# Patient Record
Sex: Male | Born: 1935 | Race: White | Hispanic: No | Smoking: Former smoker
Health system: Southern US, Community
[De-identification: ages and names within clinical notes are randomized; demographics above are authoritative.]

## PROBLEM LIST (undated history)

## (undated) DIAGNOSIS — F1020 Alcohol dependence, uncomplicated: Secondary | ICD-10-CM

## (undated) DIAGNOSIS — E785 Hyperlipidemia, unspecified: Secondary | ICD-10-CM

## (undated) DIAGNOSIS — G8929 Other chronic pain: Secondary | ICD-10-CM

## (undated) DIAGNOSIS — K469 Unspecified abdominal hernia without obstruction or gangrene: Secondary | ICD-10-CM

## (undated) DIAGNOSIS — Z720 Tobacco use: Secondary | ICD-10-CM

## (undated) DIAGNOSIS — Z8679 Personal history of other diseases of the circulatory system: Secondary | ICD-10-CM

## (undated) DIAGNOSIS — Z950 Presence of cardiac pacemaker: Secondary | ICD-10-CM

## (undated) DIAGNOSIS — I739 Peripheral vascular disease, unspecified: Secondary | ICD-10-CM

## (undated) DIAGNOSIS — Z9889 Other specified postprocedural states: Secondary | ICD-10-CM

## (undated) DIAGNOSIS — Q600 Renal agenesis, unilateral: Secondary | ICD-10-CM

## (undated) DIAGNOSIS — L409 Psoriasis, unspecified: Secondary | ICD-10-CM

## (undated) DIAGNOSIS — M419 Scoliosis, unspecified: Secondary | ICD-10-CM

## (undated) DIAGNOSIS — M545 Low back pain, unspecified: Secondary | ICD-10-CM

## (undated) DIAGNOSIS — I10 Essential (primary) hypertension: Secondary | ICD-10-CM

## (undated) DIAGNOSIS — I712 Thoracic aortic aneurysm, without rupture: Secondary | ICD-10-CM

## (undated) DIAGNOSIS — F0390 Unspecified dementia without behavioral disturbance: Secondary | ICD-10-CM

## (undated) DIAGNOSIS — M1990 Unspecified osteoarthritis, unspecified site: Secondary | ICD-10-CM

## (undated) DIAGNOSIS — I4891 Unspecified atrial fibrillation: Secondary | ICD-10-CM

## (undated) DIAGNOSIS — N400 Enlarged prostate without lower urinary tract symptoms: Secondary | ICD-10-CM

## (undated) DIAGNOSIS — K219 Gastro-esophageal reflux disease without esophagitis: Secondary | ICD-10-CM

## (undated) DIAGNOSIS — I480 Paroxysmal atrial fibrillation: Secondary | ICD-10-CM

## (undated) DIAGNOSIS — I495 Sick sinus syndrome: Secondary | ICD-10-CM

## (undated) DIAGNOSIS — K649 Unspecified hemorrhoids: Secondary | ICD-10-CM

## (undated) DIAGNOSIS — H3530 Unspecified macular degeneration: Secondary | ICD-10-CM

## (undated) DIAGNOSIS — I2510 Atherosclerotic heart disease of native coronary artery without angina pectoris: Secondary | ICD-10-CM

## (undated) DIAGNOSIS — J449 Chronic obstructive pulmonary disease, unspecified: Secondary | ICD-10-CM

## (undated) HISTORY — DX: Presence of cardiac pacemaker: Z95.0

## (undated) HISTORY — DX: Personal history of other diseases of the circulatory system: Z86.79

## (undated) HISTORY — DX: Peripheral vascular disease, unspecified: I73.9

## (undated) HISTORY — PX: CORONARY ANGIOPLASTY WITH STENT PLACEMENT: SHX49

## (undated) HISTORY — DX: Gastro-esophageal reflux disease without esophagitis: K21.9

## (undated) HISTORY — DX: Chronic obstructive pulmonary disease, unspecified: J44.9

## (undated) HISTORY — DX: Essential (primary) hypertension: I10

## (undated) HISTORY — DX: Scoliosis, unspecified: M41.9

## (undated) HISTORY — DX: Unspecified abdominal hernia without obstruction or gangrene: K46.9

## (undated) HISTORY — DX: Thoracic aortic aneurysm, without rupture: I71.2

## (undated) HISTORY — DX: Renal agenesis, unilateral: Q60.0

## (undated) HISTORY — DX: Tobacco use: Z72.0

## (undated) HISTORY — PX: HERNIA REPAIR: SHX51

## (undated) HISTORY — DX: Unspecified atrial fibrillation: I48.91

## (undated) HISTORY — DX: Unspecified macular degeneration: H35.30

## (undated) HISTORY — DX: Hyperlipidemia, unspecified: E78.5

## (undated) HISTORY — DX: Unspecified osteoarthritis, unspecified site: M19.90

## (undated) HISTORY — PX: APPENDECTOMY: SHX54

## (undated) HISTORY — DX: Personal history of other diseases of the circulatory system: Z98.890

## (undated) HISTORY — DX: Alcohol dependence, uncomplicated: F10.20

## (undated) HISTORY — DX: Unspecified hemorrhoids: K64.9

## (undated) HISTORY — PX: CATARACT EXTRACTION, BILATERAL: SHX1313

## (undated) HISTORY — DX: Atherosclerotic heart disease of native coronary artery without angina pectoris: I25.10

---

## 2000-11-24 ENCOUNTER — Ambulatory Visit (HOSPITAL_COMMUNITY): Admission: RE | Admit: 2000-11-24 | Discharge: 2000-11-24 | Payer: Self-pay | Admitting: *Deleted

## 2000-11-24 ENCOUNTER — Encounter: Payer: Self-pay | Admitting: *Deleted

## 2002-03-31 ENCOUNTER — Inpatient Hospital Stay (HOSPITAL_COMMUNITY): Admission: EM | Admit: 2002-03-31 | Discharge: 2002-04-06 | Payer: Self-pay

## 2002-03-31 ENCOUNTER — Encounter: Payer: Self-pay | Admitting: Family Medicine

## 2002-04-13 ENCOUNTER — Encounter: Admission: RE | Admit: 2002-04-13 | Discharge: 2002-04-13 | Payer: Self-pay | Admitting: Family Medicine

## 2002-05-03 ENCOUNTER — Inpatient Hospital Stay (HOSPITAL_COMMUNITY): Admission: RE | Admit: 2002-05-03 | Discharge: 2002-05-09 | Payer: Self-pay | Admitting: Vascular Surgery

## 2002-05-03 ENCOUNTER — Encounter: Payer: Self-pay | Admitting: Vascular Surgery

## 2002-05-03 HISTORY — PX: OTHER SURGICAL HISTORY: SHX169

## 2002-05-04 ENCOUNTER — Encounter: Payer: Self-pay | Admitting: Vascular Surgery

## 2002-05-06 ENCOUNTER — Encounter: Payer: Self-pay | Admitting: Vascular Surgery

## 2002-05-11 ENCOUNTER — Inpatient Hospital Stay (HOSPITAL_COMMUNITY): Admission: EM | Admit: 2002-05-11 | Discharge: 2002-05-12 | Payer: Self-pay | Admitting: Emergency Medicine

## 2002-05-11 ENCOUNTER — Encounter: Payer: Self-pay | Admitting: Emergency Medicine

## 2002-07-29 ENCOUNTER — Ambulatory Visit (HOSPITAL_COMMUNITY): Admission: RE | Admit: 2002-07-29 | Discharge: 2002-07-29 | Payer: Self-pay | Admitting: Family Medicine

## 2002-07-29 ENCOUNTER — Encounter: Payer: Self-pay | Admitting: Family Medicine

## 2003-03-03 ENCOUNTER — Encounter (INDEPENDENT_AMBULATORY_CARE_PROVIDER_SITE_OTHER): Payer: Self-pay | Admitting: *Deleted

## 2003-03-03 ENCOUNTER — Ambulatory Visit (HOSPITAL_COMMUNITY): Admission: RE | Admit: 2003-03-03 | Discharge: 2003-03-03 | Payer: Self-pay | Admitting: General Surgery

## 2003-03-03 ENCOUNTER — Ambulatory Visit (HOSPITAL_BASED_OUTPATIENT_CLINIC_OR_DEPARTMENT_OTHER): Admission: RE | Admit: 2003-03-03 | Discharge: 2003-03-03 | Payer: Self-pay | Admitting: General Surgery

## 2003-07-21 ENCOUNTER — Encounter
Admission: RE | Admit: 2003-07-21 | Discharge: 2003-07-21 | Payer: Self-pay | Admitting: Thoracic Surgery (Cardiothoracic Vascular Surgery)

## 2003-08-10 ENCOUNTER — Ambulatory Visit (HOSPITAL_COMMUNITY): Admission: RE | Admit: 2003-08-10 | Discharge: 2003-08-10 | Payer: Self-pay | Admitting: *Deleted

## 2003-09-19 ENCOUNTER — Ambulatory Visit (HOSPITAL_BASED_OUTPATIENT_CLINIC_OR_DEPARTMENT_OTHER): Admission: RE | Admit: 2003-09-19 | Discharge: 2003-09-19 | Payer: Self-pay | Admitting: General Surgery

## 2003-09-19 ENCOUNTER — Ambulatory Visit (HOSPITAL_COMMUNITY): Admission: RE | Admit: 2003-09-19 | Discharge: 2003-09-19 | Payer: Self-pay | Admitting: General Surgery

## 2003-09-19 ENCOUNTER — Encounter (INDEPENDENT_AMBULATORY_CARE_PROVIDER_SITE_OTHER): Payer: Self-pay | Admitting: *Deleted

## 2004-01-27 ENCOUNTER — Encounter
Admission: RE | Admit: 2004-01-27 | Discharge: 2004-01-27 | Payer: Self-pay | Admitting: Thoracic Surgery (Cardiothoracic Vascular Surgery)

## 2004-06-06 ENCOUNTER — Ambulatory Visit: Payer: Self-pay | Admitting: Internal Medicine

## 2004-06-21 ENCOUNTER — Ambulatory Visit: Payer: Self-pay | Admitting: Internal Medicine

## 2004-09-13 ENCOUNTER — Ambulatory Visit: Payer: Self-pay | Admitting: Internal Medicine

## 2004-09-20 ENCOUNTER — Ambulatory Visit: Payer: Self-pay

## 2004-12-11 ENCOUNTER — Ambulatory Visit: Payer: Self-pay | Admitting: Internal Medicine

## 2004-12-18 ENCOUNTER — Ambulatory Visit: Payer: Self-pay | Admitting: Internal Medicine

## 2005-01-18 ENCOUNTER — Ambulatory Visit: Payer: Self-pay | Admitting: Internal Medicine

## 2005-01-31 ENCOUNTER — Encounter
Admission: RE | Admit: 2005-01-31 | Discharge: 2005-01-31 | Payer: Self-pay | Admitting: Thoracic Surgery (Cardiothoracic Vascular Surgery)

## 2005-05-07 ENCOUNTER — Ambulatory Visit: Payer: Self-pay | Admitting: Cardiovascular Disease

## 2005-06-07 ENCOUNTER — Ambulatory Visit: Payer: Self-pay | Admitting: Cardiology

## 2005-06-10 ENCOUNTER — Encounter: Payer: Self-pay | Admitting: Cardiology

## 2005-06-10 ENCOUNTER — Ambulatory Visit: Payer: Self-pay

## 2005-06-17 ENCOUNTER — Ambulatory Visit: Payer: Self-pay | Admitting: Internal Medicine

## 2005-10-24 ENCOUNTER — Ambulatory Visit: Payer: Self-pay | Admitting: Internal Medicine

## 2005-10-28 ENCOUNTER — Ambulatory Visit: Admission: RE | Admit: 2005-10-28 | Discharge: 2005-10-28 | Payer: Self-pay | Admitting: Internal Medicine

## 2005-11-22 ENCOUNTER — Ambulatory Visit: Payer: Self-pay | Admitting: Internal Medicine

## 2005-12-13 ENCOUNTER — Ambulatory Visit: Payer: Self-pay | Admitting: Internal Medicine

## 2006-02-06 ENCOUNTER — Ambulatory Visit: Payer: Self-pay | Admitting: Internal Medicine

## 2006-02-25 ENCOUNTER — Encounter
Admission: RE | Admit: 2006-02-25 | Discharge: 2006-02-25 | Payer: Self-pay | Admitting: Thoracic Surgery (Cardiothoracic Vascular Surgery)

## 2006-04-30 ENCOUNTER — Ambulatory Visit: Payer: Self-pay | Admitting: Cardiovascular Disease

## 2006-04-30 ENCOUNTER — Inpatient Hospital Stay (HOSPITAL_COMMUNITY): Admission: RE | Admit: 2006-04-30 | Discharge: 2006-05-05 | Payer: Self-pay | Admitting: General Surgery

## 2006-07-21 ENCOUNTER — Ambulatory Visit: Payer: Self-pay | Admitting: Internal Medicine

## 2007-03-02 ENCOUNTER — Ambulatory Visit: Payer: Self-pay | Admitting: Thoracic Surgery (Cardiothoracic Vascular Surgery)

## 2007-03-02 ENCOUNTER — Encounter
Admission: RE | Admit: 2007-03-02 | Discharge: 2007-03-02 | Payer: Self-pay | Admitting: Thoracic Surgery (Cardiothoracic Vascular Surgery)

## 2007-03-05 ENCOUNTER — Ambulatory Visit: Payer: Self-pay | Admitting: Internal Medicine

## 2007-09-07 ENCOUNTER — Ambulatory Visit: Payer: Self-pay | Admitting: Internal Medicine

## 2008-03-17 ENCOUNTER — Encounter
Admission: RE | Admit: 2008-03-17 | Discharge: 2008-03-17 | Payer: Self-pay | Admitting: Thoracic Surgery (Cardiothoracic Vascular Surgery)

## 2008-03-17 ENCOUNTER — Ambulatory Visit: Payer: Self-pay | Admitting: Thoracic Surgery (Cardiothoracic Vascular Surgery)

## 2008-04-18 DIAGNOSIS — K219 Gastro-esophageal reflux disease without esophagitis: Secondary | ICD-10-CM | POA: Insufficient documentation

## 2008-04-18 DIAGNOSIS — D126 Benign neoplasm of colon, unspecified: Secondary | ICD-10-CM

## 2008-04-18 DIAGNOSIS — I2510 Atherosclerotic heart disease of native coronary artery without angina pectoris: Secondary | ICD-10-CM

## 2008-04-18 DIAGNOSIS — I739 Peripheral vascular disease, unspecified: Secondary | ICD-10-CM | POA: Insufficient documentation

## 2008-04-18 DIAGNOSIS — K5730 Diverticulosis of large intestine without perforation or abscess without bleeding: Secondary | ICD-10-CM | POA: Insufficient documentation

## 2008-04-18 DIAGNOSIS — I10 Essential (primary) hypertension: Secondary | ICD-10-CM | POA: Insufficient documentation

## 2008-04-18 DIAGNOSIS — I714 Abdominal aortic aneurysm, without rupture: Secondary | ICD-10-CM

## 2008-04-19 ENCOUNTER — Ambulatory Visit: Payer: Self-pay | Admitting: Internal Medicine

## 2008-04-19 DIAGNOSIS — Z860100 Personal history of colon polyps, unspecified: Secondary | ICD-10-CM | POA: Insufficient documentation

## 2008-04-27 ENCOUNTER — Ambulatory Visit: Payer: Self-pay | Admitting: Internal Medicine

## 2008-05-19 ENCOUNTER — Ambulatory Visit: Payer: Self-pay | Admitting: Internal Medicine

## 2009-03-24 ENCOUNTER — Ambulatory Visit: Payer: Self-pay | Admitting: Internal Medicine

## 2009-03-28 ENCOUNTER — Ambulatory Visit: Payer: Self-pay | Admitting: Thoracic Surgery (Cardiothoracic Vascular Surgery)

## 2009-03-28 ENCOUNTER — Encounter
Admission: RE | Admit: 2009-03-28 | Discharge: 2009-03-28 | Payer: Self-pay | Admitting: Thoracic Surgery (Cardiothoracic Vascular Surgery)

## 2009-03-28 ENCOUNTER — Encounter: Payer: Self-pay | Admitting: Internal Medicine

## 2009-05-01 ENCOUNTER — Encounter: Payer: Self-pay | Admitting: Internal Medicine

## 2009-08-18 ENCOUNTER — Ambulatory Visit: Payer: Self-pay | Admitting: Internal Medicine

## 2009-10-27 ENCOUNTER — Telehealth: Payer: Self-pay | Admitting: Internal Medicine

## 2009-10-27 ENCOUNTER — Ambulatory Visit: Payer: Self-pay | Admitting: Internal Medicine

## 2009-10-27 DIAGNOSIS — R079 Chest pain, unspecified: Secondary | ICD-10-CM | POA: Insufficient documentation

## 2010-03-26 ENCOUNTER — Ambulatory Visit: Payer: Self-pay | Admitting: Thoracic Surgery (Cardiothoracic Vascular Surgery)

## 2010-03-26 ENCOUNTER — Encounter: Payer: Self-pay | Admitting: Internal Medicine

## 2010-03-26 ENCOUNTER — Encounter
Admission: RE | Admit: 2010-03-26 | Discharge: 2010-03-26 | Payer: Self-pay | Admitting: Thoracic Surgery (Cardiothoracic Vascular Surgery)

## 2010-06-19 NOTE — Assessment & Plan Note (Signed)
Summary: per check out/sf   Primary Provider:  Rudi Heap, MD  CC:  ROV; No Complaints.  History of Present Illness: patient is an 77 year old with a history of CAD (cath in 2006:  70 to 80% D1 and D2; 50% LCx; 50 to 60% RCA); dyslipidemia, DJD, PVOD (aortic aneurysm)  and COPD.  I saw him in November. Since seen he denies significan chest pain.  Breathing is unchanged, he does give out with activity. When I saw him last he was complaining of pain in legs with standing and low back pain.  He does not have this problem with walking.  Problems Prior to Update: 1)  Cad  (ICD-414.00) 2)  Aaa  (ICD-441.4) 3)  Dyslipidemia  (ICD-272.4) 4)  Hypertension  (ICD-401.9) 5)  Pvd  (ICD-443.9) 6)  Personal Hx Colonic Polyps  (ICD-V12.72) 7)  Adenomatous Colonic Polyp  (ICD-211.3) 8)  Diverticulosis, Colon  (ICD-562.10) 9)  Gerd  (ICD-530.81)  Current Medications (verified): 1)  Metoprolol Tartrate 25 Mg Tabs (Metoprolol Tartrate) .Marland Kitchen.. 1 Tablet By Mouth Once Daily 2)  Vytorin 10-40 Mg Tabs (Ezetimibe-Simvastatin) .Marland Kitchen.. 1 Tablet By Mouth Once Daily 3)  Furosemide 20 Mg Tabs (Furosemide) .Marland Kitchen.. 1 Tablet By Mouth Two Times A Day 4)  Amlodipine Besylate 5 Mg Tabs (Amlodipine Besylate) .Marland Kitchen.. 1 Tablet By Mouth Once Daily 5)  Klor-Con M20 20 Meq Cr-Tabs (Potassium Chloride Crys Cr) .Marland Kitchen.. 1 Tablet By Mouth Once Daily 6)  Plavix 75 Mg Tabs (Clopidogrel Bisulfate) .Marland Kitchen.. 1 Tablet By Mouth Once Daily 7)  Fish Oil 1000 Mg Caps (Omega-3 Fatty Acids) .Marland Kitchen.. 1 Tablet By Mouth Once Daily 8)  Isosorbide Mononitrate Cr 30 Mg Xr24h-Tab (Isosorbide Mononitrate) .... Take One Tablet By Mouth Daily 9)  Preservision Areds  Caps (Multiple Vitamins-Minerals) .... Take 1 By Mouth Two Times A Day  Allergies: 1)  ! Asa 2)  ! Keflex 3)  ! Lipitor  Past History:  Past medical, surgical, family and social histories (including risk factors) reviewed, and no changes noted (except as noted below).  Past Medical  History: Reviewed history from 04/18/2008 and no changes required. Current Problems:  ADENOMATOUS COLONIC POLYP (ICD-211.3) PVD (ICD-443.9) DYSLIPIDEMIA (ICD-272.4) HYPERTENSION (ICD-401.9) AAA (ICD-441.4) CAD (ICD-414.00) DIVERTICULOSIS, COLON (ICD-562.10) GERD (ICD-530.81)  Past Surgical History: Reviewed history from 04/19/2008 and no changes required. AAA repair Appendectomy  Family History: Reviewed history from 05/27/2008 and no changes required. Cerebrovascular dz in parents  Social History: Reviewed history from 04/19/2008 and no changes required. Married  Retired Patient is a former smoker.  Alcohol Use - no Daily Caffeine Use Illicit Drug Use - no  Vital Signs:  Patient profile:   77 year old male Height:      68 inches Weight:      208.75 pounds BMI:     31.86 Pulse rate:   53 / minute BP sitting:   138 / 76  (left arm)  Vitals Entered By: Stanton Kidney, EMT-P (August 18, 2009 9:36 AM)  Physical Exam  Additional Exam:  Patient is in NAD HEENT:  Normocephalic, atraumatic. EOMI, PERRLA.  Neck: JVP is normal. No thyromegaly. No bruits.  Lungs: clear to auscultation. No rales no wheezes.  Heart: Regular rate and rhythm. Normal S1, S2. No S3.   No significant murmurs. PMI not displaced.  Abdomen:  Supple, nontender. Normal bowel sounds. No masses. No hepatomegaly.  Extremities:   Good distal pulses throughout. No lower extremity edema.  Musculoskeletal :moving all extremities.  Neuro:   alert  and oriented x3.    Impression & Recommendations:  Problem # 1:  CAD (ICD-414.00) No symp to suggest worsing ischemia.  He hs chronic DOE, I am not impressed that it has changed.  I would continue meds.  Problem # 2:  AAA (ICD-441.4) Followed by S. Hendrickson.  Problem # 3:  DYSLIPIDEMIA (ICD-272.4) Excellent control with LDL of 56; HDL of 52. His updated medication list for this problem includes:    Vytorin 10-40 Mg Tabs (Ezetimibe-simvastatin) .Marland Kitchen... 1  tablet by mouth once daily  Problem # 4:  HYPERTENSION (ICD-401.9) Adequate control His updated medication list for this problem includes:    Metoprolol Tartrate 25 Mg Tabs (Metoprolol tartrate) .Marland Kitchen... 1 tablet by mouth once daily    Furosemide 20 Mg Tabs (Furosemide) .Marland Kitchen... 1 tablet by mouth two times a day    Amlodipine Besylate 5 Mg Tabs (Amlodipine besylate) .Marland Kitchen... 1 tablet by mouth once daily

## 2010-06-19 NOTE — Assessment & Plan Note (Signed)
Summary: Right upper chest/ shoulder and arm pain      Allergies Added:   Visit Type:  Follow-up Primary Provider:  Rudi Heap, MD  CC:  chest pain sob.  History of Present Illness: patient is an 77 year old with a history of CAD (cath in 2006:  70 to 80% D1 and D2; 50% LCx; 50 to 60% RCA); dyslipidemia, DJD, PVOD (aortic aneurysm)  and COPD.  I saw him in April of this year. The patient called in earlier today complaining of chest pain.  Per his report the pain is in the R chest anterior to axillar aand under axilla.  It is occasionally exacerbated by breathing.  He also notes some exacerbation wth movement.  No left sided chest pain.  Pain as gone on/off for several days. In addition the patient notes some numbens in the first three fingers of his R hand.  No neuro complaints elsewhere.  Current Medications (verified): 1)  Metoprolol Tartrate 25 Mg Tabs (Metoprolol Tartrate) .Marland Kitchen.. 1 Tablet By Mouth Once Daily 2)  Vytorin 10-40 Mg Tabs (Ezetimibe-Simvastatin) .Marland Kitchen.. 1 Tablet By Mouth Once Daily 3)  Furosemide 20 Mg Tabs (Furosemide) .Marland Kitchen.. 1 Tablet By Mouth Two Times A Day 4)  Amlodipine Besylate 5 Mg Tabs (Amlodipine Besylate) .Marland Kitchen.. 1 Tablet By Mouth Once Daily 5)  Klor-Con M20 20 Meq Cr-Tabs (Potassium Chloride Crys Cr) .Marland Kitchen.. 1 Tablet By Mouth Once Daily 6)  Plavix 75 Mg Tabs (Clopidogrel Bisulfate) .Marland Kitchen.. 1 Tablet By Mouth Once Daily 7)  Fish Oil 1000 Mg Caps (Omega-3 Fatty Acids) .Marland Kitchen.. 1 Tablet By Mouth Once Daily 8)  Isosorbide Mononitrate Cr 30 Mg Xr24h-Tab (Isosorbide Mononitrate) .... Take One Tablet By Mouth Daily 9)  Preservision Areds  Caps (Multiple Vitamins-Minerals) .... Take 1 By Mouth Two Times A Day  Allergies (verified): 1)  ! Asa 2)  ! Keflex 3)  ! Lipitor  Past History:  Past medical, surgical, family and social histories (including risk factors) reviewed, and no changes noted (except as noted below).  Past Medical History: Reviewed history from 04/18/2008 and no  changes required. Current Problems:  ADENOMATOUS COLONIC POLYP (ICD-211.3) PVD (ICD-443.9) DYSLIPIDEMIA (ICD-272.4) HYPERTENSION (ICD-401.9) AAA (ICD-441.4) CAD (ICD-414.00) DIVERTICULOSIS, COLON (ICD-562.10) GERD (ICD-530.81)  Past Surgical History: Reviewed history from 04/19/2008 and no changes required. AAA repair Appendectomy  Family History: Reviewed history from 05/27/2008 and no changes required. Cerebrovascular dz in parents  Social History: Reviewed history from 04/19/2008 and no changes required. Married  Retired Patient is a former smoker.  Alcohol Use - no Daily Caffeine Use Illicit Drug Use - no  Vital Signs:  Patient profile:   77 year old male Height:      68 inches Weight:      208 pounds Pulse rate:   64 / minute BP sitting:   123 / 67  (left arm) Cuff size:   regular  Vitals Entered By: Burnett Kanaris, CNA (October 27, 2009 11:05 AM)  Physical Exam  Additional Exam:  patient is in NAD HEENT:  Normocephalic, atraumatic. EOMI, PERRLA.  Neck: JVP is normal. No thyromegaly. No bruits.  Lungs: clear to auscultation. No rales no wheezes.  Heart: Regular rate and rhythm. Normal S1, S2. No S3.   No significant murmurs. PMI not displaced. Chest:  tneder palpitation .  Pain in right shoulder with movement.  Abdomen:  Supple, nontender. Normal bowel sounds. No masses. No hepatomegaly.  Extremities:   Good distal pulses throughout. No lower extremity edema.  Musculoskeletal :moving all  extremities.  Neuro:   alert and oriented x3.  Motor 5/5 throughout.  Decreased pinprick senstation first three fingers  R hand.   Rapid alternating movements intact.    Impression & Recommendations:  Problem # 1:  CHEST PAIN UNSPECIFIED (ICD-786.50) Patient's current chest pain appears to be musculoskeletal.  He may have strained in doing something. R hand numbness appears to be a peripheral defect, again may be associated with R shoulder injury. I recommended rest,  activities as tolerated. Chest xray today is without acute abnormality If his symptoms do not improve I have told him to call back in 2 wks.  Problem # 2:  CAD (ICD-414.00) No signs to sugg active angina.  Continue meds  Problem # 3:  DYSLIPIDEMIA (ICD-272.4) Continue meds. His updated medication list for this problem includes:    Vytorin 10-40 Mg Tabs (Ezetimibe-simvastatin) .Marland Kitchen... 1 tablet by mouth once daily  Other Orders: EKG w/ Interpretation (93000) T-2 View CXR (71020TC)  Patient Instructions: 1)  Your physician recommends that you schedule a follow-up appointment in:  2)  Your MD recommends for you to have a Chest xray today.  Appended Document: Right upper chest/ shoulder and arm pain EKG:  Sinus bradycardia.  57 bpm.  LAD.

## 2010-06-19 NOTE — Progress Notes (Signed)
Summary: pt having chest pain   Phone Note Call from Patient Call back at Home Phone [phone]   Caller: Patient Reason for Call: Talk to Nurse, Talk to Doctor Summary of Call: pt having chest pain and numbness on right side goes down to fingers no other symptoms Initial call taken by: Omer Jack,  October 27, 2009 8:06 AM  Follow-up for Phone Call        Spoke with pt. Patient c/o of numbnes tingling and pain on right upper chest shoulder down to arm. Pt. states he feels the pain with esch breath. Pt. states this been going on for a week. He would like to be seen today. An appointment was made for pt. at 11:45AM pt aware. Follow-up by: Ollen Gross, RN, BSN,  October 27, 2009 8:29 AM

## 2010-06-22 ENCOUNTER — Encounter: Payer: Self-pay | Admitting: Internal Medicine

## 2010-07-11 NOTE — Progress Notes (Signed)
Summary: [facility]: Office Visit  [facility]: Office Visit   Imported By: Earl Many 06/28/2010 17:19:27  _____________________________________________________________________  External Attachment:    Type:   Image     Comment:   External Document

## 2010-10-02 NOTE — Assessment & Plan Note (Signed)
OFFICE VISIT   BRITTEN, SEYFRIED  DOB:  01/11/1928                                        March 28, 2009  CHART #:  [id number]   HISTORY:  The patient is a 77 year old gentleman who returns for a 1-  year followup of his ascending aortic aneurysm.  This was found back in  2003.  He had an infrarenal and common iliac aneurysm repair by Dr.  Hart Rochester.  Chest x-ray showed a wide mediastinum.  A CT scan showed a 4.8-  cm aneurysm.  He has been followed with annual CT since then.  His most  recent one was in October 2009, at which point there was no interval  change.  It measured 5 x 4.5 at its largest point, although that may be  slightly off axis and slightly overestimating the size, it is certainly  within range.  He states that he did see Dr. [name] recently because he  had been having some swelling in his legs but that is now improved.  He  has not been having any chest pain.  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  His wife is having some significant health problems which is putting  some strain on him.   CURRENT MEDICATIONS:  1. Isosorbide 30 mg daily.  2. Amlodipine 5 mg daily.  3. Metoprolol 25 mg daily.  4. Plavix 75 mg daily.  5. Furosemide 20 mg daily.  6. Vytorin 10/40 one tablet daily.  7. TriCor 145 mg daily.  8. Klor-Con 20 mEq daily.   ALLERGIES:  He has allergies to aspirin and Keflex.   REVIEW OF SYSTEMS:  See HPI, otherwise negative.   PHYSICAL EXAMINATION:  General:  The patient is a 77 year old gentleman  in no acute distress.  Vital Signs:  Blood pressure 140/86, pulse 62,  respirations are 18, his oxygen saturation is 94% on room air.  Neurologic:  He is alert and oriented x3 with no focal deficits.  HEENT:  He is wearing glasses.  Otherwise, unremarkable.  Neck:  Without bruits.  Cardiac:  A regular rate and rhythm.  Normal S1 and S2.  There is a 2/6  systolic  murmur.  Lungs:  Equal breath sounds.  Abdomen:  Soft,  nontender without palpable pulsatile mass.  Extremities:  Trace edema.   DIAGNOSTIC TESTS:  His CT scan is stable.  There has been no change in  his aortic dilatation and atherosclerosis.  He has stable chronic lung  disease with no suspicious findings.   IMPRESSION:  The patient is a 77 year old gentleman followed for an  ascending aortic aneurysm.  He has had no evidence of progression with  this over a 7-year period.  We will continue to follow him with annual  scans.  No medication changes were made at this visit.    Salvatore Decent Dorris Fetch, M.D.  Electronically Signed   SCH/MEDQ  D:  03/28/2009  T:  03/28/2009  Job:  [id number]   cc:   [name], MD, [facility]  Ernestina Penna, M.D.  Quita Skye Hart Rochester, M.D.

## 2010-10-02 NOTE — Assessment & Plan Note (Signed)
[facility]                            CARDIOLOGY OFFICE NOTE   [name], Brad                      MRN:          [id number]  DATE:03/05/2007                            DOB:          10-23-1927    IDENTIFICATION:  Brad [name] is a 77 year old gentleman who I have  followed in clinic.  He has a history of CAD.  Last cardiac  catheterization in 2006 (50% LAD, 70-85% D1, 70-80% D2, 50% circumflex,  50-60% RCA).  Myoview at that time showed no evidence of ischemia or  scar.   I last saw the patient back in March.  In the interval, he says he has  been doing okay.  He is followed by Lindaann Pascal in the clinic.  He denies  chest pain.  Breathing is relatively unchanged.  He takes activities as  tolerated.   CURRENT MEDICATIONS:  1. Metoprolol 12.5 mg daily.  2. Vytorin 10/20 daily.  3. Furosemide 20 b.i.d.   PHYSICAL EXAMINATION:  Patient is in no distress.  Blood pressure is 126/78.  Pulse is 58.  Weight 213.  DICTATION ENDS  HERE.     [name], MD, [facility]  Electronically Signed    PVR/MedQ  DD: 03/05/2007  DT: 03/06/2007  Job #: [id number]

## 2010-10-02 NOTE — Assessment & Plan Note (Signed)
[facility]                            CARDIOLOGY OFFICE NOTE   Brad [name], Brad [name]                      MRN:          [id number]  DATE:03/05/2007                            DOB:          [birth year]    IDENTIFICATION:  The patient is a 77 year old with a history of CAD,  dyslipidemia, aortic aneurysm, COPD.  I last saw him back in March.   Since being seen he has been doing pretty well.  He takes activity as  tolerated.  He has some lower back problems and cannot walk as well.  He  was seen this week by Viviann Spare C. Dorris Fetch, M.D. for followup with his  ascending aortic aneurysm.  CT scan shows that this has not changed  significantly at 4.6 x 4.6-cm.   The patient denies chest pain, breathing is actually somewhat better  since he quit smoking.  He says he thinks the incentive spirometer that  he got post abdominal surgery has actually helped the most.   Note, he is being evaluated for cataract surgery by Dr. Lorin Picket and Dr.  Sheffield Slider.  This is planned in November.   CURRENT MEDICATIONS:  1. Metoprolol 12.5 daily.  2. Vytorin 10/20.  3. Lasix 20 b.i.d.  4. Plavix 75.  5. Isorbid 30.  6. Amlodipine 5.  7. Fish oil 1 gram.  8. Vitamin C.  9. TriCor 145.  10.Potassium.  11.Spiriva.   PHYSICAL EXAMINATION:  GENERAL:  The patient is in no distress.  VITAL SIGNS:  His blood pressure is 126/78, pulse is 58 and regular,  weight 213 - stable.  LUNGS:  Clear.  CARDIAC:  Regular rate and rhythm.  S1 S2.  No S3.  No significant  murmur.  ABDOMEN:  Obese.  Ventral hernia reduces.  EXTREMITIES:  No edema.   IMPRESSION:  1. Coronary artery disease.  He remains on Plavix because of his      aspirin allergy.  On cath in 2006, 50% left anterior descending      artery, 75-85% diagonal-1, 70-80% diagonal-2, 50% circumflex, 50-      60% right coronary artery.  A Myoview scan done in May 2006, showed      no ischemia.  Clinically the patient has been fairly  stable.  I do      not think there is any significant worsening in his coronary artery      disease and I think from a cardiac standpoint, he is at a low risk      for a major event with cataract surgery, okay to proceed without      further testing.  2. Dyslipidemia.  We will need to get in touch with [facility]      regarding his lipid values.  It has been followed up in there. We      will also see if he has had a recent B-MET given that he is on the      Lasix.  3. Peripheral vascular occlusive disease.  As per Dr. Dorris Fetch.  4. Chronic obstructive pulmonary disease.  I congratulate him again  for the smoking cessation.   I have encouraged him to stay as active as he.  He plans to start  fishing more.  I will set to see him back in 6 months.     [name], MD, [facility]  Electronically Signed    PVR/MedQ  DD: 03/05/2007  DT: 03/05/2007  Job #: [id number]   cc:   Lindaann Pascal  Dr. Lorin Picket, ophthalmologist  Allen Norris, M.D.

## 2010-10-02 NOTE — Assessment & Plan Note (Signed)
Brad [name]                            CARDIOLOGY OFFICE NOTE   Brad [name], Brad [name]                      MRN:          [id number]  DATE:05/19/2008                            DOB:          Sep 15, 1927    IDENTIFICATION:  [name] is a 77 year old gentleman who I followed  in the clinic, history of CAD, dyslipidemia, peripheral vascular  disease, and COPD.   He was last seen in April of this year.  Since seen, he has actually  been doing well.  He denies chest pain.  No significant shortness of  breath.   CURRENT MEDICATIONS:  1. Lasix 20 b.i.d.  2. Amlodipine 5 daily.  3. TriCor 145 daily.  4. Metoprolol 25 daily.  5. Vytorin 10/40 daily.  6. Klor-Con 20 mEq.  7. Plavix 75 daily.  8. Fish oil.  9. Vitamin C.   PHYSICAL EXAMINATION:  GENERAL:  The patient is in no distress.  VITAL SIGNS:  Blood pressure is 124/80, pulse is 58 and regular, weight  is 212 down from 215.  NECK:  No JVD.  LUNGS:  Clear without rales.  CARDIAC:  Regular rate and rhythm.  S1 and S2.  No murmurs.  ABDOMEN:  Benign, obese, status post ventral hernia repair.  EXTREMITIES:  No edema.   A 12-lead EKG shows sinus bradycardia at 58 beats per minute with  occasional PACs.  Left anterior fascicular block.   Labs from May 04, 2008, LipoMed panel, LDL of 74 with particle  number of 1147, HDL 42, triglycerides of 39, and total cholesterol 124.   IMPRESSION:  1. Coronary artery disease.  Last catheterization in 2006 (70-85% D1;      70-80% D2; 50% circumflex; 50-60% right coronary artery).  Myoview      scan showed no ischemia.  Remaining stable clinically.  We would      continue on medical therapy.  2. Dyslipidemia.  I have based on his lipids recommended that he stop      the TriCor.  We will follow up in 3 months.  3. Peripheral vascular disease followed by Dr. Dorris Fetch.  4. History of chronic obstructive pulmonary disease, appears to be      doing  fairly well.   I will set to see the patient back in the fall, sooner if problems  develop.  I will be in touch with him regarding his blood work.     [name], MD, [facility]  Electronically Signed    PVR/MedQ  DD: 05/21/2008  DT: 05/22/2008  Job #: [id number]   cc:   [facility]

## 2010-10-02 NOTE — Assessment & Plan Note (Signed)
OFFICE VISIT   Brad [name], Brad [name]  DOB:  1928/05/18                                        March 26, 2010  CHART #:  [id number]   HISTORY:  The patient is an 77 year old gentleman who we have been  following for an ascending aortic aneurysm, been seeing him yearly since  it was first discovered in 2003.  He was last seen in the office last  November, at which time there had been no interval change in his  aneurysm.  He states that overall he has been doing well.  He has not  had any real chest pain.  He has been having trouble.  He gets a little  winded when he tries to wait, so he has not been able to fish as much,  but overall he says he has been fairly stable.  He saw Dr. [name] back in  the summer and said that she did not have any concerns at that time.   His son does note that he felt a dizzy after his CT scan today but that  he has not been having any issues with dizziness prior to that scan.   PAST MEDICAL HISTORY:  Significant for hypertension ascending aortic  aneurysm, chronic obstructive pulmonary disease, history of tobacco  abuse, repair of abdominal and right common iliac aneurysm by Dr. Hart Rochester  in 2005.   CURRENT MEDICATIONS:  1. Imdur 30 mg daily.  2. Metoprolol 25 mg daily.  3. Plavix 75 mg daily.  4. Furosemide 20 mg daily.  5. Klor-Con 20 mEq daily.  6. PreserVision.  7. Vitamin one daily.  8. Crestor 10 mg daily.  9. Vitamin D3 1000 units daily.  10.Fish oil 1200 mg daily.   ALLERGIES:  He has allergies to aspirin and Keflex.   REVIEW OF SYSTEMS:  See HPI, otherwise negative.   PHYSICAL EXAMINATION:  The patient is an 77 year old gentleman in no  acute distress.  He is overweight.  His blood pressure is 116/70, pulse  70, respirations are 16, oxygen saturation is 93% on room air.  Neurologically, he is alert and oriented x3 with no focal deficits.  His  HEENT exam is unremarkable.  Neck is without bruits.  His lungs have  diminished breath sounds bilaterally.  Cardiac exam has a regular rate  and rhythm.  There is a 2/6 systolic murmur.  No rubs or gallops.   DIAGNOSTIC TESTS:  CT of the chest was performed and has not been  officially read yet.  The ascending aortic aneurysm appears relatively  stable.  It could possibly be enlarged by a millimeter or two but still  remains less than 5 cm.   IMPRESSION:  The patient is an 77 year old gentleman with an ascending  aortic aneurysm.  This has been followed for about 8 years now.  There  is no evidence of significant enlargement and no indication for surgery.  At this time, I would recommend repeat imaging in 1 year.  I did  recommend to do MR angio instead a CT angio, but the patient prefers to  continue with CT scans.   Salvatore Decent Dorris Fetch, M.D.  Electronically Signed   SCH/MEDQ  D:  03/26/2010  T:  03/27/2010  Job:  [id number]   cc:   [name], MD, [facility]

## 2010-10-02 NOTE — Assessment & Plan Note (Signed)
OFFICE VISIT   ORPHEUS, HAYHURST  DOB:  [birth year]                                        March 17, 2008  CHART #:  [id number]   The patient is a 77 year old gentleman.  He returns now for his 1-year  annual followup of an aneurysmal ascending aorta.  This was 4.8 cm in  2003.  He was having a repair of an infrarenal abdominal aneurysm and  common iliac aneurysm by Dr. Hart Rochester.  We have been following him with  scans since that time and it has remained stable since.  The patient  states that since his last visit, his breathing is about the same as his  back pain.  He still enjoys fly fishing on a regular basis that is about  the extent of his activities, but that has been that way for several  years.  He has been feeling well.  He has not been having any chest pain  or any unusual breathing symptoms.   CURRENT MEDICATIONS:  Isosorbide 30 mg daily, amlodipine 5 mg daily,  metoprolol 25 mg daily, Plavix 75 mg daily, Lasix 20 mg daily, Vytorin  10/40 one tablet daily, TriCor 145 mg daily, and Klor-Con 20 mEq daily.   ALLERGIES:  He has allergies to aspirin and Keflex.   The past medical history, review of systems, family and social history  reviewed on the patient's medical record form, which is on the chart.   PHYSICAL EXAMINATION:  GENERAL:  The patient is a well-appearing 80-year-  old white male, in no acute distress.  VITAL SIGNS:  His blood pressure is 128/78, pulse 67, respirations 20,  and his oxygen saturation 94% on room air.  No further physical exam was performed as I am recovering from a  respiratory illness.   CT scan is reviewed and compared to his film.  From the last several  years, there has been no interval change in the appearance of his  ascending aorta still approximately 5 cm in largest diameter of 5 x 4.5  at the largest point, which is unchanged from a year ago.   IMPRESSION:  The patient has a stable aneurysmal ascending aorta,  this  is unchanged since 2003.  There is no indication for surgery at this  time.  There is a risk of surgery essentially equivalent to his risk of  continued followup.  We will plan to see the patient back in 1 year to  check on his status at that time.  Obviously, we would be happy to see  him sooner, if there is any need and he knows to contact the EMS  immediately, if he were to have any severe chest pain.   Salvatore Decent Dorris Fetch, M.D.  Electronically Signed   SCH/MEDQ  D:  03/17/2008  T:  03/17/2008  Job:  [id number]   cc:   [name], MD, [facility]  Ernestina Penna, M.D.

## 2010-10-02 NOTE — Assessment & Plan Note (Signed)
[facility]                            CARDIOLOGY OFFICE NOTE   MORGON, PAMER                      MRN:          [id number]  DATE:09/07/2007                            DOB:          20-Mar-1928    IDENTIFICATION:  Brad [name] is a 77 year old gentleman with a history  of coronary artery disease.  I last saw him in clinic back in October.   Since seen, he has done well.  He actually says it is the best he has  felt in two years.  He denies chest pain.  He has been active, fishing,  no significant shortness of breath.   CURRENT MEDICINES:  1. Lasix 20 twice a day.  2. Amlodipine 5.  3. Tricor 145 alternating with Triplix when he can get samples.  4. Metoprolol 25.  5. Vytorin 10/40.  6. Klor-Con 20.  7. Plavix 75.  8. Fish oil.  9. Red yeast rice.  10.Vitamin C.   PHYSICAL EXAMINATION:  GENERAL APPEARANCE:  The patient is in no  distress.  VITAL SIGNS:  Blood pressure is 114/70, pulse is 56 and regular, weight  215 which is up 2 pounds from October.  LUNGS:  Clear.  CARDIAC:  Regular rate rhythm, S1, S2, no S3, no significant murmurs.  ABDOMEN:  Benign, obese with ventral hernia repair, still some bulging.  EXTREMITIES:  No edema.   IMPRESSION:  1. Coronary artery disease, clinically appears to be stable.  Last      catheterization in 2006 showed 75-85 first diagonal, 70-80% second      diagonal, 50% circumflex, 50-60% right coronary artery.  Myoview      showed no ischemia.  Again, he has remained stable clinically.      would continue medical therapy.  2. Dyslipidemia.  Last labs from earlier this year, LDL of 64, HDL of      45.  Would continue on current regimen.  Triglycerides of 45.  3. Peripheral vascular disease.  Followed by Dr. Dorris Fetch.  4. History of chronic obstructive pulmonary disease.  Appears to be      stable.   I will set to see the patient back in the winter, December or January,  sooner if problems  develop.     [name], MD, [facility]  Electronically Signed    PVR/MedQ  DD: 09/07/2007  DT: 09/07/2007  Job #: [id number]   cc:   [facility]

## 2010-10-02 NOTE — Assessment & Plan Note (Signed)
OFFICE VISIT   Brad [name], Brad [name]  DOB:  1928-02-02                                        March 02, 2007  CHART #:  [id number]   HISTORY:  The patient is a 77 year old gentleman who presents for a 1-  year followup of an aneurysmal ascending aorta.  He has had previous  treatment of infrarenal abdominal aneurysm and common iliac aneurysm by  Dr. Hart [name] in 2003.  At that time, his ascending aorta was noted to be  enlarged with a diameter of approximately 4.8-cm.  He had been followed  with serial repeat CT scans.  He was last seen in October 2007, at which  time the aneurysm remained stable.   In the interim since his last visit, he has had a ventral hernia repair  by Dr. Carolynne [name].  And, he says that after that he quit smoking.  He also  quit drinking.  He said quitting the smoking has been much more  difficult and he has gained some weight since then.  His exercise is  still limited.  He has significant back pain.  He still fishes on a  regular basis.  He says he cannot wade as far as he used to or cast as  far as he used to be able to but still enjoys it.   The patient's medical history form is reviewed and is on the chart.  His  past medical history is noted there.   CURRENT MEDICATIONS:  1. Lasix 20 mg b.i.d.  2. Imdur 30 mg daily.  3. Toprol XL 12.5 mg daily.  4. Plavix 75 mg daily.  5. Klor-Con 20 mEq daily.  6. Amlodipine 5 mg daily.  7. TriCor 145 mg daily.  8. Vytorin 10/20, one tablet daily.   REVIEW OF SYSTEMS:  RESPIRATORY:  He says his breathing is much better.  CHEST:  He has not had any chest pain.  No anginal type chest pain.  EXTREMITIES:  He does have some swelling in his legs but he will lie  down and elevate his legs for about an hour during the middle of the day  each day and that decreases the amount of edema in his lower  extremities.  NEUROLOGIC:  He has not had any stroke or TIA symptoms.   PHYSICAL EXAMINATION:  General:   The patient is a 77 year old white male  in no acute distress.  Vital signs:  His blood pressure is 143/82, pulse  is 70 and regular.  Respirations are 20.  His oxygen saturation is 96%  on room air.  Neurologic:  He is alert and oriented x3.  No focal  deficits.  His neck is supple without thyromegaly, adenopathy, or  bruits.  Cardiac:  Has a regular rate and rhythm.  Normal S1 and S2.  I  do not hear a murmur.  Lungs:  Distant breath sounds bilaterally.  There  is no wheezing.  Extremities:  Have trace pitting edema.   LABORATORY DATA:  His chest CT from today shows stable fusiform aneurysm  of the ascending aorta at 4.6 x 4.8-cm in diameter.  There is no change  in coronary artery calcification.  There is no also no change in the low  attenuation nodule on the right lobe of his thyroid and no change in  some scarring in his  right middle lobe.  No findings and stable aneurysm  is the summary.   IMPRESSION:  The patient is a 77 year old gentleman.  He has now been  followed for 5 years for an ascending aortic aneurysm.  This has not  changed during that time.  The biggest change since his visit a year ago  is that he has quit smoking and has noted improvement in his pulmonary  function.  He still has some limitations and some shortness of breath  but it is better than it was a year ago.  I have encouraged him to get  on a regular exercise program even though he really will not be able to  do very much to start with, if he would gradually build up it could make  a big difference for him and help him loose some of the weight he has  gained since he stopped smoking.  His blood pressure is reasonably  controlled.  He has an appointment with Dr. Tenny [name] next week.  We will not  make any changes to his medication regimen at this time.   Brad [name], M.D.  Electronically Signed   SCH/MEDQ  D:  03/02/2007  T:  03/02/2007  Job:  [id number]   cc:   Brad Sabal, MD  Brad Riffle, MD,  [name]  Brad Cage, MD  Brad [name]. Brad [name], M.D.

## 2010-10-05 NOTE — Assessment & Plan Note (Signed)
[facility]                              CARDIOLOGY OFFICE NOTE   Brad [name], GOODELL                      MRN:          [id number]  DATE:02/06/2006                            DOB:          Oct 17, 1927    IDENTIFICATION:  [name] is a 77 year old gentleman with a history of  CAD.  I last saw him in cardiology clinic back in June.   INTERVAL HISTORY:  Since being seen, his biggest complaint is the increasing  size of his ventral hernia.  He is being evaluated by Dr. Carolynne Edouard for possible  surgery with reduction.   This summer, he has been seen by Sandrea Hughs, M.D.  He was discharged for  p.r.n. followup.  He felt he had relatively high level lung function that  should not affect him in the future.   The patient denies chest pain.  His breathing is unchanged.  His activity is  as tolerated.  He has some shortness of breath, but again, no worsening.   CURRENT MEDICATIONS:  1. Plavix 75 mg daily.  2. Isosorbide 30 mg daily.  3. Amlodipine 5 mg daily.  4. Fish oil daily.  5. Vitamin C daily.  6. TriCor 145 mg daily.  7. Crestor 10 mg daily.  8. Toprol-XL 12.5 mg daily.  9. Lasix 20 mg daily.  10.Potassium 20 mEq daily.  11.Advair Diskus discontinued.  12.Spiriva daily.   PHYSICAL EXAMINATION:  GENERAL:  The patient is in no distress.  VITAL SIGNS:  Blood pressure 122/82, pulse 63, weight 220 pounds, up from  217 in June.  LUNGS:  Moving air, somewhat decreased.  CARDIAC:  Regular rate and rhythm.  S1 and S2.  No S3.  No significant  murmurs.  ABDOMEN:  Large ventral hernia present that reduces.  Somewhat larger than  when last seen.  EXTREMITIES:  Trace edema.   ELECTROCARDIOGRAM:  12-lead EKG:  Sinus rhythm, 63 beats per minute, left  axis deviation.   IMPRESSION/RECOMMENDATIONS:  1. Coronary artery disease.  Last cardiac catheterization was back in 2006      (50% left anterior descending lesion; 75-85% diagonal-1 lesion; 70-80%     diagonal-2 lesion; 50% left circumflex; 50-60% right coronary artery).      Myoview scan in May (dobutamine Myoview) showed no signs of scar or      ischemia.  Left ventricular ejection fraction was 60%.  Overall, I      think the patient has been stable clinically with no signs of      increasing ischemia or anginal equivalent.  I think he is overall a low      risk for proceeding with the above surgery.  Does not require any      further testing.  Of note, the patient is being followed by Dr.      Dorris Fetch for a thoracic aneurysm and is soon to have a repeat CT      done.  I recommended contacting his office also before the planned      surgery.  2. Dyslipidemia.  Last lipid panel was over a  year ago.  We will need to      get a fasting lipid at his convenience.  3. Chronic obstructive pulmonary disease on inhalers.  He had been seen in      pulmonary with no marked change.   I will set to see the patient back in about 6 months.  Of course, sooner if  problems develop.  Again, fasting lipids.                                [name], MD, [facility]    PVR/MedQ  DD:  02/07/2006  DT:  02/10/2006  Job #:  [id number]

## 2010-10-05 NOTE — Consult Note (Signed)
NAME:  Brad [name], Brad [name] NO.:  [id number]   MEDICAL RECORD NO.:  [id number]          PATIENT TYPE:  INP   LOCATION:  3316                         FACILITY:  [facility]   PHYSICIAN:  Veverly Fells. Excell Seltzer, MD  DATE OF BIRTH:  Feb 01, 1928   DATE OF CONSULTATION:  05/03/2006  DATE OF DISCHARGE:                                 CONSULTATION   REQUESTING PHYSICIAN:  Chevis Pretty, III.   PRIMARY CARDIOLOGIST:  Dietrich Pates.   REASON FOR CONSULTATION:  Patient is a 77 year old male, postoperative  day number 2 following ventral hernia repair with postoperative  shortness of breath.   HISTORY OF PRESENT ILLNESS:  [name] is a delightful 77 year old  man who just underwent hernia repair of a large ventral hernia.  He has  chronic dyspnea with modest activity.  He is followed for chronic  obstructive pulmonary disease on a long-term basis, and his dyspnea has  been attributed to this in the past.  He notes that since surgery his  shortness of breath is moderately worse than his baseline.  He denies  orthopnea, PND, or chest discomfort.  He complains of abdominal soreness  but has no other acute complaints today.   His past medical history is pertinent for the following:  1. Coronary artery disease.  He is status post previous obtuse      marginal stenting.  His last catheterization in 2005 demonstrated      moderate 2-vessel coronary artery disease.  Medical therapy was      recommended at the time, and the patient has done well with medical      therapy.  He reports no angina.  His last Myoview scan was in May      of this year, and it demonstrated no ischemia.  2. COPD.  3. Essential hypertension.  4. Congenital solitary kidney.  5. Gastroesophageal reflux disease.  6. History of triple A repair.  7. History of appendectomy.  8. Left inguinal hernia repair in 2005.   SOCIAL HISTORY:  The patient lives in [place] with his wife.  He  has a long-standing history of  tobacco but quit smoking in 2003.  He has  not drank alcohol in several years.  He is retired from the [employer].   FAMILY HISTORY:  Mother died at age 66 of a stroke.  His father died in  his 40s of a stroke.   Home medications include:  1. Potassium chloride 20 mEq daily.  2. Crestor 10 mg daily.  3. Plavix 75 mg daily.  4. Toprol XL 25 mg daily.  5. Tricor 145 mg daily.  6. Norvasc 5 mg daily.  7. Imdur 30 mg daily.  8. Lasix 20 mg twice daily.  9. Vitamin C 500 mg daily.  10.Fish oil 2000 mg daily.   ALLERGIES:  ASPIRIN CAUSES SWELLING.  ALSO ALLERGIC TO KEFLEX AND  LIPITOR.   REVIEW OF SYSTEMS:  Complete 12-point review of systems was performed.  Pertinent positives included 20-pound weight gain over the past year.  Patient is edentulous and wears dentures.  He wears  glasses for his  vision.  Chronic dyspnea as described above.  Also has a chronic  nonproductive cough.  He has chronic shoulder, hip, and back pain  secondary to arthritis as well as gastroesophageal reflux disease.  All  of his systems were reviewed and were negative except as described  above.   PHYSICAL EXAMINATION:  The patient is alert and oriented.  He is an  obese male in no acute distress.  Temperature is 97.4.  Heart rate is 83.  Respiratory rate is 18.  Blood  pressure 129/64.  Oxygen saturation is 91% on 3 liters of oxygen per  nasal cannula.  HEENT is normal.  NECK:  Normal carotid upstrokes without bruits.  Jugular venous pressure  is normal.  There is no thyromegaly or thyroid nodules.  Lymphatics:  There is no adenopathy.  LUNGS:  There are decreased breath sounds throughout.  There are no  rales present.  CARDIOVASCULAR:  The apex is not palpable.  The heart is regular rate  and rhythm.  There are no murmurs or gallops.  Abdomen is soft, obese.  There is mild diffuse tenderness.  There is a  dressing in place over the anterior aspect of the abdomen.  EXTREMITIES:  There is no  clubbing, cyanosis, or edema.  Peripheral  pulses are 2+ and equal throughout.  MUSCULOSKELETAL:  There is no joint deformity present.  NEUROLOGIC:  Alert and oriented to all spheres.  Cranial nerves II-XII  are intact.  Strength is 5/5 in the arms and legs.  Chest x-ray performed December 10th showed chronic bronchitic changes  with an ectatic thoracic aorta.  There was no acute disease described.  Followup chest x-ray December 13th showed subsegmental atelectasis in  the left lower lobe.  EKG performed on December 10th shows normal sinus rhythm with left axis  deviation. There are no ST segment or T wave changes.  LABORATORY DATA:  Hemoglobin 13.9; platelets 266,000; creatinine is 1.3;  potassium is 4.0; BNP is 50.  Echocardiogram performed in January of this year demonstrates normal  left ventricular function with a mildly calcified aortic valve, mild  aortic root dilatation, mild to moderate mitral annular calcium, and  right atrial dilatation.   ASSESSMENT:  This is a 77 year old male, postoperative day number 2  after ventral hernia repair.  His current problems are as follows:  1. Dyspnea.  At this point, there are no clinical lab or radiographic      signs of congestive heart failure.  Sometimes, the BNP can be      normal in obese patients, despite CHF.  However, I do not think      there is a substantial component of heart failure in the case of      [name].  I will say that his body habitus makes his exam      somewhat difficult.  I think it is reasonable to try a single      intravenous dose of IV furosemide to see if he has some clinical      response to this.  Will give him 1 dose of 40 mg of IV Lasix and      then recommend continuation of his home daily dose of Lasix.  My      suspicion is that his shortness of breath is secondary to his      underlying lung disease as well as postoperative abdominal      discomfort. 2. Coronary artery disease.  He is  stable.   Recommend continuing his      current therapy.  There are no clinical signs of ischemia.   Thank you for this consultation on this very nice gentleman.  Please  feel free to call at any time with questions.   Sincerely,   Veverly Fells. Tonny Bollman D. Excell Seltzer, MD  Electronically Signed     MDC/MEDQ  D:  05/02/2006  T:  05/03/2006  Job:  [id number]   cc:   [name], MD, [facility]  [facility] [place]

## 2010-10-05 NOTE — Discharge Summary (Signed)
NAME:  Brad [name], Brad [name] NO.:  [id number]   MEDICAL RECORD NO.:  [id number]          PATIENT TYPE:  INP   LOCATION:  5703                         FACILITY:  [facility]   PHYSICIAN:  Ollen Gross. Vernell Morgans, M.D. DATE OF BIRTH:  02-02-1928   DATE OF ADMISSION:  04/30/2006  DATE OF DISCHARGE:  05/05/2006                               DISCHARGE SUMMARY   [name] is a 77 year old gentleman who has had previous abdominal  surgery before.  He had a ventral hernia.  He was brought to the OR on  April 30, 2006 for a laparoscopic ventral hernia repair with mesh.  He tolerated this very well.  He did have a history of coronary artery  disease, so postoperatively he was monitored in the step-down unit.  On  the first and second postoperative day, his volume status was a little  bit tenuous.  Cardiology was consulted to help [place] manage him.  His other  main postoperative problem was just with pain control.  Cardiology  managed his postoperative shortness of breath, and he continued to  improve.  By May 05, 2006, he was tolerating diet, his bowels were  moving, his pain was under better control, and he was ready for  discharge home.   DISCHARGE MEDICATIONS:  He was to resume his home medications.  He was  given a prescription for Vicodin for pain.   ACTIVITY:  No heavy lifting.   DIET:  As tolerated.   FINAL DIAGNOSIS:  Ventral hernia.   FOLLOW UP:  Follow up will be with Dr. Carolynne Edouard in two weeks.   CONDITION ON DISCHARGE:  Stable, and he is discharged home.      Ollen Gross. Vernell Morgans, M.D.  Electronically Signed     PST/MEDQ  D:  07/01/2006  T:  07/01/2006  Job:  [id number]

## 2010-10-05 NOTE — Op Note (Signed)
NAME:  Brad [name], Brad [name]                         ACCOUNT NO.:  [id number]   MEDICAL RECORD NO.:  [id number]                   PATIENT TYPE:  AMB   LOCATION:  DSC                                  FACILITY:  [facility]   PHYSICIAN:  Ollen Gross. Vernell Morgans, M.D.              DATE OF BIRTH:  02-21-1928   DATE OF PROCEDURE:  09/19/2003  DATE OF DISCHARGE:  09/19/2003                                 OPERATIVE REPORT   PREOPERATIVE DIAGNOSES:  Left inguinal hernia.   POSTOPERATIVE DIAGNOSES:  Left direct and indirect inguinal hernia.   OPERATION PERFORMED:  Left inguinal hernia repair with mesh.   SURGEON:  Ollen Gross. Carolynne Edouard, M.D.   ANESTHESIA:  General endotracheal.   DESCRIPTION OF PROCEDURE:  After informed consent was obtained, the patient  was brought to the operating room and placed in supine position on the  operating table.  After adequate induction of general anesthesia, the  patient's left abdomen and groin were prepped with Betadine and draped in  the usual sterile manner.  A small incision was made from the edge of the  pubic tubercle towards the anterior superior iliac spine on the left with a  15 blade knife.  This incision was carried down through the skin and  subcutaneous tissue sharply using the electrocautery.  A small bridging vein  was encountered that was clamped with hemostats, divided and ligated with 3-  0 silk ties.  The incision was then carried down sharply through the rest of  the subcutaneous tissue until the fascia of the external oblique was  encountered.  This fascial layer was opened along its fibers towards the  apex of the external ring and a Weitlaner retractor was used to retract the  fascial layer superiorly and inferiorly.  Care was taken to identify the  ilioinguinal nerve and the cord structures were then surrounded by blunt  finger dissection between two fingers at the edge of the pubic tubercle.  A  half-inch Penrose drain was then placed around the cord  structures for  retraction purposes.  The cord structures were then skeletonized taking care  to avoid injury to the ilioinguinal nerve.  A small hernia sac was able to  be identified.  This was gently separated from the rest of the cord  structures by blunt dissection with a hemostat and sharp dissection with the  electrocautery.  Once it was freed it was opened with the Metzenbaum  scissors.  There were no visceral contents within the hernia sac.  The sac  was then ligated near its base with 2-0 silk suture ligature and the sac was  excised and sent to pathology for identification.  The sac was then allowed  to retract back beneath the transversalis layer.  Also medial to this there  was a broad based defect in the floor of the inguinal canal that was noted  with some bulging.  This was  repaired with interrupted 2-0 Vicryl stitches.  A 3 x 6 piece of Prolene mesh was then cut to fit and tails were cut  laterally in the mesh.  The mesh was sewed inferiorly to the shelving edge  of the inguinal ligament using a running 2-0 Prolene stitch.  The mesh was  then sewed superiorly to the muscular aponeurotic fascial layer of the  transversalis using interrupted 2-0 Prolene vertical mattress stitches.  The  tails of the mesh were wrapped around the cord structures and anchored  laterally to the shelving edge of the inguinal ligament using an interrupted  2-0 Prolene stitch.  Once this was accomplished, the mesh appeared to be in  good position without any tension.  The wound was then irrigated with  copious amounts of saline.  The external oblique was then reapproximated  with a running 2-0 Vicryl stitch.  The wound was then infiltrated with 30 mL  of 0.25% Marcaine.  The subcutaneous fascia was closed with a running 3-0  Vicryl stitch.  The skin was closed with a running 4-0 Monocryl subcuticular stitch.  Benzoin, Steri-Strips and sterile dressings were applied.  The patient  tolerated the  procedure well.  At the end of the case all sponge, needle and  instrument counts were correct.  The patient was awakened and taken to the  recovery room in stable condition.                                               Ollen Gross. Vernell Morgans, M.D.    PST/MEDQ  D:  09/25/2003  T:  09/26/2003  Job:  [id number]

## 2010-10-05 NOTE — Letter (Signed)
March 14, 2006     Brad [name]. Carolynne Edouard, M.D.  [address]  [place], [place] [address]   RE:  [name], [name]  MRN:  [id number]  /  DOB:  [birth year]   Dear. Dr. Carolynne Edouard,   This is a letter regarding Brad [name].  He is a patient I follow in the  Cardiology Clinic.  He is a 77 year old gentleman with CAD.  I last saw him  back in September.   When I saw him, he was doing well, without signs of active angina.  Note, he  had a Myoview scan in May that showed no evidence of scar or ischemia.  He  does not known disease.  LVF was normal.  Overall, I thought he was stable  clinically and was at low risk for proceeding with ventral hernia surgery.   Again, I would recommend continuing medical therapy throughout.  He is on a  low-dose beta-blocker.  Hopefully this can be continued throughout the  perioperative period.  I do not think he is in need of any further testing.    Sincerely,     ______________________________  [name], MD, [facility]    PVR/MedQ  DD: 03/14/2006  DT: 03/17/2006  Job #: [id number]

## 2010-10-05 NOTE — Op Note (Signed)
   NAME:  Brad [name], Brad [name]                         ACCOUNT NO.:  [id number]   MEDICAL RECORD NO.:  [id number]                   PATIENT TYPE:  AMB   LOCATION:  DSC                                  FACILITY:  [facility]   PHYSICIAN:  Ollen Gross. Vernell Morgans, M.D.              DATE OF BIRTH:  05-07-1928   DATE OF PROCEDURE:  03/03/2003  DATE OF DISCHARGE:  03/03/2003                                 OPERATIVE REPORT   PREOPERATIVE DIAGNOSIS:  Lipoma of the back of the neck.   POSTOPERATIVE DIAGNOSIS:  Lipoma of the back of the neck.   OPERATION/PROCEDURE:  Excision 1 cm lipoma from the back of the neck.   SURGEON:  Ollen Gross. Carolynne Edouard, M.D.   ANESTHESIA:  Local.   DESCRIPTION OF PROCEDURE:  After informed consent was obtained, the patient  was brought to the operating room and placed in the prone position on the  operating table.  The back of the patient's neck was prepped with Betadine  and draped in the usual sterile manner.  The area was then infiltrated with  1% lidocaine with epinephrine until a good field block was obtained.  A  small transverse incision was made overlying the mass with a 15-blade knife.  This incision was carried down through the subcutaneous tissue sharply with  the 15-blade knife and Metzenbaum scissors until the mass was identified.  The mass was able to be separated by a combination of blunt dissection with  the hemostat and sharp dissection with the Metzenbaum scissors from the rest  of the surrounding subcutaneous tissue.  Once the mass was removed, it was  sent to pathology for further evaluation.  The wound was examined and felt  to be hemostatic.  The incision was then closed with interrupted 4-0  Monocryl subcuticular stitches.  Dermabond dressing was then applied.  The  patient tolerated the procedure well.  At the end of the case all needle,  sponge and instrument counts were correct.  The patient was then taken to  the recovery room in stable condition.                                               Ollen Gross. Vernell Morgans, M.D.    PST/MEDQ  D:  03/06/2003  T:  03/06/2003  Job:  [id number]

## 2010-10-05 NOTE — Cardiovascular Report (Signed)
NAME:  Brad [name], Brad [name]                         ACCOUNT NO.:  [id number]   MEDICAL RECORD NO.:  [id number]                   PATIENT TYPE:  OIB   LOCATION:  2861                                 FACILITY:  [facility]   PHYSICIAN:  Carole Binning, M.D. [facility]         DATE OF BIRTH:  05/13/1928   DATE OF PROCEDURE:  08/10/2003  DATE OF DISCHARGE:  08/10/2003                              CARDIAC CATHETERIZATION   PROCEDURES PERFORMED:  Left heart catheterization with coronary angiography,  left ventriculography and abdominal aortography.   INDICATIONS:  Brad [name] is a 77 year old male with a history of coronary  artery disease and previous stent placement in the second obtuse marginal  branch.  He also has a previous abdominal aortic aneurysm repair.  He  presented to the office with symptoms of progressive exertional chest pain.  He was thus referred for cardiac catheterization.   PROCEDURAL NOTE:  A 6-French sheath was placed in the right femoral artery.  The central aorta was accessed with a Wholey wire.  All catheter exchanges  were performed over an exchange length of J wire.  Coronary angiography was  performed with 6-French JR4 and JL5 catheters.  Left ventriculography and  abdominal aortography were performed with an angled pigtail catheter.  Contrast was Omnipaque.  At the conclusion of the procedure, an Angio-Seal  vascular closure device was placed in the right femoral artery with good  hemostasis.  There were no complications.   CATHETERIZATION RESULTS:   HEMODYNAMICS:  1. Left ventricular pressure:  160/22.  2. Aortic pressure:  160/84.  3. There is no aortic valve gradient.   LEFT VENTRICULOGRAM:  Wall motion is normal.  Ejection fraction is estimated  at greater than or equal to 60%.  There is no mitral regurgitation.  There  is generalized dilatation of the ascending aorta.   Abdominal aortogram reveals a patent and intact aortoiliac graft.  The right  renal artery  is very large filling a large right kidney.  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  The left kidney is not seen.   CORONARY ARTERIOGRAPHY (RIGHT-DOMINANT):  1. The coronaries are heavily calcified, particularly the right coronary     artery and the left anterior descending artery.  2. The left main is normal.  3. The left anterior descending artery has a tubular 60% stenosis in the     proximal mid vessel extending across the origin of the first and second     diagonal branches.  This is a moderately calcified segmented vessel.  The     first diagonal branch itself is normal in size and has a 75 to 80%     stenosis at its origin.  The second diagonal branch is small and has a     70% stenosis at its origin.  There is also a small third diagonal rising     from the distal LAD.  4. The left circumflex gives  rise to two normal sized obtuse marginal     branches.  The first obtuse marginal has a 50% stenosis in the proximal     body.  The second obtuse marginal branch has a stent which is widely     patent.  However, just proximal to the stent in the proximal portion of     the second obtuse marginal branch, there is a 50% stenosis.  5. The right coronary is the dominant vessel.  It is heavily calcified in     the proximal to mid vessel.  There is a tubular 50 to 60% stenosis in the     proximal vessel.  The mid vessel has a 30% stenosis and the distal vessel     has a 30% stenosis.  The distal right coronary artery gives rise to a     normal sized posterior descending artery, which bifurcates.  In the     smaller limb of the posterior descending artery at its bifurcation point,     there is a 70% stenosis.  The distal right coronary artery also gives     rise to small first and second posterolateral branches and a normal sized     third posterolateral branch.   IMPRESSION:  1. Normal left ventricular systolic function.  2. Possible thoracic aortic aneurysm.  3. Patent aortoiliac  graft.  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  4. Moderate two-vessel coronary artery disease as described.  There is a     patent stent in the second obtuse marginal branch.  There is moderate     disease in the left anterior descending artery and in the right coronary     artery, which does not appear to be flow limiting.  There is significant     disease in the ostium of the first diagonal branch.  In comparison to the     films from the previous catheterization in November 2003, these areas do     not appear to be significantly changed.   RECOMMENDATION:  The patient will be managed medically at this point.  We  will obtain an outpatient adenosine Cardiolite to rule out ischemia.  The  patient reports having undergone a recent CT scan to evaluate his thoracic  aorta and he has been seen by Dr. Dorris Fetch and vascular surgery regarding  this and the plan is for medical therapy.                                               Carole Binning, M.D. [facility]    MWP/MEDQ  D:  08/10/2003  T:  08/11/2003  Job:  [id number]   cc:   [name], M.D.   Cardiac Catheterization Laboratory   Juliette Alcide, M.D.  [facility]  [phone] [address]  [address] [place], [place] [address]

## 2010-10-05 NOTE — Assessment & Plan Note (Signed)
[facility]                               PULMONARY OFFICE NOTE   Brad, FITZSIMMONS                      MRN:          [id number]  DATE:12/13/2005                            DOB:          1927-12-18    REASON FOR VISIT:  Pulmonary/final followup office visit.   HISTORY:  This is a 77 year old white male status post smoking cessation in  2003 with evidence of mild COPD by PFTs dated 10/28/2005, but dyspnea just  going from the mailbox and back to the house for several years.  I thought  there probably were multiple factors that contributed to his activity  intolerance, including obesity, deconditioning, degenerative arthritis and  COPD, but was not convinced that Advair was really helping him.  I asked him  to switch to Spiriva, thinking that he might be limited by dynamic  hyperinflation and benefit more from Spiriva than Advair.  He said it did  not seem to make any difference, but in any case, does not really think  that dyspnea is what is stopping him from going to the mailbox and back.  On  further reflection, he thinks it is pain in his back and hips.  He does have  Combivent for p.r.n. use, but has not attempted to use it.   For full inventory of medications, please see face sheet dated 12/13/2005.   PHYSICAL EXAMINATION:  GENERAL:  He is an obese, ambulatory, white male in  no acute distress.  VITAL SIGNS:  Stable. Weight 222 pounds which is no change from baseline.  HEENT:  Unremarkable.  Pharynx clear.  LUNGS:  Lung fields revealed diminished breath sounds, but no wheezing.  CARDIOVASCULAR:  Regular rhythm without murmur, gallop or rub.  ABDOMEN:  Soft, benign.  EXTREMITIES:  No calf tenderness, cyanosis, clubbing or edema.   O2 saturation 96% on room air.   IMPRESSION:  Chronic obstructive pulmonary disease, relatively mild and  probably not truly activity limiting at this point.  I doubt that dynamic  airflow obstruction has  much to do with the actual mechanism of exercise on  the patient.  Recommended he try stopping Spiriva to see if it makes any  difference in terms of his activity tolerance.  If he notices worsening in  dyspnea, he can either use Combivent p.r.n. immediately before exercise or  switch back to using Spiriva perfectly regularly.   However, since Spiriva made no difference relative to Advair, Spiriva would  be the relatively cheaper cost alternative with probably less effects on the  upper airway and it would be my choice for chronic dyspnea that truly limits  his activity.   I explained this to the patient in as much detail as I could, emphasizing  that there is no evidence that Spiriva changes the natural history of COPD,  and that stopping smoking, not choice of inhalers or physician, was the most  important thing he could do to arrest the progression of this  disease, and he did so at a relatively high level of lung function which  should not affect him in the  future.  Pulmonary followup therefore can be  p.r.n.Charlaine Dalton. Sherene Sires, MD, [facility]   MBW/MedQ  DD:  12/13/2005  DT:  12/13/2005  Job #:  [id number]

## 2010-10-05 NOTE — Assessment & Plan Note (Signed)
[facility]                            CARDIOLOGY OFFICE NOTE   TREA, LATNER                      MRN:          [id number]  DATE:07/21/2006                            DOB:          Jan 21, 1928    PATIENT IDENTIFICATION:  Brad [name] is a 77 year old gentleman with  history of CAD, last catheterization in 2006.  Myoview in May showed no  evidence of ischemia or scar (cath in 2006, 50% LAD, 75-85 D1, 70-80 D2,  50% circumflex, 50-60% RCA).  I last saw the patient back in September.   In the interval, he has had abdominal surgery to correct a ventral  hernia.  He says he is feeling much better, breathing is better.   CURRENT MEDICATIONS:  1. Metoprolol 12.5 daily.  2. Plavix 75 daily.  3. Isorbid 30 daily.  4. Amlodipine 5 daily.  5. Fish oil 1 g daily.  6. Vitamin C.  7. Tri-Chlor 145.  8. Crestor increased to 20.  9. Lasix 20.  10.Potassium 120.   PHYSICAL EXAMINATION:  GENERAL:  The patient is in no distress.  VITAL SIGNS:  Blood pressure 147/86, pulse 69, weight 214 down from 220.  NECK:  JVP is normal, no bruits.  LUNGS:  Clear.  CARDIAC:  Regular rate and rhythm, S1, S2.  No S3.  No murmurs.  ABDOMEN:  Obese, ventral hernia repaired.  EXTREMITIES:  No edema.   IMPRESSION:  1. Coronary artery disease.  Clinically doing well, continue.  2. Dyslipidemia.  Recently increased Crestor to 20 daily.  We have      given him some samples for the recently increased Crestor to      question 20 versus 40.  We will need to follow up on this.  He is      followed by Lindaann Pascal.  3. Chronic obstructive pulmonary disease, doing better.   I would like to see the patient back in the fall, encouraged him to stay  as active as he can.     [name], MD, [facility]  Electronically Signed    PVR/MedQ  DD: 07/21/2006  DT: 07/22/2006  Job #: [id number]   cc:   Lindaann Pascal, PA

## 2010-10-05 NOTE — Op Note (Signed)
NAME:  Brad, [name] NO.:  [id number]   MEDICAL RECORD NO.:  [id number]          PATIENT TYPE:  INP   LOCATION:  3316                         FACILITY:  [facility]   PHYSICIAN:  Ollen Gross. Vernell Morgans, M.D. DATE OF BIRTH:  November 03, 1927   DATE OF PROCEDURE:  04/30/2006  DATE OF DISCHARGE:                               OPERATIVE REPORT   PREOPERATIVE DIAGNOSIS:  Ventral hernia.   POSTOPERATIVE DIAGNOSIS:  Ventral hernia.   PROCEDURES:  1. Laparoscopic ventral hernia repair with mesh.  2. Lysis of adhesions.   SURGEON:  Ollen Gross. Vernell Morgans, M.D.   ASSISTANT:  Leonie Man, M.D.   ANESTHESIA:  General endotracheal.   PROCEDURE:  After informed consent was obtained, the patient was brought  to the operating room, and placed in the supine position on the  operating table.  After adequate induction of general anesthesia, the  patient's abdomen was prepped with Betadine and draped in the usual  sterile manner including the use of Ioban.  The right upper quadrant,  laterally, was then infiltrated with 1/4% Marcaine.  A small incision  was made with the 15-blade knife.  This incision was carried down  through the subcutaneous tissue, bluntly, with the Army-Nav[employer] retractors  and a hemostat until the fascia of the abdominal wall was encountered.   The fascial layers were opened sharply with a 15-blade knife.  The  muscle was separated bluntly with a Kelly clamp; and this was done until  the peritoneum was identified.  The peritoneum was grasped with  hemostats, and opened sharply with this Metzenbaum scissors which  allowed access to the abdominal cavity.  Then #0 Vicryl pursestring  stitch is placed on the fascia around the opening.  A Hasson cannula was  placed through the opening; and anchored in place with a previously  placed #0 Vicryl pursestring stitch.   The abdomen was then insufflated carbon dioxide without difficulty.  A  laparoscope was inserted through the Hasson  cannula and the abdomen was  inspected.  The patient had some Swiss-cheese-like hernias of the  abdominal wall from his previous operation.  He had a lot of omental  fatty adhesions to the anterior abdominal wall.  One of the hernias did  contain a loop of intestine which was reduced easily with gentle  traction.  The omental adhesions to the anterior abdominal wall were all  taken down sharply with laparoscopic scissors and a Harmonic scalpel.   Once this was accomplished, the anterior wall was free of any debris.  The falciform ligament was also taken down sharply with the harmonic  scalpel.  The hernia size was then estimated; and a piece of 20 x 25  proceed mesh was chosen to cover this area.  Eight #1 Novofil stitches  were placed around the periphery of this mesh equidistant from each  other; the mesh was oriented with the blue side to the sky.  The mesh  was then rolled like a cigar and placed through the Hasson cannula and  into the abdominal cavity.  Another 5-mm port was placed on the right  lateral abdomen below the [facility]; and a 5-mm port was also placed on the  left lateral abdomen.  This was done by infiltrating these areas with  1/4% Marcaine, making small stab incisions with a 15-blade knife and  placing 5 mm ports through these incisions, into the abdominal cavity  under direct vision.  The mesh was then unrolled and reoriented and  oriented, according to letters that were placed on the abdominal wall.  Eight stab incisions were made around the periphery of the abdomen,  corresponding to the placement of the Novofil stitches.   A suture passer was then placed through each of these eight incisions to  grasp the appropriate tails of the Novofil stitch, and to bring these  through the abdominal wall.  Once this had been done circumferentially,  all of these stitches were tied down and the mesh was nicely seen to  approximate to the abdominal wall.  The gaps in between the  stitches  were closed with tacks in two layers.  Once this was accomplished, the  mesh was inspected and found to be hemostatic; and in very good  apposition to the abdominal wall.  The abdomen was inspected; and no  other abnormalities were noted.  The 5-mm camera was then placed on the  left side of the abdomen; and the Hassan cannula was removed.  The  fascial defect was closed with previously placed Vicryl pursestring  stitch; as well as with a couple other interrupted #0 Vicryl stitches  and there appeared to be good repair on the Hasson port site.  The other  5-mm ports were removed, under direct vision, and found to be  hemostatic.  The gas was allowed to escape.   All the skin incisions were closed with interrupted 4-0 Monocryl  subcuticular stitches.  Benzoin, Steri-Strips, and sterile dressings  were applied.  The patient tolerated the procedure well.  At the end of  the case all needle, sponge, and instrument counts were correct.  The  patient was then awakened and taken to recovery in stable condition.      Ollen Gross. Vernell Morgans, M.D.  Electronically Signed     PST/MEDQ  D:  05/02/2006  T:  05/03/2006  Job:  [id number]

## 2010-10-05 NOTE — Assessment & Plan Note (Signed)
[facility]                            CARDIOLOGY OFFICE NOTE   QUEST, TAVENNER                      MRN:          [id number]  DATE:03/09/2007                            DOB:          Apr 05, 1928    DICTATION CANCELLED     [name], MD, [facility]  Electronically Signed    PVR/MedQ  DD: 03/17/2007  DT: 03/17/2007  Job #: [id number]

## 2010-10-19 ENCOUNTER — Telehealth: Payer: Self-pay | Admitting: Internal Medicine

## 2010-10-19 MED ORDER — AMLODIPINE BESYLATE 5 MG PO TABS: 5.0000 mg | ORAL_TABLET | Freq: Every day | ORAL | Status: DC

## 2010-10-19 MED ORDER — CLOPIDOGREL BISULFATE 75 MG PO TABS: 75.0000 mg | ORAL_TABLET | Freq: Every day | ORAL | Status: DC

## 2010-10-19 NOTE — Telephone Encounter (Signed)
Pharmacist states Pt needs plavix 75 mg # 30 and norvasc 5 mg # 30 to be call [facility] to [facility] #[phone]

## 2010-10-19 NOTE — Telephone Encounter (Signed)
rx called into pharmacy

## 2010-12-21 ENCOUNTER — Other Ambulatory Visit: Payer: Self-pay | Admitting: Orthopedic Surgery

## 2010-12-21 DIAGNOSIS — M25551 Pain in right hip: Secondary | ICD-10-CM

## 2010-12-25 ENCOUNTER — Ambulatory Visit
Admission: RE | Admit: 2010-12-25 | Discharge: 2010-12-25 | Disposition: A | Discharge status: Home / Self Care | Payer: Medicare Other | Source: Ambulatory Visit | Attending: Orthopedic Surgery | Admitting: Orthopedic Surgery

## 2010-12-25 DIAGNOSIS — M25551 Pain in right hip: Secondary | ICD-10-CM

## 2010-12-25 MED ORDER — METHYLPREDNISOLONE ACETATE 40 MG/ML INJ SUSP (RADIOLOG
120.0000 mg | Freq: Once | INTRAMUSCULAR | Status: AC
  Administered 2010-12-25: 120 mg via INTRA_ARTICULAR

## 2010-12-25 MED ORDER — IOHEXOL 180 MG/ML  SOLN
1.0000 mL | Freq: Once | INTRAMUSCULAR | Status: AC | PRN
  Administered 2010-12-25: 1 mL via INTRA_ARTICULAR

## 2010-12-27 ENCOUNTER — Other Ambulatory Visit: Payer: Self-pay | Admitting: *Deleted

## 2010-12-27 MED ORDER — POTASSIUM CHLORIDE CRYS ER 20 MEQ PO TBCR: 20.0000 meq | EXTENDED_RELEASE_TABLET | Freq: Two times a day (BID) | ORAL | Status: DC

## 2011-01-03 ENCOUNTER — Other Ambulatory Visit: Payer: Self-pay | Admitting: Internal Medicine

## 2011-02-01 ENCOUNTER — Telehealth: Payer: Self-pay | Admitting: Internal Medicine

## 2011-02-01 NOTE — Telephone Encounter (Signed)
Patient need to come off plavix 5 day prior due for spinal injection.

## 2011-02-01 NOTE — Telephone Encounter (Signed)
Called Sharon at [facility]. Patient needs an epidural/nerve block and physician is requesting to hold Plavix for 5 days. Advised will ask Dr.Ross on 9/17 and call back.

## 2011-02-02 ENCOUNTER — Telehealth: Payer: Self-pay | Admitting: Physician Assistant

## 2011-02-02 NOTE — Telephone Encounter (Addendum)
Pt son called again about holding Plavix. Told him I would ask attending and call him back. Spoke w/ PR and called pt's son back. OK to hold Plavix for 5 days, rec. scheduling procedure, hold Rx 5 days before and restart when OK w/ MD. Pt son will tell pt and make sure he understands.

## 2011-02-02 NOTE — Telephone Encounter (Signed)
OK to hold plavix.  Resume after. Rhonda to call on Saturday.

## 2011-02-04 ENCOUNTER — Encounter: Payer: Self-pay | Admitting: *Deleted

## 2011-02-06 NOTE — Telephone Encounter (Signed)
Brad [name] aware that patient can hold Plavix per Dr.Ross. Note faxed to [facility].

## 2011-02-06 NOTE — Telephone Encounter (Signed)
Left message for Jasmine December from [facility] to call me back.

## 2011-03-07 ENCOUNTER — Ambulatory Visit: Payer: Medicare Other | Attending: Orthopedic Surgery | Admitting: Physical Therapy

## 2011-03-07 DIAGNOSIS — IMO0001 Reserved for inherently not codable concepts without codable children: Secondary | ICD-10-CM | POA: Insufficient documentation

## 2011-03-07 DIAGNOSIS — R5381 Other malaise: Secondary | ICD-10-CM | POA: Insufficient documentation

## 2011-03-07 DIAGNOSIS — M545 Low back pain, unspecified: Secondary | ICD-10-CM | POA: Insufficient documentation

## 2011-03-08 ENCOUNTER — Other Ambulatory Visit: Payer: Self-pay | Admitting: Thoracic Surgery (Cardiothoracic Vascular Surgery)

## 2011-03-08 DIAGNOSIS — I712 Thoracic aortic aneurysm, without rupture: Secondary | ICD-10-CM

## 2011-03-11 ENCOUNTER — Ambulatory Visit: Payer: Medicare Other | Admitting: Physical Therapy

## 2011-03-13 ENCOUNTER — Ambulatory Visit: Payer: Medicare Other | Admitting: Physical Therapy

## 2011-03-18 ENCOUNTER — Ambulatory Visit: Payer: Medicare Other | Admitting: Physical Therapy

## 2011-03-20 ENCOUNTER — Ambulatory Visit: Payer: Medicare Other | Attending: Orthopedic Surgery | Admitting: Physical Therapy

## 2011-03-20 DIAGNOSIS — M545 Low back pain, unspecified: Secondary | ICD-10-CM | POA: Insufficient documentation

## 2011-03-20 DIAGNOSIS — IMO0001 Reserved for inherently not codable concepts without codable children: Secondary | ICD-10-CM | POA: Insufficient documentation

## 2011-03-20 DIAGNOSIS — R5381 Other malaise: Secondary | ICD-10-CM | POA: Insufficient documentation

## 2011-03-25 ENCOUNTER — Ambulatory Visit: Payer: Medicare Other | Attending: Orthopedic Surgery | Admitting: Physical Therapy

## 2011-03-25 DIAGNOSIS — M545 Low back pain, unspecified: Secondary | ICD-10-CM | POA: Insufficient documentation

## 2011-03-25 DIAGNOSIS — R5381 Other malaise: Secondary | ICD-10-CM | POA: Insufficient documentation

## 2011-03-25 DIAGNOSIS — IMO0001 Reserved for inherently not codable concepts without codable children: Secondary | ICD-10-CM | POA: Insufficient documentation

## 2011-03-27 ENCOUNTER — Ambulatory Visit: Payer: Medicare Other | Admitting: Physical Therapy

## 2011-04-01 ENCOUNTER — Ambulatory Visit: Payer: Medicare Other | Admitting: Physical Therapy

## 2011-04-01 ENCOUNTER — Encounter: Payer: Self-pay | Admitting: Thoracic Surgery (Cardiothoracic Vascular Surgery)

## 2011-04-01 ENCOUNTER — Encounter: Payer: Self-pay | Admitting: *Deleted

## 2011-04-01 DIAGNOSIS — J449 Chronic obstructive pulmonary disease, unspecified: Secondary | ICD-10-CM | POA: Insufficient documentation

## 2011-04-01 DIAGNOSIS — F1020 Alcohol dependence, uncomplicated: Secondary | ICD-10-CM | POA: Insufficient documentation

## 2011-04-03 ENCOUNTER — Ambulatory Visit: Payer: Medicare Other | Admitting: Physical Therapy

## 2011-04-04 ENCOUNTER — Ambulatory Visit
Admission: RE | Admit: 2011-04-04 | Discharge: 2011-04-04 | Disposition: A | Discharge status: Home / Self Care | Payer: Medicare Other | Source: Ambulatory Visit | Attending: Thoracic Surgery (Cardiothoracic Vascular Surgery) | Admitting: Thoracic Surgery (Cardiothoracic Vascular Surgery)

## 2011-04-04 ENCOUNTER — Encounter: Payer: Self-pay | Admitting: Thoracic Surgery (Cardiothoracic Vascular Surgery)

## 2011-04-04 ENCOUNTER — Ambulatory Visit (INDEPENDENT_AMBULATORY_CARE_PROVIDER_SITE_OTHER): Payer: Medicare Other | Admitting: Thoracic Surgery (Cardiothoracic Vascular Surgery)

## 2011-04-04 VITALS — BP 153/86 | HR 76 | Resp 20 | Ht 68.0 in | Wt 195.0 lb

## 2011-04-04 DIAGNOSIS — I712 Thoracic aortic aneurysm, without rupture: Secondary | ICD-10-CM

## 2011-04-04 MED ORDER — IOHEXOL 300 MG/ML  SOLN: 100.0000 mL | Freq: Once | INTRAMUSCULAR | Status: AC | PRN

## 2011-04-04 NOTE — Progress Notes (Signed)
[address]            [place] [address]          [phone]       SHAHAB [name] [facility] Medical Record #[id number] Date of Birth: 11/09/1927  Referring: [name], MD Primary Care: Monica Becton, MD, MD  Chief Complaint:    Chief Complaint  Patient presents with  . Follow-up    1 year f/u with Chest CT, surveillance on ascending aortic aneurysm    History of Present Illness:     [name] is an 77 year old gentleman who was discovered in 2003 to have a 4.5 cm descending aortic aneurysm. I been following him on an annual basis with scan since that time, his last visit was in November of 2011 which time the aortic aneurysm was unchanged. He now returns for a scheduled one year followup visit.  Says since his last visit he has not had any chest pain, he does get some shortness of breath with exertion which is stable. His biggest issue has been back pain, he had some back trouble and required cortisone injections back in July, and so that now is better and not bothering him nearly as much. He says that he is not is active as he had been, primarily due to having to care for his wife who has Alzheimer's   Current Activity/ Functional Status: Patient is independent with mobility/ambulation, transfers, ADL's, IADL's.   Past Medical History  Diagnosis Date  . HTN (hypertension)   . Tobacco abuse   . Ethanolism   . COPD (chronic obstructive pulmonary disease)   . Unilateral congenital absence of kidney     Past Surgical History  Procedure Date  . Appendectomy   . Hernia repair   . Resection and grafting of infarenal  abdominal  aortic aneurysm 05/03/2002    History  Smoking status  . Former Smoker  Smokeless tobacco  . Not on file   History  Alcohol Use  . Yes    History   Social History  . Marital Status: Married    Spouse Name: N/A    Number of Children: N/A  . Years of Education: N/A   Occupational  History  . Not on file.   Social History Main Topics  . Smoking status: Former Games developer  . Smokeless tobacco: Not on file  . Alcohol Use: Yes  . Drug Use: No  . Sexually Active: Not on file   Other Topics Concern  . Not on file   Social History Narrative  . No narrative on file    Allergies  Allergen Reactions  . Aspirin   . Atorvastatin   . Cephalexin     Current Outpatient Prescriptions  Medication Sig Dispense Refill  . amLODipine (NORVASC) 5 MG tablet Take 1 tablet (5 mg total) by mouth daily.  30 tablet  6  . cholecalciferol (VITAMIN D) 1000 UNITS tablet Take 1,000 Units by mouth daily.        . clopidogrel (PLAVIX) 75 MG tablet Take 1 tablet (75 mg total) by mouth daily.  30 tablet  6  . fish oil-omega-3 fatty acids 1000 MG capsule Take 1 g by mouth daily.        . furosemide (LASIX) 20 MG tablet Take 20 mg by mouth 2 (two) times daily.        Marland Kitchen  isosorbide mononitrate (IMDUR) 30 MG 24 hr tablet Take 30 mg by mouth daily.        Marland Kitchen KLOR-CON M20 20 MEQ tablet TAKE 1 TABLET EVERY DAY  30 tablet  2  . metoprolol succinate (TOPROL-XL) 25 MG 24 hr tablet Take 25 mg by mouth daily.        . rosuvastatin (CRESTOR) 10 MG tablet Take 10 mg by mouth daily.         No current facility-administered medications for this visit.   Facility-Administered Medications Ordered in Other Visits  Medication Dose Route Frequency Provider Last Rate Last Dose  . iohexol (OMNIPAQUE) 300 MG/ML injection 100 mL  100 mL Intravenous Once PRN Medication Radiologist         (Not in a hospital admission)  No family history on file.   Review of Systems:     Cardiac Review of Systems: Y or N  Chest Pain [    ]  Resting SOB [   ] Exertional SOB  [  ]  Orthopnea [  ]   Pedal Edema [   ]    Palpitations [  ] Syncope  [  ]   Presyncope [   ]  General Review of Systems: [Y] = yes [  ]=no Constitional: recent weight change [  ]; anorexia [  ]; fatigue [  ]; nausea [  ]; night sweats [  ]; fever [  ];  or chills [  ];                                                                                                                                          Dental: poor dentition[  ]; Last Dentist visit: unknown     Eye : blurred vision [  ]; diplopia [   ]; vision changes [  ];  Amaurosis fugax[  ]; Resp: cough [  ];  wheezing[  ];  hemoptysis[  ]; shortness of breath[  ]; paroxysmal nocturnal dyspnea[  ]; dyspnea on exertion[  ]; or orthopnea[  ];  GI:  gallstones[  ], vomiting[  ];  dysphagia[  ]; melena[  ];  hematochezia [  ]; heartburn[  ];   Hx of  Colonoscopy[  ]; GU: kidney stones [  ]; hematuria[  ];   dysuria [  ];  nocturia[  ];  history of     obstruction [  ];             Skin: rash, swelling[  ];, hair loss[  ];  peripheral edema[  ];  or itching[  ]; Musculosketetal: myalgias[  ];  joint swelling[  ];  joint erythema[  ];  joint pain[  ];  back pain[  ];  Heme/Lymph: bruising[  ];  bleeding[  ];  anemia[  ];  Neuro: TIA[  ];  headaches[  ];  stroke[  ];  vertigo[  ];  seizures[  ];   paresthesias[  ];  difficulty walking[  ];  Psych:depression[  ]; anxiety[  ];  Endocrine: diabetes[  ];  thyroid dysfunction[  ];  Immunizations: Flu [  ]; Pneumococcal[  ];  Other:  Physical Exam: BP 153/86  Pulse 76  Resp 20  Ht 5\' 8"  (1.727 m)  Wt 88.451 kg (195 lb)  BMI 29.65 kg/m2  SpO2 95%  General appearance: alert and no distress Neurologic: intact Heart: regular rate and rhythm and systolic murmur: systolic ejection 2/6, blowing at 2nd right intercostal space Lungs: diminished breath sounds bilaterally   Diagnostic Studies & Laboratory data:     Recent Radiology Findings:   Ct Angio Chest W/cm &/or Wo Cm  04/04/2011  And *RADIOLOGY REPORT*  Clinical Data:  Follow up of ascending aortic aneurysm, repair in 2003  CT ANGIOGRAPHY CHEST WITH CONTRAST  Technique:  Multidetector CT imaging of the chest was performed using the standard protocol during bolus administration of intravenous  contrast.  Multiplanar CT image reconstructions including MIPs were obtained to evaluate the vascular anatomy.  Contrast:  100 ml Omnipaque-300  Comparison:  CT angio chest of 03/26/2010  Findings:  There is little change in the appearance of the repair of the ascending aortic aneurysm.  Just above the aortic valve the ascending aorta measures 36 mm compared to 39 mm on prior study. In the mid ascending aorta the measurement is 44 mm compared to 42 mm previously.  The thoracic aortic arch measures 33 mm and compared to 35 mm previously.  The mid descending thoracic aorta measures 29 mm compared to 28 mm previously.  The distal thoracic aorta just above the diaphragmatic hiatus measures 25 mm compared to 27 mm previously.  Moderate atheromatous change is noted throughout the aortic arch and the descending thoracic aorta.  The origins of the great vessels are patent.  No mediastinal or hilar adenopathy is seen with stable mediastinal nodes.  The pulmonary arteries opacify with no acute abnormality noted.  On the lung window images, changes of centrilobular emphysema are noted diffusely.  A nodular opacity in the right middle lobe superiorly is stable and is associated with the fissure, possibly a fissural lymph node.  No suspicious lung nodule is seen.  No effusion is noted. There are degenerative changes throughout the thoracic spine  Review of the MIP images confirms the above findings.  IMPRESSION: . 1.  Stable configuration of the ascending aorta with maximal diameter of 44 mm in the mid ascending aorta. 2.  Moderate atheromatous change throughout the thoracic aortic arch and descending thoracic aorta. 3.  Diffuse changes of centrilobular emphysema.  Original Report Authenticated By: Juline Patch, M.D.      Recent Lab Findings: No results found for this basename: WBC, HGB, HCT, PLT, GLUCOSE, CHOL, TRIG, HDL, LDLDIRECT, LDLCALC, ALT, AST, NA, K, CL, CREATININE, BUN, CO2, TSH, INR, GLUF, HGBA1C       Assessment / Plan:      Mr. Crookshanks has a 4.4 cm descending aortic aneurysm , which unchanged from year ago, and has had little or no change since first discovered in 2003.  Recommend followup with CT angiogram in one year.  We previously discussed doing MR angios, but the patient prefers CT.      @me1 @ 04/04/2011 10:30 AM

## 2011-04-08 ENCOUNTER — Ambulatory Visit: Payer: Medicare Other | Admitting: Physical Therapy

## 2011-04-09 ENCOUNTER — Ambulatory Visit: Payer: Medicare Other | Admitting: Physical Therapy

## 2011-04-10 ENCOUNTER — Encounter: Payer: Medicare Other | Admitting: Physical Therapy

## 2011-04-15 ENCOUNTER — Ambulatory Visit: Payer: Medicare Other | Admitting: Physical Therapy

## 2011-04-17 ENCOUNTER — Ambulatory Visit: Payer: Medicare Other | Admitting: Physical Therapy

## 2011-04-26 ENCOUNTER — Telehealth: Payer: Self-pay | Admitting: Internal Medicine

## 2011-04-26 NOTE — Telephone Encounter (Signed)
New problem Pt has been having numbness in his left arm and tingling in his right hand no sob or chest pain. This has been going on for a couple of weeks. Please call son back

## 2011-04-26 NOTE — Telephone Encounter (Signed)
LMOM for call back. 

## 2011-04-29 NOTE — Telephone Encounter (Signed)
Spoke with patient's son. He is complaining of bilateral numbness in arms and hands on and off for 2 weeks. No complaints of chest pain or SOB. Advised him to seek another appointment with Dr.Brooks because of his spinal disease. He has not seen Dr.Ross in over 1 year. No openings until January. The son would like him seen this month. Appointment made for him to see Tereso Newcomer PA on 12/18 at 2 pm.

## 2011-05-07 ENCOUNTER — Encounter: Payer: Self-pay | Admitting: Physician Assistant

## 2011-05-07 ENCOUNTER — Other Ambulatory Visit: Payer: Self-pay | Admitting: Internal Medicine

## 2011-05-07 ENCOUNTER — Ambulatory Visit (INDEPENDENT_AMBULATORY_CARE_PROVIDER_SITE_OTHER): Payer: Medicare Other | Admitting: Physician Assistant

## 2011-05-07 DIAGNOSIS — G5600 Carpal tunnel syndrome, unspecified upper limb: Secondary | ICD-10-CM

## 2011-05-07 DIAGNOSIS — R42 Dizziness and giddiness: Secondary | ICD-10-CM

## 2011-05-07 DIAGNOSIS — I2510 Atherosclerotic heart disease of native coronary artery without angina pectoris: Secondary | ICD-10-CM

## 2011-05-07 DIAGNOSIS — G5603 Carpal tunnel syndrome, bilateral upper limbs: Secondary | ICD-10-CM | POA: Insufficient documentation

## 2011-05-07 DIAGNOSIS — I498 Other specified cardiac arrhythmias: Secondary | ICD-10-CM

## 2011-05-07 MED ORDER — METOPROLOL SUCCINATE ER 25 MG PO TB24: 12.5000 mg | ORAL_TABLET | Freq: Every day | ORAL | Status: DC

## 2011-05-07 NOTE — Patient Instructions (Signed)
Your physician recommends that you schedule a follow-up appointment in: 3-4 weeks with Dr [name] or Tereso Newcomer, PA-C   Your physician has recommended that you wear a holter monitor. Holter monitors are medical devices that record the heart's electrical activity.  Doctors most often use these monitors to diagnose arrhythmias. Arrhythmias are problems with the speed or rhythm of the heartbeat. The monitor is a small, portable device. You can wear one while you do your normal daily activities. This is usually used to diagnose what is causing palpitations/syncope (passing out).  Your physician has requested that you have a lexiscan myoview. For further information please visit [email or web address] Please follow instruction sheet, as given.

## 2011-05-07 NOTE — Progress Notes (Signed)
[address] [place], [place]  [address] Phone: [phone] Fax:  [phone]  Date:  05/07/2011   Name:  Brad [name]       DOB:  10/18/1927 MRN:  [id number]  PCP:  Dr. Christell Constant Primary Cardiologist:  Dr. Dietrich Pates  Primary Electrophysiologist:  None    History of Present Illness: Brad [name] is a 77 y.o. male presents for arm and hand numbness.  He has a h/o CAD, HLP, PAD, DJD, COPD, HTN, GERD.  He is s/p AAA repair.  He has an ascending aortic aneurysm that is followed by Dr. Dorris Fetch.  Last CT scan of the chest done 10/12 with stable findings.  He has a h/o PCI to OM2.  Last LHC 3/05: EF 60%, left RA occluded, right RA ok, prox to mid LAD 60%, oD1 75-80%, oD2 small 75%, pOM1 50%, OM2 stent ok with 50% before stent, mRCA 30%, dRCA 30%, PDA 70%.  Last dobutamine myoview 5/06: no scar or ischemia, EF 60%.  Last echo 1/07:  Normal LVF, LV thickness upper limits of normal, mild aortic root dilatation, mild to mod MAC, mild LAE, borderline RVH, mild RAE.  Last seen by Dr. Dietrich Pates in 6/11.    He has had problems with bilateral wrist and hand pain over the last 4 weeks.  He saw an orthopedist who thought he had carpal tunnel.  He decided to seek a second opinion with a hand specialist.  That appointment is later this week.  He was concerned that there may be a cardiac cause of his symptoms and set up this appointment today.  He notes some wrist pain prior to his stent many years ago.  This is not the same now.  He does note some increase in his symptoms with exertion.  He did fall about 3-4 weeks ago.  Initially he described this as a syncopal spell.  However, he remembers every aspect of the fall.  He does not remember antecedent dizziness or near syncope.  He denies chest discomfort.  He has chronic dyspnea with exertion.  He denies orthopnea, PND.  He has chronic ankle edema without significant change.  He denies palpitations.  Past Medical History  Diagnosis  Date  . HTN (hypertension)     Last echo 1/07:  Normal LVF, LV thickness upper limits of normal, mild aortic root dilatation, mild to mod MAC, mild LAE, borderline RVH, mild RAE.  . Tobacco abuse   . Ethanolism   . COPD (chronic obstructive pulmonary disease)   . Unilateral congenital absence of kidney   . CAD (coronary artery disease)     s/p stent to OM2 in past;  Last LHC 3/05: EF 60%, left RA occluded, right RA ok, prox to mid LAD 60%, oD1 75-80%, oD2 small 75%, pOM1 50%, OM2 stent ok with 50% before stent, mRCA 30%, dRCA 30%, PDA 70%.  Last dobutamine myoview 5/06: no scar or ischemia, EF 60%.  Marland Kitchen HLD (hyperlipidemia)   . PAD (peripheral artery disease)   . S/P AAA repair   . Ascending aortic aneurysm     Dr. Dorris Fetch  . DJD (degenerative joint disease)   . GERD (gastroesophageal reflux disease)     Current Outpatient Prescriptions  Medication Sig Dispense Refill  . amLODipine (NORVASC) 5 MG tablet Take 1 tablet (5 mg total) by mouth daily.  30 tablet  6  . cholecalciferol (VITAMIN D) 1000 UNITS tablet Take 1,000 Units by mouth daily.        Marland Kitchen  clopidogrel (PLAVIX) 75 MG tablet Take 1 tablet (75 mg total) by mouth daily.  30 tablet  6  . fish oil-omega-3 fatty acids 1000 MG capsule Take 1 g by mouth daily.        . furosemide (LASIX) 20 MG tablet Take 20 mg by mouth 2 (two) times daily.        . isosorbide mononitrate (IMDUR) 30 MG 24 hr tablet Take 30 mg by mouth daily.        Marland Kitchen KLOR-CON M20 20 MEQ tablet TAKE 1 TABLET EVERY DAY  30 tablet  2  . metoprolol succinate (TOPROL-XL) 25 MG 24 hr tablet Take 25 mg by mouth daily.        . rosuvastatin (CRESTOR) 10 MG tablet Take 10 mg by mouth daily.          Allergies: Allergies  Allergen Reactions  . Aspirin   . Atorvastatin   . Cephalexin     History  Substance Use Topics  . Smoking status: Former Games developer  . Smokeless tobacco: Not on file  . Alcohol Use: Yes     ROS:  Please see the history of present illness.  He  reports poor sleep patterns recently due to taking care of his ill wife.  All other systems reviewed and negative.   PHYSICAL EXAM: VS:  BP 122/74  Pulse 53  Ht 5\' 8"  (1.727 m)  Wt 200 lb (90.719 kg)  BMI 30.41 kg/m2 Well nourished, well developed, in no acute distress HEENT: normal Neck: no JVD Vascular: No carotid bruits Cardiac:  Distant S1, S2; RRR; no murmur Lungs:  Decreased breath sounds bilaterally, no wheezing, rhonchi or rales Abd: soft, nontender, no hepatomegaly Ext: 1+ bilateral ankle edema MSK: Tinel's and Phalen's test positive bilaterally Skin: warm and dry Neuro:  CNs 2-12 intact, no focal abnormalities noted Psych: Normal affect.  EKG:   Sinus bradycardia, heart rate 53, left axis deviation, nonspecific ST-T wave changes, no significant change when compared to prior tracings  ASSESSMENT AND PLAN:

## 2011-05-07 NOTE — Assessment & Plan Note (Signed)
Wrist symptoms and recent fall do not sound typical for angina.  However he seems to remember some type of wrist pain around the time of his stent.  It has been 6 years since his last stress test.  I will set him up for [facility].  Followup with Dr. [name] in 3 or 4 weeks.

## 2011-05-07 NOTE — Assessment & Plan Note (Signed)
Controlled.  

## 2011-05-07 NOTE — Assessment & Plan Note (Addendum)
Symptoms consistent with this diagnosis.  He will see the hand surgeon later this week for definintive diagnosis and treatment.  I suggested he go ahead and get carpal tunnel wrist braces at his local pharmacy to wear at bedtime to see if this helps.

## 2011-05-07 NOTE — Assessment & Plan Note (Signed)
He had a recent fall.  It sounds as though he lost his balance.  He does not really report any symptoms that sound consistent with syncope.  He denies any symptoms of orthostasis.  He is somewhat bradycardic.  I will cut his Toprol in half.  I will also have him wear a 48 hour Holter.  Followup with Dr. [name] in 3-4 weeks.

## 2011-05-09 ENCOUNTER — Ambulatory Visit (HOSPITAL_COMMUNITY): Payer: Medicare Other | Attending: Physician Assistant | Admitting: Radiology

## 2011-05-09 ENCOUNTER — Encounter (INDEPENDENT_AMBULATORY_CARE_PROVIDER_SITE_OTHER): Payer: Medicare Other

## 2011-05-09 VITALS — BP 113/74 | Ht 68.0 in | Wt 196.0 lb

## 2011-05-09 DIAGNOSIS — R55 Syncope and collapse: Secondary | ICD-10-CM | POA: Insufficient documentation

## 2011-05-09 DIAGNOSIS — R0989 Other specified symptoms and signs involving the circulatory and respiratory systems: Secondary | ICD-10-CM | POA: Insufficient documentation

## 2011-05-09 DIAGNOSIS — I10 Essential (primary) hypertension: Secondary | ICD-10-CM | POA: Insufficient documentation

## 2011-05-09 DIAGNOSIS — I2510 Atherosclerotic heart disease of native coronary artery without angina pectoris: Secondary | ICD-10-CM

## 2011-05-09 DIAGNOSIS — I739 Peripheral vascular disease, unspecified: Secondary | ICD-10-CM | POA: Insufficient documentation

## 2011-05-09 DIAGNOSIS — J4489 Other specified chronic obstructive pulmonary disease: Secondary | ICD-10-CM | POA: Insufficient documentation

## 2011-05-09 DIAGNOSIS — J449 Chronic obstructive pulmonary disease, unspecified: Secondary | ICD-10-CM | POA: Insufficient documentation

## 2011-05-09 DIAGNOSIS — R42 Dizziness and giddiness: Secondary | ICD-10-CM

## 2011-05-09 DIAGNOSIS — R0609 Other forms of dyspnea: Secondary | ICD-10-CM | POA: Insufficient documentation

## 2011-05-09 DIAGNOSIS — R0602 Shortness of breath: Secondary | ICD-10-CM

## 2011-05-09 DIAGNOSIS — E785 Hyperlipidemia, unspecified: Secondary | ICD-10-CM | POA: Insufficient documentation

## 2011-05-09 MED ORDER — TECHNETIUM TC 99M TETROFOSMIN IV KIT
30.0000 | PACK | Freq: Once | INTRAVENOUS | Status: AC | PRN
  Administered 2011-05-09: 30 via INTRAVENOUS

## 2011-05-09 MED ORDER — TECHNETIUM TC 99M TETROFOSMIN IV KIT
10.0000 | PACK | Freq: Once | INTRAVENOUS | Status: AC | PRN
  Administered 2011-05-09: 10 via INTRAVENOUS

## 2011-05-09 MED ORDER — REGADENOSON 0.4 MG/5ML IV SOLN
0.4000 mg | Freq: Once | INTRAVENOUS | Status: AC
  Administered 2011-05-09: 0.4 mg via INTRAVENOUS

## 2011-05-09 NOTE — Progress Notes (Signed)
[facility] SITE 3 NUCLEAR MED [address] [place] [address] [phone]  Cardiology Nuclear Med Study  Brad [name] is a 77 y.o. male [id number] 01/13/1928   Nuclear Med Background Indication for Stress Test:  Evaluation for Ischemia and Stent Patency History:  COPD,'07 Echo, '05Heart Catheterization: EF 60%, '06 Myocardial Perfusion Study: EF 60% (-) ischemia and 2nd OM Stents: prior to cath 05  Cardiac Risk Factors: Hypertension, Lipids and PVD  Symptoms:  DOE and Syncope   Nuclear Pre-Procedure Caffeine/Decaff Intake:  None NPO After: 4:30am   Lungs: clear IV 0.9% NS with Angio Cath:  22g  IV Site: R Hand  IV Started by:  Stanton Kidney, EMT-P  Chest Size (in):  42 Cup Size: n/a  Height: 5\' 8"  (1.727 m)  Weight:  196 lb (88.905 kg)  BMI:  Body mass index is 29.80 kg/(m^2). Tech Comments:  Toprol held > 12 hours, per patient.    Nuclear Med Study 1 or 2 day study: 1 day  Stress Test Type:  Eugenie Birks  Reading MD: Olga Millers, MD  Order Authorizing Provider:  P.Ross/S.Weaver  Resting Radionuclide: Technetium 10m Tetrofosmin  Resting Radionuclide Dose: 10.7 mCi   Stress Radionuclide:  Technetium 67m Tetrofosmin  Stress Radionuclide Dose: 33.0 mCi           Stress Protocol Rest HR: 55 Stress HR: 78  Rest BP: 113/74 Stress BP: 129/75  Exercise Time (min): n/a METS: n/a   Predicted Max HR: 139 bpm % Max HR: 56.12 bpm Rate Pressure Product: 21308   Dose of Adenosine (mg):  n/a Dose of Lexiscan: 0.4 mg  Dose of Atropine (mg): n/a Dose of Dobutamine: n/a mcg/kg/min (at max HR)  Stress Test Technologist: Milana Na, EMT-P  Nuclear Technologist:  Domenic Polite, CNMT     Rest Procedure:  Myocardial perfusion imaging was performed at rest 45 minutes following the intravenous administration of Technetium 20m Tetrofosmin. Rest ECG: Sinus Bradycardia  Stress Procedure:  The patient received IV Lexiscan 0.4 mg over 15-seconds.  Technetium 41m  Tetrofosmin injected at 30-seconds.  There were no significant changes, + sob, and chest tightness  with Lexiscan.  Quantitative spect images were obtained after a 45 minute delay. Stress ECG: No significant ST segment change suggestive of ischemia.  QPS Raw Data Images:  Acquisition technically good; mild LVE. Stress Images:  There is decreased uptake in the inferior wall and apex. Rest Images:  There is decreased uptake in the inferior wall, less prominent compared to the stress images. Subtraction (SDS):  These findings are consistent with inferior and apical ischemia. Transient Ischemic Dilatation (Normal <1.22):  1.20 Lung/Heart Ratio (Normal <0.45):  0.38  Quantitative Gated Spect Images QGS EDV:  117 ml QGS ESV:  55 ml QGS cine images:  NL LV Function; NL Wall Motion QGS EF: 53%  Impression Exercise Capacity:  Lexiscan with no exercise. BP Response:  Normal blood pressure response. Clinical Symptoms:  There is chest pain. ECG Impression:  No significant ST segment change suggestive of ischemia. Comparison with Prior Nuclear Study: No images to compare  Overall Impression:  Abnormal stress nuclear study with a small, partially reversible inferior defect and a small reversible apical defect consistent with inferior thinning and mild ischemia in the inferior and apical walls; there also appears to be transient ischemic dilatation of the LV cavity.    Olga Millers

## 2011-05-10 ENCOUNTER — Telehealth: Payer: Self-pay | Admitting: Physician Assistant

## 2011-05-10 ENCOUNTER — Other Ambulatory Visit: Payer: Self-pay

## 2011-05-10 MED ORDER — NITROGLYCERIN 0.4 MG SL SUBL: 0.4000 mg | SUBLINGUAL_TABLET | SUBLINGUAL | Status: DC | PRN

## 2011-05-10 NOTE — Telephone Encounter (Signed)
MYOVIEW DONE 05/09/11: Quantitative Gated Spect Images  QGS EDV: 117 ml  QGS ESV: 55 ml  QGS cine images: NL LV Function; NL Wall Motion  QGS EF: 53%  Impression  Exercise Capacity: Lexiscan with no exercise.  BP Response: Normal blood pressure response.  Clinical Symptoms: There is chest pain.  ECG Impression: No significant ST segment change suggestive of ischemia.  Comparison with Prior Nuclear Study: No images to compare  Overall Impression: Abnormal stress nuclear study with a small, partially reversible inferior defect and a small reversible apical defect consistent with inferior thinning and mild ischemia in the inferior and apical walls; there also appears to be transient ischemic dilatation of the LV cavity.   Myoview abnormal. Please bring him in to see Dr. Dietrich Pates or me on a day she is here to discuss findings.   Make sure he has NTG to use prn. Go to ED if he has chest pain. Tereso Newcomer, PA-C  2:33 PM 05/10/2011

## 2011-05-10 NOTE — Telephone Encounter (Signed)
Patient was called and told myoview 05/09/11 abnormal.Appointment was scheduled with Tereso Newcomer PA 05/27/10.NTG 0.4 mg SL sent to [facility].Advised if has chest pain and NTG don't relieve go to ER.Also advised call back if needed.

## 2011-05-24 ENCOUNTER — Other Ambulatory Visit: Payer: Self-pay | Admitting: Internal Medicine

## 2011-05-27 DIAGNOSIS — G5600 Carpal tunnel syndrome, unspecified upper limb: Secondary | ICD-10-CM | POA: Diagnosis not present

## 2011-05-27 DIAGNOSIS — G5620 Lesion of ulnar nerve, unspecified upper limb: Secondary | ICD-10-CM | POA: Diagnosis not present

## 2011-05-28 ENCOUNTER — Ambulatory Visit (INDEPENDENT_AMBULATORY_CARE_PROVIDER_SITE_OTHER): Payer: Medicare Other | Admitting: Physician Assistant

## 2011-05-28 ENCOUNTER — Encounter: Payer: Self-pay | Admitting: Physician Assistant

## 2011-05-28 VITALS — BP 122/76 | HR 57 | Ht 68.0 in | Wt 199.0 lb

## 2011-05-28 DIAGNOSIS — G5600 Carpal tunnel syndrome, unspecified upper limb: Secondary | ICD-10-CM | POA: Diagnosis not present

## 2011-05-28 DIAGNOSIS — I2510 Atherosclerotic heart disease of native coronary artery without angina pectoris: Secondary | ICD-10-CM | POA: Diagnosis not present

## 2011-05-28 DIAGNOSIS — G5603 Carpal tunnel syndrome, bilateral upper limbs: Secondary | ICD-10-CM

## 2011-05-28 DIAGNOSIS — I498 Other specified cardiac arrhythmias: Secondary | ICD-10-CM

## 2011-05-28 DIAGNOSIS — R001 Bradycardia, unspecified: Secondary | ICD-10-CM | POA: Insufficient documentation

## 2011-05-28 NOTE — Assessment & Plan Note (Signed)
Doing well.  Myoview not a high risk scan.  I spoke with Dr. Dietrich Pates by telephone today (patient was no scheduled on day she was in office as requested).  No need to pursue further testing at this time.  Follow up with Dr. Dietrich Pates in 3-4 months or sooner PRN.

## 2011-05-28 NOTE — Assessment & Plan Note (Signed)
Given significant findings on testing, this is likely the cause of all his arm symptoms.  He will follow up with ortho as directed.

## 2011-05-28 NOTE — Assessment & Plan Note (Signed)
He is not symptomatic.  No further workup or change in therapy.

## 2011-05-28 NOTE — Progress Notes (Signed)
[address] [place], [place]  [address] Phone: [phone] Fax:  [phone]  Date:  05/28/2011   Name:  Brad [name]       DOB:  [birth year] MRN:  [id number]  PCP:  Dr. Christell Constant Primary Cardiologist:  Dr. Dietrich Pates  Primary Electrophysiologist:  None    History of Present Illness: Brad [name] is a 78 y.o. male presents for follow up.  He has a h/o CAD, HLP, PAD, DJD, COPD, HTN, GERD.  He is s/p AAA repair.  He has an ascending aortic aneurysm that is followed by Dr. Dorris Fetch.  Last CT scan of the chest done 10/12 with stable findings.  He has a h/o PCI to OM2.  Last LHC 3/05: EF 60%, left RA occluded, right RA ok, prox to mid LAD 60%, oD1 75-80%, oD2 small 75%, pOM1 50%, OM2 stent ok with 50% before stent, mRCA 30%, dRCA 30%, PDA 70%.  Last dobutamine myoview 5/06: no scar or ischemia, EF 60%.  Last echo 1/07:  Normal LVF, LV thickness upper limits of normal, mild aortic root dilatation, mild to mod MAC, mild LAE, borderline RVH, mild RAE.  Last seen by Dr. Dietrich Pates in 6/11.    I saw him 05/07/11 with wrist pain that reminded him of prior angina.  However, his symptoms were most c/w carpal tunnel.  He also noted dizziness.  I set him up with a myoview and a Holter.  He was to see a Hydrographic surveyor later in the week.  His myoview was abnormal as noted below.  Dr. Dietrich Pates reviewed the findings and called the patient at home.  He was feeling better at that point and she noted the scan was not high risk.  Medical Rx was continue.  Holter was also reviewed by Dr. Dietrich Pates and demonstrated an avg HR of 65 and longest pause of 2.6 seconds.  He returns for follow up.    He has seen Dr. Merlyn Lot and was told he had cervical spine, elbow and wrist DJD.  Also saw Dr. Ophelia Charter.  EMG confirmed CTS.  Surgery is an option but he is not sure he wants to do that.  He denies chest pain, dyspnea, orthopnea, PND.  Ankle edema is stable.  No syncope.  He will get lightheaded with  standing too quickly.  But, denies near syncope.    Past Medical History  Diagnosis Date  . HTN (hypertension)     Last echo 1/07:  Normal LVF, LV thickness upper limits of normal, mild aortic root dilatation, mild to mod MAC, mild LAE, borderline RVH, mild RAE.  . Tobacco abuse   . Ethanolism   . COPD (chronic obstructive pulmonary disease)   . Unilateral congenital absence of kidney   . CAD (coronary artery disease)     s/p stent to OM2 in past;  Last LHC 3/05: EF 60%, left RA occluded, right RA ok, prox to mid LAD 60%, oD1 75-80%, oD2 small 75%, pOM1 50%, OM2 stent ok with 50% before stent, mRCA 30%, dRCA 30%, PDA 70%.  Last dobutamine myoview 5/06: no scar or ischemia, EF 60%.  Marland Kitchen HLD (hyperlipidemia)   . PAD (peripheral artery disease)   . S/P AAA repair   . Ascending aortic aneurysm     Dr. Dorris Fetch  . DJD (degenerative joint disease)   . GERD (gastroesophageal reflux disease)     Current Outpatient Prescriptions  Medication Sig Dispense Refill  . amLODipine (NORVASC) 5 MG  tablet Take 1 tablet (5 mg total) by mouth daily.  30 tablet  6  . cholecalciferol (VITAMIN D) 1000 UNITS tablet Take 1,000 Units by mouth daily.        . clopidogrel (PLAVIX) 75 MG tablet Take 1 tablet (75 mg total) by mouth daily.  30 tablet  6  . fish oil-omega-3 fatty acids 1000 MG capsule Take 1 g by mouth daily.        . furosemide (LASIX) 20 MG tablet 1 TABLET BY MOUTH TWO TIMES A DAY  60 tablet  5  . isosorbide mononitrate (IMDUR) 30 MG 24 hr tablet Take 30 mg by mouth daily.        Marland Kitchen KLOR-CON M20 20 MEQ tablet TAKE 1 TABLET EVERY DAY  30 tablet  2  . metoprolol succinate (TOPROL-XL) 25 MG 24 hr tablet Take 0.5 tablets (12.5 mg total) by mouth daily.  45 tablet  2  . nitroGLYCERIN (NITROSTAT) 0.4 MG SL tablet Place 1 tablet (0.4 mg total) under the tongue every 5 (five) minutes as needed for chest pain.  25 tablet  12  . rosuvastatin (CRESTOR) 10 MG tablet Take 10 mg by mouth daily.           Allergies: Allergies  Allergen Reactions  . Aspirin   . Atorvastatin   . Cephalexin     History  Substance Use Topics  . Smoking status: Former Smoker    Quit date: 05/06/1996  . Smokeless tobacco: Never Used  . Alcohol Use: Yes     PHYSICAL EXAM: VS:  BP 122/76  Pulse 57  Ht 5\' 8"  (1.727 m)  Wt 199 lb (90.266 kg)  BMI 30.26 kg/m2 Well nourished, well developed, in no acute distress HEENT: normal Neck: no JVD Cardiac:  Distant S1, S2; RRR; no murmur Lungs:  Decreased breath sounds bilaterally, no wheezing, rhonchi or rales Abd: soft, nontender, no hepatomegaly Ext: 1+ bilateral ankle edema Skin: warm and dry Neuro:  CNs 2-12 intact, no focal abnormalities noted Psych: Normal affect.  EKG:    Sinus brady, HR 57, LAD, NSSTTW changes, no significant change from prior  Lexiscan Myoview 05/09/11: Impression  Exercise Capacity: Lexiscan with no exercise.  BP Response: Normal blood pressure response.  Clinical Symptoms: There is chest pain.  ECG Impression: No significant ST segment change suggestive of ischemia.  Comparison with Prior Nuclear Study: No images to compare  EF 53%  Overall Impression: Abnormal stress nuclear study with a small, partially reversible inferior defect and a small reversible apical defect consistent with inferior thinning and mild ischemia in the inferior and apical walls; there also appears to be transient ischemic dilatation of the LV cavity.  ASSESSMENT AND PLAN:

## 2011-05-28 NOTE — Patient Instructions (Signed)
Your physician wants you to follow-up with Dr. [name] in 3 to 4 months. You will receive a reminder letter in the mail two months in advance. If you don't receive a letter, please call our office to schedule the follow-up appointment.

## 2011-06-05 ENCOUNTER — Other Ambulatory Visit: Payer: Self-pay | Admitting: Internal Medicine

## 2011-06-05 MED ORDER — AMLODIPINE BESYLATE 5 MG PO TABS: 5.0000 mg | ORAL_TABLET | Freq: Every day | ORAL | Status: DC

## 2011-06-05 NOTE — Telephone Encounter (Signed)
Refill   Brad [name]  - [facility] [place], [place]     Pharmacy Calling for patient refill   amLODipine (NORVASC) 5 MG tablet

## 2011-06-10 ENCOUNTER — Ambulatory Visit: Payer: Medicare Other | Admitting: Internal Medicine

## 2011-06-11 DIAGNOSIS — G5600 Carpal tunnel syndrome, unspecified upper limb: Secondary | ICD-10-CM | POA: Diagnosis not present

## 2011-06-19 ENCOUNTER — Other Ambulatory Visit: Payer: Self-pay | Admitting: Internal Medicine

## 2011-06-20 MED ORDER — CLOPIDOGREL BISULFATE 75 MG PO TABS: 75.0000 mg | ORAL_TABLET | Freq: Every day | ORAL | Status: DC

## 2011-08-06 ENCOUNTER — Other Ambulatory Visit: Payer: Self-pay | Admitting: Internal Medicine

## 2011-09-05 ENCOUNTER — Telehealth: Payer: Self-pay | Admitting: Internal Medicine

## 2011-09-05 ENCOUNTER — Encounter: Payer: Self-pay | Admitting: Internal Medicine

## 2011-09-05 ENCOUNTER — Ambulatory Visit (INDEPENDENT_AMBULATORY_CARE_PROVIDER_SITE_OTHER): Payer: Medicare Other | Admitting: Internal Medicine

## 2011-09-05 VITALS — BP 150/80 | HR 54 | Ht 68.0 in | Wt 204.0 lb

## 2011-09-05 DIAGNOSIS — E782 Mixed hyperlipidemia: Secondary | ICD-10-CM

## 2011-09-05 DIAGNOSIS — R0602 Shortness of breath: Secondary | ICD-10-CM

## 2011-09-05 DIAGNOSIS — I2510 Atherosclerotic heart disease of native coronary artery without angina pectoris: Secondary | ICD-10-CM | POA: Diagnosis not present

## 2011-09-05 LAB — BASIC METABOLIC PANEL
CO2: 27 mEq/L (ref 19–32)
Calcium: 9 mg/dL (ref 8.4–10.5)
Creatinine, Ser: 0.9 mg/dL (ref 0.4–1.5)

## 2011-09-05 LAB — AST: AST: 20 U/L (ref 0–37)

## 2011-09-05 NOTE — Patient Instructions (Signed)
Lab work today We will call you with results.  Your physician wants you to follow-up in: December 2013 You will receive a reminder letter in the mail two months in advance. If you don't receive a letter, please call our office to schedule the follow-up appointment.  

## 2011-09-05 NOTE — Telephone Encounter (Signed)
Walk in pt Form " Pt has Questions Reguarding Meds" sent to [facility] 09/05/11/KM

## 2011-09-05 NOTE — Progress Notes (Signed)
HPI Brad [name] is an 78 year old with a hsitory of CAD, dyslipidemai, PAD, DJD, COPD, HTN.  GERD.  S/p AAA repari.  Last cath in March 2005:  LVEF 60%.  L RA occluded.  R RA ok.  60% prox/mid LD; 75 ot80% D1, 75% small D2, OM2 stent OK  50% prior; PDA 70%.  Dobbutamine myoview 5/06:  NO ischemia or scar.  Echo 1/07:  Normal LVEF    Mild aortic root dlitation.   Last seen by Wende Mott in January.  Since seen his hand is doing better.  Attrib to using walker. Breathing is better.   Labs are followed at Dr. Kathi Der office. No dizziness or syncope.  Breathing OK but not doing much. Allergies  Allergen Reactions  . Aspirin   . Atorvastatin   . Cephalexin     Current Outpatient Prescriptions  Medication Sig Dispense Refill  . amLODipine (NORVASC) 5 MG tablet Take 1 tablet (5 mg total) by mouth daily.  30 tablet  6  . cholecalciferol (VITAMIN D) 1000 UNITS tablet Take 1,000 Units by mouth daily.        . clopidogrel (PLAVIX) 75 MG tablet Take 1 tablet (75 mg total) by mouth daily.  30 tablet  6  . fish oil-omega-3 fatty acids 1000 MG capsule Take 1 g by mouth daily.        . furosemide (LASIX) 20 MG tablet       . isosorbide mononitrate (IMDUR) 30 MG 24 hr tablet Take 30 mg by mouth daily.        Marland Kitchen KLOR-CON M20 20 MEQ tablet TAKE 1 TABLET EVERY DAY  30 tablet  2  . metoprolol succinate (TOPROL-XL) 25 MG 24 hr tablet Take 0.5 tablets (12.5 mg total) by mouth daily.  45 tablet  2  . niacin (NIASPAN) 500 MG CR tablet Take 500 mg by mouth at bedtime.      . nitroGLYCERIN (NITROSTAT) 0.4 MG SL tablet Place 1 tablet (0.4 mg total) under the tongue every 5 (five) minutes as needed for chest pain.  25 tablet  12  . rosuvastatin (CRESTOR) 10 MG tablet Take 10 mg by mouth daily.        Marland Kitchen DISCONTD: furosemide (LASIX) 20 MG tablet 1 TABLET BY MOUTH TWO TIMES A DAY  60 tablet  5    Past Medical History  Diagnosis Date  . HTN (hypertension)     Last echo 1/07:  Normal LVF, LV thickness upper limits of  normal, mild aortic root dilatation, mild to mod MAC, mild LAE, borderline RVH, mild RAE.  . Tobacco abuse   . Ethanolism   . COPD (chronic obstructive pulmonary disease)   . Unilateral congenital absence of kidney   . CAD (coronary artery disease)     s/p stent to OM2 in past;  Last LHC 3/05: EF 60%, left RA occluded, right RA ok, prox to mid LAD 60%, oD1 75-80%, oD2 small 75%, pOM1 50%, OM2 stent ok with 50% before stent, mRCA 30%, dRCA 30%, PDA 70%.  Last dobutamine myoview 5/06: no scar or ischemia, EF 60%.  Marland Kitchen HLD (hyperlipidemia)   . PAD (peripheral artery disease)   . S/P AAA repair   . Ascending aortic aneurysm     Dr. Dorris Fetch  . DJD (degenerative joint disease)   . GERD (gastroesophageal reflux disease)     Past Surgical History  Procedure Date  . Appendectomy   . Hernia repair   . Resection and grafting of  infarenal  abdominal  aortic aneurysm 05/03/2002    No family history on file.  History   Social History  . Marital Status: Married    Spouse Name: N/A    Number of Children: N/A  . Years of Education: N/A   Occupational History  . Not on file.   Social History Main Topics  . Smoking status: Former Smoker    Quit date: 05/06/1996  . Smokeless tobacco: Never Used  . Alcohol Use: Yes  . Drug Use: No  . Sexually Active: Not on file   Other Topics Concern  . Not on file   Social History Narrative  . No narrative on file    Review of Systems:  All systems reviewed.  They are negative to the above problem except as previously stated.  Vital Signs: BP 150/80  Pulse 54  Ht 5\' 8"  (1.727 m)  Wt 204 lb (92.534 kg)  BMI 31.02 kg/m2  Physical Exam  Patient in NAD> No SOB>    HEENT:  Normocephalic, atraumatic. EOMI, PERRLA.  Neck: JVP is normal. No thyromegaly. No bruits.  Lungs: clear to auscultation. No rales no wheezes.  Heart: Regular rate and rhythm. Normal S1, S2. No S3.   No significant murmurs. PMI not displaced.  Abdomen:  Supple,  nontender. Normal bowel sounds. No masses. No hepatomegaly.  Extremities:   Good distal pulses throughout. 1+  lower extremity edema.  Musculoskeletal :moving all extremities.  Neuro:   alert and oriented x3.  CN II-XII grossly intact.   Assessment and Plan: 1.  CAD  No symptoms of angina. Keep on same regimen  2.  Dyslipidemia>  Check lipomed  3.  HTN>  Check BMET and BNP.  Did not take meds today. Edema may be due to venous insuff.  Overal volume status looks good.  4.Hand pain.  Resolved.

## 2011-09-05 NOTE — Telephone Encounter (Signed)
Fixed medication list from today's visit. Will mail labs when available.

## 2011-09-05 NOTE — Telephone Encounter (Signed)
New msg Pt's daughter called and he is not taking these meds Not taking vitamin d Not taking niaspan 500 mg  Also they want copy of lipid results

## 2011-09-09 LAB — NMR LIPOPROFILE WITH LIPIDS
HDL Particle Number: 31.9 umol/L (ref >=30.5)
HDL-C: 51 mg/dL (ref >=40)
LDL Size: 20.7 nm (ref >20.5)
Small LDL Particle Number: 578 nmol/L — ABNORMAL HIGH (ref <=527)

## 2011-11-04 ENCOUNTER — Other Ambulatory Visit: Payer: Self-pay | Admitting: Internal Medicine

## 2011-11-08 ENCOUNTER — Emergency Department (HOSPITAL_COMMUNITY)
Admission: EM | Admit: 2011-11-08 | Discharge: 2011-11-08 | Disposition: A | Discharge status: Home / Self Care | Payer: Medicare Other | Attending: Emergency Medicine | Admitting: Emergency Medicine

## 2011-11-08 ENCOUNTER — Encounter (HOSPITAL_COMMUNITY): Payer: Self-pay | Admitting: *Deleted

## 2011-11-08 DIAGNOSIS — Z87891 Personal history of nicotine dependence: Secondary | ICD-10-CM | POA: Diagnosis not present

## 2011-11-08 DIAGNOSIS — E785 Hyperlipidemia, unspecified: Secondary | ICD-10-CM | POA: Insufficient documentation

## 2011-11-08 DIAGNOSIS — K219 Gastro-esophageal reflux disease without esophagitis: Secondary | ICD-10-CM | POA: Insufficient documentation

## 2011-11-08 DIAGNOSIS — R002 Palpitations: Secondary | ICD-10-CM | POA: Diagnosis not present

## 2011-11-08 DIAGNOSIS — I10 Essential (primary) hypertension: Secondary | ICD-10-CM | POA: Insufficient documentation

## 2011-11-08 DIAGNOSIS — I2510 Atherosclerotic heart disease of native coronary artery without angina pectoris: Secondary | ICD-10-CM | POA: Insufficient documentation

## 2011-11-08 DIAGNOSIS — Z9861 Coronary angioplasty status: Secondary | ICD-10-CM | POA: Diagnosis not present

## 2011-11-08 DIAGNOSIS — J449 Chronic obstructive pulmonary disease, unspecified: Secondary | ICD-10-CM | POA: Diagnosis not present

## 2011-11-08 DIAGNOSIS — J4489 Other specified chronic obstructive pulmonary disease: Secondary | ICD-10-CM | POA: Insufficient documentation

## 2011-11-08 DIAGNOSIS — E039 Hypothyroidism, unspecified: Secondary | ICD-10-CM | POA: Diagnosis not present

## 2011-11-08 DIAGNOSIS — I498 Other specified cardiac arrhythmias: Secondary | ICD-10-CM | POA: Insufficient documentation

## 2011-11-08 DIAGNOSIS — R001 Bradycardia, unspecified: Secondary | ICD-10-CM

## 2011-11-08 LAB — BASIC METABOLIC PANEL
CO2: 23 mEq/L (ref 19–32)
Calcium: 9.5 mg/dL (ref 8.4–10.5)
Chloride: 104 mEq/L (ref 96–112)
Creatinine, Ser: 0.72 mg/dL (ref 0.50–1.35)
Glucose, Bld: 97 mg/dL (ref 70–99)
Sodium: 138 mEq/L (ref 135–145)

## 2011-11-08 LAB — BASIC METABOLIC PANEL WITH GFR
BUN: 17 mg/dL (ref 6–23)
GFR calc Af Amer: >90 mL/min (ref >90)
GFR calc non Af Amer: 85 mL/min — ABNORMAL LOW (ref >90)
Potassium: 4.2 meq/L (ref 3.5–5.1)

## 2011-11-08 NOTE — ED Provider Notes (Addendum)
History     CSN: [id number]  Arrival date & time 11/08/11  1122   First MD Initiated Contact with Patient 11/08/11 1145      Chief Complaint  Patient presents with  . Bradycardia    (Consider location/radiation/quality/duration/timing/severity/associated sxs/prior treatment) HPI Comments: Pt has felt generally weak intermittently recently, had routine appt with PCP today, blood work done, ECG done showing certain changes.  He had no CP, SOB, no syncope, no back pain, abd pain.  No recent N/V/D.  No fevers, coughing.  Pt has had slowed HR in the past, was evaluated by his cardiologist in the past and has another appt in December with Dr. [name].  He has worn a holter monitor based on family description in the past.  He feels at baseline now.  No new changes to medications.  His meds include norvasc and metoprolol.    The history is provided by the patient, a relative and medical records.    Past Medical History  Diagnosis Date  . HTN (hypertension)     Last echo 1/07:  Normal LVF, LV thickness upper limits of normal, mild aortic root dilatation, mild to mod MAC, mild LAE, borderline RVH, mild RAE.  . Tobacco abuse   . Ethanolism   . COPD (chronic obstructive pulmonary disease)   . Unilateral congenital absence of kidney   . CAD (coronary artery disease)     s/p stent to OM2 in past;  Last LHC 3/05: EF 60%, left RA occluded, right RA ok, prox to mid LAD 60%, oD1 75-80%, oD2 small 75%, pOM1 50%, OM2 stent ok with 50% before stent, mRCA 30%, dRCA 30%, PDA 70%.  Last dobutamine myoview 5/06: no scar or ischemia, EF 60%.  Marland Kitchen HLD (hyperlipidemia)   . PAD (peripheral artery disease)   . S/P AAA repair   . Ascending aortic aneurysm     Dr. Dorris Fetch  . DJD (degenerative joint disease)   . GERD (gastroesophageal reflux disease)     Past Surgical History  Procedure Date  . Appendectomy   . Hernia repair   . Resection and grafting of infarenal  abdominal  aortic aneurysm 05/03/2002     History reviewed. No pertinent family history.  History  Substance Use Topics  . Smoking status: Former Smoker    Quit date: 05/06/1996  . Smokeless tobacco: Never Used  . Alcohol Use: Yes      Review of Systems  Constitutional: Positive for fatigue.  Respiratory: Negative for chest tightness and shortness of breath.   Cardiovascular: Negative for chest pain.  Gastrointestinal: Negative for nausea, vomiting, abdominal pain and diarrhea.  Musculoskeletal: Negative for back pain.  Skin: Negative for color change.  Neurological: Positive for weakness. Negative for dizziness, light-headedness, numbness and headaches.  All other systems reviewed and are negative.    Allergies  Aspirin; Atorvastatin; and Cephalexin  Home Medications   Current Outpatient Rx  Name Route Sig Dispense Refill  . AMLODIPINE BESYLATE 5 MG PO TABS Oral Take 1 tablet (5 mg total) by mouth daily. 30 tablet 6  . CLOPIDOGREL BISULFATE 75 MG PO TABS Oral Take 1 tablet (75 mg total) by mouth daily. 30 tablet 6  . OMEGA-3 FATTY ACIDS 1000 MG PO CAPS Oral Take 1 g by mouth daily.      . FUROSEMIDE 20 MG PO TABS Oral Take 20 mg by mouth daily.     . ISOSORBIDE MONONITRATE ER 30 MG PO TB24 Oral Take 30 mg by mouth daily.      Marland Kitchen  METOPROLOL SUCCINATE ER 25 MG PO TB24 Oral Take 25 mg by mouth daily.    Marland Kitchen NITROGLYCERIN 0.4 MG SL SUBL Sublingual Place 1 tablet (0.4 mg total) under the tongue every 5 (five) minutes as needed for chest pain. 25 tablet 12  . POTASSIUM CHLORIDE CRYS ER 20 MEQ PO TBCR Oral Take 20 mEq by mouth daily.    Marland Kitchen ROSUVASTATIN CALCIUM 10 MG PO TABS Oral Take 5 mg by mouth daily.       BP 135/76  Pulse 59  Temp 97.6 F (36.4 C) (Oral)  Resp 20  SpO2 96%  Physical Exam  Vitals reviewed. Constitutional: He is oriented to person, place, and time. He appears well-developed and well-nourished.  HENT:  Head: Normocephalic and atraumatic.  Eyes: Pupils are equal, round, and reactive to  light. Right eye exhibits no discharge. Left eye exhibits no discharge. No scleral icterus.  Neck: Normal range of motion. Neck supple.  Cardiovascular: Normal rate.   Pulmonary/Chest: Effort normal and breath sounds normal. No respiratory distress. He has no wheezes. He has no rales.  Abdominal: Soft. He exhibits no distension. There is no tenderness. There is no rebound and no guarding.  Musculoskeletal: He exhibits no tenderness.  Neurological: He is alert and oriented to person, place, and time.  Skin: Skin is warm and dry. No rash noted. No pallor.  Psychiatric: He has a normal mood and affect.    ED Course  Procedures (including critical care time)  Labs Reviewed  BASIC METABOLIC PANEL - Abnormal; Notable for the following:    GFR calc non Af Amer 85 (*)     All other components within normal limits   No results found.   1. Bradycardia by electrocardiogram     ECG at time 11:29 shows marked sinus brady with sinus arrythmia, LAD, non specific IVCD, no ST or T wave abn's.  Compared to ECG from 04/28/06, rate is slower and QRS has lengthened from 108 to 122 msec.     1:36 PM Spoke to [facility] cardiology.  They will get pt an appt to be rechecked by Dr. [name] next week.  Pt and family are ok with plan.  MDM  Pt has gauze to elft arm where he had blood drawn at PCP office.  ECG there shows pt's HR was slow in 30s.  Pt doesn't recall feeling light headed or dizzy.  Initially here, pt had HR in 60's.  However on ECG, shows sig bradycardia.  Will discuss with cardiologist.          Gavin Pound. Oletta Lamas, MD 11/08/11 1336  Gavin Pound. Oletta Lamas, MD 11/08/11 1336  Gavin Pound. Lucielle Vokes, MD 11/08/11 1356

## 2011-11-08 NOTE — ED Notes (Addendum)
Pt reports he saw his NP this morning, the NP wanted him to go straight to the ED to follow up d/t a change seen in his EKG. Pt denies pain

## 2011-11-08 NOTE — ED Notes (Signed)
ecg showed changes at pcp. pcp was concerned. Normal appt. Pt. Does feel weak.

## 2011-11-08 NOTE — Discharge Instructions (Signed)
Bradycardia Bradycardia is a term for a heart rate (pulse) that, in adults, is slower than 60 beats per minute. A normal rate is 60 to 100 beats per minute. A heart rate below 60 beats per minute may be normal for some adults with healthy hearts. If the rate is too slow, the heart may have trouble pumping the volume of blood the body needs. If the heart rate gets too low, blood flow to the brain may be decreased and may make you feel lightheaded, dizzy, or faint. The heart has a natural pacemaker in the top of the heart called the SA node (sinoatrial or sinus node). This pacemaker sends out regular electrical signals to the muscle of the heart, telling the heart muscle when to beat (contract). The electrical signal travels from the upper parts of the heart (atria) through the AV node (atrioventricular node), to the lower chambers of the heart (ventricles). The ventricles squeeze, pumping the blood from your heart to your lungs and to the rest of your body. CAUSES   Problem with the heart's electrical system.   Problem with the heart's natural pacemaker.   Heart disease, damage, or infection.   Medications.   Problems with minerals and salts (electrolytes).  SYMPTOMS   Fainting (syncope).   Fatigue and weakness.   Shortness of breath (dyspnea).   Chest pain (angina).   Drowsiness.   Confusion.  DIAGNOSIS   An electrocardiogram (ECG) can help your caregiver determine the type of slow heart rate you have.   If the cause is not seen on an ECG, you may need to wear a heart monitor that records your heart rhythm for several hours or days.   Blood tests.  TREATMENT   Electrolyte supplements.   Medications.   Withholding medication which is causing a slow heart rate.   Pacemaker placement.  SEEK IMMEDIATE MEDICAL CARE IF:   You feel lightheaded or faint.   You develop an irregular heart rate.   You feel chest pain or have trouble breathing.  MAKE SURE YOU:   Understand  these instructions.   Will watch your condition.   Will get help right away if you are not doing well or get worse.  Document Released: 01/26/2002 Document Revised: 04/25/2011 Document Reviewed: 12/23/2007 [facility] Patient Information 2012 [place], [place].    Dr. Charlott Rakes office will contact you on Monday with an appointment time for next week.

## 2012-01-06 ENCOUNTER — Other Ambulatory Visit: Payer: Self-pay | Admitting: Internal Medicine

## 2012-01-06 NOTE — Telephone Encounter (Signed)
Refilled amlodipine 

## 2012-01-13 ENCOUNTER — Encounter: Payer: Self-pay | Admitting: Internal Medicine

## 2012-01-13 ENCOUNTER — Ambulatory Visit (INDEPENDENT_AMBULATORY_CARE_PROVIDER_SITE_OTHER): Payer: Medicare Other | Admitting: Internal Medicine

## 2012-01-13 VITALS — BP 125/71 | HR 53 | Ht 68.0 in | Wt 204.0 lb

## 2012-01-13 DIAGNOSIS — I10 Essential (primary) hypertension: Secondary | ICD-10-CM

## 2012-01-13 DIAGNOSIS — R0602 Shortness of breath: Secondary | ICD-10-CM

## 2012-01-13 DIAGNOSIS — R001 Bradycardia, unspecified: Secondary | ICD-10-CM

## 2012-01-13 DIAGNOSIS — I498 Other specified cardiac arrhythmias: Secondary | ICD-10-CM

## 2012-01-13 DIAGNOSIS — E782 Mixed hyperlipidemia: Secondary | ICD-10-CM | POA: Diagnosis not present

## 2012-01-13 LAB — LIPID PANEL
LDL Cholesterol: 75 mg/dL (ref 0–99)
Total CHOL/HDL Ratio: 3
Triglycerides: 45 mg/dL (ref 0.0–149.0)

## 2012-01-13 LAB — CBC WITH DIFFERENTIAL/PLATELET
Basophils Relative: 0.3 % (ref 0.0–3.0)
Eosinophils Absolute: 0.3 10*3/uL (ref 0.0–0.7)
Eosinophils Relative: 4.6 % (ref 0.0–5.0)
HCT: 41.3 % (ref 39.0–52.0)
Lymphs Abs: 1.8 10*3/uL (ref 0.7–4.0)
MCHC: 32.8 g/dL (ref 30.0–36.0)
MCV: 96.5 fl (ref 78.0–100.0)
Monocytes Absolute: 0.6 10*3/uL (ref 0.1–1.0)
Neutro Abs: 3.7 10*3/uL (ref 1.4–7.7)
Neutrophils Relative %: 57.4 % (ref 43.0–77.0)
RBC: 4.28 Mil/uL (ref 4.22–5.81)
WBC: 6.4 10*3/uL (ref 4.5–10.5)

## 2012-01-13 LAB — BASIC METABOLIC PANEL
CO2: 28 mEq/L (ref 19–32)
Calcium: 8.9 mg/dL (ref 8.4–10.5)
Chloride: 106 mEq/L (ref 96–112)
Glucose, Bld: 96 mg/dL (ref 70–99)
Sodium: 140 mEq/L (ref 135–145)

## 2012-01-13 LAB — AST: AST: 20 U/L (ref 0–37)

## 2012-01-13 LAB — TSH: TSH: 0.57 u[IU]/mL (ref 0.35–5.50)

## 2012-01-13 NOTE — Progress Notes (Signed)
HPI Patinet is an 78 year old with a hsitory of CAD, dyslipidemai, PAD, DJD, COPD, HTN. GERD. S/p AAA repari. Last cath in March 2005: LVEF 60%. L RA occluded. R RA ok. 60% prox/mid LD; 75 ot80% D1, 75% small D2, OM2 stent OK 50% prior; PDA 70%. Dobbutamine myoview 5/06: NO ischemia or scar. Echo 1/07: Normal LVEF Mild aortic root dlitation He was seen in the ER in June because when his vitals were checked his HR was in the 40s.  In the ER  HR at lowest appeared to be in the 30s.  He was sent out for f/u Since seen he has done OK  No CP.  Breathing is OK  Occasional dizziness with standing.  No presyncope.  Allergies  Allergen Reactions  . Aspirin Swelling  . Atorvastatin Other (See Comments)    Leg pain, feet pain  . Cephalexin Other (See Comments)    unknown    Current Outpatient Prescriptions  Medication Sig Dispense Refill  . amLODipine (NORVASC) 5 MG tablet TAKE 1 TABLET (5 MG TOTAL) BY MOUTH DAILY.  30 tablet  6  . clopidogrel (PLAVIX) 75 MG tablet Take 1 tablet (75 mg total) by mouth daily.  30 tablet  6  . fish oil-omega-3 fatty acids 1000 MG capsule Take 1 g by mouth daily.        . furosemide (LASIX) 20 MG tablet Take 20 mg by mouth daily.       . isosorbide mononitrate (IMDUR) 30 MG 24 hr tablet Take 30 mg by mouth daily.        . metoprolol succinate (TOPROL-XL) 25 MG 24 hr tablet Take 25 mg by mouth daily.      . nitroGLYCERIN (NITROSTAT) 0.4 MG SL tablet Place 1 tablet (0.4 mg total) under the tongue every 5 (five) minutes as needed for chest pain.  25 tablet  12  . potassium chloride SA (K-DUR,KLOR-CON) 20 MEQ tablet Take 20 mEq by mouth daily.      . rosuvastatin (CRESTOR) 10 MG tablet Take 5 mg by mouth daily.         Past Medical History  Diagnosis Date  . HTN (hypertension)     Last echo 1/07:  Normal LVF, LV thickness upper limits of normal, mild aortic root dilatation, mild to mod MAC, mild LAE, borderline RVH, mild RAE.  . Tobacco abuse   . Ethanolism   . COPD  (chronic obstructive pulmonary disease)   . Unilateral congenital absence of kidney   . CAD (coronary artery disease)     s/p stent to OM2 in past;  Last LHC 3/05: EF 60%, left RA occluded, right RA ok, prox to mid LAD 60%, oD1 75-80%, oD2 small 75%, pOM1 50%, OM2 stent ok with 50% before stent, mRCA 30%, dRCA 30%, PDA 70%.  Last dobutamine myoview 5/06: no scar or ischemia, EF 60%.  Marland Kitchen HLD (hyperlipidemia)   . PAD (peripheral artery disease)   . S/P AAA repair   . Ascending aortic aneurysm     Dr. Dorris Fetch  . DJD (degenerative joint disease)   . GERD (gastroesophageal reflux disease)     Past Surgical History  Procedure Date  . Appendectomy   . Hernia repair   . Resection and grafting of infarenal  abdominal  aortic aneurysm 05/03/2002    No family history on file.  History   Social History  . Marital Status: Married    Spouse Name: N/A    Number of Children: N/A  .  Years of Education: N/A   Occupational History  . Not on file.   Social History Main Topics  . Smoking status: Former Smoker    Quit date: 05/06/1996  . Smokeless tobacco: Never Used  . Alcohol Use: Yes  . Drug Use: No  . Sexually Active: Not on file   Other Topics Concern  . Not on file   Social History Narrative  . No narrative on file    Review of Systems:  All systems reviewed.  They are negative to the above problem except as previously stated.  Vital Signs: BP 125/71  Pulse 53  Ht 5\' 8"  (1.727 m)  Wt 204 lb (92.534 kg)  BMI 31.02 kg/m2  Physical Exam Patient is in NAD HEENT:  Normocephalic, atraumatic. EOMI, PERRLA.  Neck: JVP is normal.  No bruits.  Lungs: clear to auscultation. No rales no wheezes.  Heart: Regular rate and rhythm. Normal S1, S2. No S3.   No significant murmurs. PMI not displaced.  Abdomen:  Supple, nontender. Normal bowel sounds. No masses. No hepatomegaly.  Extremities:   Good distal pulses throughout. No lower extremity edema.  Musculoskeletal :moving all  extremities.  Neuro:   alert and oriented x3.  CN II-XII grossly intact.   Assessment and Plan:  1.  Bradycardia.  Patient wore a holter monitor in Dec 2012.  No signif bradycardia.  Longest pause 2.6 sec. I am not convinced he is symptomatic.  For now I would stop toprol 25  Get holter montior.  Will check TSH  2.  CAD.  No symptoms to sugg angina  3.  HL  Will check lipids.  Will check BMET, TSH, Lipid, AST, CBC.  F/u in 6 months or sooner if symptoms.

## 2012-01-13 NOTE — Patient Instructions (Addendum)
STOP Toprol  Lab work today We will call you with results.  Schedule Holter Monitor in a few weeks.  Your physician wants you to follow-up in: 6 months You will receive a reminder letter in the mail two months in advance. If you don't receive a letter, please call our office to schedule the follow-up appointment.

## 2012-01-14 ENCOUNTER — Other Ambulatory Visit: Payer: Self-pay | Admitting: Internal Medicine

## 2012-02-03 ENCOUNTER — Other Ambulatory Visit: Payer: Self-pay | Admitting: Internal Medicine

## 2012-02-10 ENCOUNTER — Encounter (INDEPENDENT_AMBULATORY_CARE_PROVIDER_SITE_OTHER): Payer: Medicare Other

## 2012-02-10 DIAGNOSIS — I498 Other specified cardiac arrhythmias: Secondary | ICD-10-CM | POA: Diagnosis not present

## 2012-02-21 ENCOUNTER — Telehealth: Payer: Self-pay | Admitting: *Deleted

## 2012-02-21 NOTE — Telephone Encounter (Signed)
Patient is aware of Holter monitor results and recommendations . Patient states he is feeling fine at this time.

## 2012-03-10 ENCOUNTER — Other Ambulatory Visit: Payer: Self-pay | Admitting: Thoracic Surgery (Cardiothoracic Vascular Surgery)

## 2012-03-10 DIAGNOSIS — I712 Thoracic aortic aneurysm, without rupture: Secondary | ICD-10-CM

## 2012-03-17 DIAGNOSIS — Z23 Encounter for immunization: Secondary | ICD-10-CM | POA: Diagnosis not present

## 2012-03-30 LAB — CREATININE, SERUM: Creat: 0.77 mg/dL (ref 0.50–1.35)

## 2012-03-30 LAB — BUN: BUN: 14 mg/dL (ref 6–23)

## 2012-03-31 ENCOUNTER — Ambulatory Visit (INDEPENDENT_AMBULATORY_CARE_PROVIDER_SITE_OTHER): Payer: Medicare Other | Admitting: Thoracic Surgery (Cardiothoracic Vascular Surgery)

## 2012-03-31 ENCOUNTER — Encounter: Payer: Self-pay | Admitting: Thoracic Surgery (Cardiothoracic Vascular Surgery)

## 2012-03-31 ENCOUNTER — Ambulatory Visit
Admission: RE | Admit: 2012-03-31 | Discharge: 2012-03-31 | Disposition: A | Discharge status: Home / Self Care | Payer: Medicare Other | Source: Ambulatory Visit | Attending: Thoracic Surgery (Cardiothoracic Vascular Surgery) | Admitting: Thoracic Surgery (Cardiothoracic Vascular Surgery)

## 2012-03-31 VITALS — BP 122/76 | HR 73 | Resp 18 | Ht 68.0 in | Wt 200.0 lb

## 2012-03-31 DIAGNOSIS — I712 Thoracic aortic aneurysm, without rupture: Secondary | ICD-10-CM | POA: Diagnosis not present

## 2012-03-31 DIAGNOSIS — I2510 Atherosclerotic heart disease of native coronary artery without angina pectoris: Secondary | ICD-10-CM | POA: Diagnosis not present

## 2012-03-31 MED ORDER — IOHEXOL 350 MG/ML SOLN
80.0000 mL | Freq: Once | INTRAVENOUS | Status: AC | PRN
  Administered 2012-03-31: 80 mL via INTRAVENOUS

## 2012-03-31 NOTE — Progress Notes (Signed)
HPI:  Brad [name] is a 78 year old gentleman who has been followed since 2003 for an ascending aortic aneurysm. It has grown minimally over that time. He was last in the office year ago which time it was measured by the radiologist of 4.4 x 4.6 cm.  In the interim since his last visit a year ago, he has not had any new cardiac issues. He does complain of some pain and paresthesias in his right arm and hand. He was told to try sleeping on his left side, which he did the last couple of nights and did note some improvement. He also has been having some trouble with back pain.  Past Medical History  Diagnosis Date  . HTN (hypertension)     Last echo 1/07:  Normal LVF, LV thickness upper limits of normal, mild aortic root dilatation, mild to mod MAC, mild LAE, borderline RVH, mild RAE.  . Tobacco abuse   . Ethanolism   . COPD (chronic obstructive pulmonary disease)   . Unilateral congenital absence of kidney   . CAD (coronary artery disease)     s/p stent to OM2 in past;  Last LHC 3/05: EF 60%, left RA occluded, right RA ok, prox to mid LAD 60%, oD1 75-80%, oD2 small 75%, pOM1 50%, OM2 stent ok with 50% before stent, mRCA 30%, dRCA 30%, PDA 70%.  Last dobutamine myoview 5/06: no scar or ischemia, EF 60%.  Marland Kitchen HLD (hyperlipidemia)   . PAD (peripheral artery disease)   . S/P AAA repair   . Ascending aortic aneurysm     Dr. Dorris Fetch  . DJD (degenerative joint disease)   . GERD (gastroesophageal reflux disease)      Current Outpatient Prescriptions  Medication Sig Dispense Refill  . amLODipine (NORVASC) 5 MG tablet TAKE 1 TABLET (5 MG TOTAL) BY MOUTH DAILY.  30 tablet  6  . clopidogrel (PLAVIX) 75 MG tablet TAKE 1 TABLET BY MOUTH DAILY  30 tablet  6  . fish oil-omega-3 fatty acids 1000 MG capsule Take 1 g by mouth daily.        . furosemide (LASIX) 20 MG tablet Take 20 mg by mouth daily.       . isosorbide mononitrate (IMDUR) 30 MG 24 hr tablet Take 30 mg by mouth daily.        Marland Kitchen KLOR-CON  M20 20 MEQ tablet TAKE 1 TABLET EVERY DAY  30 each  2  . nitroGLYCERIN (NITROSTAT) 0.4 MG SL tablet Place 1 tablet (0.4 mg total) under the tongue every 5 (five) minutes as needed for chest pain.  25 tablet  12  . rosuvastatin (CRESTOR) 10 MG tablet Take 5 mg by mouth daily.        No current facility-administered medications for this visit.   Facility-Administered Medications Ordered in Other Visits  Medication Dose Route Frequency Provider Last Rate Last Dose  . [COMPLETED] iohexol (OMNIPAQUE) 350 MG/ML injection 80 mL  80 mL Intravenous Once PRN Medication Radiologist, MD   80 mL at 03/31/12 1311    Physical Exam BP 122/76  Pulse 73  Resp 18  Ht 5\' 8"  (1.727 m)  Wt 200 lb (90.719 kg)  BMI 30.41 kg/m2  SpO43 49% 78 year old male in no acute distress General well-developed well-nourished, overweight Lungs diminished breath sounds bilaterally, no rales or wheezes Cardiac regular rate and rhythm, 2/6 systolic murmur No carotid bruits  Diagnostic Tests: CT of chest 03/31/2012 *RADIOLOGY REPORT*  Clinical Data: Ascending aortic aneurysm  CT ANGIOGRAPHY CHEST  Technique: Multidetector CT imaging of the chest using the  standard protocol during bolus administration of intravenous  contrast. Multiplanar reconstructed images including MIPs were  obtained and reviewed to evaluate the vascular anatomy.  Contrast: 80mL OMNIPAQUE IOHEXOL 350 MG/ML SOLN the  Comparison: CT 04/04/2011  Findings: The ascending thoracic aorta measures 46 x 46 mm at the  level of the main pulmonary artery compared to 46 x 44 mm on prior.  The great vessels are normal. The descending thoracic aorta is  normal caliber at 29 mm.  Coronary calcification is noted. No pericardial fluid. Pulmonary  arteries are normal caliber.  There are small mediastinal lymph nodes unchanged from prior. No  axillary or supraclavicular lymphadenopathy.  Review of the lung windows demonstrate central lobular emphysema in  the  upper lobes. Right middle lobe elongated 8 mm nodule (image  32) is not changed from prior. No new pulmonary nodules.  Limited view of the upper abdomen is unremarkable.  Limited view of the skeleton demonstrates scoliosis and  degenerative spurring.  IMPRESSION:  1. Stable ascending aortic aneurysm.  2. Coronary artery calcifications.  Impression: 78 year old with a stable 4.6 cm ascending aortic aneurysm. There is no indication for surgery at this time. We'll continue to follow with CT scans annually.  Plan: Return in one year with CT of chest

## 2012-05-05 ENCOUNTER — Other Ambulatory Visit: Payer: Self-pay | Admitting: *Deleted

## 2012-05-05 MED ORDER — POTASSIUM CHLORIDE CRYS ER 20 MEQ PO TBCR: 20.0000 meq | EXTENDED_RELEASE_TABLET | Freq: Every day | ORAL | Status: DC

## 2012-05-08 DIAGNOSIS — I10 Essential (primary) hypertension: Secondary | ICD-10-CM | POA: Diagnosis not present

## 2012-05-08 DIAGNOSIS — I2510 Atherosclerotic heart disease of native coronary artery without angina pectoris: Secondary | ICD-10-CM | POA: Diagnosis not present

## 2012-05-30 ENCOUNTER — Other Ambulatory Visit: Payer: Self-pay | Admitting: Internal Medicine

## 2012-06-29 ENCOUNTER — Ambulatory Visit: Payer: Medicare Other | Admitting: Internal Medicine

## 2012-07-13 ENCOUNTER — Encounter: Payer: Self-pay | Admitting: Internal Medicine

## 2012-07-13 ENCOUNTER — Ambulatory Visit (INDEPENDENT_AMBULATORY_CARE_PROVIDER_SITE_OTHER): Payer: Medicare Other | Admitting: Internal Medicine

## 2012-07-13 VITALS — BP 137/77 | HR 57 | Ht 64.0 in | Wt 200.0 lb

## 2012-07-13 DIAGNOSIS — E785 Hyperlipidemia, unspecified: Secondary | ICD-10-CM

## 2012-07-13 DIAGNOSIS — I25810 Atherosclerosis of coronary artery bypass graft(s) without angina pectoris: Secondary | ICD-10-CM

## 2012-07-13 DIAGNOSIS — I10 Essential (primary) hypertension: Secondary | ICD-10-CM

## 2012-07-13 NOTE — Progress Notes (Signed)
HPI Patinet is an 79 year old with a hsitory of CAD, dyslipidemai, PAD, DJD, COPD, HTN. GERD. S/p AAA repari. Last cath in March 2005: LVEF 60%. L RA occluded. R RA ok. 60% prox/mid LD; 75 ot80% D1, 75% small D2, OM2 stent OK 50% prior; PDA 70%. Dobbutamine myoview 5/06: NO ischemia or scar. Echo 1/07: Normal LVEF Mild aortic root dlitation.  Patinet was seen by Sunday Corn in November 2013  I saw him in April 2013 Allergies  Allergen Reactions  . Aspirin Swelling  . Atorvastatin Other (See Comments)    Leg pain, feet pain  . Cephalexin Other (See Comments)    unknown    Current Outpatient Prescriptions  Medication Sig Dispense Refill  . amLODipine (NORVASC) 5 MG tablet TAKE 1 TABLET (5 MG TOTAL) BY MOUTH DAILY.  30 tablet  6  . clopidogrel (PLAVIX) 75 MG tablet TAKE 1 TABLET BY MOUTH DAILY  30 tablet  6  . fish oil-omega-3 fatty acids 1000 MG capsule Take 1 g by mouth daily.        . furosemide (LASIX) 20 MG tablet Take 1 tablet (20 mg total) by mouth daily.  30 tablet  2  . isosorbide mononitrate (IMDUR) 30 MG 24 hr tablet Take 30 mg by mouth daily.        . nitroGLYCERIN (NITROSTAT) 0.4 MG SL tablet Place 1 tablet (0.4 mg total) under the tongue every 5 (five) minutes as needed for chest pain.  25 tablet  12  . potassium chloride SA (KLOR-CON M20) 20 MEQ tablet Take 1 tablet (20 mEq total) by mouth daily.  30 tablet  5  . rosuvastatin (CRESTOR) 10 MG tablet Take 5 mg by mouth daily.        No current facility-administered medications for this visit.    Past Medical History  Diagnosis Date  . HTN (hypertension)     Last echo 1/07:  Normal LVF, LV thickness upper limits of normal, mild aortic root dilatation, mild to mod MAC, mild LAE, borderline RVH, mild RAE.  . Tobacco abuse   . Ethanolism   . COPD (chronic obstructive pulmonary disease)   . Unilateral congenital absence of kidney   . CAD (coronary artery disease)     s/p stent to OM2 in past;  Last LHC 3/05: EF 60%, left RA  occluded, right RA ok, prox to mid LAD 60%, oD1 75-80%, oD2 small 75%, pOM1 50%, OM2 stent ok with 50% before stent, mRCA 30%, dRCA 30%, PDA 70%.  Last dobutamine myoview 5/06: no scar or ischemia, EF 60%.  Marland Kitchen HLD (hyperlipidemia)   . PAD (peripheral artery disease)   . S/P AAA repair   . Ascending aortic aneurysm     Dr. Dorris Fetch  . DJD (degenerative joint disease)   . GERD (gastroesophageal reflux disease)     Past Surgical History  Procedure Laterality Date  . Appendectomy    . Hernia repair    . Resection and grafting of infarenal  abdominal  aortic aneurysm  05/03/2002    No family history on file.  History   Social History  . Marital Status: Married    Spouse Name: N/A    Number of Children: N/A  . Years of Education: N/A   Occupational History  . Not on file.   Social History Main Topics  . Smoking status: Former Smoker    Quit date: 05/06/1996  . Smokeless tobacco: Never Used  . Alcohol Use: Yes  . Drug Use:  No  . Sexually Active: Not on file   Other Topics Concern  . Not on file   Social History Narrative  . No narrative on file    Review of Systems:  All systems reviewed.  They are negative to the above problem except as previously stated.  Vital Signs: BP 137/77  Pulse 57  Ht 5\' 4"  (1.626 m)  Wt 200 lb (90.719 kg)  BMI 34.31 kg/m2  Physical Exam Patient is in NAD HEENT:  Normocephalic, atraumatic. EOMI, PERRLA.  Neck: JVP is normal.  No bruits.  Lungs: clear to auscultation. No rales no wheezes.  Heart: Regular rate and rhythm. Normal S1, S2. No S3.   No significant murmurs. PMI not displaced.  Abdomen:  Supple, nontender. Normal bowel sounds. No masses. No hepatomegaly.  Extremities:   Good distal pulses throughout. tr lower extremity edema.  Musculoskeletal :moving all extremities.  Neuro:   alert and oriented x3.  CN II-XII grossly intact.  EKG  SB  53 bpm  LAFB.   Assessment and Plan:  1.  CAD  No symptoms of angina  2.  HTN  Keep  on same regimen  3.  HL  Lipids excellent in August

## 2012-08-08 ENCOUNTER — Other Ambulatory Visit: Payer: Self-pay | Admitting: Internal Medicine

## 2012-08-15 ENCOUNTER — Other Ambulatory Visit: Payer: Self-pay | Admitting: Internal Medicine

## 2012-08-29 ENCOUNTER — Other Ambulatory Visit: Payer: Self-pay | Admitting: Cardiovascular Disease

## 2012-09-08 ENCOUNTER — Telehealth: Payer: Self-pay | Admitting: Family Medicine

## 2012-10-02 NOTE — Telephone Encounter (Signed)
error 

## 2012-10-08 ENCOUNTER — Other Ambulatory Visit: Payer: Self-pay | Admitting: *Deleted

## 2012-10-08 MED ORDER — ROSUVASTATIN CALCIUM 10 MG PO TABS: 5.0000 mg | ORAL_TABLET | Freq: Every day | ORAL | Status: DC

## 2012-10-08 NOTE — Telephone Encounter (Signed)
[facility] 6/13

## 2012-10-09 ENCOUNTER — Telehealth: Payer: Self-pay | Admitting: Family Medicine

## 2012-10-09 NOTE — Telephone Encounter (Signed)
crestor 10 called to [facility] [address]- much cheaper for pt 10mg  od as directed #30- 2 rf

## 2012-11-10 DIAGNOSIS — H43819 Vitreous degeneration, unspecified eye: Secondary | ICD-10-CM | POA: Diagnosis not present

## 2012-11-10 DIAGNOSIS — H353190 Nonexudative age-related macular degeneration, unspecified eye, stage unspecified: Secondary | ICD-10-CM | POA: Diagnosis not present

## 2012-11-10 DIAGNOSIS — H35429 Microcystoid degeneration of retina, unspecified eye: Secondary | ICD-10-CM | POA: Diagnosis not present

## 2012-11-14 ENCOUNTER — Other Ambulatory Visit: Payer: Self-pay | Admitting: Family Medicine

## 2012-11-16 ENCOUNTER — Other Ambulatory Visit: Payer: Self-pay | Admitting: Internal Medicine

## 2012-11-16 ENCOUNTER — Encounter: Payer: Self-pay | Admitting: *Deleted

## 2012-11-16 NOTE — Telephone Encounter (Signed)
This is okay to refill, the patient needs to make an appointment to be seen by a provider here

## 2012-11-16 NOTE — Telephone Encounter (Signed)
This encounter was created in error - please disregard.

## 2012-11-16 NOTE — Telephone Encounter (Signed)
Last RF 12/13

## 2013-02-20 ENCOUNTER — Other Ambulatory Visit: Payer: Self-pay | Admitting: Family Medicine

## 2013-02-23 NOTE — Telephone Encounter (Signed)
Last seen 05/08/12  DFS

## 2013-03-04 ENCOUNTER — Ambulatory Visit (INDEPENDENT_AMBULATORY_CARE_PROVIDER_SITE_OTHER): Payer: Medicare Other

## 2013-03-04 ENCOUNTER — Encounter (INDEPENDENT_AMBULATORY_CARE_PROVIDER_SITE_OTHER): Payer: Self-pay

## 2013-03-04 DIAGNOSIS — Z23 Encounter for immunization: Secondary | ICD-10-CM | POA: Diagnosis not present

## 2013-03-06 ENCOUNTER — Other Ambulatory Visit: Payer: Self-pay | Admitting: Internal Medicine

## 2013-03-13 ENCOUNTER — Other Ambulatory Visit: Payer: Self-pay | Admitting: Internal Medicine

## 2013-03-18 ENCOUNTER — Other Ambulatory Visit: Payer: Self-pay

## 2013-03-18 DIAGNOSIS — I712 Thoracic aortic aneurysm, without rupture: Secondary | ICD-10-CM

## 2013-03-20 ENCOUNTER — Other Ambulatory Visit: Payer: Self-pay | Admitting: Internal Medicine

## 2013-03-20 ENCOUNTER — Other Ambulatory Visit: Payer: Self-pay | Admitting: Nurse Practitioner

## 2013-03-23 NOTE — Telephone Encounter (Signed)
Last seen 05/08/12  DFS   

## 2013-04-01 ENCOUNTER — Other Ambulatory Visit: Payer: Self-pay | Admitting: Thoracic Surgery (Cardiothoracic Vascular Surgery)

## 2013-04-01 DIAGNOSIS — I712 Thoracic aortic aneurysm, without rupture: Secondary | ICD-10-CM | POA: Diagnosis not present

## 2013-04-01 LAB — BUN: BUN: 21 mg/dL (ref 6–23)

## 2013-04-06 ENCOUNTER — Encounter: Payer: Self-pay | Admitting: Thoracic Surgery (Cardiothoracic Vascular Surgery)

## 2013-04-06 ENCOUNTER — Ambulatory Visit (INDEPENDENT_AMBULATORY_CARE_PROVIDER_SITE_OTHER): Payer: Medicare Other | Admitting: Thoracic Surgery (Cardiothoracic Vascular Surgery)

## 2013-04-06 ENCOUNTER — Ambulatory Visit
Admission: RE | Admit: 2013-04-06 | Discharge: 2013-04-06 | Disposition: A | Discharge status: Home / Self Care | Payer: Medicare Other | Source: Ambulatory Visit | Attending: Thoracic Surgery (Cardiothoracic Vascular Surgery) | Admitting: Thoracic Surgery (Cardiothoracic Vascular Surgery)

## 2013-04-06 VITALS — BP 145/83 | HR 53 | Resp 20 | Ht 68.0 in | Wt 197.0 lb

## 2013-04-06 DIAGNOSIS — I712 Thoracic aortic aneurysm, without rupture: Secondary | ICD-10-CM

## 2013-04-06 DIAGNOSIS — I77819 Aortic ectasia, unspecified site: Secondary | ICD-10-CM | POA: Diagnosis not present

## 2013-04-06 MED ORDER — IOHEXOL 350 MG/ML SOLN
75.0000 mL | Freq: Once | INTRAVENOUS | Status: AC | PRN
  Administered 2013-04-06: 75 mL via INTRAVENOUS

## 2013-04-06 NOTE — Progress Notes (Signed)
HPI:  Mr. Pannone is a 79 year old gentleman who we have been following since 2003 for an ascending aortic aneurysm. It has grown minimally over that time. He was last seen in the office a year ago at which time the aneurysm was unchanged at approximately 4.6 cm.   In the interim since his last visit a year ago, he has not had any new cardiac issues. He continues to complain of some pain and paresthesias in his right arm and hand. He was evaluated for this and told it might be a cervical spine issue, but his symptoms improved and he didn't pursue it any further. He says he is now having the same symptoms again. He has been dealing with issues related to his wife's health. She is in the end stages of Alzheimer's and it has been very stressful for him.  He denies any chest pain but says he has very little energy and gets "worn out" walking to his mailbox.   Past Medical History  Diagnosis Date  . HTN (hypertension)     Last echo 1/07:  Normal LVF, LV thickness upper limits of normal, mild aortic root dilatation, mild to mod MAC, mild LAE, borderline RVH, mild RAE.  . Tobacco abuse   . Ethanolism   . COPD (chronic obstructive pulmonary disease)   . Unilateral congenital absence of kidney   . CAD (coronary artery disease)     s/p stent to OM2 in past;  Last LHC 3/05: EF 60%, left RA occluded, right RA ok, prox to mid LAD 60%, oD1 75-80%, oD2 small 75%, pOM1 50%, OM2 stent ok with 50% before stent, mRCA 30%, dRCA 30%, PDA 70%.  Last dobutamine myoview 5/06: no scar or ischemia, EF 60%.  Marland Kitchen HLD (hyperlipidemia)   . PAD (peripheral artery disease)   . S/P AAA repair   . Ascending aortic aneurysm     Dr. Dorris Fetch  . DJD (degenerative joint disease)   . GERD (gastroesophageal reflux disease)        Current Outpatient Prescriptions  Medication Sig Dispense Refill  . amLODipine (NORVASC) 5 MG tablet TAKE 1 TABLET (5 MG TOTAL) BY MOUTH DAILY.  30 tablet  3  . clopidogrel (PLAVIX) 75 MG  tablet TAKE 1 TABLET BY MOUTH DAILY  30 tablet  6  . fish oil-omega-3 fatty acids 1000 MG capsule Take 1 g by mouth daily.        . furosemide (LASIX) 20 MG tablet TAKE 1 TABLET (20 MG TOTAL) BY MOUTH DAILY.  30 tablet  5  . isosorbide mononitrate (IMDUR) 30 MG 24 hr tablet TAKE 1 TABLET BY MOUTH ONCE A DAY  30 tablet  0  . potassium chloride SA (K-DUR,KLOR-CON) 20 MEQ tablet TAKE 1 TABLET (20 MEQ TOTAL) BY MOUTH DAILY.  30 tablet  5  . rosuvastatin (CRESTOR) 10 MG tablet Take 0.5 tablets (5 mg total) by mouth daily.  30 tablet  0   No current facility-administered medications for this visit.    Physical Exam BP 145/83  Pulse 53  Resp 20  Ht 5\' 8"  (1.727 m)  Wt 197 lb (89.359 kg)  BMI 29.96 kg/m2  SpO97 11% 79 year old male in no acute distress Neurologic alert and oriented x3 with no focal motor deficits Cardiac regular rate and rhythm 2/6 systolic murmur Lungs clear, equal breath sounds bilaterally  Diagnostic Tests: CT of chest 04/06/2013 Stable thoracic aorta with mild ectasia. No evidence of progressive aneurysm or dissection. 2. COPD. 2 small stable subcentimeter  pulmonary nodules right lung.  Impression: 79 year old gentleman with aortic atherosclerotic disease and a known ascending aortic aneurysm that we have been following for 11 years now. It has shown only minimal signs of growth over that time frame. There is been no interval growth in the past year.  He really is not doing as well currently as she was a year ago. I think this in large part is likely due to his wife's decline and the emotional toll from that.  He had questions about his Crestor. He's been on that for a long period of time. He does not have any symptoms that he thinks are directly related to that. I recommended he continue that medication for now, but he can ask Dr. [name] about that when he follows up with her.   Plan:  Return in one year with CT angiogram of chest

## 2013-04-23 ENCOUNTER — Encounter: Payer: Self-pay | Admitting: Family Medicine

## 2013-04-23 ENCOUNTER — Ambulatory Visit (INDEPENDENT_AMBULATORY_CARE_PROVIDER_SITE_OTHER): Payer: Medicare Other | Admitting: Family Medicine

## 2013-04-23 VITALS — BP 106/66 | HR 42 | Temp 97.3°F | Ht 68.0 in | Wt 196.0 lb

## 2013-04-23 DIAGNOSIS — R001 Bradycardia, unspecified: Secondary | ICD-10-CM

## 2013-04-23 DIAGNOSIS — I498 Other specified cardiac arrhythmias: Secondary | ICD-10-CM

## 2013-04-23 DIAGNOSIS — K4090 Unilateral inguinal hernia, without obstruction or gangrene, not specified as recurrent: Secondary | ICD-10-CM

## 2013-04-23 NOTE — Progress Notes (Signed)
   Subjective:    Patient ID: Brad [name], male    DOB: 22-Mar-1928, 79 y.o.   MRN: [id number]  HPI  This 79 y.o. male presents for evaluation of having a knot in his stomach. He has been having some discomfort in his right inguinal region and his left inguinal  Region.    Review of Systems    No chest pain, SOB, HA, dizziness, vision change, N/V, diarrhea, constipation, dysuria, urinary urgency or frequency, myalgias, arthralgias or rash.  Objective:   Physical Exam  Vital signs noted  Elderly male in NAD  HEENT - Head atraumatic Normocephalic                Eyes - PERRLA, Conjuctiva - clear Sclera- Clear EOMI                Ears - EAC's Wnl TM's Wnl Gross Hearing WNL                Throat - oropharanx wnl Respiratory - Lungs CTA bilateral Cardiac - RRR S1 and S2 without murmur GI - Abdomen soft Nontender and bowel sounds active x 4 Extremities - No edema. Neuro - Grossly intact. GU - Uncircumcised male, testes w/o masses, Right inguinal area with Mass and discomfort with valsalva but no mass palpated with exam of inguinal Canal bilateral.  EKG - SB rate 42 w/o ectopy    Filed Vitals:   04/23/13 0847  BP: 106/66  Pulse: 42  Temp: 97.3 F (36.3 C)   Assessment & Plan:  Inguinal hernia - Plan: Ambulatory referral to General Surgery  Bradycardia - Plan: Ambulatory referral to Cardiology  Deatra Canter FNP

## 2013-04-23 NOTE — Patient Instructions (Signed)
Hernia A hernia occurs when an internal organ pushes out through a weak spot in the abdominal wall. Hernias most commonly occur in the groin and around the navel. Hernias often can be pushed back into place (reduced). Most hernias tend to get worse over time. Some abdominal hernias can get stuck in the opening (irreducible or incarcerated hernia) and cannot be reduced. An irreducible abdominal hernia which is tightly squeezed into the opening is at risk for impaired blood supply (strangulated hernia). A strangulated hernia is a medical emergency. Because of the risk for an irreducible or strangulated hernia, surgery may be recommended to repair a hernia. CAUSES   Heavy lifting.  Prolonged coughing.  Straining to have a bowel movement.  A cut (incision) made during an abdominal surgery. HOME CARE INSTRUCTIONS   Bed rest is not required. You may continue your normal activities.  Avoid lifting more than 10 pounds (4.5 kg) or straining.  Cough gently. If you are a smoker it is best to stop. Even the best hernia repair can break down with the continual strain of coughing. Even if you do not have your hernia repaired, a cough will continue to aggravate the problem.  Do not wear anything tight over your hernia. Do not try to keep it in with an outside bandage or truss. These can damage abdominal contents if they are trapped within the hernia sac.  Eat a normal diet.  Avoid constipation. Straining over long periods of time will increase hernia size and encourage breakdown of repairs. If you cannot do this with diet alone, stool softeners may be used. SEEK IMMEDIATE MEDICAL CARE IF:   You have a fever.  You develop increasing abdominal pain.  You feel nauseous or vomit.  Your hernia is stuck outside the abdomen, looks discolored, feels hard, or is tender.  You have any changes in your bowel habits or in the hernia that are unusual for you.  You have increased pain or swelling around the  hernia.  You cannot push the hernia back in place by applying gentle pressure while lying down. MAKE SURE YOU:   Understand these instructions.  Will watch your condition.  Will get help right away if you are not doing well or get worse. Document Released: 05/06/2005 Document Revised: 07/29/2011 Document Reviewed: 12/24/2007 [employer] Patient Information 2014 [employer].  

## 2013-04-24 ENCOUNTER — Other Ambulatory Visit: Payer: Self-pay | Admitting: Family Medicine

## 2013-04-27 ENCOUNTER — Ambulatory Visit (INDEPENDENT_AMBULATORY_CARE_PROVIDER_SITE_OTHER): Payer: Medicare Other | Admitting: General Surgery

## 2013-05-03 ENCOUNTER — Encounter: Payer: Self-pay | Admitting: *Deleted

## 2013-05-03 ENCOUNTER — Encounter: Payer: Self-pay | Admitting: Internal Medicine

## 2013-05-03 ENCOUNTER — Encounter (INDEPENDENT_AMBULATORY_CARE_PROVIDER_SITE_OTHER): Payer: Medicare Other

## 2013-05-03 ENCOUNTER — Ambulatory Visit (INDEPENDENT_AMBULATORY_CARE_PROVIDER_SITE_OTHER): Payer: Medicare Other | Admitting: Internal Medicine

## 2013-05-03 VITALS — BP 123/61 | HR 43 | Ht 67.5 in | Wt 197.0 lb

## 2013-05-03 DIAGNOSIS — I498 Other specified cardiac arrhythmias: Secondary | ICD-10-CM | POA: Diagnosis not present

## 2013-05-03 DIAGNOSIS — R55 Syncope and collapse: Secondary | ICD-10-CM

## 2013-05-03 DIAGNOSIS — I2510 Atherosclerotic heart disease of native coronary artery without angina pectoris: Secondary | ICD-10-CM

## 2013-05-03 DIAGNOSIS — I495 Sick sinus syndrome: Secondary | ICD-10-CM

## 2013-05-03 DIAGNOSIS — E785 Hyperlipidemia, unspecified: Secondary | ICD-10-CM

## 2013-05-03 DIAGNOSIS — R42 Dizziness and giddiness: Secondary | ICD-10-CM

## 2013-05-03 DIAGNOSIS — R0602 Shortness of breath: Secondary | ICD-10-CM

## 2013-05-03 DIAGNOSIS — R001 Bradycardia, unspecified: Secondary | ICD-10-CM

## 2013-05-03 LAB — CBC
HCT: 38.6 % — ABNORMAL LOW (ref 39.0–52.0)
Platelets: 171 10*3/uL (ref 150.0–400.0)
RBC: 4.06 Mil/uL — ABNORMAL LOW (ref 4.22–5.81)
WBC: 6.3 10*3/uL (ref 4.5–10.5)

## 2013-05-03 LAB — LIPID PANEL
Cholesterol: 125 mg/dL (ref 0–200)
HDL: 40.1 mg/dL (ref >39.00)
LDL Cholesterol: 65 mg/dL (ref 0–99)
Total CHOL/HDL Ratio: 3
Triglycerides: 99 mg/dL (ref 0.0–149.0)
VLDL: 19.8 mg/dL (ref 0.0–40.0)

## 2013-05-03 LAB — HEPATIC FUNCTION PANEL
ALT: 21 U/L (ref 0–53)
Albumin: 3.8 g/dL (ref 3.5–5.2)
Alkaline Phosphatase: 36 U/L — ABNORMAL LOW (ref 39–117)
Total Protein: 6.6 g/dL (ref 6.0–8.3)

## 2013-05-03 LAB — BASIC METABOLIC PANEL
BUN: 20 mg/dL (ref 6–23)
Creatinine, Ser: 0.9 mg/dL (ref 0.4–1.5)
GFR: 82.3 mL/min (ref >60.00)
Glucose, Bld: 98 mg/dL (ref 70–99)
Potassium: 4 mEq/L (ref 3.5–5.1)

## 2013-05-03 LAB — TSH: TSH: 0.09 u[IU]/mL — ABNORMAL LOW (ref 0.35–5.50)

## 2013-05-03 NOTE — Progress Notes (Signed)
Patient ID: Brad [name], male   DOB: [birth year], 79 y.o.   MRN: [id number] E-Cardio 48 hour holter monitor applied to patient.

## 2013-05-03 NOTE — Progress Notes (Signed)
HPI Patinet is an 79 year old with a hsitory of CAD, dyslipidemai, PAD, DJD, COPD, HTN. GERD. S/p AAA repari. Last cath in March 2005: LVEF 60%. L RA occluded. R RA ok. 60% prox/mid LD; 75 ot80% D1, 75% small D2, OM2 stent OK 50% prior; PDA 70%. Dobbutamine myoview 5/06: NO ischemia or scar. Echo 1/07: Normal LVEF Mild aortic root dlitation.  The patient presents on referral of primary MD for bradycardia  He has noticed increased fatigue.  Gets more SOB with activty.  NO CP Occasionally dizzy  Not sure when occurs.   Allergies  Allergen Reactions  . Aspirin Swelling  . Atorvastatin Other (See Comments)    Leg pain, feet pain  . Cephalexin Other (See Comments)    unknown    Current Outpatient Prescriptions  Medication Sig Dispense Refill  . amLODipine (NORVASC) 5 MG tablet TAKE 1 TABLET (5 MG TOTAL) BY MOUTH DAILY.  30 tablet  3  . clopidogrel (PLAVIX) 75 MG tablet TAKE 1 TABLET BY MOUTH DAILY  30 tablet  6  . fish oil-omega-3 fatty acids 1000 MG capsule Take 1 g by mouth daily.        . furosemide (LASIX) 20 MG tablet TAKE 1 TABLET (20 MG TOTAL) BY MOUTH DAILY.  30 tablet  5  . isosorbide mononitrate (IMDUR) 30 MG 24 hr tablet TAKE 1 TABLET BY MOUTH ONCE A DAY  30 tablet  5  . potassium chloride SA (K-DUR,KLOR-CON) 20 MEQ tablet TAKE 1 TABLET (20 MEQ TOTAL) BY MOUTH DAILY.  30 tablet  5  . rosuvastatin (CRESTOR) 10 MG tablet Take 0.5 tablets (5 mg total) by mouth daily.  30 tablet  0   No current facility-administered medications for this visit.    Past Medical History  Diagnosis Date  . HTN (hypertension)     Last echo 1/07:  Normal LVF, LV thickness upper limits of normal, mild aortic root dilatation, mild to mod MAC, mild LAE, borderline RVH, mild RAE.  . Tobacco abuse   . Ethanolism   . COPD (chronic obstructive pulmonary disease)   . Unilateral congenital absence of kidney   . CAD (coronary artery disease)     s/p stent to OM2 in past;  Last LHC 3/05: EF 60%, left RA  occluded, right RA ok, prox to mid LAD 60%, oD1 75-80%, oD2 small 75%, pOM1 50%, OM2 stent ok with 50% before stent, mRCA 30%, dRCA 30%, PDA 70%.  Last dobutamine myoview 5/06: no scar or ischemia, EF 60%.  Marland Kitchen HLD (hyperlipidemia)   . PAD (peripheral artery disease)   . S/P AAA repair   . Ascending aortic aneurysm     Dr. Dorris Fetch  . DJD (degenerative joint disease)   . GERD (gastroesophageal reflux disease)     Past Surgical History  Procedure Laterality Date  . Appendectomy    . Hernia repair    . Resection and grafting of infarenal  abdominal  aortic aneurysm  05/03/2002    No family history on file.  History   Social History  . Marital Status: Married    Spouse Name: N/A    Number of Children: N/A  . Years of Education: N/A   Occupational History  . Not on file.   Social History Main Topics  . Smoking status: Former Smoker    Quit date: 05/06/1996  . Smokeless tobacco: Never Used  . Alcohol Use: Yes  . Drug Use: No  . Sexual Activity: Not on file  Other Topics Concern  . Not on file   Social History Narrative  . No narrative on file    Review of Systems:  All systems reviewed.  They are negative to the above problem except as previously stated.  Vital Signs: BP 123/61  Pulse 43  Ht 5' 7.5" (1.715 m)  Wt 197 lb (89.359 kg)  BMI 30.38 kg/m2  Physical Exam Patient is in NAD HEENT:  Normocephalic, atraumatic. EOMI, PERRLA.  Neck: JVP is normal.  No bruits.  Lungs: clear to auscultation. No rales no wheezes.  Heart: Regular rate and rhythm. Normal S1, S2. No S3.   No significant murmurs. PMI not displaced.  Abdomen:  Supple, nontender. Normal bowel sounds. No masses. No hepatomegaly.  Extremities:   Good distal pulses throughout. tr lower extremity edema.  Musculoskeletal :moving all extremities.  Neuro:   alert and oriented x3.  CN II-XII grossly intact.  EKG  SB  42   bpm  LAFB.   Assessment and Plan:  1.  Bradycardia  Patient's HR is slower   Will set up for holter monitor  May need treadmill walking to demonstrate chronotropic incompetence Will get TSH and CBC and BMET 1.  CAD  I am not convinced this represents active angina  2.  HTN  Keep on same regimen  3.  HL  Will get lipids today

## 2013-05-03 NOTE — Patient Instructions (Addendum)
Your physician has recommended that you wear a 48 hour  holter monitor. Holter monitors are medical devices that record the heart's electrical activity. Doctors most often use these monitors to diagnose arrhythmias. Arrhythmias are problems with the speed or rhythm of the heartbeat. The monitor is a small, portable device. You can wear one while you do your normal daily activities. This is usually used to diagnose what is causing palpitations/syncope (passing out).  Your physician recommends that you return for lab work in: today, bmet bnp cbc tsh, liver lipid   Your physician recommends that you schedule a follow-up appointment in: will depend on results.

## 2013-05-04 ENCOUNTER — Ambulatory Visit (INDEPENDENT_AMBULATORY_CARE_PROVIDER_SITE_OTHER): Payer: Medicare Other | Admitting: General Surgery

## 2013-05-04 LAB — BRAIN NATRIURETIC PEPTIDE: Pro B Natriuretic peptide (BNP): 73 pg/mL (ref 0.0–100.0)

## 2013-05-06 ENCOUNTER — Other Ambulatory Visit: Payer: Self-pay | Admitting: *Deleted

## 2013-05-06 DIAGNOSIS — R7989 Other specified abnormal findings of blood chemistry: Secondary | ICD-10-CM

## 2013-05-10 ENCOUNTER — Other Ambulatory Visit (INDEPENDENT_AMBULATORY_CARE_PROVIDER_SITE_OTHER): Payer: Medicare Other

## 2013-05-10 DIAGNOSIS — R946 Abnormal results of thyroid function studies: Secondary | ICD-10-CM | POA: Diagnosis not present

## 2013-05-10 DIAGNOSIS — R7989 Other specified abnormal findings of blood chemistry: Secondary | ICD-10-CM

## 2013-05-10 LAB — T4, FREE: Free T4: 0.99 ng/dL (ref 0.60–1.60)

## 2013-05-10 LAB — T3, FREE: T3, Free: 2.9 pg/mL (ref 2.3–4.2)

## 2013-05-10 LAB — TSH: TSH: 0.43 u[IU]/mL (ref 0.35–5.50)

## 2013-05-14 ENCOUNTER — Other Ambulatory Visit: Payer: Self-pay | Admitting: *Deleted

## 2013-05-14 ENCOUNTER — Telehealth: Payer: Self-pay | Admitting: *Deleted

## 2013-05-14 DIAGNOSIS — R001 Bradycardia, unspecified: Secondary | ICD-10-CM

## 2013-05-14 NOTE — Telephone Encounter (Signed)
Dr [name] spoke with pts son, the monitor shows sinus rhythm 27 to 107 bpm Average hr = 54 bpm Longest pause 2.83 sec.  Frequent PAC Short burst PAT   Dr [name] wants the pt to be set up for GXT to evaluate heart rate with exercise. Order placed

## 2013-05-15 ENCOUNTER — Other Ambulatory Visit: Payer: Self-pay | Admitting: Internal Medicine

## 2013-05-31 ENCOUNTER — Encounter (INDEPENDENT_AMBULATORY_CARE_PROVIDER_SITE_OTHER): Payer: Self-pay

## 2013-05-31 ENCOUNTER — Ambulatory Visit (INDEPENDENT_AMBULATORY_CARE_PROVIDER_SITE_OTHER): Payer: Medicare Other | Admitting: General Surgery

## 2013-05-31 ENCOUNTER — Encounter (INDEPENDENT_AMBULATORY_CARE_PROVIDER_SITE_OTHER): Payer: Self-pay | Admitting: General Surgery

## 2013-05-31 VITALS — BP 120/72 | Temp 97.1°F | Ht 68.0 in | Wt 196.8 lb

## 2013-05-31 DIAGNOSIS — K439 Ventral hernia without obstruction or gangrene: Secondary | ICD-10-CM

## 2013-05-31 NOTE — Progress Notes (Signed)
Patient ID: Brad Brad [name], male   DOB: 1927-05-26, 80 y.o.   MRN: [id number]  No chief complaint on file.   HPI Brad Brad [name] is a 80 y.o. male.  We are asked to see the patient in consultation by Dr. Redge Brad [name] to evaluate him for a ventral hernia. The patient is an 80 year old white male who has been having some discomfort lately along his lower abdomen. He has noticed a bulge in this location. He denies any nausea or vomiting. He denies any fevers chills. He denies any acute pain but does report occasional discomfort especially when trout fishing. He does have a significant cardiac history and sees Dr. Dorris Brad [name]  HPI  Past Medical History  Diagnosis Date  . HTN (hypertension)     Last echo 1/07:  Normal LVF, LV thickness upper limits of normal, mild aortic root dilatation, mild to mod MAC, mild LAE, borderline RVH, mild RAE.  . Tobacco abuse   . Ethanolism   . COPD (chronic obstructive pulmonary disease)   . Unilateral congenital absence of kidney   . CAD (coronary artery disease)     Brad [name]/p stent to OM2 in past;  Last LHC 3/05: EF 60%, left RA occluded, right RA ok, prox to mid LAD 60%, oD1 75-80%, oD2 small 75%, pOM1 50%, OM2 stent ok with 50% before stent, mRCA 30%, dRCA 30%, PDA 70%.  Last dobutamine myoview 5/06: no scar or ischemia, EF 60%.  Marland Kitchen HLD (hyperlipidemia)   . PAD (peripheral artery disease)   . Brad [name]/P AAA repair   . Ascending aortic aneurysm     Dr. Roxan Brad [name]  . DJD (degenerative joint disease)   . GERD (gastroesophageal reflux disease)     Past Surgical History  Procedure Laterality Date  . Appendectomy    . Hernia repair    . Resection and grafting of infarenal  abdominal  aortic aneurysm  05/03/2002    No family history on file.  Social History History  Substance Use Topics  . Smoking status: Former Smoker    Quit date: 05/06/1996  . Smokeless tobacco: Never Used  . Alcohol Use: Yes    Allergies  Allergen Reactions  . Aspirin Swelling  .  Atorvastatin Other (See Comments)    Leg pain, feet pain  . Cephalexin Other (See Comments)    unknown    Current Outpatient Prescriptions  Medication Sig Dispense Refill  . amLODipine (NORVASC) 5 MG tablet TAKE 1 TABLET (5 MG TOTAL) BY MOUTH DAILY.  30 tablet  3  . clopidogrel (PLAVIX) 75 MG tablet TAKE 1 TABLET BY MOUTH DAILY  30 tablet  6  . fish oil-omega-3 fatty acids 1000 MG capsule Take 1 g by mouth daily.        . furosemide (LASIX) 20 MG tablet TAKE 1 TABLET (20 MG TOTAL) BY MOUTH DAILY.  30 tablet  5  . isosorbide mononitrate (IMDUR) 30 MG 24 hr tablet TAKE 1 TABLET BY MOUTH ONCE A DAY  30 tablet  5  . KLOR-CON M20 20 MEQ tablet TAKE 1 TABLET (20 MEQ TOTAL) BY MOUTH DAILY.  30 tablet  5  . potassium chloride SA (K-DUR,KLOR-CON) 20 MEQ tablet TAKE 1 TABLET (20 MEQ TOTAL) BY MOUTH DAILY.  30 tablet  5  . rosuvastatin (CRESTOR) 10 MG tablet Take 0.5 tablets (5 mg total) by mouth daily.  30 tablet  0   No current facility-administered medications for this visit.    Review of Systems Review of Systems  Constitutional: Positive for activity change.  HENT: Negative.   Eyes: Negative.   Respiratory: Negative.   Cardiovascular: Negative.   Gastrointestinal: Negative.   Endocrine: Negative.   Genitourinary: Negative.   Musculoskeletal: Negative.   Skin: Negative.   Allergic/Immunologic: Negative.   Neurological: Negative.   Hematological: Negative.   Psychiatric/Behavioral: Negative.     Blood pressure 120/72, temperature 97.1 F (36.2 C), height 5\' 8"  (1.727 m), weight 196 lb 12.8 oz (89.268 kg).  Physical Exam Physical Exam  Constitutional: He is oriented to person, place, and time. He appears well-developed and well-nourished.  HENT:  Head: Normocephalic and atraumatic.  Eyes: Conjunctivae and EOM are normal. Pupils are equal, round, and reactive to light.  Neck: Normal range of motion. Neck supple.  Cardiovascular: Normal rate, regular rhythm and normal heart  sounds.   Pulmonary/Chest: Effort normal and breath sounds normal.  Abdominal: Soft. Bowel sounds are normal.  There is a small reducible hernia along his midline incision below his umbilicus. There appears to be a larger broader-based hernia along his upper midline incision. There is mild discomfort associated with the lower hernia. There is no discomfort at all associated with the upper hernia.  Musculoskeletal: Normal range of motion.  Neurological: He is alert and oriented to person, place, and time.  Skin: Skin is warm and dry.  Psychiatric: He has a normal mood and affect. His behavior is normal.    Data Reviewed As above  Assessment    The patient appears to have a ventral hernia which is most likely related to his previous abdominal surgery. Because of the risk of incarceration and strangulation he may benefit from having the hernia repaired. Given his cardiac history though he would probably be at significant risk for a big abdominal operation. I've discussed with him in detail the risks and benefits of the operation to fix the hernia as well as some of the technical aspects and he understands.     Plan    At this point we will plan to get cardiac clearance from his cardiologist to see if he would be a good candidate for ventral hernia repair with mesh        TOTH III,Brad Brad [name] 05/31/2013, 2:14 PM

## 2013-05-31 NOTE — Patient Instructions (Signed)
Will get cardiac clearance for ventral hernia repair with mesh

## 2013-06-10 ENCOUNTER — Encounter (INDEPENDENT_AMBULATORY_CARE_PROVIDER_SITE_OTHER): Payer: Self-pay

## 2013-06-10 ENCOUNTER — Ambulatory Visit (INDEPENDENT_AMBULATORY_CARE_PROVIDER_SITE_OTHER): Payer: Medicare Other | Admitting: Internal Medicine

## 2013-06-10 DIAGNOSIS — I498 Other specified cardiac arrhythmias: Secondary | ICD-10-CM

## 2013-06-10 DIAGNOSIS — R001 Bradycardia, unspecified: Secondary | ICD-10-CM

## 2013-06-10 DIAGNOSIS — I2510 Atherosclerotic heart disease of native coronary artery without angina pectoris: Secondary | ICD-10-CM

## 2013-06-10 NOTE — Progress Notes (Signed)
Exercise Treadmill Test  Pre-Exercise Testing Evaluation Rhythm: normal sinus  Rate: 51 bpm     Test  Exercise Tolerance Test Ordering MD: Dorris Carnes, MD  Interpreting MD: Dorris Carnes, MD  Unique Test No: 1  Treadmill:  1  Indication for ETT: known ASHD  Contraindication to ETT: Yes   Stress Modality: exercise - treadmill  Cardiac Imaging Performed: non   Protocol: Modified/ Maximal rate 1.5 miles/hour  0 incline  Max BP:  181/95  Max MPHR (bpm):  137 85% MPR (bpm):  109  MPHR obtained (bpm): 83 bpm   % MPHR obtained:    Reached 85% MPHR (min:sec):  Total Exercise Time (min-sec):  5:30  Workload in METS:  Borg Scale:  Reason ETT Terminated:  fatigue    ST Segment Analysis At Rest: Sinus bradycardia.  RBBB>  PAC With Exercise:  No ST changes to suggest ischemia   Other Information Arrhythmia:  No Angina during ETT:  absent (0)  Quality of ETT:  non-diagnostic  ETT Interpretation:    Walking on treadmill to 1.5 miles per hour patient's heart rate  increased to 80s (SR)  No evidence of signif heart block or chronontropic incompetence  Recommendations Continue current regimen .

## 2013-06-24 ENCOUNTER — Ambulatory Visit (INDEPENDENT_AMBULATORY_CARE_PROVIDER_SITE_OTHER): Payer: Medicare Other | Admitting: General Surgery

## 2013-06-24 ENCOUNTER — Encounter (INDEPENDENT_AMBULATORY_CARE_PROVIDER_SITE_OTHER): Payer: Self-pay | Admitting: General Surgery

## 2013-06-24 VITALS — BP 96/54 | HR 50 | Resp 12 | Ht 68.0 in | Wt 199.2 lb

## 2013-06-24 DIAGNOSIS — K439 Ventral hernia without obstruction or gangrene: Secondary | ICD-10-CM

## 2013-06-24 NOTE — Patient Instructions (Signed)
Call if you develop belly pain

## 2013-06-24 NOTE — Progress Notes (Signed)
Subjective:     Patient ID: Brad [name], male   DOB: Aug 28, 1927, 80 y.o.   MRN: [id number]  HPI The patient is a 80 year old white male who we saw recently with a ventral hernia. He denies much in the way of abdominal pain. His appetite is good and his bowels are working normally. He is currently the sole caregiver for his wife who has advanced Alzheimer's dementia. Because of this he is unable to really agreed any type of surgery right now. He did recently have a stress test by the cardiologist but there is no message as of this time as to whether he would be cleared for surgery and not. He currently feels weak because he has not been getting much sleep because it was going on with his wife.  Review of Systems  Constitutional: Positive for fatigue.  HENT: Negative.   Eyes: Negative.   Respiratory: Positive for shortness of breath.   Cardiovascular: Negative.   Gastrointestinal: Negative.   Endocrine: Negative.   Genitourinary: Negative.   Musculoskeletal: Negative.   Skin: Negative.   Allergic/Immunologic: Negative.   Neurological: Positive for dizziness.  Hematological: Negative.   Psychiatric/Behavioral: Negative.        Objective:   Physical Exam  Constitutional: He is oriented to person, place, and time. He appears well-developed and well-nourished.  HENT:  Head: Normocephalic and atraumatic.  Eyes: Conjunctivae and EOM are normal. Pupils are equal, round, and reactive to light.  Neck: Normal range of motion. Neck supple.  Cardiovascular: Normal rate, regular rhythm and normal heart sounds.   Pulmonary/Chest: Effort normal and breath sounds normal.  Abdominal: Soft. Bowel sounds are normal.  There is a bulge along the lower abdominal wall that reduces easily  Musculoskeletal: Normal range of motion.  Neurological: He is alert and oriented to person, place, and time.  Skin: Skin is warm and dry.  Psychiatric: He has a normal mood and affect. His behavior is normal.       Assessment:     The patient appears to have a ventral hernia and seems to currently have minimal symptoms from it.     Plan:     Given his underlying cardiac condition I think he is at increased risk for a big abdominal operation. At this point because of the health of his life he does not want to have anything done right now. He understands the signs and symptoms of incarceration and strangulation if he develops any abdominal pain he agrees to call us right away. Otherwise we will plan to see him back in about 6 months to check his progress. We will also continue to try to get cardiac clearance from his cardiologist.

## 2013-06-28 DIAGNOSIS — H353190 Nonexudative age-related macular degeneration, unspecified eye, stage unspecified: Secondary | ICD-10-CM | POA: Diagnosis not present

## 2013-07-03 ENCOUNTER — Other Ambulatory Visit: Payer: Self-pay | Admitting: Family Medicine

## 2013-07-17 ENCOUNTER — Other Ambulatory Visit: Payer: Self-pay | Admitting: Internal Medicine

## 2013-08-13 ENCOUNTER — Telehealth: Payer: Self-pay | Admitting: Internal Medicine

## 2013-08-13 NOTE — Telephone Encounter (Signed)
New Message:  Pt son's is calling to see if his dad is cleared for hernia surgery.. Pt's son would like a call back form one of the nurses.

## 2013-09-02 NOTE — Telephone Encounter (Signed)
Called patient.  I would not pursue elective hernia repair.  Feel he is at increased risk with his other medical problems.  He is comfortable.  No complaints.  Would follow.

## 2013-09-11 ENCOUNTER — Other Ambulatory Visit: Payer: Self-pay | Admitting: Internal Medicine

## 2013-09-18 ENCOUNTER — Other Ambulatory Visit: Payer: Self-pay | Admitting: Internal Medicine

## 2013-10-30 ENCOUNTER — Other Ambulatory Visit: Payer: Self-pay | Admitting: Family Medicine

## 2013-11-01 NOTE — Telephone Encounter (Signed)
Patient NTBS for follow up and lab work  

## 2013-11-01 NOTE — Telephone Encounter (Signed)
Last ov 12/14. 

## 2013-11-09 ENCOUNTER — Ambulatory Visit (INDEPENDENT_AMBULATORY_CARE_PROVIDER_SITE_OTHER): Payer: Medicare Other | Admitting: Physician Assistant

## 2013-11-09 ENCOUNTER — Encounter: Payer: Self-pay | Admitting: Physician Assistant

## 2013-11-09 ENCOUNTER — Telehealth: Payer: Self-pay | Admitting: *Deleted

## 2013-11-09 VITALS — BP 126/74 | HR 59 | Temp 97.6°F | Ht 68.0 in

## 2013-11-09 DIAGNOSIS — I2510 Atherosclerotic heart disease of native coronary artery without angina pectoris: Secondary | ICD-10-CM | POA: Diagnosis not present

## 2013-11-09 DIAGNOSIS — R351 Nocturia: Secondary | ICD-10-CM

## 2013-11-09 DIAGNOSIS — R300 Dysuria: Secondary | ICD-10-CM

## 2013-11-09 LAB — POCT URINALYSIS DIPSTICK
BILIRUBIN UA: NEGATIVE
Blood, UA: NEGATIVE
GLUCOSE UA: NEGATIVE
Ketones, UA: NEGATIVE
Leukocytes, UA: NEGATIVE
NITRITE UA: NEGATIVE
Spec Grav, UA: 1.02
Urobilinogen, UA: NEGATIVE
pH, UA: 5

## 2013-11-09 LAB — POCT UA - MICROSCOPIC ONLY
Casts, Ur, LPF, POC: NEGATIVE
Crystals, Ur, HPF, POC: NEGATIVE
Epithelial cells, urine per micros: NEGATIVE
Yeast, UA: NEGATIVE

## 2013-11-09 NOTE — Progress Notes (Signed)
Subjective:     Patient ID: Brad [name], male   DOB: 15-Oct-1927, 80 y.o.   MRN: [id number]  HPI Pt with chronic voiding sx He would like to make sure not due to UTI Hx of BPH and prev TURP  Review of Systems  Gastrointestinal: Negative for abdominal pain and abdominal distention.  Genitourinary: Positive for urgency, frequency, decreased urine volume and difficulty urinating. Negative for dysuria, hematuria, discharge and testicular pain.       + Nocturia x 3 No flank pain       Objective:   Physical Exam  Nursing note and vitals reviewed. Pulmonary/Chest: Breath sounds normal.  Abdominal: Soft. Normal appearance and bowel sounds are normal. He exhibits no distension and no mass. There is hepatosplenomegaly. There is no tenderness. There is no rebound, no guarding and no CVA tenderness.  UA- nl     Assessment:     Nocturia    Plan:     Given chronic nature and hx sx are prob related to BPH Due to age and SE of meds would like to refer to [facility]rol for eval Pt would like to think over since is wife is currently in the hosp

## 2013-11-09 NOTE — Patient Instructions (Signed)
Benign Prostatic Hyperplasia An enlarged prostate (benign prostatic hyperplasia) is common in older men. You may experience the following:  Weak urine stream.  Dribbling.  Feeling like the bladder has not emptied completely.  Difficulty starting urination.  Getting up frequently at night to urinate.  Urinating more frequently during the day. HOME CARE INSTRUCTIONS  Monitor your prostatic hyperplasia for any changes. The following actions may help to alleviate any discomfort you are experiencing:  Give yourself time when you urinate.  Stay away from alcohol.  Avoid beverages containing caffeine, such as coffee, tea, and colas, because they can make the problem worse.  Avoid decongestants, antihistamines, and some prescription medicines that can make the problem worse.  Follow up with your health care provider for further treatment as recommended. SEEK MEDICAL CARE IF:  You are experiencing progressive difficulty voiding.  Your urine stream is progressively getting narrower.  You are awaking from sleep with the urge to void more frequently.  You are constantly feeling the need to void.  You experience loss of urine, especially in small amounts. SEEK IMMEDIATE MEDICAL CARE IF:   You develop increased pain with urination or are unable to urinate.  You develop severe abdominal pain, vomiting, a high fever, or fainting.  You develop back pain or blood in your urine. MAKE SURE YOU:   Understand these instructions.  Will watch your condition.  Will get help right away if you are not doing well or get worse. Document Released: 05/06/2005 Document Revised: 01/06/2013 Document Reviewed: 10/06/2012 [employer] Patient Information 2015 [employer]. This information is not intended to replace advice given to you by your health care provider. Make sure you discuss any questions you have with your health care provider.  

## 2013-11-13 ENCOUNTER — Other Ambulatory Visit: Payer: Self-pay | Admitting: Internal Medicine

## 2013-11-16 NOTE — Addendum Note (Signed)
Addended by: Lodema Pilot on: 11/16/2013 04:29 PM   Modules accepted: Orders

## 2013-11-22 ENCOUNTER — Other Ambulatory Visit: Payer: Self-pay | Admitting: Internal Medicine

## 2013-12-13 ENCOUNTER — Encounter: Payer: Self-pay | Admitting: Family Medicine

## 2013-12-13 ENCOUNTER — Ambulatory Visit (INDEPENDENT_AMBULATORY_CARE_PROVIDER_SITE_OTHER): Payer: Medicare Other | Admitting: Family Medicine

## 2013-12-13 VITALS — BP 122/72 | HR 66 | Temp 97.0°F | Ht 68.0 in | Wt 196.0 lb

## 2013-12-13 DIAGNOSIS — R21 Rash and other nonspecific skin eruption: Secondary | ICD-10-CM | POA: Diagnosis not present

## 2013-12-13 DIAGNOSIS — I2510 Atherosclerotic heart disease of native coronary artery without angina pectoris: Secondary | ICD-10-CM

## 2013-12-13 DIAGNOSIS — R221 Localized swelling, mass and lump, neck: Secondary | ICD-10-CM

## 2013-12-13 DIAGNOSIS — R3911 Hesitancy of micturition: Secondary | ICD-10-CM

## 2013-12-13 DIAGNOSIS — R220 Localized swelling, mass and lump, head: Secondary | ICD-10-CM

## 2013-12-13 DIAGNOSIS — R3989 Other symptoms and signs involving the genitourinary system: Secondary | ICD-10-CM

## 2013-12-13 LAB — POCT UA - MICROSCOPIC ONLY
Casts, Ur, LPF, POC: NEGATIVE
Crystals, Ur, HPF, POC: NEGATIVE
Yeast, UA: NEGATIVE

## 2013-12-13 LAB — POCT URINALYSIS DIPSTICK
Bilirubin, UA: NEGATIVE
Glucose, UA: NEGATIVE
Ketones, UA: NEGATIVE
Leukocytes, UA: NEGATIVE
Nitrite, UA: NEGATIVE
Spec Grav, UA: 1.02
Urobilinogen, UA: NEGATIVE
pH, UA: 5

## 2013-12-13 MED ORDER — TAMSULOSIN HCL 0.4 MG PO CAPS: 0.4000 mg | ORAL_CAPSULE | Freq: Every day | ORAL | Status: DC

## 2013-12-13 MED ORDER — CIPROFLOXACIN HCL 250 MG PO TABS: 250.0000 mg | ORAL_TABLET | Freq: Two times a day (BID) | ORAL | Status: DC

## 2013-12-13 MED ORDER — HYDROXYZINE HCL 25 MG PO TABS: 25.0000 mg | ORAL_TABLET | Freq: Three times a day (TID) | ORAL | Status: DC | PRN

## 2013-12-13 MED ORDER — METHYLPREDNISOLONE ACETATE 80 MG/ML IJ SUSP
80.0000 mg | Freq: Once | INTRAMUSCULAR | Status: AC
  Administered 2013-12-13: 80 mg via INTRAMUSCULAR

## 2013-12-13 NOTE — Progress Notes (Signed)
   Subjective:    Patient ID: Brad [name], male    DOB: 1927/12/16, 80 y.o.   MRN: [id number]  HPI This 80 y.o. male presents for evaluation of urinary frequency.  He has been referred to urology but didn't go because his wife was sick.  His wife just recently passed away.  He has been having a rash on his legs and thighs and is itching a lot.  He has a mass on the back of his neck.  The mass has been there for awhile he states it is getting bigger.   Review of Systems C/o neck mass and rash No chest pain, SOB, HA, dizziness, vision change, N/V, diarrhea, constipation, dysuria, urinary urgency or frequency, myalgias, arthralgias.     Objective:   Physical Exam Vital signs noted  Well developed well nourished male.  HEENT - Head atraumatic Normocephalic                Eyes - PERRLA, Conjuctiva - clear Sclera- Clear EOMI                Ears - EAC's Wnl TM's Wnl Gross Hearing WNL                Nose - Nares patent                 Throat - oropharanx wnl                Neck - Mass on right posterior neck Respiratory - Lungs CTA bilateral Cardiac - RRR S1 and S2 without murmur GI - Abdomen soft Nontender and bowel sounds active x 4 Extremities - No edema. Neuro - Grossly intact.       Assessment & Plan:  Urine troubles - Plan: POCT urinalysis dipstick, POCT UA - Microscopic Only, POCT urinalysis dipstick, POCT UA - Microscopic Only, tamsulosin (FLOMAX) 0.4 MG CAPS capsule, ciprofloxacin (CIPRO) 250 MG tablet  Rash and nonspecific skin eruption - Plan: methylPREDNISolone acetate (DEPO-MEDROL) injection 80 mg, hydrOXYzine (ATARAX/VISTARIL) 25 MG tablet  Neck mass - Plan: US Soft Tissue Head/Neck  Joh Rao J Marquetta Weiskopf FNP

## 2013-12-16 ENCOUNTER — Other Ambulatory Visit: Payer: Medicare Other

## 2013-12-17 ENCOUNTER — Ambulatory Visit
Admission: RE | Admit: 2013-12-17 | Discharge: 2013-12-17 | Disposition: A | Discharge status: Home / Self Care | Payer: Medicare Other | Source: Ambulatory Visit | Attending: Family Medicine | Admitting: Family Medicine

## 2013-12-17 DIAGNOSIS — R221 Localized swelling, mass and lump, neck: Secondary | ICD-10-CM

## 2013-12-17 DIAGNOSIS — D1739 Benign lipomatous neoplasm of skin and subcutaneous tissue of other sites: Secondary | ICD-10-CM | POA: Diagnosis not present

## 2013-12-22 ENCOUNTER — Telehealth: Payer: Self-pay | Admitting: Family Medicine

## 2013-12-22 NOTE — Telephone Encounter (Signed)
Message copied by Waverly Ferrari on Wed Dec 22, 2013  8:27 AM ------      Message from: Lysbeth Penner      Created: Sat Dec 18, 2013 10:25 AM       US shows fatty tumor called lipoma and it is benign and not cancer. ------

## 2013-12-27 ENCOUNTER — Ambulatory Visit (INDEPENDENT_AMBULATORY_CARE_PROVIDER_SITE_OTHER): Payer: Medicare Other | Admitting: General Surgery

## 2013-12-27 ENCOUNTER — Encounter (INDEPENDENT_AMBULATORY_CARE_PROVIDER_SITE_OTHER): Payer: Self-pay | Admitting: General Surgery

## 2013-12-27 VITALS — BP 114/62 | HR 58 | Resp 18 | Ht 68.0 in | Wt 194.4 lb

## 2013-12-27 DIAGNOSIS — K439 Ventral hernia without obstruction or gangrene: Secondary | ICD-10-CM

## 2013-12-27 DIAGNOSIS — I2510 Atherosclerotic heart disease of native coronary artery without angina pectoris: Secondary | ICD-10-CM

## 2013-12-27 NOTE — Patient Instructions (Signed)
Call for pain or vomiting

## 2013-12-27 NOTE — Progress Notes (Signed)
Subjective:     Patient ID: Brad [name], male   DOB: April 29, 1928, 80 y.o.   MRN: [id number]  HPI The patient is an 80 year old white male who we have been following for some time for ventral hernias. His cardiologist in the past has advised [place] not to fix him unless it is an emergency. He denies any abdominal pain. His appetite is good and his bowels are working normally. Since his last visit he also lost his wife who was battling Alzheimer's disease.  Review of Systems  Constitutional: Negative.   HENT: Negative.   Eyes: Negative.   Respiratory: Negative.   Cardiovascular: Negative.   Gastrointestinal: Negative.   Endocrine: Negative.   Genitourinary: Negative.   Musculoskeletal: Negative.   Skin: Negative.   Allergic/Immunologic: Negative.   Neurological: Negative.   Hematological: Negative.   Psychiatric/Behavioral: Negative.        Objective:   Physical Exam  Constitutional: He is oriented to person, place, and time. He appears well-developed and well-nourished.  HENT:  Head: Normocephalic and atraumatic.  Eyes: Conjunctivae and EOM are normal. Pupils are equal, round, and reactive to light.  Neck: Normal range of motion. Neck supple.  Cardiovascular: Normal rate, regular rhythm and normal heart sounds.   Pulmonary/Chest: Effort normal and breath sounds normal.  Abdominal: Soft. Bowel sounds are normal.  Feels as though he has a ventral hernia along the upper portion of his midline incision as well as along the lower portion with a bulge just to the right of midline. All of this reduces easily and is nontender.  Musculoskeletal: Normal range of motion.  Neurological: He is alert and oriented to person, place, and time.  Skin: Skin is warm and dry.  Psychiatric: He has a normal mood and affect. His behavior is normal.       Assessment:     The patient has known ventral hernias that at this point are not interfering with his lifestyle.     Plan:     Because of his  age and comorbid conditions we have not fixed his hernias. We will continue to watch him. I will plan to see him back in 6 months. He agrees to call me if he has any problems in the meantime

## 2013-12-29 ENCOUNTER — Other Ambulatory Visit: Payer: Self-pay | Admitting: Family Medicine

## 2013-12-30 NOTE — Telephone Encounter (Signed)
These were originally rxd on 12-13-13. Please advise on refill

## 2014-01-08 ENCOUNTER — Other Ambulatory Visit: Payer: Self-pay | Admitting: Internal Medicine

## 2014-01-15 ENCOUNTER — Other Ambulatory Visit: Payer: Self-pay | Admitting: Internal Medicine

## 2014-01-24 ENCOUNTER — Other Ambulatory Visit: Payer: Self-pay | Admitting: Internal Medicine

## 2014-01-24 DIAGNOSIS — Z23 Encounter for immunization: Secondary | ICD-10-CM | POA: Diagnosis not present

## 2014-01-25 DIAGNOSIS — N139 Obstructive and reflux uropathy, unspecified: Secondary | ICD-10-CM | POA: Diagnosis not present

## 2014-01-25 DIAGNOSIS — N401 Enlarged prostate with lower urinary tract symptoms: Secondary | ICD-10-CM | POA: Diagnosis not present

## 2014-01-25 DIAGNOSIS — R351 Nocturia: Secondary | ICD-10-CM | POA: Diagnosis not present

## 2014-01-29 ENCOUNTER — Other Ambulatory Visit: Payer: Self-pay | Admitting: Nurse Practitioner

## 2014-01-31 NOTE — Telephone Encounter (Signed)
Last ov 7/15. 

## 2014-02-01 DIAGNOSIS — S058X9A Other injuries of unspecified eye and orbit, initial encounter: Secondary | ICD-10-CM | POA: Diagnosis not present

## 2014-02-01 DIAGNOSIS — T1510XA Foreign body in conjunctival sac, unspecified eye, initial encounter: Secondary | ICD-10-CM | POA: Diagnosis not present

## 2014-02-26 ENCOUNTER — Other Ambulatory Visit: Payer: Self-pay | Admitting: Family Medicine

## 2014-02-26 ENCOUNTER — Other Ambulatory Visit: Payer: Self-pay | Admitting: Nurse Practitioner

## 2014-02-26 ENCOUNTER — Other Ambulatory Visit: Payer: Self-pay | Admitting: Internal Medicine

## 2014-03-05 ENCOUNTER — Other Ambulatory Visit: Payer: Self-pay | Admitting: Internal Medicine

## 2014-03-05 NOTE — Telephone Encounter (Signed)
Rx was sent to pharmacy electronically. 

## 2014-03-07 DIAGNOSIS — R3912 Poor urinary stream: Secondary | ICD-10-CM | POA: Diagnosis not present

## 2014-03-07 DIAGNOSIS — N401 Enlarged prostate with lower urinary tract symptoms: Secondary | ICD-10-CM | POA: Diagnosis not present

## 2014-03-07 DIAGNOSIS — R351 Nocturia: Secondary | ICD-10-CM | POA: Diagnosis not present

## 2014-03-11 ENCOUNTER — Other Ambulatory Visit: Payer: Self-pay | Admitting: *Deleted

## 2014-03-11 DIAGNOSIS — I712 Thoracic aortic aneurysm, without rupture: Secondary | ICD-10-CM

## 2014-03-11 DIAGNOSIS — I7121 Aneurysm of the ascending aorta, without rupture: Secondary | ICD-10-CM

## 2014-03-19 ENCOUNTER — Other Ambulatory Visit: Payer: Self-pay | Admitting: Internal Medicine

## 2014-04-08 ENCOUNTER — Other Ambulatory Visit: Payer: Self-pay | Admitting: Thoracic Surgery (Cardiothoracic Vascular Surgery)

## 2014-04-08 DIAGNOSIS — I712 Thoracic aortic aneurysm, without rupture: Secondary | ICD-10-CM | POA: Diagnosis not present

## 2014-04-08 LAB — CREATININE, SERUM: Creat: 0.8 mg/dL (ref 0.50–1.35)

## 2014-04-08 LAB — BUN: BUN: 16 mg/dL (ref 6–23)

## 2014-04-12 ENCOUNTER — Ambulatory Visit
Admission: RE | Admit: 2014-04-12 | Discharge: 2014-04-12 | Disposition: A | Discharge status: Home / Self Care | Payer: Medicare Other | Source: Ambulatory Visit | Attending: Thoracic Surgery (Cardiothoracic Vascular Surgery) | Admitting: Thoracic Surgery (Cardiothoracic Vascular Surgery)

## 2014-04-12 ENCOUNTER — Encounter: Payer: Self-pay | Admitting: Thoracic Surgery (Cardiothoracic Vascular Surgery)

## 2014-04-12 ENCOUNTER — Ambulatory Visit (INDEPENDENT_AMBULATORY_CARE_PROVIDER_SITE_OTHER): Payer: Medicare Other | Admitting: Thoracic Surgery (Cardiothoracic Vascular Surgery)

## 2014-04-12 VITALS — BP 117/67 | HR 50 | Resp 20 | Ht 68.0 in | Wt 194.0 lb

## 2014-04-12 DIAGNOSIS — I2510 Atherosclerotic heart disease of native coronary artery without angina pectoris: Secondary | ICD-10-CM | POA: Diagnosis not present

## 2014-04-12 DIAGNOSIS — I7121 Aneurysm of the ascending aorta, without rupture: Secondary | ICD-10-CM

## 2014-04-12 DIAGNOSIS — I712 Thoracic aortic aneurysm, without rupture: Secondary | ICD-10-CM

## 2014-04-12 MED ORDER — IOHEXOL 350 MG/ML SOLN
80.0000 mL | Freq: Once | INTRAVENOUS | Status: AC | PRN
  Administered 2014-04-12: 80 mL via INTRAVENOUS

## 2014-04-12 NOTE — Progress Notes (Signed)
HPI:  Brad [name] returns today for a scheduled 1 year follow-up visit of his thoracic aortic aneurysm.  We have been following him dating back to 2003. Over time the aneurysm has grown slightly. He was last seen in the office a year ago. The aneurysm was 4.6 cm which was unchanged for a year.   In the interim since his last visit a year ago, he has not had any new cardiac issues. He is more active now. His wife passed away in 10/24/2022. He has been fishing a lot recently.  He denies any chest pain but says he has very little energy and gets "worn out" walking to his mailbox.   Past Medical History  Diagnosis Date  . HTN (hypertension)     Last echo 1/07:  Normal LVF, LV thickness upper limits of normal, mild aortic root dilatation, mild to mod MAC, mild LAE, borderline RVH, mild RAE.  . Tobacco abuse   . Ethanolism   . COPD (chronic obstructive pulmonary disease)   . Unilateral congenital absence of kidney   . CAD (coronary artery disease)     s/p stent to OM2 in past;  Last LHC 3/05: EF 60%, left RA occluded, right RA ok, prox to mid LAD 60%, oD1 75-80%, oD2 small 75%, pOM1 50%, OM2 stent ok with 50% before stent, mRCA 30%, dRCA 30%, PDA 70%.  Last dobutamine myoview 5/06: no scar or ischemia, EF 60%.  Marland Kitchen HLD (hyperlipidemia)   . PAD (peripheral artery disease)   . S/P AAA repair   . Ascending aortic aneurysm     Dr. Roxan Hockey  . DJD (degenerative joint disease)   . GERD (gastroesophageal reflux disease)      Current Outpatient Prescriptions  Medication Sig Dispense Refill  . amLODipine (NORVASC) 5 MG tablet TAKE 1 TABLET (5 MG TOTAL) BY MOUTH DAILY. 30 tablet 2  . clopidogrel (PLAVIX) 75 MG tablet TAKE 1 TABLET BY MOUTH DAILY 30 tablet 1  . CRESTOR 10 MG tablet TAKE 1/2 TABLETS (5 MG TOTAL) BY MOUTH DAILY. 30 tablet 0  . fish oil-omega-3 fatty acids 1000 MG capsule Take 1 g by mouth daily.      . furosemide (LASIX) 20 MG tablet TAKE 1 TABLET (20 MG TOTAL) BY MOUTH DAILY. 30  tablet 11  . isosorbide mononitrate (IMDUR) 30 MG 24 hr tablet TAKE 1 TABLET BY MOUTH ONCE A DAY 30 tablet 3  . potassium chloride SA (K-DUR,KLOR-CON) 20 MEQ tablet TAKE 1 TABLET (20 MEQ TOTAL) BY MOUTH DAILY. 30 tablet 5  . tamsulosin (FLOMAX) 0.4 MG CAPS capsule Take 1 capsule (0.4 mg total) by mouth daily. 30 capsule 3   No current facility-administered medications for this visit.    Physical Exam BP 117/67 mmHg  Pulse 50  Resp 20  Ht 5\' 8"  (1.727 m)  Wt 194 lb (87.998 kg)  BMI 29.50 kg/m2  SpO2 95% Elderly man in NAD  Alert and oriented 3 with no focal neurologic deficit Cardiac: RRR, + systolic murmur Lungs diminished, but clear bilaterally Abdominal binder in place for ventral hernias Papular rash on legs bilaterally  Diagnostic Tests: CT ANGIOGRAPHY CHEST WITH CONTRAST  TECHNIQUE: Multidetector CT imaging of the chest was performed using the standard protocol during bolus administration of intravenous contrast. Multiplanar CT image reconstructions and MIPs were obtained to evaluate the vascular anatomy.  CONTRAST: 61mL OMNIPAQUE IOHEXOL 350 MG/ML SOLN  COMPARISON: Prior CTA of the chest 04/06/2013 and 03/31/2012  FINDINGS: Mediastinum: Unremarkable CT appearance of the  thyroid gland. No suspicious mediastinal or hilar adenopathy. No soft tissue mediastinal mass. The thoracic esophagus is unremarkable.  Heart/Vascular: Stable fusiform aneurysmal dilatation of the ascending thoracic aorta with a maximal diameter of 4.6 cm measured in the proximal tubular portion and again at the level of the pulmonary trunk. The aneurysmal dilatation extends into the proximal aortic arch or the aorta measures 3.6 cm in transverse diameter. The distal arch and descending thoracic aorta are mildly ectatic without significant interval change. There is a small penetrating atherosclerotic ulcer arising from the lateral aspect of the aortic isthmus (image 37 series 4). The  penetrating ulcer measures approximately 9 x 5 mm and is unchanged across multiple studies dating back to at least 2012. There are scattered atherosclerotic vascular calcifications without focal stenosis. 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 This is unchanged compared to prior. The arch vessels are highly tortuous.  The left atrial appendage is well opacified. There is no internal thrombus. Borderline cardiomegaly with by atrial enlargement. Extensive atherosclerotic vascular calcifications present throughout the coronary arteries. No pericardial effusion. The bronchial artery share a common trunk which arises from the proximal descending thoracic aorta. The arteries are mildly prominent.  Lungs/Pleura: No pleural effusion. Background of moderately severe centrilobular emphysema. Stable partially calcified 1 cm ovoid nodule within the middle fissure. Additional small scattered subpleural nodules/lymph nodes are also unchanged compared to prior imaging. Greater than 2 year stability is consistent with a benign process. No suspicious pulmonary mass or nodule. Calcified granuloma in the left lower lobe remains unchanged. No focal airspace consolidation or evidence of a pulmonary edema.  Bones/Soft Tissues: No acute fracture or aggressive appearing lytic or blastic osseous lesion. T6 hemangioma.  Upper Abdomen: Visualized upper abdominal organs are unremarkable.  Review of the MIP images confirms the above findings.  IMPRESSION: VASCULAR  1. Stable fusiform aneurysmal dilatation of the ascending thoracic aorta with a maximal diameter of 4.6 cm. 2. Continued stability of a 9 x 5 mm penetrating atherosclerotic ulcer arising from the region of the aortic isthmus. This finding remains unchanged dating back to at least 2012. 3. 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 4. Extensive  atherosclerosis including multivessel coronary artery disease. 5. Cardiomegaly with biatrial enlargement. NON VASCULAR  1. Moderately severe centrilobular emphysema. 2. Additional ancillary findings as above without significant interval change. Signed,  Criselda Peaches, MD  [facility]  [facility]   Electronically Signed  By: Jacqulynn Cadet M.D.  On : 04/12/2014 14:10  Impression: 80 year old gentleman with a stable 4.6 cm ascending aortic aneurysm. He also has a small penetrating ulcer in the proximal descending near the ligamentum. That likewise is unchanged.  There is no indication for surgery at present time.  I did briefly discuss with Brad [name] and his son having a plan is to how they would like to proceed were he to have an aortic catastrophe and present to the emergency room. They will discuss some of those issues as a family.  Plan: Return in one year with CT in June of chest

## 2014-04-21 ENCOUNTER — Other Ambulatory Visit: Payer: Self-pay | Admitting: Family Medicine

## 2014-04-29 ENCOUNTER — Telehealth: Payer: Self-pay | Admitting: Internal Medicine

## 2014-04-29 NOTE — Telephone Encounter (Signed)
Spoke with son [name] last night around 9pm said he stood up too quick fell down & hit the floor. Small cut on right elbow & knee. Son states this is the first occurrence he is aware of. They will keep a record of his blood pressure. He does not think pt is dehydrated.  Son will call Dr. Tawanna Sat office. He is due for an appointment with Dr. Harrington Challenger Appt made with Dr. Harrington Challenger 05/02/14 at 8:30 am. [name], will bring him.  Horton Chin RN

## 2014-04-29 NOTE — Telephone Encounter (Signed)
New message      Pt had a "spell" yesterday.  He stood up, got swimmy headed, and went down to the floor.  He did not pass out. Do you think he needs his medication adjusted?

## 2014-05-02 ENCOUNTER — Encounter: Payer: Self-pay | Admitting: Internal Medicine

## 2014-05-02 ENCOUNTER — Ambulatory Visit (INDEPENDENT_AMBULATORY_CARE_PROVIDER_SITE_OTHER): Payer: Medicare Other | Admitting: Internal Medicine

## 2014-05-02 VITALS — BP 126/76 | HR 48 | Ht 67.0 in | Wt 199.4 lb

## 2014-05-02 DIAGNOSIS — R001 Bradycardia, unspecified: Secondary | ICD-10-CM | POA: Diagnosis not present

## 2014-05-02 DIAGNOSIS — I10 Essential (primary) hypertension: Secondary | ICD-10-CM | POA: Diagnosis not present

## 2014-05-02 DIAGNOSIS — R0602 Shortness of breath: Secondary | ICD-10-CM

## 2014-05-02 DIAGNOSIS — E785 Hyperlipidemia, unspecified: Secondary | ICD-10-CM

## 2014-05-02 DIAGNOSIS — I25700 Atherosclerosis of coronary artery bypass graft(s), unspecified, with unstable angina pectoris: Secondary | ICD-10-CM | POA: Diagnosis not present

## 2014-05-02 LAB — CBC WITH DIFFERENTIAL/PLATELET
BASOS ABS: 0 10*3/uL (ref 0.0–0.1)
Basophils Relative: 0.4 % (ref 0.0–3.0)
EOS ABS: 0.5 10*3/uL (ref 0.0–0.7)
Eosinophils Relative: 7.8 % — ABNORMAL HIGH (ref 0.0–5.0)
HCT: 42.4 % (ref 39.0–52.0)
Hemoglobin: 13.9 g/dL (ref 13.0–17.0)
Lymphocytes Relative: 26.9 % (ref 12.0–46.0)
Lymphs Abs: 1.7 10*3/uL (ref 0.7–4.0)
MCHC: 32.8 g/dL (ref 30.0–36.0)
MCV: 97 fl (ref 78.0–100.0)
Monocytes Absolute: 0.6 10*3/uL (ref 0.1–1.0)
Monocytes Relative: 9.3 % (ref 3.0–12.0)
NEUTROS PCT: 55.6 % (ref 43.0–77.0)
Neutro Abs: 3.6 10*3/uL (ref 1.4–7.7)
Platelets: 183 10*3/uL (ref 150.0–400.0)
RBC: 4.38 Mil/uL (ref 4.22–5.81)
RDW: 13.5 % (ref 11.5–15.5)
WBC: 6.4 10*3/uL (ref 4.0–10.5)

## 2014-05-02 LAB — BASIC METABOLIC PANEL
BUN: 15 mg/dL (ref 6–23)
CHLORIDE: 105 meq/L (ref 96–112)
CO2: 28 mEq/L (ref 19–32)
Calcium: 9 mg/dL (ref 8.4–10.5)
Creatinine, Ser: 0.6 mg/dL (ref 0.4–1.5)
GFR: 126.38 mL/min (ref >60.00)
Glucose, Bld: 102 mg/dL — ABNORMAL HIGH (ref 70–99)
POTASSIUM: 3.9 meq/L (ref 3.5–5.1)
Sodium: 138 mEq/L (ref 135–145)

## 2014-05-02 LAB — TSH: TSH: 0.49 u[IU]/mL (ref 0.35–4.50)

## 2014-05-02 LAB — BRAIN NATRIURETIC PEPTIDE: Pro B Natriuretic peptide (BNP): 75 pg/mL (ref 0.0–100.0)

## 2014-05-02 MED ORDER — BETAMETHASONE VALERATE 0.1 % EX CREA: TOPICAL_CREAM | Freq: Two times a day (BID) | CUTANEOUS | Status: DC

## 2014-05-02 NOTE — Progress Notes (Signed)
HPI Patinet is an 80 year old with a hsitory of CAD, dyslipidemai, PAD, DJD, COPD, HTN. GERD. S/p AAA repari. Last cath in March 2005: LVEF 60%. L RA occluded. R RA ok. 60% prox/mid LD; 75 ot80% D1, 75% small D2, OM2 stent OK 50% prior; PDA 70%. Dobbutamine myoview 5/06: NO ischemia or scar. Echo 1/07: Normal LVEF Mild aortic root dlitation.  I have seen the patinet for bradycardia  He has worn a holter  This showed no severe bradycardia/pauses. He has also tried to walk on a treadmill which was difficult. He comes in today with his son.  He fell this past week  Was at home  Watching TV  Got up  Walking to kitchen  Got light headed  [address]  Denies syncope  Was aware of all of event.Slowly got up.   Otherwise he notes occasional dizzy spells, not as severe   Breathing is OK NO CP  Went fishing this past weekend. The patient presents on referral of primary MD for bradycardia  He has noticed increased fatigue.  Gets more SOB with activty.  NO CP Occasionally dizzy  Not sure when occurs.   Allergies  Allergen Reactions  . Aspirin Swelling  . Atorvastatin Other (See Comments)    Leg pain, feet pain  . Cephalexin Other (See Comments)    unknown    Current Outpatient Prescriptions  Medication Sig Dispense Refill  . amLODipine (NORVASC) 5 MG tablet TAKE 1 TABLET (5 MG TOTAL) BY MOUTH DAILY. 30 tablet 2  . clopidogrel (PLAVIX) 75 MG tablet TAKE 1 TABLET BY MOUTH DAILY 30 tablet 1  . CRESTOR 10 MG tablet TAKE 1/2 TABLETS (5 MG TOTAL) BY MOUTH DAILY. 30 tablet 0  . fish oil-omega-3 fatty acids 1000 MG capsule Take 1 g by mouth daily.      . furosemide (LASIX) 20 MG tablet TAKE 1 TABLET (20 MG TOTAL) BY MOUTH DAILY. 30 tablet 11  . isosorbide mononitrate (IMDUR) 30 MG 24 hr tablet TAKE 1 TABLET BY MOUTH ONCE A DAY 30 tablet 3  . potassium chloride SA (K-DUR,KLOR-CON) 20 MEQ tablet TAKE 1 TABLET (20 MEQ TOTAL) BY MOUTH DAILY. 30 tablet 5  . tamsulosin (FLOMAX) 0.4 MG CAPS capsule Take 1 capsule (0.4 mg  total) by mouth daily. 30 capsule 3   No current facility-administered medications for this visit.    Past Medical History  Diagnosis Date  . HTN (hypertension)     Last echo 1/07:  Normal LVF, LV thickness upper limits of normal, mild aortic root dilatation, mild to mod MAC, mild LAE, borderline RVH, mild RAE.  . Tobacco abuse   . Ethanolism   . COPD (chronic obstructive pulmonary disease)   . Unilateral congenital absence of kidney   . CAD (coronary artery disease)     s/p stent to OM2 in past;  Last LHC 3/05: EF 60%, left RA occluded, right RA ok, prox to mid LAD 60%, oD1 75-80%, oD2 small 75%, pOM1 50%, OM2 stent ok with 50% before stent, mRCA 30%, dRCA 30%, PDA 70%.  Last dobutamine myoview 5/06: no scar or ischemia, EF 60%.  Marland Kitchen HLD (hyperlipidemia)   . PAD (peripheral artery disease)   . S/P AAA repair   . Ascending aortic aneurysm     Dr. Roxan Hockey  . DJD (degenerative joint disease)   . GERD (gastroesophageal reflux disease)     Past Surgical History  Procedure Laterality Date  . Appendectomy    . Hernia repair    .  Resection and grafting of infarenal  abdominal  aortic aneurysm  05/03/2002    Family History  Problem Relation Age of Onset  . Cancer Mother   . Cancer Sister   . COPD Sister   . COPD Brother   . Cancer Brother     Lip    History   Social History  . Marital Status: Widowed    Spouse Name: N/A    Number of Children: N/A  . Years of Education: N/A   Occupational History  . Not on file.   Social History Main Topics  . Smoking status: Former Smoker    Quit date: 05/06/1996  . Smokeless tobacco: Never Used  . Alcohol Use: Yes  . Drug Use: No  . Sexual Activity: Not on file   Other Topics Concern  . Not on file   Social History Narrative    Review of Systems:  All systems reviewed.  They are negative to the above problem except as previously stated.  Vital Signs: BP 126/76 mmHg  Pulse 48  Ht 5\' 7"  (1.702 m)  Wt 199 lb 6.4 oz  (90.447 kg)  BMI 31.22 kg/m2  Physical Exam Patient is in NAD HEENT:  Normocephalic, atraumatic. EOMI, PERRLA.  Neck: JVP is normal.  No bruits.  Lungs: clear to auscultation. No rales no wheezes.  Heart: Regular rate and rhythm. Normal S1, S2. No S3.   No significant murmurs. PMI not displaced.  Abdomen:  Supple, nontender. Normal bowel sounds. No masses. No hepatomegaly.  Extremities:   Good distal pulses throughout. tr lower extremity edema.  Musculoskeletal :moving all extremities.  Neuro:   alert and oriented x3.  CN II-XII grossly intact.  EKG  SB  48 bpm   Assessment and Plan:  1.  Bradycardia  HR remains stable  Spell at home concerning.  I am not convinced related to signif change in HR  But, if has recurrent spell would set up for monitor  2.  CAD  I am not convinced of active sympomtsoms  3.  HTN  Keep on same regimen  Adeq control    3.  HL  Will get lipids today

## 2014-05-02 NOTE — Patient Instructions (Signed)
Your physician wants you to follow-up in: 8 months with Dr. Theressa Stamps will receive a reminder letter in the mail two months in advance. If you don't receive a letter, please call our office to schedule the follow-up appointment.  Your physician recommends that you return for lab work today: BMP/BNP/CBC/TSH

## 2014-05-24 ENCOUNTER — Other Ambulatory Visit: Payer: Self-pay | Admitting: Internal Medicine

## 2014-06-14 ENCOUNTER — Other Ambulatory Visit: Payer: Self-pay | Admitting: Internal Medicine

## 2014-06-20 ENCOUNTER — Other Ambulatory Visit: Payer: Self-pay | Admitting: Family Medicine

## 2014-06-22 ENCOUNTER — Other Ambulatory Visit: Payer: Self-pay | Admitting: Family Medicine

## 2014-06-23 ENCOUNTER — Other Ambulatory Visit: Payer: Self-pay | Admitting: Family Medicine

## 2014-06-23 NOTE — Telephone Encounter (Signed)
Patient scheduled for follow up 06/29/14.

## 2014-06-29 ENCOUNTER — Encounter: Payer: Self-pay | Admitting: Family Medicine

## 2014-06-29 ENCOUNTER — Ambulatory Visit (INDEPENDENT_AMBULATORY_CARE_PROVIDER_SITE_OTHER): Payer: Medicare Other | Admitting: Family Medicine

## 2014-06-29 VITALS — BP 150/77 | HR 59 | Temp 96.3°F | Ht 67.0 in | Wt 202.2 lb

## 2014-06-29 DIAGNOSIS — I2510 Atherosclerotic heart disease of native coronary artery without angina pectoris: Secondary | ICD-10-CM

## 2014-06-29 DIAGNOSIS — I2584 Coronary atherosclerosis due to calcified coronary lesion: Secondary | ICD-10-CM

## 2014-06-29 DIAGNOSIS — R3989 Other symptoms and signs involving the genitourinary system: Secondary | ICD-10-CM

## 2014-06-29 DIAGNOSIS — I10 Essential (primary) hypertension: Secondary | ICD-10-CM | POA: Diagnosis not present

## 2014-06-29 MED ORDER — POTASSIUM CHLORIDE CRYS ER 20 MEQ PO TBCR: 20.0000 meq | EXTENDED_RELEASE_TABLET | Freq: Every day | ORAL | Status: DC

## 2014-06-29 MED ORDER — ROSUVASTATIN CALCIUM 10 MG PO TABS: 10.0000 mg | ORAL_TABLET | Freq: Every day | ORAL | Status: DC

## 2014-06-29 MED ORDER — AMLODIPINE BESYLATE 5 MG PO TABS: 5.0000 mg | ORAL_TABLET | Freq: Every day | ORAL | Status: DC

## 2014-06-29 MED ORDER — TAMSULOSIN HCL 0.4 MG PO CAPS: 0.4000 mg | ORAL_CAPSULE | Freq: Every day | ORAL | Status: DC

## 2014-06-29 MED ORDER — FUROSEMIDE 20 MG PO TABS: 20.0000 mg | ORAL_TABLET | Freq: Every day | ORAL | Status: DC

## 2014-06-29 NOTE — Progress Notes (Signed)
   Subjective:    Patient ID: Brad [name], male    DOB: [birth year], 81 y.o.   MRN: [id number]  HPI Patient is here for refills on meds.  He has hx of htn, hyperlipidemia, CAD, and bph.  Review of Systems  Constitutional: Negative for fever.  HENT: Negative for ear pain.   Eyes: Negative for discharge.  Respiratory: Negative for cough.   Cardiovascular: Negative for chest pain.  Gastrointestinal: Negative for abdominal distention.  Endocrine: Negative for polyuria.  Genitourinary: Negative for difficulty urinating.  Musculoskeletal: Negative for gait problem and neck pain.  Skin: Negative for color change and rash.  Neurological: Negative for speech difficulty and headaches.  Psychiatric/Behavioral: Negative for agitation.       Objective:    BP 150/77 mmHg  Pulse 59  Temp(Src) 96.3 F (35.7 C) (Oral)  Ht 5\' 7"  (1.702 m)  Wt 202 lb 3.2 oz (91.717 kg)  BMI 31.66 kg/m2 Physical Exam  Constitutional: He is oriented to person, place, and time. He appears well-developed and well-nourished.  HENT:  Head: Normocephalic and atraumatic.  Mouth/Throat: Oropharynx is clear and moist.  Eyes: Pupils are equal, round, and reactive to light.  Neck: Normal range of motion. Neck supple.  Cardiovascular: Normal rate and regular rhythm.   No murmur heard. Pulmonary/Chest: Effort normal and breath sounds normal.  Abdominal: Soft. Bowel sounds are normal. There is no tenderness.  Neurological: He is alert and oriented to person, place, and time.  Skin: Skin is warm and dry.  Psychiatric: He has a normal mood and affect.          Assessment & Plan:     ICD-9-CM ICD-10-CM   1. Urine troubles V47.4 R39.89 tamsulosin (FLOMAX) 0.4 MG CAPS capsule  2. Essential hypertension 401.9 I10 amLODipine (NORVASC) 5 MG tablet     rosuvastatin (CRESTOR) 10 MG tablet     furosemide (LASIX) 20 MG tablet     potassium chloride SA (K-DUR,KLOR-CON) 20 MEQ tablet  3. Coronary artery disease due to  calcified coronary lesion 414.00 I25.10 amLODipine (NORVASC) 5 MG tablet   414.4 I25.84      Return in about 3 months (around 09/27/2014).  Lysbeth Penner FNP

## 2014-07-06 DIAGNOSIS — H5052 Exophoria: Secondary | ICD-10-CM | POA: Diagnosis not present

## 2014-07-06 DIAGNOSIS — H3531 Nonexudative age-related macular degeneration: Secondary | ICD-10-CM | POA: Diagnosis not present

## 2014-08-08 ENCOUNTER — Telehealth: Payer: Self-pay | Admitting: Family Medicine

## 2014-08-09 ENCOUNTER — Ambulatory Visit (INDEPENDENT_AMBULATORY_CARE_PROVIDER_SITE_OTHER): Payer: Medicare Other | Admitting: Physician Assistant

## 2014-08-09 ENCOUNTER — Encounter: Payer: Self-pay | Admitting: Physician Assistant

## 2014-08-09 VITALS — BP 93/58 | HR 47 | Temp 96.9°F | Ht 67.0 in | Wt 202.0 lb

## 2014-08-09 DIAGNOSIS — I2584 Coronary atherosclerosis due to calcified coronary lesion: Secondary | ICD-10-CM

## 2014-08-09 DIAGNOSIS — I2510 Atherosclerotic heart disease of native coronary artery without angina pectoris: Secondary | ICD-10-CM | POA: Diagnosis not present

## 2014-08-09 DIAGNOSIS — L298 Other pruritus: Secondary | ICD-10-CM | POA: Diagnosis not present

## 2014-08-09 DIAGNOSIS — Z207 Contact with and (suspected) exposure to pediculosis, acariasis and other infestations: Secondary | ICD-10-CM

## 2014-08-09 DIAGNOSIS — Z2089 Contact with and (suspected) exposure to other communicable diseases: Secondary | ICD-10-CM

## 2014-08-09 MED ORDER — CETIRIZINE HCL 10 MG PO TABS
10.0000 mg | ORAL_TABLET | Freq: Two times a day (BID) | ORAL | Status: DC
Start: 2014-08-09 — End: 2014-11-24

## 2014-08-09 MED ORDER — TRIAMCINOLONE ACETONIDE 0.1 % EX CREA: 1.0000 "application " | TOPICAL_CREAM | Freq: Two times a day (BID) | CUTANEOUS | Status: DC

## 2014-08-09 MED ORDER — PERMETHRIN 5 % EX CREA: 1.0000 "application " | TOPICAL_CREAM | Freq: Once | CUTANEOUS | Status: DC

## 2014-08-09 NOTE — Patient Instructions (Signed)
Stop using current soap, lotion and washing detergent. Change to:   - Dove Soap for sensitive skin - ALL Free and Clear washing detergent - Aveeno lotion for dry skin or Aveeno eczema lotion  Apply Permethrin neck down. Leave on overnight. Wash off in morning. Repeat in 7 days. Wash sheets at the same time you apply cream. Wash clothes also before wearing again.   Use ice packs or lotion for itching episodes, do not scratch.   If Ceterizine ( Zyrtec) makes patient drowsy, cut pill in 1/2, take 1/2 in am and 1/2 in pm  Triamcinilone - 1 fingertip should be enough to cover 2 hands so apply accordingly  Return to clinic in 2-3 weeks for reassessment.

## 2014-08-09 NOTE — Progress Notes (Signed)
   Subjective:    Patient ID: Brad [name], male    DOB: Aug 24, 1927, 81 y.o.   MRN: [id number]  Rash   81 y/o male presents with pruritic rash that began 1 year ago. Began on his legs and is now on trunk. Has tried Benadryl cream with some relief. Has not visited a nursing facility since June 2015, however visited on a regular basis to see wife prior to that when symptoms occurred. Itch worse at night   Review of Systems  Respiratory: Negative.   Skin: Positive for color change (redness on anterior/posterior trunk and BLE, knee down) and rash (pruritic).       Objective:   Physical Exam  Constitutional: He appears well-developed and well-nourished. No distress.  Pulmonary/Chest: No respiratory distress.  Skin: Rash (negative for excoriations suggesting secondary bacterial infection . Patient scratched continuously while on exam table. ) noted. He is not diaphoretic. There is erythema (patches of erythema on anterior and posterior trunk, anterior forearms and BLE , knee down).  Nursing note and vitals reviewed.         Assessment & Plan:  1. Pruritic rash:   - Zyrtec 10mg  BID  - Triamcinolone Cream BID to AA until f/u   - Stop using current soap,Start using Dove Soap for sensitive skin  - Stop using current laundry detergent, begin using ALL free and clear  -Cool showers, ice packs to AA for itch instead of scratching.   2. Scabies  - Will treat with Elimite cream once, repeat in 7 days due to patient's exposure to nursing facility. Wash sheets and clothes in new detergent   Patient will f/u in 3 weeks for reassessment. If no improvement, will biopsy and r/o systemic drug reaction

## 2014-08-09 NOTE — Telephone Encounter (Signed)
Appt scheduled.  Patient and son aware.

## 2014-08-12 DIAGNOSIS — H3531 Nonexudative age-related macular degeneration: Secondary | ICD-10-CM | POA: Diagnosis not present

## 2014-08-30 ENCOUNTER — Other Ambulatory Visit: Payer: Self-pay | Admitting: Family Medicine

## 2014-09-19 ENCOUNTER — Encounter: Payer: Medicare Other | Admitting: Family Medicine

## 2014-09-19 NOTE — Progress Notes (Deleted)
Subjective:  Patient ID: Brad [name], male    DOB: Jul 27, 1927  Age: 81 y.o. MRN: [id number]  CC: No chief complaint on file.   HPI Brad [name] presents for  follow-up of hypertension. Patient has no history of headache chest pain or shortness of breath or recent cough. Patient also denies symptoms of TIA such as numbness weakness lateralizing. Patient checks  blood pressure at home and has not had any elevated readings recently. Patient denies side effects from his medication. States taking it regularly.   History Klein has a past medical history of HTN (hypertension); Tobacco abuse; Ethanolism; COPD (chronic obstructive pulmonary disease); Unilateral congenital absence of kidney; CAD (coronary artery disease); HLD (hyperlipidemia); PAD (peripheral artery disease); S/P AAA repair; Ascending aortic aneurysm; DJD (degenerative joint disease); and GERD (gastroesophageal reflux disease).   He has past surgical history that includes Appendectomy; Hernia repair; and RESECTION AND GRAFTING OF INFARENAL  ABDOMINAL  AORTIC ANEURYSM (05/03/2002).   His family history includes COPD in his brother and sister; Cancer in his brother, mother, and sister.He reports that he quit smoking about 18 years ago. He has never used smokeless tobacco. He reports that he drinks alcohol. He reports that he does not use illicit drugs.  Current Outpatient Prescriptions on File Prior to Visit  Medication Sig Dispense Refill  . amLODipine (NORVASC) 5 MG tablet Take 1 tablet (5 mg total) by mouth daily. 30 tablet 11  . betamethasone valerate (VALISONE) 0.1 % cream Apply topically 2 (two) times daily. 45 g 1  . cetirizine (ZYRTEC) 10 MG tablet Take 1 tablet (10 mg total) by mouth 2 (two) times daily. 60 tablet 11  . clopidogrel (PLAVIX) 75 MG tablet TAKE 1 TABLET BY MOUTH DAILY 30 tablet 11  . fish oil-omega-3 fatty acids 1000 MG capsule Take 1 g by mouth daily.      . furosemide (LASIX) 20 MG tablet Take 1 tablet (20  mg total) by mouth daily. 30 tablet 11  . isosorbide mononitrate (IMDUR) 30 MG 24 hr tablet TAKE 1 TABLET BY MOUTH ONCE A DAY 30 tablet 2  . permethrin (ELIMITE) 5 % cream Apply 1 application topically once. Apply neck down. Leave on overnight. Wash in morning. Repeat in 7 days 60 g 1  . potassium chloride SA (K-DUR,KLOR-CON) 20 MEQ tablet Take 1 tablet (20 mEq total) by mouth daily. 30 tablet 11  . rosuvastatin (CRESTOR) 10 MG tablet Take 1 tablet (10 mg total) by mouth daily. 30 tablet 11  . tamsulosin (FLOMAX) 0.4 MG CAPS capsule Take 1 capsule (0.4 mg total) by mouth daily. 30 capsule 11  . triamcinolone cream (KENALOG) 0.1 % Apply 1 application topically 2 (two) times daily. Apply to AA of trunk, arms, legs. Avoid underarms, genitals, face, groin. 453.6 g 3   No current facility-administered medications on file prior to visit.    ROS Review of Systems  Constitutional: Negative for fever, chills, diaphoresis and unexpected weight change.  HENT: Negative for congestion, hearing loss, rhinorrhea, sore throat and trouble swallowing.   Respiratory: Negative for cough, chest tightness, shortness of breath and wheezing.   Gastrointestinal: Negative for nausea, vomiting, abdominal pain, diarrhea, constipation and abdominal distention.  Endocrine: Negative for cold intolerance and heat intolerance.  Genitourinary: Negative for dysuria, hematuria and flank pain.  Musculoskeletal: Negative for joint swelling and arthralgias.  Skin: Negative for rash.  Neurological: Negative for dizziness and headaches.  Psychiatric/Behavioral: Negative for dysphoric mood, decreased concentration and agitation. The patient is  not nervous/anxious.     Objective:  There were no vitals taken for this visit.  BP Readings from Last 3 Encounters:  08/09/14 93/58  06/29/14 150/77  05/02/14 126/76    Wt Readings from Last 3 Encounters:  08/09/14 202 lb (91.627 kg)  06/29/14 202 lb 3.2 oz (91.717 kg)  05/02/14  199 lb 6.4 oz (90.447 kg)     Physical Exam  Constitutional: He is oriented to person, place, and time. He appears well-developed and well-nourished. No distress.  HENT:  Head: Normocephalic and atraumatic.  Right Ear: External ear normal.  Left Ear: External ear normal.  Nose: Nose normal.  Mouth/Throat: Oropharynx is clear and moist.  Eyes: Conjunctivae and EOM are normal. Pupils are equal, round, and reactive to light.  Neck: Normal range of motion. Neck supple. No thyromegaly present.  Cardiovascular: Normal rate, regular rhythm and normal heart sounds.   No murmur heard. Pulmonary/Chest: Effort normal and breath sounds normal. No respiratory distress. He has no wheezes. He has no rales.  Abdominal: Soft. Bowel sounds are normal. He exhibits no distension. There is no tenderness.  Lymphadenopathy:    He has no cervical adenopathy.  Neurological: He is alert and oriented to person, place, and time. He has normal reflexes.  Skin: Skin is warm and dry.  Psychiatric: He has a normal mood and affect. His behavior is normal. Judgment and thought content normal.    No results found for: HGBA1C  Lab Results  Component Value Date   WBC 6.4 05/02/2014   HGB 13.9 05/02/2014   HCT 42.4 05/02/2014   PLT 183.0 05/02/2014   GLUCOSE 102* 05/02/2014   CHOL 125 05/03/2013   TRIG 99.0 05/03/2013   HDL 40.10 05/03/2013   LDLCALC 65 05/03/2013   ALT 21 05/03/2013   AST 21 05/03/2013   NA 138 05/02/2014   K 3.9 05/02/2014   CL 105 05/02/2014   CREATININE 0.6 05/02/2014   BUN 15 05/02/2014   CO2 28 05/02/2014   TSH 0.49 05/02/2014    Ct Angio Chest Aorta W/cm &/or Wo/cm  04/12/2014   CLINICAL DATA:  81 year old male with history of infrarenal abdominal aortic aneurysm repaired in 2013. Annual followup evaluation of a known ascending thoracic aortic aneurysm.  EXAM: CT ANGIOGRAPHY CHEST WITH CONTRAST  TECHNIQUE: Multidetector CT imaging of the chest was performed using the standard  protocol during bolus administration of intravenous contrast. Multiplanar CT image reconstructions and MIPs were obtained to evaluate the vascular anatomy.  CONTRAST:  46mL OMNIPAQUE IOHEXOL 350 MG/ML SOLN  COMPARISON:  Prior CTA of the chest 04/06/2013 and 03/31/2012  FINDINGS: Mediastinum: Unremarkable CT appearance of the thyroid gland. No suspicious mediastinal or hilar adenopathy. No soft tissue mediastinal mass. The thoracic esophagus is unremarkable.  Heart/Vascular: Stable fusiform aneurysmal dilatation of the ascending thoracic aorta with a maximal diameter of 4.6 cm measured in the proximal tubular portion and again at the level of the pulmonary trunk. The aneurysmal dilatation extends into the proximal aortic arch or the aorta measures 3.6 cm in transverse diameter. The distal arch and descending thoracic aorta are mildly ectatic without significant interval change. There is a small penetrating atherosclerotic ulcer arising from the lateral aspect of the aortic isthmus (image 37 series 4). The penetrating ulcer measures approximately 9 x 5 mm and is unchanged across multiple studies dating back to at least 2012. There are scattered atherosclerotic vascular calcifications without focal stenosis. 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 This  is unchanged compared to prior. The arch vessels are highly tortuous.  The left atrial appendage is well opacified. There is no internal thrombus. Borderline cardiomegaly with by atrial enlargement. Extensive atherosclerotic vascular calcifications present throughout the coronary arteries. No pericardial effusion. The bronchial artery share a common trunk which arises from the proximal descending thoracic aorta. The arteries are mildly prominent.  Lungs/Pleura: No pleural effusion. Background of moderately severe centrilobular emphysema. Stable partially calcified 1 cm ovoid nodule within the middle fissure. Additional small scattered  subpleural nodules/lymph nodes are also unchanged compared to prior imaging. Greater than 2 year stability is consistent with a benign process. No suspicious pulmonary mass or nodule. Calcified granuloma in the left lower lobe remains unchanged. No focal airspace consolidation or evidence of a pulmonary edema.  Bones/Soft Tissues: No acute fracture or aggressive appearing lytic or blastic osseous lesion. T6 hemangioma.  Upper Abdomen: Visualized upper abdominal organs are unremarkable.  Review of the MIP images confirms the above findings.  IMPRESSION: VASCULAR  1. Stable fusiform aneurysmal dilatation of the ascending thoracic aorta with a maximal diameter of 4.6 cm. 2. Continued stability of a 9 x 5 mm penetrating atherosclerotic ulcer arising from the region of the aortic isthmus. This finding remains unchanged dating back to at least 2012. 3. 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 4. Extensive atherosclerosis including multivessel coronary artery disease. 5. Cardiomegaly with biatrial enlargement. NON VASCULAR  1. Moderately severe centrilobular emphysema. 2. Additional ancillary findings as above without significant interval change. Signed,  Criselda Peaches, MD  [facility]  [facility]   Electronically Signed   By: Jacqulynn Cadet M.D.   On: 04/12/2014 14:10    Assessment & Plan:   There are no diagnoses linked to this encounter. I am having Mr. Cauthon maintain his fish oil-omega-3 fatty acids, betamethasone valerate, clopidogrel, isosorbide mononitrate, amLODipine, rosuvastatin, furosemide, potassium chloride SA, tamsulosin, triamcinolone cream, permethrin, and cetirizine.  No orders of the defined types were placed in this encounter.     Follow-up: No Follow-up on file.  [name], M.D.

## 2014-09-20 NOTE — Progress Notes (Signed)
erroneous

## 2014-09-22 ENCOUNTER — Other Ambulatory Visit: Payer: Self-pay | Admitting: Family Medicine

## 2014-09-23 DIAGNOSIS — K432 Incisional hernia without obstruction or gangrene: Secondary | ICD-10-CM | POA: Diagnosis not present

## 2014-09-27 ENCOUNTER — Other Ambulatory Visit: Payer: Self-pay | Admitting: Family Medicine

## 2014-09-28 ENCOUNTER — Encounter: Payer: Self-pay | Admitting: Family Medicine

## 2014-09-28 ENCOUNTER — Ambulatory Visit (INDEPENDENT_AMBULATORY_CARE_PROVIDER_SITE_OTHER): Payer: Medicare Other | Admitting: Family Medicine

## 2014-09-28 VITALS — BP 117/65 | HR 46 | Temp 97.3°F | Ht 66.0 in | Wt 201.0 lb

## 2014-09-28 DIAGNOSIS — I10 Essential (primary) hypertension: Secondary | ICD-10-CM

## 2014-09-28 DIAGNOSIS — I2584 Coronary atherosclerosis due to calcified coronary lesion: Secondary | ICD-10-CM | POA: Diagnosis not present

## 2014-09-28 DIAGNOSIS — L409 Psoriasis, unspecified: Secondary | ICD-10-CM

## 2014-09-28 DIAGNOSIS — I2510 Atherosclerotic heart disease of native coronary artery without angina pectoris: Secondary | ICD-10-CM | POA: Diagnosis not present

## 2014-09-28 DIAGNOSIS — N400 Enlarged prostate without lower urinary tract symptoms: Secondary | ICD-10-CM | POA: Diagnosis not present

## 2014-09-28 DIAGNOSIS — E785 Hyperlipidemia, unspecified: Secondary | ICD-10-CM

## 2014-09-28 MED ORDER — CLOBETASOL PROPIONATE 0.05 % EX FOAM: Freq: Two times a day (BID) | CUTANEOUS | Status: DC

## 2014-09-28 NOTE — Progress Notes (Signed)
Subjective:  Patient ID: Brad [name], male    DOB: 1927-08-24  Age: 81 y.o. MRN: [id number]  CC: Hypertension; Hyperlipidemia; and Benign Prostatic Hypertrophy   HPI LEANDREW KEECH presents for follow-up of hypertension. Patient has no history of headache chest pain or shortness of breath or recent cough. Patient also denies symptoms of TIA such as numbness weakness lateralizing. Patient checks  blood pressure at home and has not had any elevated readings recently. Patient denies side effects from his medication. States taking it regularly.   Patient in for follow-up of elevated cholesterol. Doing well without complaints on current medication. Denies side effects of statin including myalgia and arthralgia and nausea. Also in today for liver function testing. Currently no chest pain, shortness of breath or other cardiovascular related symptoms noted.  Rash in the scalp that needs to be evaluated. Patient apparently has had psoriasis in the past.  History Sadiq has a past medical history of HTN (hypertension); Tobacco abuse; Ethanolism; COPD (chronic obstructive pulmonary disease); Unilateral congenital absence of kidney; CAD (coronary artery disease); HLD (hyperlipidemia); PAD (peripheral artery disease); S/P AAA repair; Ascending aortic aneurysm; DJD (degenerative joint disease); and GERD (gastroesophageal reflux disease).   He has past surgical history that includes Appendectomy; Hernia repair; and RESECTION AND GRAFTING OF INFARENAL  ABDOMINAL  AORTIC ANEURYSM (05/03/2002).   His family history includes COPD in his brother and sister; Cancer in his brother, mother, and sister.He reports that he quit smoking about 18 years ago. He has never used smokeless tobacco. He reports that he drinks alcohol. He reports that he does not use illicit drugs.  Current Outpatient Prescriptions on File Prior to Visit  Medication Sig Dispense Refill  . amLODipine (NORVASC) 5 MG tablet Take 1 tablet (5 mg  total) by mouth daily. 30 tablet 11  . cetirizine (ZYRTEC) 10 MG tablet Take 1 tablet (10 mg total) by mouth 2 (two) times daily. 60 tablet 11  . clopidogrel (PLAVIX) 75 MG tablet TAKE 1 TABLET BY MOUTH DAILY 30 tablet 11  . fish oil-omega-3 fatty acids 1000 MG capsule Take 1 g by mouth daily.      . furosemide (LASIX) 20 MG tablet Take 1 tablet (20 mg total) by mouth daily. 30 tablet 11  . isosorbide mononitrate (IMDUR) 30 MG 24 hr tablet TAKE 1 TABLET BY MOUTH ONCE A DAY 30 tablet 2  . potassium chloride SA (K-DUR,KLOR-CON) 20 MEQ tablet Take 1 tablet (20 mEq total) by mouth daily. 30 tablet 11  . rosuvastatin (CRESTOR) 10 MG tablet Take 1 tablet (10 mg total) by mouth daily. 30 tablet 11  . tamsulosin (FLOMAX) 0.4 MG CAPS capsule Take 1 capsule (0.4 mg total) by mouth daily. 30 capsule 11  . triamcinolone cream (KENALOG) 0.1 % Apply 1 application topically 2 (two) times daily. Apply to AA of trunk, arms, legs. Avoid underarms, genitals, face, groin. 453.6 g 3  . permethrin (ELIMITE) 5 % cream Apply 1 application topically once. Apply neck down. Leave on overnight. Wash in morning. Repeat in 7 days (Patient not taking: Reported on 09/28/2014) 60 g 1   No current facility-administered medications on file prior to visit.    ROS Review of Systems  Constitutional: Negative for fever, chills and diaphoresis.  HENT: Negative for congestion, rhinorrhea and sore throat.   Respiratory: Negative for cough, shortness of breath and wheezing.   Cardiovascular: Negative for chest pain.  Gastrointestinal: Negative for nausea, vomiting, abdominal pain, diarrhea, constipation and abdominal distention.  Genitourinary: Positive for frequency. Negative for dysuria.       Has history of BPH and is up 2 times a night to go to the bathroom and has to go to the restroom as soon as he gets up in the morning. He is followed by urology for this. Satisfied with treatment currently  Musculoskeletal: Negative for joint  swelling and arthralgias.  Skin: Negative for rash.  Neurological: Negative for headaches.    Objective:  BP 117/65 mmHg  Pulse 46  Temp(Src) 97.3 F (36.3 C) (Oral)  Ht 5' 6"  (1.676 m)  Wt 201 lb (91.173 kg)  BMI 32.46 kg/m2  BP Readings from Last 3 Encounters:  09/28/14 117/65  08/09/14 93/58  06/29/14 150/77    Wt Readings from Last 3 Encounters:  09/28/14 201 lb (91.173 kg)  08/09/14 202 lb (91.627 kg)  06/29/14 202 lb 3.2 oz (91.717 kg)     Physical Exam  Constitutional: He is oriented to person, place, and time. He appears well-developed and well-nourished. No distress.  HENT:  Head: Normocephalic and atraumatic.  Right Ear: External ear normal.  Left Ear: External ear normal.  Nose: Nose normal.  Mouth/Throat: Oropharynx is clear and moist.  Eyes: Conjunctivae and EOM are normal. Pupils are equal, round, and reactive to light.  Neck: Normal range of motion. Neck supple. No thyromegaly present.  Cardiovascular: Normal rate, regular rhythm and normal heart sounds.   No murmur heard. Pulmonary/Chest: Effort normal and breath sounds normal. No respiratory distress. He has no wheezes. He has no rales.  Abdominal: Soft. Bowel sounds are normal. He exhibits no distension. There is no tenderness.  Lymphadenopathy:    He has no cervical adenopathy.  Neurological: He is alert and oriented to person, place, and time. He has normal reflexes.  Skin: Skin is warm and dry. Rash (Silvery thick scaled eruption covering most of the occiput of the scalp) noted.  Psychiatric: He has a normal mood and affect. His behavior is normal. Judgment and thought content normal.    No results found for: HGBA1C  Lab Results  Component Value Date   WBC 6.4 05/02/2014   HGB 13.9 05/02/2014   HCT 42.4 05/02/2014   PLT 183.0 05/02/2014   GLUCOSE 102* 05/02/2014   CHOL 125 05/03/2013   TRIG 99.0 05/03/2013   HDL 40.10 05/03/2013   LDLCALC 65 05/03/2013   ALT 21 05/03/2013   AST 21  05/03/2013   NA 138 05/02/2014   K 3.9 05/02/2014   CL 105 05/02/2014   CREATININE 0.6 05/02/2014   BUN 15 05/02/2014   CO2 28 05/02/2014   TSH 0.49 05/02/2014    Ct Angio Chest Aorta W/cm &/or Wo/cm  04/12/2014   CLINICAL DATA:  81 year old male with history of infrarenal abdominal aortic aneurysm repaired in 2013. Annual followup evaluation of a known ascending thoracic aortic aneurysm.  EXAM: CT ANGIOGRAPHY CHEST WITH CONTRAST  TECHNIQUE: Multidetector CT imaging of the chest was performed using the standard protocol during bolus administration of intravenous contrast. Multiplanar CT image reconstructions and MIPs were obtained to evaluate the vascular anatomy.  CONTRAST:  1m OMNIPAQUE IOHEXOL 350 MG/ML SOLN  COMPARISON:  Prior CTA of the chest 04/06/2013 and 03/31/2012  FINDINGS: Mediastinum: Unremarkable CT appearance of the thyroid gland. No suspicious mediastinal or hilar adenopathy. No soft tissue mediastinal mass. The thoracic esophagus is unremarkable.  Heart/Vascular: Stable fusiform aneurysmal dilatation of the ascending thoracic aorta with a maximal diameter of 4.6 cm measured in the proximal tubular  portion and again at the level of the pulmonary trunk. The aneurysmal dilatation extends into the proximal aortic arch or the aorta measures 3.6 cm in transverse diameter. The distal arch and descending thoracic aorta are mildly ectatic without significant interval change. There is a small penetrating atherosclerotic ulcer arising from the lateral aspect of the aortic isthmus (image 37 series 4). The penetrating ulcer measures approximately 9 x 5 mm and is unchanged across multiple studies dating back to at least 2012. There are scattered atherosclerotic vascular calcifications without focal stenosis. 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 This is unchanged compared to prior. The arch vessels are highly tortuous.  The left atrial appendage is well opacified. There  is no internal thrombus. Borderline cardiomegaly with by atrial enlargement. Extensive atherosclerotic vascular calcifications present throughout the coronary arteries. No pericardial effusion. The bronchial artery share a common trunk which arises from the proximal descending thoracic aorta. The arteries are mildly prominent.  Lungs/Pleura: No pleural effusion. Background of moderately severe centrilobular emphysema. Stable partially calcified 1 cm ovoid nodule within the middle fissure. Additional small scattered subpleural nodules/lymph nodes are also unchanged compared to prior imaging. Greater than 2 year stability is consistent with a benign process. No suspicious pulmonary mass or nodule. Calcified granuloma in the left lower lobe remains unchanged. No focal airspace consolidation or evidence of a pulmonary edema.  Bones/Soft Tissues: No acute fracture or aggressive appearing lytic or blastic osseous lesion. T6 hemangioma.  Upper Abdomen: Visualized upper abdominal organs are unremarkable.  Review of the MIP images confirms the above findings.  IMPRESSION: VASCULAR  1. Stable fusiform aneurysmal dilatation of the ascending thoracic aorta with a maximal diameter of 4.6 cm. 2. Continued stability of a 9 x 5 mm penetrating atherosclerotic ulcer arising from the region of the aortic isthmus. This finding remains unchanged dating back to at least 2012. 3. 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 4. Extensive atherosclerosis including multivessel coronary artery disease. 5. Cardiomegaly with biatrial enlargement. NON VASCULAR  1. Moderately severe centrilobular emphysema. 2. Additional ancillary findings as above without significant interval change. Signed,  Criselda Peaches, MD  [facility]  [facility]   Electronically Signed   By: Jacqulynn Cadet M.D.   On: 04/12/2014 14:10    Assessment & Plan:   Nakia  was seen today for hypertension, hyperlipidemia and benign prostatic hypertrophy.  Diagnoses and all orders for this visit:  Essential hypertension Orders: -     Cancel: POCT CBC -     CMP14+EGFR -     Lipid panel -     CBC with Differential/Platelet  Hyperlipemia Orders: -     Cancel: POCT CBC -     CMP14+EGFR -     Lipid panel  Psoriasis of scalp Orders: -     clobetasol (OLUX) 0.05 % topical foam; Apply topically 2 (two) times daily.  BPH (benign prostatic hyperplasia)   I have discontinued Mr. Marchuk's betamethasone valerate. I am also having him start on clobetasol. Additionally, I am having him maintain his fish oil-omega-3 fatty acids, clopidogrel, amLODipine, rosuvastatin, furosemide, potassium chloride SA, tamsulosin, triamcinolone cream, permethrin, cetirizine, and isosorbide mononitrate.  Meds ordered this encounter  Medications  . clobetasol (OLUX) 0.05 % topical foam    Sig: Apply topically 2 (two) times daily.    Dispense:  50 g    Refill:  5     Follow-up:  Return in about 3 months (around 12/29/2014).  [name], M.D.

## 2014-09-29 LAB — CMP14+EGFR
A/G RATIO: 2 (ref 1.1–2.5)
ALT: 9 IU/L (ref 0–44)
AST: 12 IU/L (ref 0–40)
Albumin: 4.2 g/dL (ref 3.5–4.7)
Alkaline Phosphatase: 43 IU/L (ref 39–117)
BILIRUBIN TOTAL: 0.5 mg/dL (ref 0.0–1.2)
BUN/Creatinine Ratio: 20 (ref 10–22)
BUN: 19 mg/dL (ref 8–27)
CO2: 25 mmol/L (ref 18–29)
Calcium: 9.1 mg/dL (ref 8.6–10.2)
Chloride: 104 mmol/L (ref 97–108)
Creatinine, Ser: 0.96 mg/dL (ref 0.76–1.27)
GFR, EST AFRICAN AMERICAN: 83 mL/min/{1.73_m2} (ref >59)
GFR, EST NON AFRICAN AMERICAN: 72 mL/min/{1.73_m2} (ref >59)
GLUCOSE: 105 mg/dL — AB (ref 65–99)
Globulin, Total: 2.1 g/dL (ref 1.5–4.5)
Potassium: 4.8 mmol/L (ref 3.5–5.2)
Sodium: 144 mmol/L (ref 134–144)
TOTAL PROTEIN: 6.3 g/dL (ref 6.0–8.5)

## 2014-09-29 LAB — LIPID PANEL
CHOL/HDL RATIO: 3 ratio (ref 0.0–5.0)
Cholesterol, Total: 141 mg/dL (ref 100–199)
HDL: 47 mg/dL (ref >39)
LDL Calculated: 78 mg/dL (ref 0–99)
TRIGLYCERIDES: 78 mg/dL (ref 0–149)
VLDL CHOLESTEROL CAL: 16 mg/dL (ref 5–40)

## 2014-09-29 LAB — CBC WITH DIFFERENTIAL/PLATELET
Basophils Absolute: 0 10*3/uL (ref 0.0–0.2)
Basos: 0 %
EOS (ABSOLUTE): 0.4 10*3/uL (ref 0.0–0.4)
Eos: 7 %
Hematocrit: 38.7 % (ref 37.5–51.0)
Hemoglobin: 13.3 g/dL (ref 12.6–17.7)
IMMATURE GRANS (ABS): 0 10*3/uL (ref 0.0–0.1)
Immature Granulocytes: 0 %
Lymphocytes Absolute: 1.9 10*3/uL (ref 0.7–3.1)
Lymphs: 30 %
MCH: 32 pg (ref 26.6–33.0)
MCHC: 34.4 g/dL (ref 31.5–35.7)
MCV: 93 fL (ref 79–97)
Monocytes Absolute: 0.8 10*3/uL (ref 0.1–0.9)
Monocytes: 13 %
NEUTROS PCT: 50 %
Neutrophils Absolute: 3 10*3/uL (ref 1.4–7.0)
PLATELETS: 173 10*3/uL (ref 150–379)
RBC: 4.15 x10E6/uL (ref 4.14–5.80)
RDW: 13.6 % (ref 12.3–15.4)
WBC: 6.1 10*3/uL (ref 3.4–10.8)

## 2014-10-05 ENCOUNTER — Emergency Department (HOSPITAL_COMMUNITY): Payer: Medicare Other

## 2014-10-05 ENCOUNTER — Emergency Department (HOSPITAL_COMMUNITY)
Admission: EM | Admit: 2014-10-05 | Discharge: 2014-10-05 | Disposition: A | Discharge status: Home / Self Care | Payer: Medicare Other | Attending: Emergency Medicine | Admitting: Emergency Medicine

## 2014-10-05 ENCOUNTER — Telehealth: Payer: Self-pay | Admitting: Family Medicine

## 2014-10-05 ENCOUNTER — Encounter (HOSPITAL_COMMUNITY): Payer: Self-pay | Admitting: Family Medicine

## 2014-10-05 DIAGNOSIS — M25511 Pain in right shoulder: Secondary | ICD-10-CM | POA: Insufficient documentation

## 2014-10-05 DIAGNOSIS — Z79899 Other long term (current) drug therapy: Secondary | ICD-10-CM | POA: Diagnosis not present

## 2014-10-05 DIAGNOSIS — Z7902 Long term (current) use of antithrombotics/antiplatelets: Secondary | ICD-10-CM | POA: Insufficient documentation

## 2014-10-05 DIAGNOSIS — M1990 Unspecified osteoarthritis, unspecified site: Secondary | ICD-10-CM | POA: Diagnosis not present

## 2014-10-05 DIAGNOSIS — R609 Edema, unspecified: Secondary | ICD-10-CM | POA: Insufficient documentation

## 2014-10-05 DIAGNOSIS — R079 Chest pain, unspecified: Secondary | ICD-10-CM | POA: Diagnosis not present

## 2014-10-05 DIAGNOSIS — L409 Psoriasis, unspecified: Secondary | ICD-10-CM | POA: Diagnosis not present

## 2014-10-05 DIAGNOSIS — Z8719 Personal history of other diseases of the digestive system: Secondary | ICD-10-CM | POA: Diagnosis not present

## 2014-10-05 DIAGNOSIS — E785 Hyperlipidemia, unspecified: Secondary | ICD-10-CM | POA: Diagnosis not present

## 2014-10-05 DIAGNOSIS — I2510 Atherosclerotic heart disease of native coronary artery without angina pectoris: Secondary | ICD-10-CM | POA: Diagnosis not present

## 2014-10-05 DIAGNOSIS — Q600 Renal agenesis, unilateral: Secondary | ICD-10-CM | POA: Diagnosis not present

## 2014-10-05 DIAGNOSIS — J449 Chronic obstructive pulmonary disease, unspecified: Secondary | ICD-10-CM | POA: Diagnosis not present

## 2014-10-05 DIAGNOSIS — M19011 Primary osteoarthritis, right shoulder: Secondary | ICD-10-CM | POA: Diagnosis not present

## 2014-10-05 DIAGNOSIS — I10 Essential (primary) hypertension: Secondary | ICD-10-CM | POA: Diagnosis not present

## 2014-10-05 DIAGNOSIS — Z7952 Long term (current) use of systemic steroids: Secondary | ICD-10-CM | POA: Diagnosis not present

## 2014-10-05 DIAGNOSIS — Z87891 Personal history of nicotine dependence: Secondary | ICD-10-CM | POA: Diagnosis not present

## 2014-10-05 LAB — CBC WITH DIFFERENTIAL/PLATELET
Basophils Absolute: 0 10*3/uL (ref 0.0–0.1)
Basophils Relative: 0 % (ref 0–1)
EOS ABS: 0.3 10*3/uL (ref 0.0–0.7)
EOS PCT: 5 % (ref 0–5)
HEMATOCRIT: 44.4 % (ref 39.0–52.0)
Hemoglobin: 14.6 g/dL (ref 13.0–17.0)
LYMPHS ABS: 1.7 10*3/uL (ref 0.7–4.0)
LYMPHS PCT: 27 % (ref 12–46)
MCH: 31.1 pg (ref 26.0–34.0)
MCHC: 32.9 g/dL (ref 30.0–36.0)
MCV: 94.5 fL (ref 78.0–100.0)
MONO ABS: 0.5 10*3/uL (ref 0.1–1.0)
MONOS PCT: 9 % (ref 3–12)
Neutro Abs: 3.8 10*3/uL (ref 1.7–7.7)
Neutrophils Relative %: 59 % (ref 43–77)
Platelets: 180 10*3/uL (ref 150–400)
RBC: 4.7 MIL/uL (ref 4.22–5.81)
RDW: 13.1 % (ref 11.5–15.5)
WBC: 6.3 10*3/uL (ref 4.0–10.5)

## 2014-10-05 LAB — I-STAT CHEM 8, ED
BUN: 18 mg/dL (ref 6–20)
CHLORIDE: 104 mmol/L (ref 101–111)
CREATININE: 0.7 mg/dL (ref 0.61–1.24)
Calcium, Ion: 1.21 mmol/L (ref 1.13–1.30)
Glucose, Bld: 107 mg/dL — ABNORMAL HIGH (ref 65–99)
HCT: 47 % (ref 39.0–52.0)
Hemoglobin: 16 g/dL (ref 13.0–17.0)
POTASSIUM: 4.3 mmol/L (ref 3.5–5.1)
Sodium: 140 mmol/L (ref 135–145)
TCO2: 24 mmol/L (ref 0–100)

## 2014-10-05 LAB — I-STAT TROPONIN, ED: TROPONIN I, POC: 0.01 ng/mL (ref 0.00–0.08)

## 2014-10-05 MED ORDER — LIDOCAINE 5 % EX PTCH: 1.0000 | MEDICATED_PATCH | CUTANEOUS | Status: DC

## 2014-10-05 NOTE — ED Notes (Signed)
Pt here for chest pain, right arm pain for a few months. sts for the first time last night it kept him awake.

## 2014-10-05 NOTE — ED Notes (Signed)
PA at bedside.

## 2014-10-05 NOTE — ED Provider Notes (Signed)
CSN: [id number]     Arrival date & time 10/05/14  9169 History   First MD Initiated Contact with Patient 10/05/14 1007     Chief Complaint  Patient presents with  . Chest Pain  . Arm Pain     (Consider location/radiation/quality/duration/timing/severity/associated sxs/prior Treatment) HPI Comments: Patient describes the pain in his arm as an electric shock type pain that started his shoulder and goes down to his fingers most prominently to his fourth digit  Patient is a 81 y.o. male presenting with arm pain. The history is provided by the patient and a relative.  Arm Pain This is a recurrent (Patient states that he's had issues with his arm in the past and more significantly over the last 3 weeks however a few days ago he went on a fishing trip and was casting his line multiple times and has noticed in the last 2 days the pain is much worse in ) problem. Episode onset: Has been bothering him for the last 3 weeks but worse in the last few days. The problem occurs constantly. The problem has been gradually worsening. Associated symptoms include chest pain. Pertinent negatives include no abdominal pain and no shortness of breath. Associated symptoms comments: The pain in the right shoulder that radiates down to the fingers was severe last night and he was unable to get rest. Noticed right-sided chest pain this morning as well. Denies any nausea, vomiting, fever, cough, shortness of breath.. Nothing aggravates the symptoms. Nothing relieves the symptoms. He has tried acetaminophen for the symptoms. The treatment provided no relief.    Past Medical History  Diagnosis Date  . HTN (hypertension)     Last echo 1/07:  Normal LVF, LV thickness upper limits of normal, mild aortic root dilatation, mild to mod MAC, mild LAE, borderline RVH, mild RAE.  . Tobacco abuse   . Ethanolism   . COPD (chronic obstructive pulmonary disease)   . Unilateral congenital absence of kidney   . CAD (coronary artery  disease)     s/p stent to OM2 in past;  Last LHC 3/05: EF 60%, left RA occluded, right RA ok, prox to mid LAD 60%, oD1 75-80%, oD2 small 75%, pOM1 50%, OM2 stent ok with 50% before stent, mRCA 30%, dRCA 30%, PDA 70%.  Last dobutamine myoview 5/06: no scar or ischemia, EF 60%.  Marland Kitchen HLD (hyperlipidemia)   . PAD (peripheral artery disease)   . S/P AAA repair   . Ascending aortic aneurysm     Dr. Roxan Hockey  . DJD (degenerative joint disease)   . GERD (gastroesophageal reflux disease)    Past Surgical History  Procedure Laterality Date  . Appendectomy    . Hernia repair    . Resection and grafting of infarenal  abdominal  aortic aneurysm  05/03/2002   Family History  Problem Relation Age of Onset  . Cancer Mother   . Cancer Sister   . COPD Sister   . COPD Brother   . Cancer Brother     Lip   History  Substance Use Topics  . Smoking status: Former Smoker    Quit date: 05/06/1996  . Smokeless tobacco: Never Used  . Alcohol Use: Yes    Review of Systems  Respiratory: Negative for shortness of breath.   Cardiovascular: Positive for chest pain.  Gastrointestinal: Negative for abdominal pain.  Neurological: Negative for weakness and numbness.  All other systems reviewed and are negative.     Allergies  Aspirin; Atorvastatin; and Cephalexin  Home Medications   Prior to Admission medications   Medication Sig Start Date End Date Taking? Authorizing Provider  amLODipine (NORVASC) 5 MG tablet Take 1 tablet (5 mg total) by mouth daily. 06/29/14   Lysbeth Penner, FNP  cetirizine (ZYRTEC) 10 MG tablet Take 1 tablet (10 mg total) by mouth 2 (two) times daily. 08/09/14   Tiffany A Gann, PA-C  clobetasol (OLUX) 0.05 % topical foam Apply topically 2 (two) times daily. 09/28/14   [name], MD  clopidogrel (PLAVIX) 75 MG tablet TAKE 1 TABLET BY MOUTH DAILY 05/25/14   Fay Records, MD  fish oil-omega-3 fatty acids 1000 MG capsule Take 1 g by mouth daily.      Historical Provider, MD    furosemide (LASIX) 20 MG tablet Take 1 tablet (20 mg total) by mouth daily. 06/29/14   Lysbeth Penner, FNP  isosorbide mononitrate (IMDUR) 30 MG 24 hr tablet TAKE 1 TABLET BY MOUTH ONCE A DAY 09/23/14   Tiffany A Gann, PA-C  permethrin (ELIMITE) 5 % cream Apply 1 application topically once. Apply neck down. Leave on overnight. Wash in morning. Repeat in 7 days Patient not taking: Reported on 09/28/2014 08/09/14   Tiffany A Gann, PA-C  potassium chloride SA (K-DUR,KLOR-CON) 20 MEQ tablet Take 1 tablet (20 mEq total) by mouth daily. 06/29/14   Lysbeth Penner, FNP  rosuvastatin (CRESTOR) 10 MG tablet Take 1 tablet (10 mg total) by mouth daily. 06/29/14   Lysbeth Penner, FNP  tamsulosin (FLOMAX) 0.4 MG CAPS capsule Take 1 capsule (0.4 mg total) by mouth daily. 06/29/14   Lysbeth Penner, FNP  triamcinolone cream (KENALOG) 0.1 % Apply 1 application topically 2 (two) times daily. Apply to AA of trunk, arms, legs. Avoid underarms, genitals, face, groin. 08/09/14   Tiffany A Gann, PA-C   BP 125/64 mmHg  Pulse 45  Temp(Src) 97.9 F (36.6 C)  Resp 18  SpO2 96% Physical Exam  Constitutional: He is oriented to person, place, and time. He appears well-developed and well-nourished. No distress.  HENT:  Head: Normocephalic and atraumatic.  Mouth/Throat: Oropharynx is clear and moist.  Eyes: Conjunctivae and EOM are normal. Pupils are equal, round, and reactive to light.  Neck: Normal range of motion. Neck supple.  Cardiovascular: Normal rate, regular rhythm and intact distal pulses.   No murmur heard. Pulmonary/Chest: Effort normal and breath sounds normal. No respiratory distress. He has no wheezes. He has no rales.  Abdominal: Soft. He exhibits no distension. There is no tenderness. There is no rebound and no guarding.  Musculoskeletal: Normal range of motion. He exhibits edema and tenderness.       Right shoulder: He exhibits tenderness and bony tenderness. He exhibits normal range of motion.        Arms: 1+ pitting edema bilateral lower extremities  Neurological: He is alert and oriented to person, place, and time.  Sensation and strength intact in the right upper extremity. 5 out of 5 muscle strength in the bicep, tricep and forearm muscles  Skin: Skin is warm and dry. Rash noted. No erythema.  Psoriasis present over the head and chest  Psychiatric: He has a normal mood and affect. His behavior is normal.  Nursing note and vitals reviewed.   ED Course  Procedures (including critical care time) Labs Review Labs Reviewed  I-STAT CHEM 8, ED - Abnormal; Notable for the following:    Glucose, Bld 107 (*)    All other components within normal limits  CBC WITH DIFFERENTIAL/PLATELET  Randolm Idol, ED    Imaging Review Dg Chest 2 View  10/05/2014   CLINICAL DATA:  Bradycardia. Chest pain. Symptom duration for 1 month. Smoker.  EXAM: CHEST  2 VIEW  COMPARISON:  Chest radiograph 10/27/2009.  Chest CT 04/12/2014.  FINDINGS: Prominence of the ascending aorta is redemonstrated. Atherosclerotic calcification of the transverse arch is redemonstrated; both stable.  Mild cardiac enlargement. Accentuation of the interstitial markings without focal infiltrates or failure hyperinflation. No effusion or pneumothorax. No osseous findings.  IMPRESSION: Stable chest.  COPD.  Aortic atherosclerosis.   Electronically Signed   By: Rolla Flatten M.D.   On: 10/05/2014 11:36   Dg Shoulder Right  10/05/2014   CLINICAL DATA:  Right shoulder pain.  EXAM: RIGHT SHOULDER - 2+ VIEW  COMPARISON:  None.  FINDINGS: No fracture or dislocation is identified. Moderate degenerative changes are seen involving the [facility] joint. Mild degenerative changes are seen involving the humeral head. No bony lesions or destruction. No soft tissue abnormalities.  IMPRESSION: Degenerative changes involving primarily the [facility] joint.   Electronically Signed   By: Aletta Edouard M.D.   On: 10/05/2014 11:35     EKG  Interpretation   Date/Time:  Wednesday Oct 05 2014 10:08:06 EDT Ventricular Rate:  42 PR Interval:    QRS Duration: 120 QT Interval:  424 QTC Calculation: 354 R Axis:   -64 Text Interpretation:  new  Junctional bradycardia in a pattern of bigeminy  Left axis deviation Pulmonary disease pattern Right bundle branch block  Reconfirmed by Maryan Rued  MD, Loree Fee ([id number]) on 10/05/2014 10:11:10 AM      MDM   Final diagnoses:  Chest pain    Patient with a significant history for coronary artery decreased status post stents, AAA status post repair, a descending aortic aneurysm who presents today with right shoulder, arm and chest pain. Patient states for the last 3 weeks he's had intermittent sharp pain in his arm but this morning was the first time he noticed going into his chest. He recently went fishing in the last few days where he is right-handed in cast his line multiple times. In the last few days the pain is gotten worse and last night the pain kept him awake. He takes Tylenol once a day around noon for the pain which is not significantly improved his pain this time.  He denies palpitations, shortness of breath, diaphoresis, nausea or vomiting. No weakness to the arm. No abdominal pain.  Pain with palpation of the right shoulder and into the chest. Pain is worse with rotation of the shoulder with popping noted.  Pain in the C7 to C8 distribution but he denies severe neck tenderness.  Low suspicion that patient's symptoms today are cardiac related. Feel most likely that this is musculoskeletal with possible nerve impingement. EKG shows a sinus bradycardia with bigeminy. CBC, i-STAT Chem-8 and troponin pending. Chest x-ray and right shoulder imaging pending  11:44 AM Labs without acute findings. Imaging was negative for the chest and on the right shoulder show degenerative joint disease. Troponin was negative. Patient will need to follow-up with orthopedics also he will call Dr. Harrington Challenger with  cardiology for follow-up appointment however at this time feel cardiac cause of the pain is very unlikely.  Blanchie Dessert, MD 10/05/14 1145

## 2014-10-05 NOTE — ED Notes (Signed)
Patient transported to X-ray without distress.  

## 2014-10-18 ENCOUNTER — Telehealth: Payer: Self-pay | Admitting: Family Medicine

## 2014-10-19 NOTE — Telephone Encounter (Signed)
Pt given appt with Dr.Stacks tomorrow at 12:10 for hospital follow up, requested to see Dr.Stacks.

## 2014-10-20 ENCOUNTER — Ambulatory Visit (INDEPENDENT_AMBULATORY_CARE_PROVIDER_SITE_OTHER): Payer: Medicare Other | Admitting: Family Medicine

## 2014-10-20 ENCOUNTER — Encounter: Payer: Self-pay | Admitting: Family Medicine

## 2014-10-20 VITALS — BP 109/64 | HR 58 | Temp 98.2°F | Ht 66.0 in | Wt 198.6 lb

## 2014-10-20 DIAGNOSIS — I2584 Coronary atherosclerosis due to calcified coronary lesion: Secondary | ICD-10-CM | POA: Diagnosis not present

## 2014-10-20 DIAGNOSIS — I2510 Atherosclerotic heart disease of native coronary artery without angina pectoris: Secondary | ICD-10-CM

## 2014-10-20 DIAGNOSIS — M19011 Primary osteoarthritis, right shoulder: Secondary | ICD-10-CM

## 2014-10-20 DIAGNOSIS — I10 Essential (primary) hypertension: Secondary | ICD-10-CM

## 2014-10-20 DIAGNOSIS — M129 Arthropathy, unspecified: Secondary | ICD-10-CM | POA: Diagnosis not present

## 2014-10-20 DIAGNOSIS — L409 Psoriasis, unspecified: Secondary | ICD-10-CM

## 2014-10-20 DIAGNOSIS — L259 Unspecified contact dermatitis, unspecified cause: Secondary | ICD-10-CM | POA: Diagnosis not present

## 2014-10-20 MED ORDER — CELECOXIB 200 MG PO CAPS: 200.0000 mg | ORAL_CAPSULE | Freq: Every day | ORAL | Status: DC

## 2014-10-20 MED ORDER — FLUOCINOLONE ACETONIDE 0.01 % EX SOLN: Freq: Two times a day (BID) | CUTANEOUS | Status: DC

## 2014-10-20 MED ORDER — BETAMETHASONE SOD PHOS & ACET 6 (3-3) MG/ML IJ SUSP
6.0000 mg | Freq: Once | INTRAMUSCULAR | Status: AC
Start: 2014-10-20 — End: 2014-10-20
  Administered 2014-10-20: 6 mg via INTRAMUSCULAR

## 2014-10-20 NOTE — Progress Notes (Signed)
Subjective:  Patient ID: Brad [name], male    DOB: January 06, 1928  Age: 81 y.o. MRN: [id number]  CC: Shoulder Pain   HPI Brad [name] presents for right shoulder pain. He presented to the emergency room for chest pain and shoulder pain a couple weeks ago. Shoulder x-ray was done showing some arthritis in the before meals joint as well as in the glenohumeral area. The physician who saw him noted that he had some tenderness above the before meals area as well. Patient says that he has decreased range of motion. There is mild to moderate painful range of motion. He had been trout fishing and then a lot of casting the day before he had the shoulder pain that sent him to the emergency room. His son is with him today and thinks that this was the likely cause of the pain after all. The shoulder pain is described as a dull ache with occasional sharpness to it. It is exacerbated by range of motion. However it is not specific for rotation versus abduction etc.  Patient states that they could not afford the Olux that was prescribed. He also has a new rash on his forearms that may be due to use of blue emu cream on the forearms.  History Brad [name] has a past medical history of HTN (hypertension); Tobacco abuse; Ethanolism; COPD (chronic obstructive pulmonary disease); Unilateral congenital absence of kidney; CAD (coronary artery disease); HLD (hyperlipidemia); PAD (peripheral artery disease); S/P AAA repair; Ascending aortic aneurysm; DJD (degenerative joint disease); and GERD (gastroesophageal reflux disease).   He has past surgical history that includes Appendectomy; Hernia repair; and RESECTION AND GRAFTING OF INFARENAL  ABDOMINAL  AORTIC ANEURYSM (05/03/2002).   His family history includes COPD in his brother and sister; Cancer in his brother, mother, and sister.He reports that he quit smoking about 18 years ago. He has never used smokeless tobacco. He reports that he drinks alcohol. He reports that he does  not use illicit drugs.  Outpatient Prescriptions Prior to Visit  Medication Sig Dispense Refill  . amLODipine (NORVASC) 5 MG tablet Take 1 tablet (5 mg total) by mouth daily. 30 tablet 11  . cetirizine (ZYRTEC) 10 MG tablet Take 1 tablet (10 mg total) by mouth 2 (two) times daily. (Patient taking differently: Take 5 mg by mouth 2 (two) times daily. ) 60 tablet 11  . clopidogrel (PLAVIX) 75 MG tablet TAKE 1 TABLET BY MOUTH DAILY 30 tablet 11  . fish oil-omega-3 fatty acids 1000 MG capsule Take 1 g by mouth daily.      . furosemide (LASIX) 20 MG tablet Take 1 tablet (20 mg total) by mouth daily. 30 tablet 11  . isosorbide mononitrate (IMDUR) 30 MG 24 hr tablet TAKE 1 TABLET BY MOUTH ONCE A DAY 30 tablet 2  . Multiple Vitamins-Minerals (PRESERVISION AREDS PO) Take 1 tablet by mouth daily.    . potassium chloride SA (K-DUR,KLOR-CON) 20 MEQ tablet Take 1 tablet (20 mEq total) by mouth daily. 30 tablet 11  . rosuvastatin (CRESTOR) 10 MG tablet Take 1 tablet (10 mg total) by mouth daily. (Patient taking differently: Take 5 mg by mouth daily. ) 30 tablet 11  . tamsulosin (FLOMAX) 0.4 MG CAPS capsule Take 1 capsule (0.4 mg total) by mouth daily. (Patient taking differently: Take 0.4 mg by mouth 2 (two) times daily. ) 30 capsule 11  . triamcinolone cream (KENALOG) 0.1 % Apply 1 application topically 2 (two) times daily. Apply to AA of trunk, arms, legs.  Avoid underarms, genitals, face, groin. 453.6 g 3  . TURMERIC CURCUMIN PO Take 1 tablet by mouth daily.    . clobetasol (OLUX) 0.05 % topical foam Apply topically 2 (two) times daily. (Patient not taking: Reported on 10/05/2014) 50 g 5  . clopidogrel (PLAVIX) 75 MG tablet Take 75 mg by mouth daily.    . isosorbide mononitrate (IMDUR) 30 MG 24 hr tablet Take 30 mg by mouth daily.    Marland Kitchen lidocaine (LIDODERM) 5 % Place 1 patch onto the skin daily. Remove & Discard patch within 12 hours or as directed by MD (Patient not taking: Reported on 10/20/2014) 30 patch 0  .  permethrin (ELIMITE) 5 % cream Apply 1 application topically once. Apply neck down. Leave on overnight. Wash in morning. Repeat in 7 days (Patient not taking: Reported on 09/28/2014) 60 g 1   No facility-administered medications prior to visit.    ROS Review of Systems  Constitutional: Negative for fever, chills and diaphoresis.  HENT: Negative for congestion, rhinorrhea and sore throat.   Respiratory: Negative for cough, shortness of breath and wheezing.   Cardiovascular: Positive for chest pain (presented to the hospital for chest pain a couple of weeks ago was ruled out for cardiacAnd symptoms have not recurred).  Gastrointestinal: Negative for nausea, vomiting, abdominal pain, diarrhea, constipation and abdominal distention.  Genitourinary: Negative for dysuria and frequency.  Musculoskeletal: Positive for arthralgias (still having right shoulder pain and pain in the hands.). Negative for joint swelling.  Skin: Positive for rash (forearms have a blotchy rash. They could not afford the Olux for the scalp and that rash continues as well.).  Neurological: Negative for headaches.    Objective:  BP 109/64 mmHg  Pulse 58  Temp(Src) 98.2 F (36.8 C) (Oral)  Ht 5\' 6"  (1.676 m)  Wt 198 lb 9.6 oz (90.084 kg)  BMI 32.07 kg/m2  BP Readings from Last 3 Encounters:  10/20/14 109/64  10/05/14 160/99  09/28/14 117/65    Wt Readings from Last 3 Encounters:  10/20/14 198 lb 9.6 oz (90.084 kg)  09/28/14 201 lb (91.173 kg)  08/09/14 202 lb (91.627 kg)     Physical Exam  Constitutional: He is oriented to person, place, and time. He appears well-developed and well-nourished. No distress.  HENT:  Head: Normocephalic and atraumatic.  Right Ear: External ear normal.  Left Ear: External ear normal.  Nose: Nose normal.  Mouth/Throat: Oropharynx is clear and moist.  Eyes: Conjunctivae and EOM are normal. Pupils are equal, round, and reactive to light.  Neck: Normal range of motion. Neck  supple. No thyromegaly present.  Cardiovascular: Normal rate, regular rhythm and normal heart sounds.   No murmur heard. Pulmonary/Chest: Effort normal and breath sounds normal. No respiratory distress. He has no wheezes. He has no rales.  Abdominal: Soft. Bowel sounds are normal. He exhibits no distension. There is no tenderness.  Lymphadenopathy:    He has no cervical adenopathy.  Neurological: He is alert and oriented to person, place, and time. He has normal reflexes.  Skin: Skin is warm and dry.  Psychiatric: He has a normal mood and affect. His behavior is normal. Judgment and thought content normal.    No results found for: HGBA1C  Lab Results  Component Value Date   WBC 6.3 10/05/2014   HGB 16.0 10/05/2014   HCT 47.0 10/05/2014   PLT 180 10/05/2014   GLUCOSE 107* 10/05/2014   CHOL 141 09/28/2014   TRIG 78 09/28/2014   HDL 47  09/28/2014   LDLCALC 78 09/28/2014   ALT 9 09/28/2014   AST 12 09/28/2014   NA 140 10/05/2014   K 4.3 10/05/2014   CL 104 10/05/2014   CREATININE 0.70 10/05/2014   BUN 18 10/05/2014   CO2 25 09/28/2014   TSH 0.49 05/02/2014    Dg Chest 2 View  10/05/2014   CLINICAL DATA:  Bradycardia. Chest pain. Symptom duration for 1 month. Smoker.  EXAM: CHEST  2 VIEW  COMPARISON:  Chest radiograph 10/27/2009.  Chest CT 04/12/2014.  FINDINGS: Prominence of the ascending aorta is redemonstrated. Atherosclerotic calcification of the transverse arch is redemonstrated; both stable.  Mild cardiac enlargement. Accentuation of the interstitial markings without focal infiltrates or failure hyperinflation. No effusion or pneumothorax. No osseous findings.  IMPRESSION: Stable chest.  COPD.  Aortic atherosclerosis.   Electronically Signed   By: Rolla Flatten M.D.   On: 10/05/2014 11:36   Dg Shoulder Right  10/05/2014   CLINICAL DATA:  Right shoulder pain.  EXAM: RIGHT SHOULDER - 2+ VIEW  COMPARISON:  None.  FINDINGS: No fracture or dislocation is identified. Moderate  degenerative changes are seen involving the [facility] joint. Mild degenerative changes are seen involving the humeral head. No bony lesions or destruction. No soft tissue abnormalities.  IMPRESSION: Degenerative changes involving primarily the [facility] joint.   Electronically Signed   By: Aletta Edouard M.D.   On: 10/05/2014 11:35    Assessment & Plan:   Brad [name] was seen today for shoulder pain.  Diagnoses and all orders for this visit:  Contact dermatitis Orders: -     betamethasone acetate-betamethasone sodium phosphate (CELESTONE) injection 6 mg; Inject 1 mL (6 mg total) into the muscle once.  Psoriasis of scalp  Essential hypertension  Arthritis of shoulder region, right  Other orders -     fluocinolone (SYNALAR) 0.01 % external solution; Apply topically 2 (two) times daily. For psoriasis -     celecoxib (CELEBREX) 200 MG capsule; Take 1 capsule (200 mg total) by mouth daily. With food. For joint pain  I have discontinued Brad [name]'s permethrin, clobetasol, and lidocaine. I am also having him start on fluocinolone and celecoxib. Additionally, I am having him maintain his fish oil-omega-3 fatty acids, clopidogrel, amLODipine, rosuvastatin, furosemide, potassium chloride SA, tamsulosin, triamcinolone cream, cetirizine, isosorbide mononitrate, TURMERIC CURCUMIN PO, and Multiple Vitamins-Minerals (PRESERVISION AREDS PO). We administered betamethasone acetate-betamethasone sodium phosphate.  Meds ordered this encounter  Medications  . fluocinolone (SYNALAR) 0.01 % external solution    Sig: Apply topically 2 (two) times daily. For psoriasis    Dispense:  90 mL    Refill:  11  . celecoxib (CELEBREX) 200 MG capsule    Sig: Take 1 capsule (200 mg total) by mouth daily. With food. For joint pain    Dispense:  30 capsule    Refill:  5  . betamethasone acetate-betamethasone sodium phosphate (CELESTONE) injection 6 mg    Sig:      Follow-up: Return in about 3 months (around 01/20/2015).  [name], M.D.

## 2014-10-31 ENCOUNTER — Telehealth: Payer: Self-pay | Admitting: Family Medicine

## 2014-11-02 NOTE — Telephone Encounter (Signed)
Spoke with pt's son regarding sxs Pt will schedule appt with ortho and will call if he has problems scheduling

## 2014-11-03 DIAGNOSIS — G5601 Carpal tunnel syndrome, right upper limb: Secondary | ICD-10-CM | POA: Diagnosis not present

## 2014-11-24 ENCOUNTER — Ambulatory Visit (INDEPENDENT_AMBULATORY_CARE_PROVIDER_SITE_OTHER): Payer: Medicare Other | Admitting: Physician Assistant

## 2014-11-24 ENCOUNTER — Encounter: Payer: Self-pay | Admitting: Physician Assistant

## 2014-11-24 VITALS — BP 106/54 | HR 43 | Temp 97.5°F | Ht 66.0 in | Wt 199.0 lb

## 2014-11-24 DIAGNOSIS — I2584 Coronary atherosclerosis due to calcified coronary lesion: Secondary | ICD-10-CM | POA: Diagnosis not present

## 2014-11-24 DIAGNOSIS — L309 Dermatitis, unspecified: Secondary | ICD-10-CM | POA: Diagnosis not present

## 2014-11-24 DIAGNOSIS — I2510 Atherosclerotic heart disease of native coronary artery without angina pectoris: Secondary | ICD-10-CM

## 2014-11-24 DIAGNOSIS — L219 Seborrheic dermatitis, unspecified: Secondary | ICD-10-CM | POA: Diagnosis not present

## 2014-11-24 DIAGNOSIS — L409 Psoriasis, unspecified: Secondary | ICD-10-CM

## 2014-11-24 DIAGNOSIS — L820 Inflamed seborrheic keratosis: Secondary | ICD-10-CM

## 2014-11-24 MED ORDER — KETOCONAZOLE 2 % EX SHAM: MEDICATED_SHAMPOO | CUTANEOUS | Status: DC

## 2014-11-24 MED ORDER — LORATADINE 10 MG PO TABS: 10.0000 mg | ORAL_TABLET | Freq: Every day | ORAL | Status: DC

## 2014-11-24 MED ORDER — CLOBETASOL PROPIONATE 0.05 % EX SOLN: CUTANEOUS | Status: DC

## 2014-11-24 NOTE — Progress Notes (Signed)
   Subjective:    Patient ID: Brad [name], male    DOB: 15-Jun-1927, 81 y.o.   MRN: [id number]  HPI 81 Y/O male presents with c/o lesions on arms and back that are itching and irritated by clothing, occasional bleeding. He has been using TAC   He also has psoriasis on his scalp. Has been using TGel shampoo.     Review of Systems  Skin:       Bumps on skin, itching, bleeds at times  Itching of scalp with build up        Objective:   Physical Exam  Constitutional: He is oriented to person, place, and time. He appears well-developed and well-nourished. No distress.  Musculoskeletal: Normal range of motion.  Neurological: He is alert and oriented to person, place, and time.  Skin: He is not diaphoretic.  Numerous seborrheic keratosis, flesh colored and hyperpigmented, on upper trunk. Verrucal skin tags on neck  Psychiatric: He has a normal mood and affect. His behavior is normal. Judgment and thought content normal.  Nursing note and vitals reviewed.         Assessment & Plan:  1. Scalp psoriasis  - ketoconazole (NIZORAL) 2 % shampoo; Use 2-3 times weekly. Leave on for 5 min. Rinse.  Dispense: 120 mL; Refill: 0 - clobetasol (TEMOVATE) 0.05 % external solution; Apply 2-3 drops to AA of scalp BID  Dispense: 50 mL; Refill: 4  2. Dermatitis  - loratadine (CLARITIN) 10 MG tablet; Take 1 tablet (10 mg total) by mouth daily.  Dispense: 30 tablet; Refill: 11  3. Seborrheic dermatitis   4. Inflamed seborrheic keratosis - 16 irritated and inflamed lesions treated with cryosurgery in office.   Dove soap    Continue all meds Labs pending Health Maintenance reviewed Diet and exercise encouraged RTO 3 weeks   Lucile Hillmann A. Benjamin Stain PA-C

## 2014-11-24 NOTE — Patient Instructions (Signed)
Alternate Ketoconazole shampoo with TSal every other day. Leave on for 5 min. To penetrate scalp. Rinse  Dove soap

## 2014-11-26 ENCOUNTER — Encounter: Payer: Self-pay | Admitting: *Deleted

## 2014-12-07 DIAGNOSIS — G5601 Carpal tunnel syndrome, right upper limb: Secondary | ICD-10-CM | POA: Diagnosis not present

## 2014-12-16 ENCOUNTER — Ambulatory Visit (INDEPENDENT_AMBULATORY_CARE_PROVIDER_SITE_OTHER): Payer: Medicare Other | Admitting: Physician Assistant

## 2014-12-16 ENCOUNTER — Encounter: Payer: Self-pay | Admitting: Physician Assistant

## 2014-12-16 VITALS — BP 125/69 | HR 54 | Temp 97.2°F | Ht 66.0 in | Wt 198.2 lb

## 2014-12-16 DIAGNOSIS — I2584 Coronary atherosclerosis due to calcified coronary lesion: Secondary | ICD-10-CM

## 2014-12-16 DIAGNOSIS — I2510 Atherosclerotic heart disease of native coronary artery without angina pectoris: Secondary | ICD-10-CM

## 2014-12-16 DIAGNOSIS — L298 Other pruritus: Secondary | ICD-10-CM

## 2014-12-16 DIAGNOSIS — L820 Inflamed seborrheic keratosis: Secondary | ICD-10-CM

## 2014-12-16 MED ORDER — TRIAMCINOLONE ACETONIDE 0.1 % EX CREA
1.0000 | TOPICAL_CREAM | Freq: Two times a day (BID) | CUTANEOUS | Status: DC
Start: 2014-12-16 — End: 2017-08-04

## 2014-12-16 NOTE — Progress Notes (Addendum)
   Subjective:    Patient ID: Brad [name], male    DOB: May 27, 1927, 81 y.o.   MRN: [id number]  HPI 81 y/o male presents for follow up of scalp psoriasis. He states that it is much improved with treatment of ketoconazole shampoo, clobetasol solution. He is also using TAC on areas of his body of psoriasis.   A few inflamed seb keratosis were treated with cryosurgery at last visit and have healed well. He has a lesion on his right neck that is irritated by clothing and at times bleeds.     Review of Systems  Constitutional: Negative.   HENT: Negative.   Eyes: Negative.   Respiratory: Negative.   Cardiovascular: Negative.   Gastrointestinal: Negative.   Skin:       Improved psoriasis on scalp Inflamed seb keratosis on right neck line       Objective:   Physical Exam  Constitutional: He appears well-developed and well-nourished.  Skin:  Inflamed seborrheic keratosis on right neckline Decreased scale on scalp  Psychiatric: He has a normal mood and affect. His behavior is normal. Thought content normal.  Nursing note and vitals reviewed.         Assessment & Plan:  1. Pruritic erythematous rash  - triamcinolone cream (KENALOG) 0.1 %; Apply 1 application topically 2 (two) times daily. Apply to AA of trunk, arms, legs. Avoid underarms, genitals, face, groin.  Dispense: 453.6 g; Refill: 3  2. Inflamed seborrheic keratosis - <.5cm lesion on right neck removed via shave excision, benign in appearance, no pathology. Monsels applied post procedure   Continue all meds  Health Maintenance reviewed Diet and exercise encouraged RTO prn   Afrika Brick A. Benjamin Stain PA-C

## 2014-12-21 ENCOUNTER — Other Ambulatory Visit: Payer: Self-pay | Admitting: Orthopedic Surgery

## 2014-12-21 ENCOUNTER — Encounter (HOSPITAL_BASED_OUTPATIENT_CLINIC_OR_DEPARTMENT_OTHER): Payer: Self-pay | Admitting: *Deleted

## 2014-12-21 DIAGNOSIS — G5601 Carpal tunnel syndrome, right upper limb: Secondary | ICD-10-CM | POA: Diagnosis not present

## 2014-12-23 ENCOUNTER — Encounter (HOSPITAL_BASED_OUTPATIENT_CLINIC_OR_DEPARTMENT_OTHER)
Admission: RE | Admit: 2014-12-23 | Discharge: 2014-12-23 | Disposition: A | Discharge status: Home / Self Care | Payer: Medicare Other | Source: Ambulatory Visit | Attending: Orthopedic Surgery | Admitting: Orthopedic Surgery

## 2014-12-23 DIAGNOSIS — F1020 Alcohol dependence, uncomplicated: Secondary | ICD-10-CM | POA: Diagnosis not present

## 2014-12-23 DIAGNOSIS — E785 Hyperlipidemia, unspecified: Secondary | ICD-10-CM | POA: Diagnosis not present

## 2014-12-23 DIAGNOSIS — I10 Essential (primary) hypertension: Secondary | ICD-10-CM | POA: Diagnosis not present

## 2014-12-23 DIAGNOSIS — K219 Gastro-esophageal reflux disease without esophagitis: Secondary | ICD-10-CM | POA: Diagnosis not present

## 2014-12-23 DIAGNOSIS — M1990 Unspecified osteoarthritis, unspecified site: Secondary | ICD-10-CM | POA: Diagnosis not present

## 2014-12-23 DIAGNOSIS — Z955 Presence of coronary angioplasty implant and graft: Secondary | ICD-10-CM | POA: Diagnosis not present

## 2014-12-23 DIAGNOSIS — Q600 Renal agenesis, unilateral: Secondary | ICD-10-CM | POA: Diagnosis not present

## 2014-12-23 DIAGNOSIS — Z87891 Personal history of nicotine dependence: Secondary | ICD-10-CM | POA: Diagnosis not present

## 2014-12-23 DIAGNOSIS — J449 Chronic obstructive pulmonary disease, unspecified: Secondary | ICD-10-CM | POA: Diagnosis not present

## 2014-12-23 DIAGNOSIS — G5601 Carpal tunnel syndrome, right upper limb: Secondary | ICD-10-CM | POA: Diagnosis present

## 2014-12-23 DIAGNOSIS — I739 Peripheral vascular disease, unspecified: Secondary | ICD-10-CM | POA: Diagnosis not present

## 2014-12-23 DIAGNOSIS — I2510 Atherosclerotic heart disease of native coronary artery without angina pectoris: Secondary | ICD-10-CM | POA: Diagnosis not present

## 2014-12-23 LAB — BASIC METABOLIC PANEL
ANION GAP: 8 (ref 5–15)
BUN: 16 mg/dL (ref 6–20)
CALCIUM: 9 mg/dL (ref 8.9–10.3)
CO2: 27 mmol/L (ref 22–32)
Chloride: 105 mmol/L (ref 101–111)
Creatinine, Ser: 0.78 mg/dL (ref 0.61–1.24)
Glucose, Bld: 160 mg/dL — ABNORMAL HIGH (ref 65–99)
Potassium: 4.4 mmol/L (ref 3.5–5.1)
SODIUM: 140 mmol/L (ref 135–145)

## 2014-12-23 NOTE — H&P (Signed)
Brad [name] is an 81 y.o. male.   CC / Reason for Visit: Right hand numbness with upper extremity pain HPI: This patient returns to clinic today after having undergone nerve conduction studies which demonstrated severe right-sided carpal tunnel syndrome.  He is here today with his son and they are requesting a carpal tunnel release.  He reports that he had no real changes with the injection.  HPI 11/03/2014: This patient is an 81 year old RHD male who presents for evaluation of problems largely involving the right upper extremity.  He has a variety of medical records with him.  It would seem that he has had trouble with cervicalgia, possibly even some radicular issues going on in 2012.  In addition, he was evaluated at that time by Dr. Daryll Brod, and the records indicate that he had some electrodiagnostic abnormalities such that it would appear that an ulnar nerve transposition and carpal tunnel release were considered.  It was noted he also has some cervical foraminal stenosis.  Nonetheless, the patient has had no surgery.  He recently began having much more pain at the base of the right hand radiating up the arm, but also some intrinsic shoulder pain and pain in the right pectoralis region.  He was given 6 mg of IM Celestone on 6-to-16 which he reports began to help some of the symptoms in about a day and a half.  He is on Plavix.  Past Medical History  Diagnosis Date  . HTN (hypertension)     Last echo 1/07:  Normal LVF, LV thickness upper limits of normal, mild aortic root dilatation, mild to mod MAC, mild LAE, borderline RVH, mild RAE.  . Tobacco abuse   . Ethanolism   . COPD (chronic obstructive pulmonary disease)   . Unilateral congenital absence of kidney   . CAD (coronary artery disease)     s/p stent to OM2 in past;  Last LHC 3/05: EF 60%, left RA occluded, right RA ok, prox to mid LAD 60%, oD1 75-80%, oD2 small 75%, pOM1 50%, OM2 stent ok with 50% before stent, mRCA 30%, dRCA 30%, PDA  70%.  Last dobutamine myoview 5/06: no scar or ischemia, EF 60%.  Marland Kitchen HLD (hyperlipidemia)   . PAD (peripheral artery disease)   . S/P AAA repair   . Ascending aortic aneurysm     Dr. Roxan Hockey  . DJD (degenerative joint disease)   . GERD (gastroesophageal reflux disease)     Past Surgical History  Procedure Laterality Date  . Appendectomy    . Hernia repair    . Resection and grafting of infarenal  abdominal  aortic aneurysm  05/03/2002    Family History  Problem Relation Age of Onset  . Cancer Mother   . Cancer Sister   . COPD Sister   . COPD Brother   . Cancer Brother     Lip   Social History:  reports that he quit smoking about 18 years ago. He has never used smokeless tobacco. He reports that he drinks alcohol. He reports that he does not use illicit drugs.  Allergies:  Allergies  Allergen Reactions  . Aspirin Swelling  . Atorvastatin Other (See Comments)    Leg pain, feet pain  . Cephalexin Other (See Comments)    unknown    No prescriptions prior to admission    Results for orders placed or performed during the hospital encounter of 12/26/14 (from the past 48 hour(s))  Basic metabolic panel     Status:  Abnormal   Collection Time: 12/23/14 11:20 AM  Result Value Ref Range   Sodium 140 135 - 145 mmol/L   Potassium 4.4 3.5 - 5.1 mmol/L   Chloride 105 101 - 111 mmol/L   CO2 27 22 - 32 mmol/L   Glucose, Bld 160 (H) 65 - 99 mg/dL   BUN 16 6 - 20 mg/dL   Creatinine, Ser 0.78 0.61 - 1.24 mg/dL   Calcium 9.0 8.9 - 10.3 mg/dL   GFR calc non Af Amer >60 >60 mL/min   GFR calc Af Amer >60 >60 mL/min    Comment: (NOTE) The eGFR has been calculated using the CKD EPI equation. This calculation has not been validated in all clinical situations. eGFR's persistently <60 mL/min signify possible Chronic Kidney Disease.    Anion gap 8 5 - 15   No results found.  Review of Systems  All other systems reviewed and are negative.   Height 5' 6"  (1.676 m), weight  88.905 kg (196 lb). Physical Exam  Constitutional:  WD, WN, NAD HEENT:  NCAT, EOMI Neuro/Psych:  Alert & oriented to person, place, and time; appropriate mood & affect Lymphatic: No generalized UE edema or lymphadenopathy Extremities / MSK:  Both UE are normal with respect to appearance, ranges of motion, joint stability, muscle strength/tone, sensation, & perfusion except as otherwise noted:  Thenar musculature is a little flattened.  He has a Tinel sign over the median nerve at the base of the palm and he has subjectively altered sensibility in the median distribution, with appropriate ring finger splitting.  Monofilaments measure 4.31 in the right median distribution, 3.61 in the right ulnar, 2.83 across all digits on the left.  Grip strength in position 2: Right 20/left 60  Labs / Xrays:  No radiographic studies obtained today.  Assessment: Right CTS   Plan:  The findings were discussed with the patient as well as his son.  He would like to proceed with endoscopic carpal tunnel release on the right.The details of the operative procedure were discussed with the patient.  Questions were invited and answered.  In addition to the goal of the procedure, the risks of the procedure to include but not limited to bleeding; infection; damage to the nerves or blood vessels that could result in bleeding, numbness, weakness, chronic pain, and the need for additional procedures; stiffness; the need for revision surgery; and anesthetic risks were reviewed.  No specific outcome was guaranteed or implied.  Informed consent was obtained.   Brad [name] A. 12/23/2014, 2:40 PM

## 2014-12-26 ENCOUNTER — Other Ambulatory Visit: Payer: Self-pay | Admitting: Physician Assistant

## 2014-12-26 ENCOUNTER — Ambulatory Visit (HOSPITAL_BASED_OUTPATIENT_CLINIC_OR_DEPARTMENT_OTHER): Payer: Medicare Other | Admitting: Anesthesiology

## 2014-12-26 ENCOUNTER — Encounter (HOSPITAL_BASED_OUTPATIENT_CLINIC_OR_DEPARTMENT_OTHER): Payer: Self-pay | Admitting: Anesthesiology

## 2014-12-26 ENCOUNTER — Ambulatory Visit (HOSPITAL_BASED_OUTPATIENT_CLINIC_OR_DEPARTMENT_OTHER)
Admission: RE | Admit: 2014-12-26 | Discharge: 2014-12-26 | Disposition: A | Discharge status: Home / Self Care | Payer: Medicare Other | Source: Ambulatory Visit | Attending: Orthopedic Surgery | Admitting: Orthopedic Surgery

## 2014-12-26 ENCOUNTER — Encounter (HOSPITAL_BASED_OUTPATIENT_CLINIC_OR_DEPARTMENT_OTHER)
Admission: RE | Disposition: A | Discharge status: Home / Self Care | Payer: Self-pay | Source: Ambulatory Visit | Attending: Orthopedic Surgery

## 2014-12-26 DIAGNOSIS — I10 Essential (primary) hypertension: Secondary | ICD-10-CM | POA: Insufficient documentation

## 2014-12-26 DIAGNOSIS — I739 Peripheral vascular disease, unspecified: Secondary | ICD-10-CM | POA: Insufficient documentation

## 2014-12-26 DIAGNOSIS — Q600 Renal agenesis, unilateral: Secondary | ICD-10-CM | POA: Diagnosis not present

## 2014-12-26 DIAGNOSIS — G5601 Carpal tunnel syndrome, right upper limb: Secondary | ICD-10-CM | POA: Insufficient documentation

## 2014-12-26 DIAGNOSIS — E785 Hyperlipidemia, unspecified: Secondary | ICD-10-CM | POA: Insufficient documentation

## 2014-12-26 DIAGNOSIS — K219 Gastro-esophageal reflux disease without esophagitis: Secondary | ICD-10-CM | POA: Insufficient documentation

## 2014-12-26 DIAGNOSIS — Z87891 Personal history of nicotine dependence: Secondary | ICD-10-CM | POA: Insufficient documentation

## 2014-12-26 DIAGNOSIS — J449 Chronic obstructive pulmonary disease, unspecified: Secondary | ICD-10-CM | POA: Diagnosis not present

## 2014-12-26 DIAGNOSIS — Z955 Presence of coronary angioplasty implant and graft: Secondary | ICD-10-CM | POA: Insufficient documentation

## 2014-12-26 DIAGNOSIS — F1020 Alcohol dependence, uncomplicated: Secondary | ICD-10-CM | POA: Insufficient documentation

## 2014-12-26 DIAGNOSIS — I2510 Atherosclerotic heart disease of native coronary artery without angina pectoris: Secondary | ICD-10-CM | POA: Insufficient documentation

## 2014-12-26 DIAGNOSIS — M1990 Unspecified osteoarthritis, unspecified site: Secondary | ICD-10-CM | POA: Insufficient documentation

## 2014-12-26 HISTORY — PX: CARPAL TUNNEL RELEASE: SHX101

## 2014-12-26 LAB — POCT HEMOGLOBIN-HEMACUE: HEMOGLOBIN: 13.1 g/dL (ref 13.0–17.0)

## 2014-12-26 SURGERY — RELEASE, CARPAL TUNNEL, ENDOSCOPIC
Anesthesia: Monitor Anesthesia Care | Site: Wrist | Laterality: Right

## 2014-12-26 MED ORDER — MIDAZOLAM HCL 2 MG/2ML IJ SOLN: 1.0000 mg | INTRAMUSCULAR | Status: DC | PRN

## 2014-12-26 MED ORDER — HYDROMORPHONE HCL 1 MG/ML IJ SOLN: 0.2500 mg | INTRAMUSCULAR | Status: DC | PRN

## 2014-12-26 MED ORDER — FENTANYL CITRATE (PF) 100 MCG/2ML IJ SOLN: 50.0000 ug | INTRAMUSCULAR | Status: DC | PRN

## 2014-12-26 MED ORDER — GLYCOPYRROLATE 0.2 MG/ML IJ SOLN
0.2000 mg | Freq: Once | INTRAMUSCULAR | Status: AC | PRN
  Administered 2014-12-26: 0.2 mg via INTRAVENOUS

## 2014-12-26 MED ORDER — ONDANSETRON HCL 4 MG/2ML IJ SOLN
INTRAMUSCULAR | Status: DC | PRN
  Administered 2014-12-26: 4 mg via INTRAVENOUS

## 2014-12-26 MED ORDER — CLINDAMYCIN PHOSPHATE 900 MG/50ML IV SOLN
INTRAVENOUS | Status: AC
  Filled 2014-12-26: qty 50

## 2014-12-26 MED ORDER — SCOPOLAMINE 1 MG/3DAYS TD PT72: 1.0000 | MEDICATED_PATCH | Freq: Once | TRANSDERMAL | Status: DC | PRN

## 2014-12-26 MED ORDER — ONDANSETRON HCL 4 MG/2ML IJ SOLN: 4.0000 mg | Freq: Once | INTRAMUSCULAR | Status: DC | PRN

## 2014-12-26 MED ORDER — LIDOCAINE HCL 2 % IJ SOLN
INTRAMUSCULAR | Status: DC | PRN
  Administered 2014-12-26: 5 mL

## 2014-12-26 MED ORDER — FENTANYL CITRATE (PF) 100 MCG/2ML IJ SOLN
INTRAMUSCULAR | Status: DC | PRN
  Administered 2014-12-26: 50 ug via INTRAVENOUS

## 2014-12-26 MED ORDER — CLINDAMYCIN PHOSPHATE 900 MG/50ML IV SOLN
900.0000 mg | INTRAVENOUS | Status: AC
  Administered 2014-12-26: 900 mg via INTRAVENOUS

## 2014-12-26 MED ORDER — PROPOFOL INFUSION 10 MG/ML OPTIME
INTRAVENOUS | Status: DC | PRN
  Administered 2014-12-26: 25 ug/kg/min via INTRAVENOUS

## 2014-12-26 MED ORDER — MEPERIDINE HCL 25 MG/ML IJ SOLN: 6.2500 mg | INTRAMUSCULAR | Status: DC | PRN

## 2014-12-26 MED ORDER — BUPIVACAINE-EPINEPHRINE (PF) 0.5% -1:200000 IJ SOLN
INTRAMUSCULAR | Status: DC | PRN
  Administered 2014-12-26: 5 mL via PERINEURAL

## 2014-12-26 MED ORDER — HYDROCODONE-ACETAMINOPHEN 5-325 MG PO TABS: 1.0000 | ORAL_TABLET | Freq: Four times a day (QID) | ORAL | Status: DC | PRN

## 2014-12-26 MED ORDER — LACTATED RINGERS IV SOLN
INTRAVENOUS | Status: DC
  Administered 2014-12-26: 10:00:00 via INTRAVENOUS

## 2014-12-26 MED ORDER — FENTANYL CITRATE (PF) 100 MCG/2ML IJ SOLN
INTRAMUSCULAR | Status: AC
  Filled 2014-12-26: qty 6

## 2014-12-26 SURGICAL SUPPLY — 42 items
APPLICATOR COTTON TIP 6IN STRL (MISCELLANEOUS) ×3 IMPLANT
BLADE HOOK ENDO STRL (BLADE) ×3 IMPLANT
BLADE SURG 15 STRL LF DISP TIS (BLADE) ×1 IMPLANT
BLADE SURG 15 STRL SS (BLADE) ×3
BLADE TRIANGLE EPF/EGR ENDO (BLADE) ×3 IMPLANT
BNDG CMPR 9X4 STRL LF SNTH (GAUZE/BANDAGES/DRESSINGS) ×1
BNDG COHESIVE 4X5 TAN STRL (GAUZE/BANDAGES/DRESSINGS) ×3 IMPLANT
BNDG ESMARK 4X9 LF (GAUZE/BANDAGES/DRESSINGS) ×3 IMPLANT
BNDG GAUZE ELAST 4 BULKY (GAUZE/BANDAGES/DRESSINGS) ×3 IMPLANT
CHLORAPREP W/TINT 26ML (MISCELLANEOUS) ×3 IMPLANT
CORDS BIPOLAR (ELECTRODE) ×2 IMPLANT
COVER BACK TABLE 60X90IN (DRAPES) ×3 IMPLANT
COVER MAYO STAND STRL (DRAPES) ×3 IMPLANT
CUFF TOURNIQUET SINGLE 18IN (TOURNIQUET CUFF) ×2 IMPLANT
DRAIN TLS ROUND 10FR (DRAIN) ×3 IMPLANT
DRAPE EXTREMITY T 121X128X90 (DRAPE) ×3 IMPLANT
DRAPE SURG 17X23 STRL (DRAPES) ×3 IMPLANT
DRSG EMULSION OIL 3X3 NADH (GAUZE/BANDAGES/DRESSINGS) ×3 IMPLANT
GLOVE BIO SURGEON STRL SZ7.5 (GLOVE) ×3 IMPLANT
GLOVE BIOGEL PI IND STRL 7.0 (GLOVE) ×1 IMPLANT
GLOVE BIOGEL PI IND STRL 8 (GLOVE) ×1 IMPLANT
GLOVE BIOGEL PI INDICATOR 7.0 (GLOVE) ×4
GLOVE BIOGEL PI INDICATOR 8 (GLOVE) ×2
GLOVE ECLIPSE 6.5 STRL STRAW (GLOVE) ×5 IMPLANT
GOWN STRL REUS W/ TWL LRG LVL3 (GOWN DISPOSABLE) ×2 IMPLANT
GOWN STRL REUS W/TWL LRG LVL3 (GOWN DISPOSABLE) ×6
GOWN STRL REUS W/TWL XL LVL3 (GOWN DISPOSABLE) ×3 IMPLANT
NDL HYPO 25X1 1.5 SAFETY (NEEDLE) IMPLANT
NEEDLE HYPO 25X1 1.5 SAFETY (NEEDLE) ×3 IMPLANT
NS IRRIG 1000ML POUR BTL (IV SOLUTION) ×3 IMPLANT
PACK BASIN DAY SURGERY FS (CUSTOM PROCEDURE TRAY) ×3 IMPLANT
PADDING CAST ABS 4INX4YD NS (CAST SUPPLIES)
PADDING CAST ABS COTTON 4X4 ST (CAST SUPPLIES) IMPLANT
SPONGE GAUZE 4X4 12PLY STER LF (GAUZE/BANDAGES/DRESSINGS) ×3 IMPLANT
STOCKINETTE 6  STRL (DRAPES) ×2
STOCKINETTE 6 STRL (DRAPES) ×1 IMPLANT
SUT VICRYL RAPIDE 4-0 (SUTURE) ×3 IMPLANT
SYR BULB 3OZ (MISCELLANEOUS) ×3 IMPLANT
SYRINGE 10CC LL (SYRINGE) ×2 IMPLANT
TOWEL OR 17X24 6PK STRL BLUE (TOWEL DISPOSABLE) ×6 IMPLANT
TOWEL OR NON WOVEN STRL DISP B (DISPOSABLE) ×3 IMPLANT
UNDERPAD 30X30 (UNDERPADS AND DIAPERS) ×3 IMPLANT

## 2014-12-26 NOTE — Op Note (Signed)
12/26/2014  10:23 AM  PATIENT:  Brad [name]  81 y.o. male  PRE-OPERATIVE DIAGNOSIS:  RIGHT SEVERE CARPAL TUNNEL SYNDROME  POST-OPERATIVE DIAGNOSIS:  Same  PROCEDURE:  Procedure(s): RIGHT ENDOSCOPIC CARPAL TUNNEL SYNDROME RELEASE  SURGEON:  Surgeon(s): Milly Jakob, MD  PHYSICIAN ASSISTANT: Morley Kos, OPA-C  ANESTHESIA:  Local / MAC  SPECIMENS:  None  DRAINS:   TLS x 1  EBL:  less than 50 mL  PREOPERATIVE INDICATIONS:  Brad [name] is a  81 y.o. male with a diagnosis of RIGHT SEVERE CARPAL TUNNEL SYNDROME who failed conservative measures and elected for surgical management.    The risks benefits and alternatives were discussed with the patient preoperatively including but not limited to the risks of infection, bleeding, nerve injury, cardiopulmonary complications, the need for revision surgery, among others, and the patient verbalized understanding and consented to proceed.  OPERATIVE IMPLANTS: None  OPERATIVE FINDINGS: Tight carpal tunnel, acceptably decompressed following release  OPERATIVE PROCEDURE:  After receiving prophylactic antibiotics, the patient was escorted to the operative theatre and placed in a supine position.   A surgical "time-out" was performed during which the planned procedure, proposed operative site, and the correct patient identity were compared to the operative consent and agreement confirmed by the circulating nurse according to current facility policy.  Following application of a tourniquet to the operative extremity, the planned incisions were marked and anesthetized with a mixture of lidocaine & bupivicaine containing epinephrine.  The exposed skin was prepped with Chloraprep and draped in the usual sterile fashion.  The limb was exsanguinated with an Esmarch bandage and the tourniquet inflated to approximately 189mmHg higher than systolic BP.   The incisions were made sharply. Subcutaneous tissues were dissected with blunt and spreading  dissection. At the proximal incision, the deep forearm fascia was split in line with the skin incision the distal edge grasped with a hemostat. At the mid palmar incision, the palmar fascia was split in line with the skin incision, revealing the underlying superficial palmar arch which was visualized with loupe assisted magnification. The synovial reflector was then introduced into the proximal incision and passed through the carpal canal, uses to reflect synovium from the deep surface of the transverse carpal ligament. It was removed and replaced with the slotted cannula and blunt obturator, passing from proximal to distal and exiting the distal wound superficial to the superficial palmar arch. The obturator was removed and the camera inserted. Visualization of the ligament was acceptable. The triangle shape blade was then inserted distally, advanced to the midportion of the ligament, and there used to create a perforation in the ligament. This instrument was removed and the hooked nstrument was inserted. It was placed into the perforation in the ligament and withdrawn distally, completing transection of the distal half of the ligament. The camera was then removed and placed into the distal end of the cannula. The hooked instrument was placed into the proximal end and advanced facility to be placed into the apex of the V. which had been formed to the distal hemi-transection of the ligament. It was withdrawn proximally, completing transection of the ligament. The adequacy of the release was judged with the scope and the instruments used as a probe. All of the endoscopic instruments are removed and the adequacy of release was again judged from the proximal incision perspective with direct loupe assisted visualization. In addition, the proximal forearm fascia was split for 2 inches proximal to the proximal incision under direct visualization using a sliding  scissor technique. The tourniquet was released, the wound  copiously irrigated. A TLS drain was placed to lie alongside the nerve exiting its own skin hole distally made with some sharp trocar. The skin was then closed with 4-0 Vicryl Rapide interrupted sutures. A light dressing was applied and the balance of 10 mL of the initial anesthetic mixture was placed down the drain, and the drain was clamped to be unclamped in the recovery room.  DISPOSITION:  The patient will be discharged home today, returning in 10-15 days for re-assessment.

## 2014-12-26 NOTE — Anesthesia Postprocedure Evaluation (Signed)
Anesthesia Post Note  Patient: Brad [name]  Procedure(s) Performed: Procedure(s) (LRB): RIGHT ENDOSCOPIC CARPAL TUNNEL SYNDROME RELEASE (Right)  Anesthesia type: general  Patient location: PACU  Post pain: Pain level controlled  Post assessment: Patient's Cardiovascular Status Stable  Last Vitals:  Filed Vitals:   12/26/14 1345  BP: 118/60  Pulse: 55  Temp: 36.4 C  Resp: 16    Post vital signs: Reviewed and stable  Level of consciousness: sedated  Complications: No apparent anesthesia complications. Asymptomatic cardiac-wise Some PAC's but sinus rhythm. OK for DC.

## 2014-12-26 NOTE — Discharge Instructions (Signed)
Discharge Instructions   You have a light dressing on your hand.  You may begin gentle motion of your fingers and hand immediately, but you should not do any heavy lifting or gripping.  Elevate your hand to reduce pain & swelling of the digits.  Ice over the operative site may be helpful to reduce pain & swelling.  DO NOT USE HEAT. Pain medicine has been prescribed for you.  Use your medicine as needed over the first 48 hours, and then you can begin to taper your use. You may use Tylenol in place of your prescribed pain medication, but not IN ADDITION to it. Leave the dressing in place until the third day after your surgery and then remove it, leaving it open to air.  After the bandage has been removed you may shower, but do not soak the incision.  You may drive a car when you are off of prescription pain medications and can safely control your vehicle with both hands. We will address whether therapy will be required or not when you return to the office. You may have already made your follow-up appointment when we completed your preop visit.  If not, please call our office today or the next business day to make your return appointment for 10-15 days after surgery.   Please call [phone] during normal business hours or [phone] after hours for any problems. Including the following:  - excessive redness of the incisions - drainage for more than 4 days - fever of more than 101.5 F  *Please note that pain medications will not be refilled after hours or on weekends.   TLS Drain Instructions You have a drain tube in place to help limit the amount of blood that collects under your skin in the early post-operative period.  The amount it drains will vary from person-to-person and is dependent upon many factors.  You will have 1-2 extra drain tubes sent with you from the facility.  You should change the tube according to these instructions below when the tube is about half full.  BE SURE TO  SLIDE THE CLAMP TO THE CLOSED POSITION BEFORE CHANGING THE TUBE.    RE-OPEN THE CLAMP ONCE THE GLASS EVACUATION TUBE HAS BEEN CHANGED.  24 hours after surgery the amount of drainage will likely be negligible and it will be time to remove the tube and discard it.  Simply remove the tape that secures the flexible plastic drainage tube to the bandage and briskly pull the tube straight out.  You will likely feel a little discomfort during this process, but the tube should slide out as it is not sewn or otherwise secured directly to your body.  It is merely held in place by the bandage itself.                              If you are not clear about when your first post-op appointment is scheduled, please call the office on the next business day to inquire about it.  Please also call the office if you have any other questions [phone]).      Post Anesthesia Home Care Instructions  Activity: Get plenty of rest for the remainder of the day. A responsible adult should stay with you for 24 hours following the procedure.  For the next 24 hours, DO NOT: -Drive a car -Paediatric nurse -Drink alcoholic beverages -Take any medication unless instructed by your physician -Make any legal  decisions or sign important papers.  Meals: Start with liquid foods such as gelatin or soup. Progress to regular foods as tolerated. Avoid greasy, spicy, heavy foods. If nausea and/or vomiting occur, drink only clear liquids until the nausea and/or vomiting subsides. Call your physician if vomiting continues.  Special Instructions/Symptoms: Your throat may feel dry or sore from the anesthesia or the breathing tube placed in your throat during surgery. If this causes discomfort, gargle with warm salt water. The discomfort should disappear within 24 hours.  If you had a scopolamine patch placed behind your ear for the management of post- operative nausea and/or vomiting:  1. The medication in the patch is effective  for 72 hours, after which it should be removed.  Wrap patch in a tissue and discard in the trash. Wash hands thoroughly with soap and water. 2. You may remove the patch earlier than 72 hours if you experience unpleasant side effects which may include dry mouth, dizziness or visual disturbances. 3. Avoid touching the patch. Wash your hands with soap and water after contact with the patch.

## 2014-12-26 NOTE — Interval H&P Note (Signed)
History and Physical Interval Note:  12/26/2014 10:23 AM  Brad [name]  has presented today for surgery, with the diagnosis of RIGHT SEVERE CARPAL TUNNEL SYNDROME  The various methods of treatment have been discussed with the patient and family. After consideration of risks, benefits and other options for treatment, the patient has consented to  Procedure(s): RIGHT ENDOSCOPIC CARPAL TUNNEL SYNDROME RELEASE (Right) as a surgical intervention .  The patient's history has been reviewed, patient examined, no change in status, stable for surgery.  I have reviewed the patient's chart and labs.  Questions were answered to the patient's satisfaction.     Ruie Sendejo A.

## 2014-12-26 NOTE — Transfer of Care (Signed)
Immediate Anesthesia Transfer of Care Note  Patient: Brad [name]  Procedure(s) Performed: Procedure(s): RIGHT ENDOSCOPIC CARPAL TUNNEL SYNDROME RELEASE (Right)  Patient Location: PACU  Anesthesia Type:MAC  Level of Consciousness: awake, alert  and oriented  Airway & Oxygen Therapy: Patient Spontanous Breathing and Patient connected to face mask oxygen  Post-op Assessment: Report given to RN and Post -op Vital signs reviewed and stable  Post vital signs: Reviewed and stable  Last Vitals:  Filed Vitals:   12/26/14 0929  BP: 113/61  Pulse: 95  Temp: 36.6 C  Resp: 20    Complications: No apparent anesthesia complications

## 2014-12-26 NOTE — Anesthesia Preprocedure Evaluation (Signed)
Anesthesia Evaluation  Patient identified by MRN, date of birth, ID band Patient awake    Reviewed: Allergy & Precautions, NPO status , Patient's Chart, lab work & pertinent test results  Airway Mallampati: II  TM Distance: >3 FB Neck ROM: Full    Dental   Pulmonary COPDformer smoker,    Pulmonary exam normal       Cardiovascular hypertension, Pt. on medications + CAD Normal cardiovascular exam    Neuro/Psych    GI/Hepatic GERD-  Medicated and Controlled,  Endo/Other    Renal/GU      Musculoskeletal   Abdominal   Peds  Hematology   Anesthesia Other Findings   Reproductive/Obstetrics                             Anesthesia Physical Anesthesia Plan  ASA: III  Anesthesia Plan: MAC   Post-op Pain Management:    Induction: Intravenous  Airway Management Planned: Simple Face Mask  Additional Equipment:   Intra-op Plan:   Post-operative Plan:   Informed Consent: I have reviewed the patients History and Physical, chart, labs and discussed the procedure including the risks, benefits and alternatives for the proposed anesthesia with the patient or authorized representative who has indicated his/her understanding and acceptance.     Plan Discussed with: CRNA and Surgeon  Anesthesia Plan Comments:         Anesthesia Quick Evaluation

## 2014-12-27 ENCOUNTER — Encounter (HOSPITAL_BASED_OUTPATIENT_CLINIC_OR_DEPARTMENT_OTHER): Payer: Self-pay | Admitting: Orthopedic Surgery

## 2014-12-27 NOTE — Addendum Note (Signed)
Addendum  created 12/27/14 3149 by Ernesta Amble Halleigh Comes, CRNA   Modules edited: Charges VN

## 2015-01-02 ENCOUNTER — Encounter: Payer: Self-pay | Admitting: Family Medicine

## 2015-01-02 ENCOUNTER — Ambulatory Visit (INDEPENDENT_AMBULATORY_CARE_PROVIDER_SITE_OTHER): Payer: Medicare Other | Admitting: Family Medicine

## 2015-01-02 VITALS — BP 128/61 | HR 50 | Temp 97.1°F | Ht 66.0 in | Wt 198.4 lb

## 2015-01-02 DIAGNOSIS — I2584 Coronary atherosclerosis due to calcified coronary lesion: Secondary | ICD-10-CM

## 2015-01-02 DIAGNOSIS — I10 Essential (primary) hypertension: Secondary | ICD-10-CM | POA: Diagnosis not present

## 2015-01-02 DIAGNOSIS — R3989 Other symptoms and signs involving the genitourinary system: Secondary | ICD-10-CM | POA: Diagnosis not present

## 2015-01-02 DIAGNOSIS — E785 Hyperlipidemia, unspecified: Secondary | ICD-10-CM

## 2015-01-02 DIAGNOSIS — I2510 Atherosclerotic heart disease of native coronary artery without angina pectoris: Secondary | ICD-10-CM | POA: Diagnosis not present

## 2015-01-02 DIAGNOSIS — N400 Enlarged prostate without lower urinary tract symptoms: Secondary | ICD-10-CM

## 2015-01-02 DIAGNOSIS — G4700 Insomnia, unspecified: Secondary | ICD-10-CM | POA: Diagnosis not present

## 2015-01-02 MED ORDER — TAMSULOSIN HCL 0.4 MG PO CAPS: 0.8000 mg | ORAL_CAPSULE | Freq: Every day | ORAL | Status: DC

## 2015-01-02 NOTE — Progress Notes (Signed)
Subjective:  Patient ID: Brad [name], male    DOB: 1927-10-18  Age: 81 y.o. MRN: [id number]  CC: Hypertension   HPI Brad [name] presents for  follow-up of hypertension. Patient has no history of headache chest pain or shortness of breath or recent cough. Patient also denies symptoms of TIA such as numbness weakness lateralizing. Patient does not check  blood pressure at home. Patient denies side effects from his medication. States taking it regularly.   History Brad [name] has a past medical history of HTN (hypertension); Tobacco abuse; Ethanolism; COPD (chronic obstructive pulmonary disease); Unilateral congenital absence of kidney; CAD (coronary artery disease); HLD (hyperlipidemia); PAD (peripheral artery disease); S/P AAA repair; Ascending aortic aneurysm; DJD (degenerative joint disease); and GERD (gastroesophageal reflux disease).   He has past surgical history that includes Appendectomy; Hernia repair; RESECTION AND GRAFTING OF INFARENAL  ABDOMINAL  AORTIC ANEURYSM (05/03/2002); and Carpal tunnel release (Right, 12/26/2014).   His family history includes COPD in his brother and sister; Cancer in his brother, mother, and sister.He reports that he quit smoking about 18 years ago. He has never used smokeless tobacco. He reports that he drinks alcohol. He reports that he does not use illicit drugs.  Current Outpatient Prescriptions on File Prior to Visit  Medication Sig Dispense Refill  . amLODipine (NORVASC) 5 MG tablet Take 1 tablet (5 mg total) by mouth daily. 30 tablet 11  . celecoxib (CELEBREX) 200 MG capsule Take 1 capsule (200 mg total) by mouth daily. With food. For joint pain 30 capsule 5  . clobetasol (TEMOVATE) 0.05 % external solution Apply 2-3 drops to AA of scalp BID 50 mL 4  . clopidogrel (PLAVIX) 75 MG tablet Take 75 mg by mouth daily.    . fish oil-omega-3 fatty acids 1000 MG capsule Take 1 g by mouth daily.      . furosemide (LASIX) 20 MG tablet Take 1 tablet (20 mg  total) by mouth daily. 30 tablet 11  . isosorbide mononitrate (IMDUR) 30 MG 24 hr tablet TAKE 1 TABLET BY MOUTH ONCE A DAY 30 tablet 3  . ketoconazole (NIZORAL) 2 % shampoo Use 2-3 times weekly. Leave on for 5 min. Rinse. 120 mL 0  . loratadine (CLARITIN) 10 MG tablet Take 1 tablet (10 mg total) by mouth daily. 30 tablet 11  . Multiple Vitamins-Minerals (PRESERVISION AREDS PO) Take 1 tablet by mouth daily.    . potassium chloride SA (K-DUR,KLOR-CON) 20 MEQ tablet Take 1 tablet (20 mEq total) by mouth daily. 30 tablet 11  . rosuvastatin (CRESTOR) 10 MG tablet Take 1 tablet (10 mg total) by mouth daily. (Patient taking differently: Take 5 mg by mouth daily. ) 30 tablet 11  . triamcinolone cream (KENALOG) 0.1 % Apply 1 application topically 2 (two) times daily. Apply to AA of trunk, arms, legs. Avoid underarms, genitals, face, groin. 453.6 g 3  . TURMERIC CURCUMIN PO Take 1 tablet by mouth daily.     No current facility-administered medications on file prior to visit.    ROS Review of Systems  Constitutional: Negative for fever, chills and diaphoresis.  HENT: Negative for congestion, rhinorrhea and sore throat.   Respiratory: Negative for cough, shortness of breath and wheezing.   Cardiovascular: Negative for chest pain.  Gastrointestinal: Negative for nausea, vomiting, abdominal pain, diarrhea and abdominal distention.  Genitourinary: Positive for urgency and frequency. Negative for dysuria.  Musculoskeletal: Negative for joint swelling and arthralgias.  Skin: Negative for rash.  Neurological: Negative for headaches.  Psychiatric/Behavioral: Positive for sleep disturbance.    Objective:  BP 128/61 mmHg  Pulse 50  Temp(Src) 97.1 F (36.2 C) (Oral)  Ht 5\' 6"  (1.676 m)  Wt 198 lb 6.4 oz (89.994 kg)  BMI 32.04 kg/m2  BP Readings from Last 3 Encounters:  01/02/15 128/61  12/26/14 118/60  12/16/14 125/69    Wt Readings from Last 3 Encounters:  01/02/15 198 lb 6.4 oz (89.994 kg)    12/26/14 197 lb (89.359 kg)  12/16/14 198 lb 3.2 oz (89.903 kg)     Physical Exam  Constitutional: He is oriented to person, place, and time. He appears well-developed and well-nourished. No distress.  HENT:  Head: Normocephalic and atraumatic.  Right Ear: External ear normal.  Left Ear: External ear normal.  Nose: Nose normal.  Mouth/Throat: Oropharynx is clear and moist.  Eyes: Conjunctivae and EOM are normal. Pupils are equal, round, and reactive to light.  Neck: Normal range of motion. Neck supple. No thyromegaly present.  Cardiovascular: Normal rate, regular rhythm and normal heart sounds.   No murmur heard. Pulmonary/Chest: Effort normal and breath sounds normal. No respiratory distress. He has no wheezes. He has no rales.  Abdominal: Soft. Bowel sounds are normal. He exhibits no distension. There is no tenderness.  Lymphadenopathy:    He has no cervical adenopathy.  Neurological: He is alert and oriented to person, place, and time. He has normal reflexes.  Skin: Skin is warm and dry.  Psychiatric: He has a normal mood and affect. His behavior is normal. Judgment and thought content normal.    No results found for: HGBA1C  Lab Results  Component Value Date   WBC 6.3 10/05/2014   HGB 13.1 12/26/2014   HCT 47.0 10/05/2014   PLT 180 10/05/2014   GLUCOSE 160* 12/23/2014   CHOL 141 09/28/2014   TRIG 78 09/28/2014   HDL 47 09/28/2014   LDLCALC 78 09/28/2014   ALT 9 09/28/2014   AST 12 09/28/2014   NA 140 12/23/2014   K 4.4 12/23/2014   CL 105 12/23/2014   CREATININE 0.78 12/23/2014   BUN 16 12/23/2014   CO2 27 12/23/2014   TSH 0.49 05/02/2014    No results found.  Assessment & Plan:   Brad [name] was seen today for hypertension.  Diagnoses and all orders for this visit:  Essential hypertension  Insomnia  Hyperlipemia  BPH (benign prostatic hyperplasia)  Urine troubles -     tamsulosin (FLOMAX) 0.4 MG CAPS capsule; Take 2 capsules (0.8 mg total) by mouth  at bedtime.   I have discontinued [name]'s HYDROcodone-acetaminophen. I have also changed his tamsulosin. Additionally, I am having him maintain his fish oil-omega-3 fatty acids, amLODipine, rosuvastatin, furosemide, potassium chloride SA, TURMERIC CURCUMIN PO, Multiple Vitamins-Minerals (PRESERVISION AREDS PO), celecoxib, ketoconazole, clobetasol, loratadine, triamcinolone cream, clopidogrel, and isosorbide mononitrate.  Meds ordered this encounter  Medications  . tamsulosin (FLOMAX) 0.4 MG CAPS capsule    Sig: Take 2 capsules (0.8 mg total) by mouth at bedtime.    Dispense:  180 capsule    Refill:  3     Follow-up: Return in about 6 months (around 07/05/2015) for hypertension, cholesterol, CPE.  [name], M.D.

## 2015-01-02 NOTE — Patient Instructions (Signed)
Insomnia Insomnia is frequent trouble falling and/or staying asleep. Insomnia can be a long term problem or a short term problem. Both are common. Insomnia can be a short term problem when the wakefulness is related to a certain stress or worry. Long term insomnia is often related to ongoing stress during waking hours and/or poor sleeping habits. Overtime, sleep deprivation itself can make the problem worse. Every little thing feels more severe because you are overtired and your ability to cope is decreased. CAUSES   Stress, anxiety, and depression.  Poor sleeping habits.  Distractions such as TV in the bedroom.  Naps close to bedtime.  Engaging in emotionally charged conversations before bed.  Technical reading before sleep.  Alcohol and other sedatives. They may make the problem worse. They can hurt normal sleep patterns and normal dream activity.  Stimulants such as caffeine for several hours prior to bedtime.  Pain syndromes and shortness of breath can cause insomnia.  Exercise late at night.  Changing time zones may cause sleeping problems (jet lag). It is sometimes helpful to have someone observe your sleeping patterns. They should look for periods of not breathing during the night (sleep apnea). They should also look to see how long those periods last. If you live alone or observers are uncertain, you can also be observed at a sleep clinic where your sleep patterns will be professionally monitored. Sleep apnea requires a checkup and treatment. Give your caregivers your medical history. Give your caregivers observations your family has made about your sleep.  SYMPTOMS   Not feeling rested in the morning.  Anxiety and restlessness at bedtime.  Difficulty falling and staying asleep. TREATMENT   Your caregiver may prescribe treatment for an underlying medical disorders. Your caregiver can give advice or help if you are using alcohol or other drugs for self-medication. Treatment  of underlying problems will usually eliminate insomnia problems.  Medications can be prescribed for short time use. They are generally not recommended for lengthy use.  Over-the-counter sleep medicines are not recommended for lengthy use. They can be habit forming.  You can promote easier sleeping by making lifestyle changes such as:  Using relaxation techniques that help with breathing and reduce muscle tension.  Exercising earlier in the day.  Changing your diet and the time of your last meal. No night time snacks.  Establish a regular time to go to bed.  Counseling can help with stressful problems and worry.  Soothing music and white noise may be helpful if there are background noises you cannot remove.  Stop tedious detailed work at least one hour before bedtime. HOME CARE INSTRUCTIONS   Keep a diary. Inform your caregiver about your progress. This includes any medication side effects. See your caregiver regularly. Take note of:  Times when you are asleep.  Times when you are awake during the night.  The quality of your sleep.  How you feel the next day. This information will help your caregiver care for you.  Get out of bed if you are still awake after 15 minutes. Read or do some quiet activity. Keep the lights down. Wait until you feel sleepy and go back to bed.  Keep regular sleeping and waking hours. Avoid naps.  Exercise regularly.  Avoid distractions at bedtime. Distractions include watching television or engaging in any intense or detailed activity like attempting to balance the household checkbook.  Develop a bedtime ritual. Keep a familiar routine of bathing, brushing your teeth, climbing into bed at the same   time each night, listening to soothing music. Routines increase the success of falling to sleep faster.  Use relaxation techniques. This can be using breathing and muscle tension release routines. It can also include visualizing peaceful scenes. You can  also help control troubling or intruding thoughts by keeping your mind occupied with boring or repetitive thoughts like the old concept of counting sheep. You can make it more creative like imagining planting one beautiful flower after another in your backyard garden.  During your day, work to eliminate stress. When this is not possible use some of the previous suggestions to help reduce the anxiety that accompanies stressful situations. MAKE SURE YOU:   Understand these instructions.  Will watch your condition.  Will get help right away if you are not doing well or get worse. Document Released: 05/03/2000 Document Revised: 07/29/2011 Document Reviewed: 06/03/2007 [employer] Patient Information 2015 [employer]. This information is not intended to replace advice given to you by your health care provider. Make sure you discuss any questions you have with your health care provider.  

## 2015-01-04 ENCOUNTER — Encounter: Payer: Self-pay | Admitting: *Deleted

## 2015-01-09 DIAGNOSIS — Z9889 Other specified postprocedural states: Secondary | ICD-10-CM | POA: Diagnosis not present

## 2015-01-16 ENCOUNTER — Ambulatory Visit (INDEPENDENT_AMBULATORY_CARE_PROVIDER_SITE_OTHER): Payer: Medicare Other | Admitting: Internal Medicine

## 2015-01-16 ENCOUNTER — Encounter: Payer: Self-pay | Admitting: Internal Medicine

## 2015-01-16 VITALS — BP 130/62 | HR 53 | Ht 64.0 in | Wt 195.5 lb

## 2015-01-16 DIAGNOSIS — E785 Hyperlipidemia, unspecified: Secondary | ICD-10-CM

## 2015-01-16 DIAGNOSIS — I2510 Atherosclerotic heart disease of native coronary artery without angina pectoris: Secondary | ICD-10-CM | POA: Diagnosis not present

## 2015-01-16 DIAGNOSIS — I2584 Coronary atherosclerosis due to calcified coronary lesion: Secondary | ICD-10-CM | POA: Diagnosis not present

## 2015-01-16 DIAGNOSIS — R001 Bradycardia, unspecified: Secondary | ICD-10-CM | POA: Diagnosis not present

## 2015-01-16 DIAGNOSIS — I10 Essential (primary) hypertension: Secondary | ICD-10-CM | POA: Diagnosis not present

## 2015-01-16 NOTE — Patient Instructions (Signed)
Your physician wants you to follow-up in: 6 months with Dr Theressa Stamps will receive a reminder letter in the mail two months in advance. If you don't receive a letter, please call our office to schedule the follow-up appointment.    Your physician recommends that you continue on your current medications as directed. Please refer to the Current Medication list given to you today.    Thank you for choosing [facility] !

## 2015-01-16 NOTE — Progress Notes (Signed)
Cardiology Office Note   Date:  01/16/2015   ID:  Brad [name], DOB 07/12/1927, MRN [id number]  PCP:  Redge Gainer, MD  Cardiologist:   Dorris Carnes, MD    F/U of CAD     History of Present Illness: Brad [name] is a 81 y.o. male with a history of  CAD, dyslipidemai, PAD, DJD, COPD, HTN. GERD. S/p AAA repari. Last cath in March 2005: LVEF 60%. L RA occluded. R RA ok. 60% prox/mid LD; 75 ot80% D1, 75% small D2, OM2 stent OK 50% prior; PDA 70%. Dobbutamine myoview 5/06: NO ischemia or scar. Echo 1/07: Normal LVEF Mild aortic root dlitation.  I have seen the patient for bradycardia He has worn a holter This showed no severe bradycardia/pauses  This was done in 2014. He has also tried to walk on a treadmill which was difficult. The last time I saw him in clinic was in December    Since seen he has done well from a cardiac standpoint  No CP  Occasional dizziness NO signif SOB  Just had carpal tunnel surgery Is fishing a little.   Current Outpatient Prescriptions  Medication Sig Dispense Refill  . amLODipine (NORVASC) 5 MG tablet Take 1 tablet (5 mg total) by mouth daily. 30 tablet 11  . celecoxib (CELEBREX) 200 MG capsule Take 1 capsule (200 mg total) by mouth daily. With food. For joint pain 30 capsule 5  . clobetasol (TEMOVATE) 0.05 % external solution Apply 2-3 drops to AA of scalp BID 50 mL 4  . clopidogrel (PLAVIX) 75 MG tablet Take 75 mg by mouth daily.    . fish oil-omega-3 fatty acids 1000 MG capsule Take 1 g by mouth daily.      . fluocinolone (VANOS) 0.01 % cream Apply 1 application topically daily.    . furosemide (LASIX) 20 MG tablet Take 1 tablet (20 mg total) by mouth daily. 30 tablet 11  . isosorbide mononitrate (IMDUR) 30 MG 24 hr tablet TAKE 1 TABLET BY MOUTH ONCE A DAY 30 tablet 3  . ketoconazole (NIZORAL) 2 % shampoo Use 2-3 times weekly. Leave on for 5 min. Rinse. 120 mL 0  . loratadine (CLARITIN) 10 MG tablet Take 1 tablet (10 mg total) by mouth daily. 30  tablet 11  . Multiple Vitamins-Minerals (PRESERVISION AREDS PO) Take 1 tablet by mouth daily.    . potassium chloride SA (K-DUR,KLOR-CON) 20 MEQ tablet Take 1 tablet (20 mEq total) by mouth daily. 30 tablet 11  . rosuvastatin (CRESTOR) 10 MG tablet Take 10 mg by mouth daily. Patient taking 1/2 pill daily    . tamsulosin (FLOMAX) 0.4 MG CAPS capsule Take 2 capsules (0.8 mg total) by mouth at bedtime. 180 capsule 3  . triamcinolone cream (KENALOG) 0.1 % Apply 1 application topically 2 (two) times daily. Apply to AA of trunk, arms, legs. Avoid underarms, genitals, face, groin. 453.6 g 3  . TURMERIC CURCUMIN PO Take 1 tablet by mouth daily.     No current facility-administered medications for this visit.    Allergies:   Aspirin; Atorvastatin; and Cephalexin   Past Medical History  Diagnosis Date  . HTN (hypertension)     Last echo 1/07:  Normal LVF, LV thickness upper limits of normal, mild aortic root dilatation, mild to mod MAC, mild LAE, borderline RVH, mild RAE.  . Tobacco abuse   . Ethanolism   . COPD (chronic obstructive pulmonary disease)   . Unilateral congenital absence of kidney   .  CAD (coronary artery disease)     s/p stent to OM2 in past;  Last LHC 3/05: EF 60%, left RA occluded, right RA ok, prox to mid LAD 60%, oD1 75-80%, oD2 small 75%, pOM1 50%, OM2 stent ok with 50% before stent, mRCA 30%, dRCA 30%, PDA 70%.  Last dobutamine myoview 5/06: no scar or ischemia, EF 60%.  Marland Kitchen HLD (hyperlipidemia)   . PAD (peripheral artery disease)   . S/P AAA repair   . Ascending aortic aneurysm     Dr. Roxan Hockey  . DJD (degenerative joint disease)   . GERD (gastroesophageal reflux disease)     Past Surgical History  Procedure Laterality Date  . Appendectomy    . Hernia repair    . Resection and grafting of infarenal  abdominal  aortic aneurysm  05/03/2002  . Carpal tunnel release Right 12/26/2014    Procedure: RIGHT ENDOSCOPIC CARPAL TUNNEL SYNDROME RELEASE;  Surgeon: Milly Jakob,  MD;  Location: [facility];  Service: Orthopedics;  Laterality: Right;     Social History:  The patient  reports that he quit smoking about 18 years ago. He has never used smokeless tobacco. He reports that he drinks alcohol. He reports that he does not use illicit drugs.   Family History:  The patient's family history includes COPD in his brother and sister; Cancer in his brother, mother, and sister.    ROS:  Please see the history of present illness. All other systems are reviewed and  Negative to the above problem except as noted.    PHYSICAL EXAM: VS:  BP 130/62 mmHg  Pulse 53  Ht 5\' 4"  (1.626 m)  Wt 195 lb 8 oz (88.678 kg)  BMI 33.54 kg/m2  SpO2 93%  GEN: Well nourished, well developed, in no acute distress HEENT: normal Neck: no JVD, carotid bruits, or masses Cardiac: RRR; no murmurs, rubs, or gallops,no edema  Respiratory:  clear to auscultation bilaterally, normal work of breathing GI: soft, nontender, nondistended, + BS  No hepatomegaly  MS: no deformity Moving all extremities   Skin: warm and dry, no rash Neuro:  Strength and sensation are intact Psych: euthymic mood, full affect   EKG:  EKG is not ordered today.   Lipid Panel    Component Value Date/Time   CHOL 141 09/28/2014 1410   CHOL 125 05/03/2013 1438   CHOL 127 09/05/2011 1029   TRIG 78 09/28/2014 1410   TRIG 29 09/05/2011 1029   HDL 47 09/28/2014 1410   HDL 40.10 05/03/2013 1438   HDL 51 09/05/2011 1029   CHOLHDL 3.0 09/28/2014 1410   CHOLHDL 3 05/03/2013 1438   VLDL 19.8 05/03/2013 1438   LDLCALC 78 09/28/2014 1410   LDLCALC 65 05/03/2013 1438   LDLCALC 70 09/05/2011 1029      Wt Readings from Last 3 Encounters:  01/16/15 195 lb 8 oz (88.678 kg)  01/02/15 198 lb 6.4 oz (89.994 kg)  12/26/14 197 lb (89.359 kg)      ASSESSMENT AND PLAN:  1.  Bradycardia  No Symptoms  2.  CAD  No active angina  3.  HL  Excellent control  F/U in Feb     Signed, Dorris Carnes, MD    01/16/2015 5:53 PM    Brad [name], [place], [place]  [address] Phone: [phone]; Fax: [phone]

## 2015-01-31 DIAGNOSIS — H3531 Nonexudative age-related macular degeneration: Secondary | ICD-10-CM | POA: Diagnosis not present

## 2015-01-31 DIAGNOSIS — I10 Essential (primary) hypertension: Secondary | ICD-10-CM | POA: Diagnosis not present

## 2015-01-31 DIAGNOSIS — E780 Pure hypercholesterolemia: Secondary | ICD-10-CM | POA: Diagnosis not present

## 2015-01-31 DIAGNOSIS — Z961 Presence of intraocular lens: Secondary | ICD-10-CM | POA: Diagnosis not present

## 2015-02-17 ENCOUNTER — Ambulatory Visit (INDEPENDENT_AMBULATORY_CARE_PROVIDER_SITE_OTHER): Payer: Medicare Other | Admitting: Pharmacist

## 2015-02-17 ENCOUNTER — Encounter: Payer: Self-pay | Admitting: Pharmacist

## 2015-02-17 VITALS — BP 120/62 | HR 60 | Ht 64.0 in | Wt 200.0 lb

## 2015-02-17 DIAGNOSIS — Z23 Encounter for immunization: Secondary | ICD-10-CM

## 2015-02-17 DIAGNOSIS — Z1211 Encounter for screening for malignant neoplasm of colon: Secondary | ICD-10-CM

## 2015-02-17 NOTE — Progress Notes (Addendum)
Patient ID: Brad [name], male   DOB: 1927-11-02, 81 y.o.   MRN: [id number]    Subjective:   Brad [name] is a 81 y.o. white male who presents for an Initial Medicare Annual Wellness Visit.  He is accompanied by his son Brad [name].  Brad [name] lives alone in [place], [place] and his other son lives next door.  Brad [name] lost his wife about 1 year ago.   His only health concern is occassional pain in his right wrist which has improved since surgery for carpel tunnel syndrome.  He has follow up scheduled with surgeron Dr Brad [name] in 2-3 weeks.    Current Medications (verified) Outpatient Encounter Prescriptions as of 02/17/2015  Medication Sig  . amLODipine (NORVASC) 5 MG tablet Take 1 tablet (5 mg total) by mouth daily.  . celecoxib (CELEBREX) 200 MG capsule Take 1 capsule (200 mg total) by mouth daily. With food. For joint pain  . clobetasol (TEMOVATE) 0.05 % external solution Apply 2-3 drops to AA of scalp BID  . clopidogrel (PLAVIX) 75 MG tablet Take 75 mg by mouth daily.  . fish oil-omega-3 fatty acids 1000 MG capsule Take 1 g by mouth daily.    . fluocinolone (VANOS) 0.01 % cream Apply 1 application topically daily.  . furosemide (LASIX) 20 MG tablet Take 1 tablet (20 mg total) by mouth daily.  . isosorbide mononitrate (IMDUR) 30 MG 24 hr tablet TAKE 1 TABLET BY MOUTH ONCE A DAY  . ketoconazole (NIZORAL) 2 % shampoo Use 2-3 times weekly. Leave on for 5 min. Rinse.  . loratadine (CLARITIN) 10 MG tablet Take 1 tablet (10 mg total) by mouth daily.  . Multiple Vitamins-Minerals (PRESERVISION AREDS PO) Take 1 tablet by mouth daily.  . potassium chloride SA (K-DUR,KLOR-CON) 20 MEQ tablet Take 1 tablet (20 mEq total) by mouth daily.  . rosuvastatin (CRESTOR) 10 MG tablet Take 10 mg by mouth daily. Patient taking 1/2 pill daily  . tamsulosin (FLOMAX) 0.4 MG CAPS capsule Take 2 capsules (0.8 mg total) by mouth at bedtime.  . triamcinolone cream (KENALOG) 0.1 % Apply 1 application topically 2 (two)  times daily. Apply to AA of trunk, arms, legs. Avoid underarms, genitals, face, groin.  . TURMERIC CURCUMIN PO Take 1 tablet by mouth daily.   No facility-administered encounter medications on file as of 02/17/2015.    Allergies (verified) Aspirin; Atorvastatin; and Cephalexin   History: Past Medical History  Diagnosis Date  . HTN (hypertension)     Last echo 1/07:  Normal LVF, LV thickness upper limits of normal, mild aortic root dilatation, mild to mod MAC, mild LAE, borderline RVH, mild RAE.  . Tobacco abuse   . Ethanolism   . COPD (chronic obstructive pulmonary disease)   . Unilateral congenital absence of kidney   . CAD (coronary artery disease)     s/p stent to OM2 in past;  Last LHC 3/05: EF 60%, left RA occluded, right RA ok, prox to mid LAD 60%, oD1 75-80%, oD2 small 75%, pOM1 50%, OM2 stent ok with 50% before stent, mRCA 30%, dRCA 30%, PDA 70%.  Last dobutamine myoview 5/06: no scar or ischemia, EF 60%.  Marland Kitchen HLD (hyperlipidemia)   . PAD (peripheral artery disease)   . S/P AAA repair   . Ascending aortic aneurysm     Dr. Roxan Hockey  . DJD (degenerative joint disease)   . GERD (gastroesophageal reflux disease)   . Hernia, abdominal   . Macular degeneration  Past Surgical History  Procedure Laterality Date  . Appendectomy    . Hernia repair    . Resection and grafting of infarenal  abdominal  aortic aneurysm  05/03/2002  . Carpal tunnel release Right 12/26/2014    Procedure: RIGHT ENDOSCOPIC CARPAL TUNNEL SYNDROME RELEASE;  Surgeon: Brad Jakob, MD;  Location: [facility];  Service: Orthopedics;  Laterality: Right;   Family History  Problem Relation Age of Onset  . Cancer Mother   . Stroke Mother     with hemi plegia  . Cancer Sister   . COPD Sister   . COPD Brother   . Cancer Brother     Lip  . Early death Sister    Social History   Occupational History  . Not on file.   Social History Main Topics  . Smoking status: Former Smoker    Quit  date: 05/06/1996  . Smokeless tobacco: Never Used  . Alcohol Use: No  . Drug Use: No  . Sexual Activity: No    Do you feel safe at home?  Yes  Dietary issues and exercise activities discussed: Current Exercise Habits:: Exercise is limited by  Current Dietary habits:  Not following any particular diet    Cardiac Risk Factors include: advanced age (>3men, >82 women);male gender;obesity (BMI >30kg/m2);dyslipidemia  Objective:    Today's Vitals   02/17/15 1219  BP: 120/62  Pulse: 60  Height: 5\' 4"  (1.626 m)  Weight: 200 lb (90.719 kg)  PainSc: 2   PainLoc: Wrist   Body mass index is 34.31 kg/(m^2).   Activities of Daily Living In your present state of health, do you have any difficulty performing the following activities: 02/17/2015 12/26/2014  Hearing? Y N  Vision? N Y  Difficulty concentrating or making decisions? N N  Walking or climbing stairs? N N  Dressing or bathing? N N  Doing errands, shopping? N -  Preparing Food and eating ? N -  Using the Toilet? N -  In the past six months, have you accidently leaked urine? N -  Do you have problems with loss of bowel control? N -  Managing your Medications? N -  Managing your Finances? N -  Housekeeping or managing your Housekeeping? N -    Are there smokers in your home (other than you)? No    Depression Screen PHQ 2/9 Scores 02/17/2015 09/28/2014 06/29/2014 11/09/2013  PHQ - 2 Score 0 0 0 0    Fall Risk Fall Risk  02/17/2015 09/28/2014 11/09/2013  Falls in the past year? Yes Yes No  Number falls in past yr: 1 2 or more -  Injury with Fall? No No -  Risk for fall due to : - Impaired balance/gait -  Follow up Falls evaluation completed;Falls prevention discussed - -    Cognitive Function: MMSE - Mini Mental State Exam 02/17/2015  Orientation to time 5  Orientation to Place 5  Registration 3  Attention/ Calculation 5  Recall 2  Language- name 2 objects 2  Language- repeat 1  Language- follow 3 step command 3    Language- read & follow direction 1  Write a sentence 1  Copy design 0  Total score 28    Immunizations and Health Maintenance Immunization History  Administered Date(s) Administered  . Influenza, High Dose Seasonal PF 01/24/2014  . Influenza,inj,Quad PF,36+ Mos 03/04/2013, 02/17/2015  . Pneumococcal Conjugate-13 02/17/2015   Health Maintenance Due  Topic Date Due  . TETANUS/TDAP  06/26/1948  . ZOSTAVAX  06/26/1989  . INFLUENZA VACCINE  12/19/2014    Patient Care Team: Chipper Herb, MD as PCP - General (Family Medicine) Fay Records, MD (Cardiology)   Dr Adella Nissen - optometrist - specialist for low vision  I   Assessment:    Annual Wellness Visit    Screening Tests Health Maintenance  Topic Date Due  . TETANUS/TDAP  06/26/1948  . ZOSTAVAX  06/26/1989  . INFLUENZA VACCINE  12/19/2014  . PNA vac Low Risk Adult (2 of 2 - PPSV23) 02/17/2016        Plan:   During the course of the visit Brad [name] was educated and counseled about the following appropriate screening and preventive services:   Vaccines to include Pneumoccal, Influenza, Hepatitis B, Td, Zostavax - patient received Prevnar 13 and influenza vaccine in office today.  Checkd price for Zostavax ($120) and Bootrix ($30).  Will hold off on both of those for now.   Colorectal cancer screening - FOBT given inoffice today  Cardiovascular disease screening- sees Dr Harrington Challenger regularly.  Lipids last checked 09/28/2014 - taking statin.   BP was at goal today.    Diabetes screening - thre was a RBG in hospital that was 160 but other BG in hospital was WNL.    Glaucoma screening / Eye Exam - UTD  Nutrition counseling - discussed limiting high CHO foods;    Increase physcial activity - recommended with [facility] at [facility] or PT here with [place].  Son will look in these options  HOH - discussed having hearing checked - suggested hearing assessment with Dr Romeo Rabon.  Son is aware and will consider  appt.   Advanced directives - UTD    Patient Instructions (the written plan) were given to the patient.   Cherre Robins, [facility]   02/17/2015        I have reviewed and agree with the above AWV documentation.  [name], M.D.

## 2015-02-17 NOTE — Addendum Note (Signed)
Addended by: [name] on: 02/17/2015 02:15 PM   Modules accepted: Miquel Dunn

## 2015-02-17 NOTE — Patient Instructions (Signed)
  Mr. Kropf , Thank you for taking time to come for your Medicare Wellness Visit. I appreciate your ongoing commitment to your health goals. Please review the following plan we discussed and let me know if I can assist you in the future.   These are the goals we discussed:  Increase exercise - start with 10 to 15 minutes 3 to 4 days per week and increase as able to goal of 30 to 40 minutes.    Increase non-starchy vegetables - carrots, green bean, squash, zucchini, tomatoes, onions, peppers, spinach and other green leafy vegetables, cabbage, lettuce, cucumbers, asparagus, okra (not fried), eggplant limit sugar and processed foods (cakes, cookies, ice cream, crackers and chips) Increase fresh fruit but limit serving sizes 1/2 cup or about the size of tennis or baseball limit red meat to no more than 1-2 times per week (serving size about the size of your palm) Choose whole grains / lean proteins - whole wheat bread, quinoa, whole grain rice (1/2 cup), fish, chicken, [place]    This is a list of the screening recommended for you and due dates:  Health Maintenance  Topic Date Due  . Tetanus Vaccine  Can get anytime now - cost $30  . Shingles Vaccine  06/26/1989 - cost $120  . Pneumonia vaccines (1 of 2 - PCV13) Done today  . Flu Shot  Done today

## 2015-02-20 ENCOUNTER — Other Ambulatory Visit: Payer: Medicare Other

## 2015-02-20 DIAGNOSIS — Z1212 Encounter for screening for malignant neoplasm of rectum: Secondary | ICD-10-CM | POA: Diagnosis not present

## 2015-02-22 LAB — FECAL OCCULT BLOOD, IMMUNOCHEMICAL: Fecal Occult Bld: NEGATIVE

## 2015-03-13 DIAGNOSIS — R351 Nocturia: Secondary | ICD-10-CM | POA: Diagnosis not present

## 2015-03-13 DIAGNOSIS — R3912 Poor urinary stream: Secondary | ICD-10-CM | POA: Diagnosis not present

## 2015-03-17 ENCOUNTER — Other Ambulatory Visit: Payer: Self-pay | Admitting: Thoracic Surgery (Cardiothoracic Vascular Surgery)

## 2015-03-17 DIAGNOSIS — I7121 Aneurysm of the ascending aorta, without rupture: Secondary | ICD-10-CM

## 2015-03-17 DIAGNOSIS — I712 Thoracic aortic aneurysm, without rupture: Secondary | ICD-10-CM

## 2015-03-20 ENCOUNTER — Other Ambulatory Visit: Payer: Self-pay | Admitting: Thoracic Surgery (Cardiothoracic Vascular Surgery)

## 2015-03-20 DIAGNOSIS — I7121 Aneurysm of the ascending aorta, without rupture: Secondary | ICD-10-CM

## 2015-03-20 DIAGNOSIS — I712 Thoracic aortic aneurysm, without rupture: Secondary | ICD-10-CM

## 2015-03-29 ENCOUNTER — Other Ambulatory Visit: Payer: Self-pay | Admitting: Thoracic Surgery (Cardiothoracic Vascular Surgery)

## 2015-03-29 DIAGNOSIS — I712 Thoracic aortic aneurysm, without rupture: Secondary | ICD-10-CM | POA: Diagnosis not present

## 2015-03-29 LAB — CREATININE, ISTAT: Creatinine, IStat: 0.8 mg/dL (ref 0.6–1.3)

## 2015-04-01 ENCOUNTER — Other Ambulatory Visit: Payer: Self-pay | Admitting: Family Medicine

## 2015-04-04 ENCOUNTER — Ambulatory Visit: Payer: BLUE CROSS/BLUE SHIELD | Admitting: Thoracic Surgery (Cardiothoracic Vascular Surgery)

## 2015-04-04 ENCOUNTER — Other Ambulatory Visit: Payer: BLUE CROSS/BLUE SHIELD

## 2015-04-05 DIAGNOSIS — G5601 Carpal tunnel syndrome, right upper limb: Secondary | ICD-10-CM | POA: Diagnosis not present

## 2015-04-11 ENCOUNTER — Encounter: Payer: Self-pay | Admitting: Thoracic Surgery (Cardiothoracic Vascular Surgery)

## 2015-04-11 ENCOUNTER — Ambulatory Visit (INDEPENDENT_AMBULATORY_CARE_PROVIDER_SITE_OTHER): Payer: Medicare Other | Admitting: Thoracic Surgery (Cardiothoracic Vascular Surgery)

## 2015-04-11 ENCOUNTER — Ambulatory Visit
Admission: RE | Admit: 2015-04-11 | Discharge: 2015-04-11 | Disposition: A | Discharge status: Home / Self Care | Payer: Medicare Other | Source: Ambulatory Visit | Attending: Thoracic Surgery (Cardiothoracic Vascular Surgery) | Admitting: Thoracic Surgery (Cardiothoracic Vascular Surgery)

## 2015-04-11 VITALS — BP 117/70 | HR 64 | Resp 20 | Ht 64.0 in | Wt 203.0 lb

## 2015-04-11 DIAGNOSIS — I712 Thoracic aortic aneurysm, without rupture: Secondary | ICD-10-CM | POA: Diagnosis not present

## 2015-04-11 DIAGNOSIS — I7121 Aneurysm of the ascending aorta, without rupture: Secondary | ICD-10-CM

## 2015-04-11 DIAGNOSIS — I2510 Atherosclerotic heart disease of native coronary artery without angina pectoris: Secondary | ICD-10-CM | POA: Diagnosis not present

## 2015-04-11 DIAGNOSIS — I2584 Coronary atherosclerosis due to calcified coronary lesion: Secondary | ICD-10-CM

## 2015-04-11 MED ORDER — IOPAMIDOL (ISOVUE-370) INJECTION 76%
75.0000 mL | Freq: Once | INTRAVENOUS | Status: AC | PRN
  Administered 2015-04-11: 75 mL via INTRAVENOUS

## 2015-04-11 NOTE — Progress Notes (Signed)
[address]       [place] [address]             [phone]       HPI: [name] returns for a 1 year follow-up visit  I have been following [name] dating back to 2003. Over time the aneurysm has grown slightly. I last saw him in the office a year ago. The aneurysm was 4.6 cm and was unchanged. There were some areas where the aneurysm measured is much is 5 cm, but these were likely tangential rather than true cross-sectional measurements.  In the interim since his last visit a year ago, he has not had any new cardiac issues. He is not as active as he was a year ago. He had carpal tunnel surgery recently and has not been able to do much fishing. He denies any chest pain. He gets short of breath easily. He fatigues easily. He does have issues with swelling in his legs.  Past Medical History  Diagnosis Date  . HTN (hypertension)     Last echo 1/07:  Normal LVF, LV thickness upper limits of normal, mild aortic root dilatation, mild to mod MAC, mild LAE, borderline RVH, mild RAE.  . Tobacco abuse   . Ethanolism ([place])   . COPD (chronic obstructive pulmonary disease) ([facility])   . Unilateral congenital absence of kidney   . CAD (coronary artery disease)     s/p stent to OM2 in past;  Last LHC 3/05: EF 60%, left RA occluded, right RA ok, prox to mid LAD 60%, oD1 75-80%, oD2 small 75%, pOM1 50%, OM2 stent ok with 50% before stent, mRCA 30%, dRCA 30%, PDA 70%.  Last dobutamine myoview 5/06: no scar or ischemia, EF 60%.  Marland Kitchen HLD (hyperlipidemia)   . PAD (peripheral artery disease) ([facility])   . S/P AAA repair   . Ascending aortic aneurysm ([facility])     Dr. Roxan [name]  . DJD (degenerative joint disease)   . GERD (gastroesophageal reflux disease)   . Hernia, abdominal   . Macular degeneration       Current Outpatient Prescriptions  Medication Sig Dispense Refill  . amLODipine (NORVASC) 5 MG tablet Take 1 tablet (5 mg total) by mouth daily. 30 tablet 11  . celecoxib  (CELEBREX) 200 MG capsule TAKE 1 CAPSULE (200 MG TOTAL) BY MOUTH DAILY. WITH FOOD. FOR JOINT PAIN 30 capsule 0  . clobetasol (TEMOVATE) 0.05 % external solution Apply 2-3 drops to AA of scalp BID 50 mL 4  . clopidogrel (PLAVIX) 75 MG tablet Take 75 mg by mouth daily.    . fish oil-omega-3 fatty acids 1000 MG capsule Take 1 g by mouth daily.      . fluocinolone (VANOS) 0.01 % cream Apply 1 application topically daily.    . furosemide (LASIX) 20 MG tablet Take 1 tablet (20 mg total) by mouth daily. 30 tablet 11  . isosorbide mononitrate (IMDUR) 30 MG 24 hr tablet TAKE 1 TABLET BY MOUTH ONCE A DAY 30 tablet 3  . ketoconazole (NIZORAL) 2 % shampoo Use 2-3 times weekly. Leave on for 5 min. Rinse. 120 mL 0  . loratadine (CLARITIN) 10 MG tablet Take 1 tablet (10 mg total) by mouth daily. 30 tablet 11  . Multiple Vitamins-Minerals (PRESERVISION AREDS PO) Take 1 tablet by mouth daily.    . potassium chloride SA (K-DUR,KLOR-CON) 20 MEQ tablet Take 1 tablet (20 mEq total) by mouth daily. 30 tablet 11  .  rosuvastatin (CRESTOR) 10 MG tablet Take 10 mg by mouth daily. Patient taking 1/2 pill daily    . tamsulosin (FLOMAX) 0.4 MG CAPS capsule Take 2 capsules (0.8 mg total) by mouth at bedtime. 180 capsule 3  . triamcinolone cream (KENALOG) 0.1 % Apply 1 application topically 2 (two) times daily. Apply to AA of trunk, arms, legs. Avoid underarms, genitals, face, groin. 453.6 g 3  . TURMERIC CURCUMIN PO Take 1 tablet by mouth daily.     No current facility-administered medications for this visit.    Physical Exam BP 117/70 mmHg  Pulse 64  Resp 20  Ht 5\' 4"  (1.626 m)  Wt 203 lb (92.08 kg)  BMI 34.83 kg/m2  SpO2 95% Elderly gentleman in no acute distress Overweight Alert and oriented 3 with no focal neurologic deficits Cardiac regular rate and rhythm with a 2/6 systolic murmur Lungs diminished breath sounds bilaterally, no rales or wheezing 2+ peripheral edema bilaterally Truss in place for ventral  hernias.   Diagnostic Tests: CT ANGIOGRAPHY CHEST WITH CONTRAST  TECHNIQUE: Multidetector CT imaging of the chest was performed using the standard protocol during bolus administration of intravenous contrast. Multiplanar CT image reconstructions and MIPs were obtained to evaluate the vascular anatomy.  CONTRAST: 75 cc Isovue 370  COMPARISON: 04/12/2014  FINDINGS: Mediastinum/Lymph Nodes: Stable calcific atherosclerosis of the tortuous thoracic aorta. Ascending thoracic aorta demonstrates similar fusiform aneurysmal dilatation. Proximal ascending thoracic aorta measures 5.2 cm in AP dimension and 5 cm transverse, previous maximal dimension was 4.6 cm. Stable small lateral penetrating atherosclerotic ulcer of the aortic isthmus, approximately 9 mm. Stable calcific atherosclerosis without aneurysm dilatation of the descending thoracic aorta. No mediastinal hemorrhage or hematoma. No wall thickening or intramural hemorrhage appreciated. Stable tortuosity and mild dilatation of the major branch vessels.  Slight increased precarinal and subcarinal lymph nodes, index precarinal lymph node measures 13 mm in short axis, previously 9 mm. These are likely reactive or inflammatory.  Lungs/Pleura: Lungs demonstrate mixed paraseptal and centrilobular emphysema pattern with hyperinflation, most pronounced in the upper lobes. Stable 4 mm nodule in the medial right upper lobe, image 23. Stable medial scarring in the right lower lobe and the inferior lingula, image 43. Minor dependent basilar atelectasis. No acute pneumonia, collapse or consolidation. No interstitial process or edema. Negative for pneumothorax. Trachea and central airways remain patent.  Upper abdomen: Included upper abdomen demonstrates no acute process. Abdominal atherosclerosis noted.  Musculoskeletal: Diffuse thoracic degenerative changes. Upper thoracic vertebral hemangioma present, stable. No acute  compression fracture. Sternum intact.  Review of the MIP images confirms the above findings.  IMPRESSION: Slight measurable increase in fusiform aneurysmal dilatation of the ascending thoracic aorta, maximal diameter 5.2 cm, previously 4.6 cm.  Stable 9 mm penetrating atherosclerotic ulcer of the aortic isthmus.  Extensive aortic and coronary atherosclerosis  Diffuse emphysema pattern and stable 4 mm right upper lobe nodule  No superimposed acute chest process.  Slightly more prominent precarinal and subcarinal lymph nodes, suspect reactive or inflammatory.   Electronically Signed  By: Jerilynn Mages. Shick M.D.  On: 04/11/2015 15:15  I personally reviewed the CT scan and compared it to the film from 2015. It clearly is being measured in a different manner than it was previously. When measured in the same directions the aneurysm is within 1 mm of the previous size as opposed to the 6 mm that is indicated in the radiologist reading. In my opinion there has been slight interval growth.  Impression: 81 year old man with a known ascending  aortic aneurysm. This appears to have grown minimally over the past year. I disagree with the radiologist's assessment as to the change in size. It is much less dramatic than his report would indicate. Given [name]'s age, physical status, and associated comorbidities he is a poor candidate for surgery. There is no definite indication for surgery at this time. I recommend continued annual follow-up.  Plan: Return in one year with CT angiogram of chest.  Melrose Nakayama, MD [facility] [phone]

## 2015-04-22 ENCOUNTER — Other Ambulatory Visit: Payer: Self-pay | Admitting: Family Medicine

## 2015-05-06 ENCOUNTER — Other Ambulatory Visit: Payer: Self-pay | Admitting: Family Medicine

## 2015-05-13 ENCOUNTER — Other Ambulatory Visit: Payer: Self-pay | Admitting: Internal Medicine

## 2015-05-23 ENCOUNTER — Other Ambulatory Visit: Payer: Self-pay

## 2015-05-23 DIAGNOSIS — I10 Essential (primary) hypertension: Secondary | ICD-10-CM

## 2015-05-23 DIAGNOSIS — I2584 Coronary atherosclerosis due to calcified coronary lesion: Secondary | ICD-10-CM

## 2015-05-23 DIAGNOSIS — I2510 Atherosclerotic heart disease of native coronary artery without angina pectoris: Secondary | ICD-10-CM

## 2015-05-23 MED ORDER — POTASSIUM CHLORIDE CRYS ER 20 MEQ PO TBCR: 20.0000 meq | EXTENDED_RELEASE_TABLET | Freq: Every day | ORAL | Status: DC

## 2015-05-23 MED ORDER — AMLODIPINE BESYLATE 5 MG PO TABS: 5.0000 mg | ORAL_TABLET | Freq: Every day | ORAL | Status: DC

## 2015-07-07 ENCOUNTER — Ambulatory Visit (INDEPENDENT_AMBULATORY_CARE_PROVIDER_SITE_OTHER): Payer: Medicare Other

## 2015-07-07 ENCOUNTER — Encounter: Payer: Self-pay | Admitting: Family Medicine

## 2015-07-07 ENCOUNTER — Ambulatory Visit (INDEPENDENT_AMBULATORY_CARE_PROVIDER_SITE_OTHER): Payer: Medicare Other | Admitting: Family Medicine

## 2015-07-07 VITALS — BP 107/71 | HR 72 | Temp 97.1°F | Ht 64.0 in | Wt 200.0 lb

## 2015-07-07 DIAGNOSIS — I25118 Atherosclerotic heart disease of native coronary artery with other forms of angina pectoris: Secondary | ICD-10-CM | POA: Diagnosis not present

## 2015-07-07 DIAGNOSIS — I2510 Atherosclerotic heart disease of native coronary artery without angina pectoris: Secondary | ICD-10-CM | POA: Diagnosis not present

## 2015-07-07 DIAGNOSIS — R3989 Other symptoms and signs involving the genitourinary system: Secondary | ICD-10-CM | POA: Diagnosis not present

## 2015-07-07 DIAGNOSIS — R1010 Upper abdominal pain, unspecified: Secondary | ICD-10-CM

## 2015-07-07 DIAGNOSIS — E785 Hyperlipidemia, unspecified: Secondary | ICD-10-CM

## 2015-07-07 DIAGNOSIS — R109 Unspecified abdominal pain: Secondary | ICD-10-CM

## 2015-07-07 DIAGNOSIS — N400 Enlarged prostate without lower urinary tract symptoms: Secondary | ICD-10-CM | POA: Diagnosis not present

## 2015-07-07 DIAGNOSIS — I2584 Coronary atherosclerosis due to calcified coronary lesion: Secondary | ICD-10-CM

## 2015-07-07 DIAGNOSIS — I10 Essential (primary) hypertension: Secondary | ICD-10-CM

## 2015-07-07 DIAGNOSIS — L409 Psoriasis, unspecified: Secondary | ICD-10-CM | POA: Diagnosis not present

## 2015-07-07 DIAGNOSIS — R10A0 Flank pain, unspecified side: Secondary | ICD-10-CM

## 2015-07-07 LAB — POCT URINALYSIS DIPSTICK
Bilirubin, UA: NEGATIVE
GLUCOSE UA: NEGATIVE
Ketones, UA: NEGATIVE
Leukocytes, UA: NEGATIVE
NITRITE UA: NEGATIVE
Protein, UA: NEGATIVE
RBC UA: NEGATIVE
Spec Grav, UA: 1.01
Urobilinogen, UA: NEGATIVE
pH, UA: 5

## 2015-07-07 LAB — POCT UA - MICROSCOPIC ONLY
BACTERIA, U MICROSCOPIC: NEGATIVE
Casts, Ur, LPF, POC: NEGATIVE
Crystals, Ur, HPF, POC: NEGATIVE
MUCUS UA: NEGATIVE
RBC, urine, microscopic: NEGATIVE
WBC, UR, HPF, POC: NEGATIVE
Yeast, UA: NEGATIVE

## 2015-07-07 MED ORDER — KETOCONAZOLE 2 % EX SHAM: MEDICATED_SHAMPOO | CUTANEOUS | Status: DC

## 2015-07-07 MED ORDER — POTASSIUM CHLORIDE CRYS ER 20 MEQ PO TBCR: 20.0000 meq | EXTENDED_RELEASE_TABLET | Freq: Every day | ORAL | Status: DC

## 2015-07-07 MED ORDER — CLOBETASOL PROPIONATE 0.05 % EX SOLN: CUTANEOUS | Status: DC

## 2015-07-07 MED ORDER — CELECOXIB 200 MG PO CAPS: ORAL_CAPSULE | ORAL | Status: DC

## 2015-07-07 MED ORDER — CLOPIDOGREL BISULFATE 75 MG PO TABS: 75.0000 mg | ORAL_TABLET | Freq: Every day | ORAL | Status: DC

## 2015-07-07 MED ORDER — FUROSEMIDE 20 MG PO TABS: 20.0000 mg | ORAL_TABLET | Freq: Every day | ORAL | Status: DC

## 2015-07-07 MED ORDER — AMLODIPINE BESYLATE 5 MG PO TABS: 5.0000 mg | ORAL_TABLET | Freq: Every day | ORAL | Status: DC

## 2015-07-07 MED ORDER — TAMSULOSIN HCL 0.4 MG PO CAPS: 0.8000 mg | ORAL_CAPSULE | Freq: Every day | ORAL | Status: DC

## 2015-07-07 MED ORDER — ROSUVASTATIN CALCIUM 10 MG PO TABS: 10.0000 mg | ORAL_TABLET | Freq: Every day | ORAL | Status: DC

## 2015-07-07 MED ORDER — ISOSORBIDE MONONITRATE ER 30 MG PO TB24: 30.0000 mg | ORAL_TABLET | Freq: Every day | ORAL | Status: DC

## 2015-07-07 NOTE — Progress Notes (Signed)
Subjective:  Patient ID: Brad [name], male    DOB: [birth year]  Age: 82 y.o. MRN: [id number]  CC: 6 month follow up   HPI Brad [name] presents for  follow-up of hypertension. Patient has no history of headache chest pain or shortness of breath or recent cough. Patient also denies symptoms of TIA such as numbness weakness lateralizing. Patient checks  blood pressure at home and has not had any elevated readings recently. Patient denies side effects from his medication. States taking it regularly.  Patient also  in for follow-up of elevated cholesterol. Doing well without complaints on current medication. Denies side effects of statin including myalgia and arthralgia and nausea. Also in today for liver function testing. Currently no chest pain, shortness of breath or other cardiovascular related symptoms noted  Pain at left flank. Moderate - different from his back pain that is chronic. Dull ache moderate intensity. INtermittent, but frequent. No radiation to groin, LLQ   History Brad [name] has a past medical history of HTN (hypertension); Tobacco abuse; Ethanolism ([place]); COPD (chronic obstructive pulmonary disease) ([place]); Unilateral congenital absence of kidney; CAD (coronary artery disease); HLD (hyperlipidemia); PAD (peripheral artery disease) (HCC); S/P AAA repair; Ascending aortic aneurysm (HCC); DJD (degenerative joint disease); GERD (gastroesophageal reflux disease); Hernia, abdominal; and Macular degeneration.   He has past surgical history that includes Appendectomy; Hernia repair; RESECTION AND GRAFTING OF INFARENAL  ABDOMINAL  AORTIC ANEURYSM (05/03/2002); and Carpal tunnel release (Right, 12/26/2014).   His family history includes COPD in his brother and sister; Cancer in his brother, mother, and sister; Early death in his sister; Stroke in his mother.He reports that he quit smoking about 19 years ago. He has never used smokeless tobacco. He reports that he does not drink alcohol or use  illicit drugs.  Current Outpatient Prescriptions on File Prior to Visit  Medication Sig Dispense Refill  . fish oil-omega-3 fatty acids 1000 MG capsule Take 1 g by mouth daily.      . fluocinolone (VANOS) 0.01 % cream Apply 1 application topically daily.    Marland Kitchen loratadine (CLARITIN) 10 MG tablet Take 1 tablet (10 mg total) by mouth daily. 30 tablet 11  . Multiple Vitamins-Minerals (PRESERVISION AREDS PO) Take 1 tablet by mouth daily.    Marland Kitchen triamcinolone cream (KENALOG) 0.1 % Apply 1 application topically 2 (two) times daily. Apply to AA of trunk, arms, legs. Avoid underarms, genitals, face, groin. 453.6 g 3  . TURMERIC CURCUMIN PO Take 1 tablet by mouth daily.     No current facility-administered medications on file prior to visit.    ROS Review of Systems  Objective:  BP 107/71 mmHg  Pulse 72  Temp(Src) 97.1 F (36.2 C) (Oral)  Ht 5\' 4"  (1.626 m)  Wt 200 lb (90.719 kg)  BMI 34.31 kg/m2  BP Readings from Last 3 Encounters:  07/07/15 107/71  04/11/15 117/70  02/17/15 120/62    Wt Readings from Last 3 Encounters:  07/07/15 200 lb (90.719 kg)  04/11/15 203 lb (92.08 kg)  02/17/15 200 lb (90.719 kg)     Physical Exam  No results found for: HGBA1C  Lab Results  Component Value Date   WBC 6.3 10/05/2014   HGB 13.1 12/26/2014   HCT 47.0 10/05/2014   PLT 180 10/05/2014   GLUCOSE 160* 12/23/2014   CHOL 141 09/28/2014   TRIG 78 09/28/2014   HDL 47 09/28/2014   LDLCALC 78 09/28/2014   ALT 9 09/28/2014   AST 12 09/28/2014  NA 140 12/23/2014   K 4.4 12/23/2014   CL 105 12/23/2014   CREATININE 0.78 12/23/2014   BUN 16 12/23/2014   CO2 27 12/23/2014   TSH 0.49 05/02/2014    Ct Angio Chest Aorta W/cm &/or Wo/cm  04/11/2015  CLINICAL DATA:  Ascending aortic aneurysm status post repair. shortness of breath. Weight loss. EXAM: CT ANGIOGRAPHY CHEST WITH CONTRAST TECHNIQUE: Multidetector CT imaging of the chest was performed using the standard protocol during bolus  administration of intravenous contrast. Multiplanar CT image reconstructions and MIPs were obtained to evaluate the vascular anatomy. CONTRAST:  75 cc Isovue 370 COMPARISON:  04/12/2014 FINDINGS: Mediastinum/Lymph Nodes: Stable calcific atherosclerosis of the tortuous thoracic aorta. Ascending thoracic aorta demonstrates similar fusiform aneurysmal dilatation. Proximal ascending thoracic aorta measures 5.2 cm in AP dimension and 5 cm transverse, previous maximal dimension was 4.6 cm. Stable small lateral penetrating atherosclerotic ulcer of the aortic isthmus, approximately 9 mm. Stable calcific atherosclerosis without aneurysm dilatation of the descending thoracic aorta. No mediastinal hemorrhage or hematoma. No wall thickening or intramural hemorrhage appreciated. Stable tortuosity and mild dilatation of the major branch vessels. Slight increased precarinal and subcarinal lymph nodes, index precarinal lymph node measures 13 mm in short axis, previously 9 mm. These are likely reactive or inflammatory. Lungs/Pleura: Lungs demonstrate mixed paraseptal and centrilobular emphysema pattern with hyperinflation, most pronounced in the upper lobes. Stable 4 mm nodule in the medial right upper lobe, image 23. Stable medial scarring in the right lower lobe and the inferior lingula, image 43. Minor dependent basilar atelectasis. No acute pneumonia, collapse or consolidation. No interstitial process or edema. Negative for pneumothorax. Trachea and central airways remain patent. Upper abdomen: Included upper abdomen demonstrates no acute process. Abdominal atherosclerosis noted. Musculoskeletal: Diffuse thoracic degenerative changes. Upper thoracic vertebral hemangioma present, stable. No acute compression fracture. Sternum intact. Review of the MIP images confirms the above findings. IMPRESSION: Slight measurable increase in fusiform aneurysmal dilatation of the ascending thoracic aorta, maximal diameter 5.2 cm, previously 4.6  cm. Stable 9 mm penetrating atherosclerotic ulcer of the aortic isthmus. Extensive aortic and coronary atherosclerosis Diffuse emphysema pattern and stable 4 mm right upper lobe nodule No superimposed acute chest process. Slightly more prominent precarinal and subcarinal lymph nodes, suspect reactive or inflammatory. Electronically Signed   By: Jerilynn Mages.  Shick M.D.   On: 04/11/2015 15:15    Assessment & Plan:   Brad [name] was seen today for 6 month follow up.  Diagnoses and all orders for this visit:  Flank pain, acute -     DG Abd 2 Views; Future -     DG Lumbar Spine 2-3 Views; Future -     DG HIP UNILAT W OR W/O PELVIS 2-3 VIEWS LEFT; Future -     POCT urinalysis dipstick -     POCT UA - Microscopic Only -     US Renal; Future  Essential hypertension -     amLODipine (NORVASC) 5 MG tablet; Take 1 tablet (5 mg total) by mouth daily. -     furosemide (LASIX) 20 MG tablet; Take 1 tablet (20 mg total) by mouth daily. -     potassium chloride SA (K-DUR,KLOR-CON) 20 MEQ tablet; Take 1 tablet (20 mEq total) by mouth daily. -     US Renal; Future  BPH (benign prostatic hyperplasia) -     US Renal; Future  Hyperlipemia  Atherosclerosis of native coronary artery of native heart with other form of angina pectoris (HCC)  Coronary artery disease due  to calcified coronary lesion -     amLODipine (NORVASC) 5 MG tablet; Take 1 tablet (5 mg total) by mouth daily.  Scalp psoriasis -     clobetasol (TEMOVATE) 0.05 % external solution; Apply 2-3 drops to AA of scalp BID -     ketoconazole (NIZORAL) 2 % shampoo; Use 2-3 times weekly. Leave on for 5 min. Rinse.  Urine troubles -     tamsulosin (FLOMAX) 0.4 MG CAPS capsule; Take 2 capsules (0.8 mg total) by mouth at bedtime. -     US Renal; Future  Other orders -     celecoxib (CELEBREX) 200 MG capsule; TAKE 1 CAPSULE (200 MG TOTAL) BY MOUTH DAILY. WITH FOOD. FOR JOINT PAIN -     clopidogrel (PLAVIX) 75 MG tablet; Take 1 tablet (75 mg total) by mouth  daily. -     isosorbide mononitrate (IMDUR) 30 MG 24 hr tablet; Take 1 tablet (30 mg total) by mouth daily. -     rosuvastatin (CRESTOR) 10 MG tablet; Take 1 tablet (10 mg total) by mouth daily. Patient taking 1/2 pill daily   I have changed Mr. Moltz's clopidogrel, isosorbide mononitrate, and rosuvastatin. I am also having him maintain his fish oil-omega-3 fatty acids, TURMERIC CURCUMIN PO, Multiple Vitamins-Minerals (PRESERVISION AREDS PO), loratadine, triamcinolone cream, fluocinolone, oxyCODONE-acetaminophen, amLODipine, celecoxib, clobetasol, furosemide, ketoconazole, potassium chloride SA, and tamsulosin.  Meds ordered this encounter  Medications  . oxyCODONE-acetaminophen (PERCOCET) 10-325 MG tablet    Sig: Take 1 tablet by mouth every 4 (four) hours as needed for pain (given to pt from hand doctor).  Marland Kitchen amLODipine (NORVASC) 5 MG tablet    Sig: Take 1 tablet (5 mg total) by mouth daily.    Dispense:  90 tablet    Refill:  0  . celecoxib (CELEBREX) 200 MG capsule    Sig: TAKE 1 CAPSULE (200 MG TOTAL) BY MOUTH DAILY. WITH FOOD. FOR JOINT PAIN    Dispense:  30 capsule    Refill:  5  . clobetasol (TEMOVATE) 0.05 % external solution    Sig: Apply 2-3 drops to AA of scalp BID    Dispense:  50 mL    Refill:  4  . clopidogrel (PLAVIX) 75 MG tablet    Sig: Take 1 tablet (75 mg total) by mouth daily.    Dispense:  30 tablet    Refill:  5  . furosemide (LASIX) 20 MG tablet    Sig: Take 1 tablet (20 mg total) by mouth daily.    Dispense:  30 tablet    Refill:  11  . isosorbide mononitrate (IMDUR) 30 MG 24 hr tablet    Sig: Take 1 tablet (30 mg total) by mouth daily.    Dispense:  30 tablet    Refill:  5  . ketoconazole (NIZORAL) 2 % shampoo    Sig: Use 2-3 times weekly. Leave on for 5 min. Rinse.    Dispense:  120 mL    Refill:  0  . potassium chloride SA (K-DUR,KLOR-CON) 20 MEQ tablet    Sig: Take 1 tablet (20 mEq total) by mouth daily.    Dispense:  90 tablet    Refill:  3    . rosuvastatin (CRESTOR) 10 MG tablet    Sig: Take 1 tablet (10 mg total) by mouth daily. Patient taking 1/2 pill daily    Dispense:  30 tablet    Refill:  5  . tamsulosin (FLOMAX) 0.4 MG CAPS capsule    Sig:  Take 2 capsules (0.8 mg total) by mouth at bedtime.    Dispense:  180 capsule    Refill:  3     Follow-up: Return in about 6 months (around 01/04/2016), or if symptoms worsen or fail to improve, for hypertension, cholesterol, Arthritis.  [name], M.D.

## 2015-07-08 ENCOUNTER — Other Ambulatory Visit: Payer: Medicare Other

## 2015-07-08 DIAGNOSIS — Z79899 Other long term (current) drug therapy: Secondary | ICD-10-CM | POA: Diagnosis not present

## 2015-07-09 LAB — SPECIMEN STATUS

## 2015-07-10 LAB — CMP14+EGFR
A/G RATIO: 1.6 (ref 1.1–2.5)
ALT: 12 IU/L (ref 0–44)
AST: 15 IU/L (ref 0–40)
Albumin: 4.1 g/dL (ref 3.5–4.7)
Alkaline Phosphatase: 50 IU/L (ref 39–117)
BUN / CREAT RATIO: 18 (ref 10–22)
BUN: 14 mg/dL (ref 8–27)
Bilirubin Total: 0.7 mg/dL (ref 0.0–1.2)
CALCIUM: 9.1 mg/dL (ref 8.6–10.2)
CHLORIDE: 101 mmol/L (ref 96–106)
CO2: 25 mmol/L (ref 18–29)
CREATININE: 0.8 mg/dL (ref 0.76–1.27)
GFR calc Af Amer: 94 mL/min/{1.73_m2} (ref >59)
GFR, EST NON AFRICAN AMERICAN: 81 mL/min/{1.73_m2} (ref >59)
GLUCOSE: 92 mg/dL (ref 65–99)
Globulin, Total: 2.5 g/dL (ref 1.5–4.5)
POTASSIUM: 5.2 mmol/L (ref 3.5–5.2)
Sodium: 141 mmol/L (ref 134–144)
TOTAL PROTEIN: 6.6 g/dL (ref 6.0–8.5)

## 2015-07-10 LAB — CBC WITH DIFFERENTIAL/PLATELET
BASOS ABS: 0 10*3/uL (ref 0.0–0.2)
Basos: 1 %
EOS (ABSOLUTE): 0.4 10*3/uL (ref 0.0–0.4)
Eos: 7 %
Hematocrit: 42.4 % (ref 37.5–51.0)
Hemoglobin: 14.1 g/dL (ref 12.6–17.7)
Immature Grans (Abs): 0 10*3/uL (ref 0.0–0.1)
Immature Granulocytes: 0 %
LYMPHS ABS: 1.7 10*3/uL (ref 0.7–3.1)
Lymphs: 27 %
MCH: 32 pg (ref 26.6–33.0)
MCHC: 33.3 g/dL (ref 31.5–35.7)
MCV: 96 fL (ref 79–97)
MONOS ABS: 0.7 10*3/uL (ref 0.1–0.9)
Monocytes: 11 %
Neutrophils Absolute: 3.5 10*3/uL (ref 1.4–7.0)
Neutrophils: 54 %
Platelets: 179 10*3/uL (ref 150–379)
RBC: 4.41 x10E6/uL (ref 4.14–5.80)
RDW: 13.6 % (ref 12.3–15.4)
WBC: 6.4 10*3/uL (ref 3.4–10.8)

## 2015-07-10 LAB — LIPID PANEL
CHOLESTEROL TOTAL: 146 mg/dL (ref 100–199)
Chol/HDL Ratio: 2.8 ratio units (ref 0.0–5.0)
HDL: 53 mg/dL (ref >39)
LDL Calculated: 83 mg/dL (ref 0–99)
Triglycerides: 49 mg/dL (ref 0–149)
VLDL CHOLESTEROL CAL: 10 mg/dL (ref 5–40)

## 2015-07-10 LAB — SPECIMEN STATUS REPORT

## 2015-07-13 ENCOUNTER — Other Ambulatory Visit: Payer: Self-pay | Admitting: Nurse Practitioner

## 2015-07-20 NOTE — Progress Notes (Signed)
Cardiology Office Note   Date:  07/21/2015   ID:  Brad [name], DOB 1927-06-02, MRN [id number]  PCP:  [name], MD  Cardiologist:   Dorris Carnes, MD   F/U of CAD and bradycarda      History of Present Illness: Brad [name] is a 82 y.o. male with a history of  CAD, dyslipidemai, PAD, DJD, COPD, HTN. GERD. S/p AAA repari. Last cath in March 2005: LVEF 60%. L RA occluded. R RA ok. 60% prox/mid LD; 75 ot80% D1, 75% small D2, OM2 stent OK 50% prior; PDA 70%. Dobbutamine myoview 5/06: NO ischemia or scar. Echo 1/07: Normal LVEF Mild aortic root dlitation. Pt also with bradycardoaI saw the pt in Aug 2016  Since seen he has done OK  He has not taken meds today   Notes some dizziness about 2 times per week  No sncope No CP  Breathinig is fair      Outpatient Prescriptions Prior to Visit  Medication Sig Dispense Refill  . amLODipine (NORVASC) 5 MG tablet Take 1 tablet (5 mg total) by mouth daily. 90 tablet 0  . celecoxib (CELEBREX) 200 MG capsule TAKE 1 CAPSULE (200 MG TOTAL) BY MOUTH DAILY. WITH FOOD. FOR JOINT PAIN 30 capsule 5  . clobetasol (TEMOVATE) 0.05 % external solution Apply 2-3 drops to AA of scalp BID 50 mL 4  . clopidogrel (PLAVIX) 75 MG tablet Take 1 tablet (75 mg total) by mouth daily. 30 tablet 5  . fish oil-omega-3 fatty acids 1000 MG capsule Take 1 g by mouth daily.      . fluocinolone (VANOS) 0.01 % cream Apply 1 application topically daily.    . furosemide (LASIX) 20 MG tablet Take 1 tablet (20 mg total) by mouth daily. 30 tablet 11  . isosorbide mononitrate (IMDUR) 30 MG 24 hr tablet Take 1 tablet (30 mg total) by mouth daily. 30 tablet 5  . ketoconazole (NIZORAL) 2 % shampoo Use 2-3 times weekly. Leave on for 5 min. Rinse. 120 mL 0  . loratadine (CLARITIN) 10 MG tablet Take 1 tablet (10 mg total) by mouth daily. 30 tablet 11  . Multiple Vitamins-Minerals (PRESERVISION AREDS PO) Take 1 tablet by mouth daily.    . potassium chloride SA (K-DUR,KLOR-CON) 20  MEQ tablet Take 1 tablet (20 mEq total) by mouth daily. 90 tablet 3  . rosuvastatin (CRESTOR) 10 MG tablet TAKE 1 TABLET (10 MG TOTAL) BY MOUTH DAILY. 30 tablet 5  . tamsulosin (FLOMAX) 0.4 MG CAPS capsule Take 2 capsules (0.8 mg total) by mouth at bedtime. 180 capsule 3  . triamcinolone cream (KENALOG) 0.1 % Apply 1 application topically 2 (two) times daily. Apply to AA of trunk, arms, legs. Avoid underarms, genitals, face, groin. 453.6 g 3  . TURMERIC CURCUMIN PO Take 1 tablet by mouth daily.    Marland Kitchen oxyCODONE-acetaminophen (PERCOCET) 10-325 MG tablet Take 1 tablet by mouth every 4 (four) hours as needed for pain (given to pt from hand doctor).     No facility-administered medications prior to visit.     Allergies:   Aspirin; Atorvastatin; and Cephalexin   Past Medical History  Diagnosis Date  . HTN (hypertension)     Last echo 1/07:  Normal LVF, LV thickness upper limits of normal, mild aortic root dilatation, mild to mod MAC, mild LAE, borderline RVH, mild RAE.  . Tobacco abuse   . 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 COPD (chronic obstructive pulmonary disease) ([place])   . Unilateral congenital absence  of kidney   . CAD (coronary artery disease)     s/p stent to OM2 in past;  Last LHC 3/05: EF 60%, left RA occluded, right RA ok, prox to mid LAD 60%, oD1 75-80%, oD2 small 75%, pOM1 50%, OM2 stent ok with 50% before stent, mRCA 30%, dRCA 30%, PDA 70%.  Last dobutamine myoview 5/06: no scar or ischemia, EF 60%.  Marland Kitchen HLD (hyperlipidemia)   . PAD (peripheral artery disease) ([place])   . S/P AAA repair   . Ascending aortic aneurysm (HCC)     Dr. Roxan Hockey  . DJD (degenerative joint disease)   . GERD (gastroesophageal reflux disease)   . Hernia, abdominal   . Macular degeneration     Past Surgical History  Procedure Laterality Date  . Appendectomy    . Hernia repair    . Resection and grafting of infarenal  abdominal  aortic aneurysm  05/03/2002  . Carpal tunnel release Right 12/26/2014    Procedure:  RIGHT ENDOSCOPIC CARPAL TUNNEL SYNDROME RELEASE;  Surgeon: Milly Jakob, MD;  Location: [place] [facility];  Service: Orthopedics;  Laterality: Right;     Social History:  The patient  reports that he quit smoking about 19 years ago. He has never used smokeless tobacco. He reports that he does not drink alcohol or use illicit drugs.   Family History:  The patient's family history includes COPD in his brother and sister; Cancer in his brother, mother, and sister; Early death in his sister; Stroke in his mother.    ROS:  Please see the history of present illness. All other systems are reviewed and  Negative to the above problem except as noted.    PHYSICAL EXAM: VS:  BP 100/70 mmHg  Pulse 60  Ht 5\' 6"  (1.676 m)  Wt 196 lb 12.8 oz (89.268 kg)  BMI 31.78 kg/m2  SpO2 95%  BP on my check 130/ GEN: Well nourished, well developed, in no acute distress HEENT: normal Neck: no JVD, carotid bruits, or masses Cardiac: RRR; no murmurs, rubs, or gallops,Tr edema  Respiratory:  clear to auscultation bilaterally, normal work of breathing GI: soft, nontender, nondistended, + BS  No hepatomegaly  MS: no deformity Moving all extremities   Skin: warm and dry, no rash Neuro:  Strength and sensation are intact Psych: euthymic mood, full affect   EKG:  EKG is ordered today.  SB 56 Occaional PACs     Lipid Panel    Component Value Date/Time   CHOL 146 07/08/2015 0857   CHOL 125 05/03/2013 1438   CHOL 127 09/05/2011 1029   TRIG 49 07/08/2015 0857   TRIG 29 09/05/2011 1029   HDL 53 07/08/2015 0857   HDL 40.10 05/03/2013 1438   HDL 51 09/05/2011 1029   CHOLHDL 2.8 07/08/2015 0857   CHOLHDL 3 05/03/2013 1438   VLDL 19.8 05/03/2013 1438   LDLCALC 83 07/08/2015 0857   LDLCALC 65 05/03/2013 1438   LDLCALC 70 09/05/2011 1029      Wt Readings from Last 3 Encounters:  07/21/15 196 lb 12.8 oz (89.268 kg)  07/07/15 200 lb (90.719 kg)  04/11/15 203 lb (92.08 kg)      ASSESSMENT AND  PLAN:  1Bradycardia  Continue to follow  Pt does not want pacer    2.  CAD  No symptoms to suggest angina  3.  HL  Lipids good    4  HTN  Cut back on amlodipine to 2.5   Follow symptoms dizziness) and  bp   Disposition:   FU with me in 6 months   Signed, Dorris Carnes, MD  07/21/2015 8:26 AM    [address], [place], [place]  [address] Phone: [phone]; Fax: [phone]

## 2015-07-21 ENCOUNTER — Ambulatory Visit (INDEPENDENT_AMBULATORY_CARE_PROVIDER_SITE_OTHER): Payer: Medicare Other | Admitting: Internal Medicine

## 2015-07-21 ENCOUNTER — Encounter: Payer: Self-pay | Admitting: Internal Medicine

## 2015-07-21 VITALS — BP 100/70 | HR 60 | Ht 66.0 in | Wt 196.8 lb

## 2015-07-21 DIAGNOSIS — R001 Bradycardia, unspecified: Secondary | ICD-10-CM | POA: Diagnosis not present

## 2015-07-21 DIAGNOSIS — I2584 Coronary atherosclerosis due to calcified coronary lesion: Secondary | ICD-10-CM

## 2015-07-21 DIAGNOSIS — I2510 Atherosclerotic heart disease of native coronary artery without angina pectoris: Secondary | ICD-10-CM

## 2015-07-21 DIAGNOSIS — I10 Essential (primary) hypertension: Secondary | ICD-10-CM

## 2015-07-21 MED ORDER — OXYCODONE-ACETAMINOPHEN 5-325 MG PO TABS: 1.0000 | ORAL_TABLET | Freq: Four times a day (QID) | ORAL | Status: DC | PRN

## 2015-07-21 MED ORDER — AMLODIPINE BESYLATE 5 MG PO TABS: 2.5000 mg | ORAL_TABLET | Freq: Every day | ORAL | Status: DC

## 2015-07-21 NOTE — Patient Instructions (Signed)
Your physician has recommended you make the following change in your medication:  1.) DECREASE AMLODIPINE TO 2.5 MG ONCE A DAY (BLOOD PRESSURE)  Your physician wants you to follow-up in: [place] DR. Harrington Challenger.  You will receive a reminder letter in the mail two months in advance. If you don't receive a letter, please call our office to schedule the follow-up appointment.

## 2015-07-31 ENCOUNTER — Encounter: Payer: Self-pay | Admitting: Family Medicine

## 2015-07-31 ENCOUNTER — Ambulatory Visit (INDEPENDENT_AMBULATORY_CARE_PROVIDER_SITE_OTHER): Payer: Medicare Other | Admitting: Family Medicine

## 2015-07-31 ENCOUNTER — Telehealth: Payer: Self-pay | Admitting: Family Medicine

## 2015-07-31 VITALS — BP 116/72 | HR 53 | Temp 97.6°F | Ht 66.0 in | Wt 196.8 lb

## 2015-07-31 DIAGNOSIS — J410 Simple chronic bronchitis: Secondary | ICD-10-CM

## 2015-07-31 DIAGNOSIS — K219 Gastro-esophageal reflux disease without esophagitis: Secondary | ICD-10-CM

## 2015-07-31 DIAGNOSIS — I2510 Atherosclerotic heart disease of native coronary artery without angina pectoris: Secondary | ICD-10-CM | POA: Diagnosis not present

## 2015-07-31 DIAGNOSIS — I2584 Coronary atherosclerosis due to calcified coronary lesion: Secondary | ICD-10-CM | POA: Diagnosis not present

## 2015-07-31 MED ORDER — POCKET SPACER DEVI: 1.0000 | Freq: Every day | Status: DC | PRN

## 2015-07-31 MED ORDER — ALBUTEROL SULFATE HFA 108 (90 BASE) MCG/ACT IN AERS: 2.0000 | INHALATION_SPRAY | Freq: Four times a day (QID) | RESPIRATORY_TRACT | Status: DC | PRN

## 2015-07-31 MED ORDER — PANTOPRAZOLE SODIUM 20 MG PO TBEC: 20.0000 mg | DELAYED_RELEASE_TABLET | Freq: Every day | ORAL | Status: DC

## 2015-07-31 MED ORDER — OMEPRAZOLE 20 MG PO CPDR: 20.0000 mg | DELAYED_RELEASE_CAPSULE | Freq: Every day | ORAL | Status: DC

## 2015-07-31 NOTE — Progress Notes (Signed)
BP 116/72 mmHg  Pulse 53  Temp(Src) 97.6 F (36.4 C) (Oral)  Ht 5\' 6"  (1.676 m)  Wt 196 lb 12.8 oz (89.268 kg)  BMI 31.78 kg/m2  SpO2 94%   Subjective:    Patient ID: Brad [name], male    DOB: 1928-01-23, 82 y.o.   MRN: [id number]  HPI: Brad [name] is a 82 y.o. male presenting on 07/31/2015 for Shortness of Breath   HPI Shortness of breath Patient is coming in today because 4 days ago he had an episode where he had shortness of breath and felt tightness throat. This lasted a few hours and then it was gone and has not returned over the past few days. He is brought in today because he does have a significant smoking history and his family is concerned whether or not he could have some underlying COPD. He does not currently smoke and has not been quite a few years. He denies any fevers or chills. He denies any cough or runny nose or nasal congestion or ear pressure. He denies any fevers or chills.  Relevant past medical, surgical, family and social history reviewed and updated as indicated. Interim medical history since our last visit reviewed. Allergies and medications reviewed and updated.  Review of Systems  Constitutional: Negative for fever and chills.  HENT: Negative for congestion, ear discharge, ear pain, rhinorrhea, sinus pressure, sneezing and sore throat.   Eyes: Negative for discharge and visual disturbance.  Respiratory: Positive for shortness of breath. Negative for cough and wheezing.   Cardiovascular: Negative for chest pain and leg swelling.  Gastrointestinal: Negative for abdominal pain, diarrhea and constipation.  Genitourinary: Negative for difficulty urinating.  Musculoskeletal: Negative for back pain and gait problem.  Skin: Negative for rash.  Neurological: Negative for syncope, light-headedness and headaches.  All other systems reviewed and are negative.   Per HPI unless specifically indicated above     Medication List       This list is  accurate as of: 07/31/15  1:02 PM.  Always use your most recent med list.               albuterol 108 (90 Base) MCG/ACT inhaler  Commonly known as:  PROVENTIL HFA;VENTOLIN HFA  Inhale 2 puffs into the lungs every 6 (six) hours as needed for wheezing or shortness of breath.     amLODipine 5 MG tablet  Commonly known as:  NORVASC  Take 0.5 tablets (2.5 mg total) by mouth daily.     celecoxib 200 MG capsule  Commonly known as:  CELEBREX  TAKE 1 CAPSULE (200 MG TOTAL) BY MOUTH DAILY. WITH FOOD. FOR JOINT PAIN     clobetasol 0.05 % external solution  Commonly known as:  TEMOVATE  Apply 2-3 drops to AA of scalp BID     clopidogrel 75 MG tablet  Commonly known as:  PLAVIX  Take 1 tablet (75 mg total) by mouth daily.     fish oil-omega-3 fatty acids 1000 MG capsule  Take 1 g by mouth daily.     fluocinolone 0.01 % cream  Commonly known as:  VANOS  Apply 1 application topically daily.     furosemide 20 MG tablet  Commonly known as:  LASIX  Take 1 tablet (20 mg total) by mouth daily.     isosorbide mononitrate 30 MG 24 hr tablet  Commonly known as:  IMDUR  Take 1 tablet (30 mg total) by mouth daily.     ketoconazole  2 % shampoo  Commonly known as:  NIZORAL  Use 2-3 times weekly. Leave on for 5 min. Rinse.     loratadine 10 MG tablet  Commonly known as:  CLARITIN  Take 1 tablet (10 mg total) by mouth daily.     omeprazole 20 MG capsule  Commonly known as:  PRILOSEC  Take 1 capsule (20 mg total) by mouth daily.     oxyCODONE-acetaminophen 5-325 MG tablet  Commonly known as:  PERCOCET/ROXICET  Take 1 tablet by mouth every 6 (six) hours as needed for severe pain.     POCKET SPACER Devi  1 each by Does not apply route daily as needed.     potassium chloride SA 20 MEQ tablet  Commonly known as:  K-DUR,KLOR-CON  Take 1 tablet (20 mEq total) by mouth daily.     PRESERVISION AREDS PO  Take 1 tablet by mouth daily.     rosuvastatin 5 MG tablet  Commonly known as:   CRESTOR  Take 5 mg by mouth daily.     tamsulosin 0.4 MG Caps capsule  Commonly known as:  FLOMAX  Take 2 capsules (0.8 mg total) by mouth at bedtime.     triamcinolone cream 0.1 %  Commonly known as:  KENALOG  Apply 1 application topically 2 (two) times daily. Apply to AA of trunk, arms, legs. Avoid underarms, genitals, face, groin.     TURMERIC CURCUMIN PO  Take 1 tablet by mouth daily.           Objective:    BP 116/72 mmHg  Pulse 53  Temp(Src) 97.6 F (36.4 C) (Oral)  Ht 5\' 6"  (1.676 m)  Wt 196 lb 12.8 oz (89.268 kg)  BMI 31.78 kg/m2  SpO2 94%  Wt Readings from Last 3 Encounters:  07/31/15 196 lb 12.8 oz (89.268 kg)  07/21/15 196 lb 12.8 oz (89.268 kg)  07/07/15 200 lb (90.719 kg)    Physical Exam  Constitutional: He is oriented to person, place, and time. He appears well-developed and well-nourished. No distress.  HENT:  Right Ear: External ear normal.  Left Ear: External ear normal.  Nose: Nose normal.  Mouth/Throat: Oropharynx is clear and moist. No oropharyngeal exudate.  Eyes: Conjunctivae and EOM are normal. Pupils are equal, round, and reactive to light. Right eye exhibits no discharge. No scleral icterus.  Neck: Neck supple. No thyromegaly present.  Cardiovascular: Normal rate, regular rhythm, normal heart sounds and intact distal pulses.   No murmur heard. Pulmonary/Chest: Effort normal and breath sounds normal. No respiratory distress. He has no wheezes. He has no rales.  Abdominal: He exhibits no distension. There is no tenderness. There is no rebound.  Musculoskeletal: Normal range of motion. He exhibits no edema.  Lymphadenopathy:    He has no cervical adenopathy.  Neurological: He is alert and oriented to person, place, and time. Coordination normal.  Skin: Skin is warm and dry. No rash noted. He is not diaphoretic.  Psychiatric: He has a normal mood and affect. His behavior is normal.  Vitals reviewed.     Assessment & Plan:   Problem List  Items Addressed This Visit      Respiratory   COPD (chronic obstructive pulmonary disease) ([place]) - Primary    Gave albuterol inhaler just in case the symptoms return and could be underlying COPD due to smoking history.      Relevant Medications   albuterol (PROVENTIL HFA;VENTOLIN HFA) 108 (90 Base) MCG/ACT inhaler   Spacer/Aero-Holding Chambers (POCKET SPACER)  DEVI     Digestive   GERD    Patient had symptoms of tightness in his throat and shortness of breath, could be GERD, Artie has Prilosec but is not taking, recommended to start taking it          Follow up plan: Return if symptoms worsen or fail to improve.  Counseling provided for all of the vaccine components No orders of the defined types were placed in this encounter.    Caryl Pina, MD [facility] 07/31/2015, 1:02 PM

## 2015-07-31 NOTE — Telephone Encounter (Signed)
Spoke with pharmacist at [facility] they have filled the Protonix and will discontinue the Prilosec.

## 2015-07-31 NOTE — Telephone Encounter (Signed)
Discussed with our in-house pharmacist and she says that Protonix would be a better option than the Prilosec, send Protonix for him. Caryl Pina, MD [facility] 07/31/2015, 1:52 PM

## 2015-08-02 NOTE — Assessment & Plan Note (Signed)
Gave albuterol inhaler just in case the symptoms return and could be underlying COPD due to smoking history.

## 2015-08-02 NOTE — Assessment & Plan Note (Signed)
Patient had symptoms of tightness in his throat and shortness of breath, could be GERD, Artie has Prilosec but is not taking, recommended to start taking it

## 2015-08-09 DIAGNOSIS — I10 Essential (primary) hypertension: Secondary | ICD-10-CM | POA: Diagnosis not present

## 2015-08-09 DIAGNOSIS — E7800 Pure hypercholesterolemia, unspecified: Secondary | ICD-10-CM | POA: Diagnosis not present

## 2015-08-09 DIAGNOSIS — H353132 Nonexudative age-related macular degeneration, bilateral, intermediate dry stage: Secondary | ICD-10-CM | POA: Diagnosis not present

## 2015-08-09 DIAGNOSIS — H26492 Other secondary cataract, left eye: Secondary | ICD-10-CM | POA: Diagnosis not present

## 2015-08-19 ENCOUNTER — Other Ambulatory Visit: Payer: Self-pay | Admitting: Family Medicine

## 2015-09-07 DIAGNOSIS — H353122 Nonexudative age-related macular degeneration, left eye, intermediate dry stage: Secondary | ICD-10-CM | POA: Diagnosis not present

## 2015-09-07 DIAGNOSIS — H35033 Hypertensive retinopathy, bilateral: Secondary | ICD-10-CM | POA: Diagnosis not present

## 2015-09-26 ENCOUNTER — Telehealth: Payer: Self-pay | Admitting: Internal Medicine

## 2015-09-26 DIAGNOSIS — Z87898 Personal history of other specified conditions: Secondary | ICD-10-CM

## 2015-09-26 DIAGNOSIS — R42 Dizziness and giddiness: Secondary | ICD-10-CM

## 2015-09-26 DIAGNOSIS — Z8679 Personal history of other diseases of the circulatory system: Secondary | ICD-10-CM

## 2015-09-26 NOTE — Telephone Encounter (Signed)
Order placed for event monitor. Will send staff message to [facility]. And will check BP and pulse when he comes for monitor.

## 2015-09-26 NOTE — Telephone Encounter (Signed)
Pt's son called earlier today Says that pt is having dizzy spells  A few per day No syncope He has hsitory of bradycardia Recomm:  Set up for event monitor ON day he comes in for pick up should have BP and P checked Instructed son to call back if passes out

## 2015-09-26 NOTE — Addendum Note (Signed)
Addended by: Rodman Key on: 09/26/2015 04:32 PM   Modules accepted: Orders

## 2015-10-04 ENCOUNTER — Ambulatory Visit (INDEPENDENT_AMBULATORY_CARE_PROVIDER_SITE_OTHER): Payer: Medicare Other

## 2015-10-04 DIAGNOSIS — Z8679 Personal history of other diseases of the circulatory system: Secondary | ICD-10-CM | POA: Diagnosis not present

## 2015-10-04 DIAGNOSIS — R42 Dizziness and giddiness: Secondary | ICD-10-CM | POA: Diagnosis not present

## 2015-10-05 ENCOUNTER — Telehealth: Payer: Self-pay | Admitting: *Deleted

## 2015-10-05 NOTE — Telephone Encounter (Signed)
Late entry for 10/04/15 Patient arrived to have his monitor placed. While he was in the clinic, we checked his BP and HR.  140/78, 80. Will forward to Dr. Harrington Challenger for her information.

## 2015-10-09 ENCOUNTER — Other Ambulatory Visit: Payer: Self-pay | Admitting: Family Medicine

## 2015-10-11 ENCOUNTER — Telehealth: Payer: Self-pay | Admitting: Cardiology

## 2015-10-11 NOTE — Telephone Encounter (Signed)
Paged by [employer]. Brad [name] had several 3 second pauses with an underlying rhythm of SB. He was completely asymptomatic. He has a history of bradycardia. Will continue to monitor.

## 2015-10-14 ENCOUNTER — Telehealth: Payer: Self-pay | Admitting: Cardiology

## 2015-10-14 NOTE — Telephone Encounter (Signed)
Monitoring service called- pt had a 3.1 sec pause, then back to NSR. This was noted by auto trigger, the service was unable to contact the pt. He is on no rate slowing medications.   Kerin Ransom PA-C 10/14/2015 7:35 AM

## 2015-10-17 ENCOUNTER — Telehealth: Payer: Self-pay | Admitting: *Deleted

## 2015-10-17 NOTE — Telephone Encounter (Signed)
Call from Dr. Harrington Challenger. She discussed patient's monitor alerts with Dr. Lovena Le.  (several 3 second pauses and bradycardia) Appointment scheduled with Dr. Lovena Le for 10/20/15 --ok per Claiborne Billings, RN 12:15 pm   Patient's son, [name] is aware of this appointment.  Received [employer] report for 10/14/15 at 6:40 am--3.1 second pause, sinus bradycardia, sinus rhythm, PVCs, PACs. No symptoms were recorded.  Kerin Ransom was notified by [employer].

## 2015-10-20 ENCOUNTER — Encounter: Payer: Self-pay | Admitting: Internal Medicine

## 2015-10-20 ENCOUNTER — Ambulatory Visit (INDEPENDENT_AMBULATORY_CARE_PROVIDER_SITE_OTHER): Payer: Medicare Other | Admitting: Internal Medicine

## 2015-10-20 VITALS — BP 100/52 | HR 42 | Ht 64.5 in | Wt 194.8 lb

## 2015-10-20 DIAGNOSIS — I480 Paroxysmal atrial fibrillation: Secondary | ICD-10-CM | POA: Diagnosis not present

## 2015-10-20 NOTE — Progress Notes (Signed)
HPI Mr. Sporn is referred today by Dr. Harrington Challenger for evaluation of bradycardia and newly diagnosed atrial fib. He is a pleasant elderly man with multiple medical problems who has had worsening dizziness and fatigue for over the past few months. He has not had frank syncope but does feel dizzy and lazy and has become more sedentary. He does not feel that he is out of rhythm but an ECG from 3 months ago demonstrates sinus bradycardia. In the past he has not been interested in considering a PPM. He denies chest pain. He does have some dyspnea with exertion. Allergies  Allergen Reactions  . Aspirin Shortness Of Breath and Swelling  . Atorvastatin Other (See Comments)    Leg pain, feet pain  . Cephalexin Other (See Comments)    unknown     Current Outpatient Prescriptions  Medication Sig Dispense Refill  . albuterol (PROVENTIL HFA;VENTOLIN HFA) 108 (90 Base) MCG/ACT inhaler Inhale 2 puffs into the lungs every 6 (six) hours as needed for wheezing or shortness of breath. 1 Inhaler 2  . amLODipine (NORVASC) 5 MG tablet Take 2.5 mg by mouth daily.    . celecoxib (CELEBREX) 200 MG capsule TAKE 1 CAPSULE (200 MG TOTAL) BY MOUTH DAILY. WITH FOOD. FOR JOINT PAIN 30 capsule 5  . clobetasol (TEMOVATE) 0.05 % external solution Apply 2-3 drops to AA of scalp twice a day    . clopidogrel (PLAVIX) 75 MG tablet Take 1 tablet (75 mg total) by mouth daily. 30 tablet 5  . fish oil-omega-3 fatty acids 1000 MG capsule Take 1 g by mouth daily.      . fluocinolone (VANOS) 0.01 % cream Apply 1 application topically daily.    . furosemide (LASIX) 20 MG tablet Take 1 tablet (20 mg total) by mouth daily. 30 tablet 11  . isosorbide mononitrate (IMDUR) 30 MG 24 hr tablet TAKE 1 TABLET BY MOUTH ONCE A DAY 30 tablet 4  . ketoconazole (NIZORAL) 2 % shampoo Use 2-3 times weekly. Leave on for 5 min. Rinse. 120 mL 0  . loratadine (CLARITIN) 10 MG tablet Take 1 tablet (10 mg total) by mouth daily. 30 tablet 11  . Multiple  Vitamins-Minerals (PRESERVISION AREDS PO) Take 1 tablet by mouth daily.    Marland Kitchen oxyCODONE-acetaminophen (PERCOCET/ROXICET) 5-325 MG tablet Take 1 tablet by mouth every 6 (six) hours as needed for severe pain. 30 tablet 0  . pantoprazole (PROTONIX) 20 MG tablet Take 20 mg by mouth daily.    . potassium chloride SA (K-DUR,KLOR-CON) 20 MEQ tablet Take 1 tablet (20 mEq total) by mouth daily. 90 tablet 3  . rosuvastatin (CRESTOR) 10 MG tablet Take 5 mg by mouth daily.    Marland Kitchen Spacer/Aero-Holding Chambers (POCKET SPACER) DEVI 1 each by Does not apply route daily as needed. 1 each 0  . tamsulosin (FLOMAX) 0.4 MG CAPS capsule Take 2 capsules (0.8 mg total) by mouth at bedtime. 180 capsule 3  . triamcinolone cream (KENALOG) 0.1 % Apply 1 application topically 2 (two) times daily. Apply to AA of trunk, arms, legs. Avoid underarms, genitals, face, groin. 453.6 g 3  . TURMERIC CURCUMIN PO Take 1 tablet by mouth daily.     No current facility-administered medications for this visit.     Past Medical History  Diagnosis Date  . HTN (hypertension)     Last echo 1/07:  Normal LVF, LV thickness upper limits of normal, mild aortic root dilatation, mild to mod MAC, mild LAE, borderline RVH,  mild RAE.  . Tobacco abuse   . 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 COPD (chronic obstructive pulmonary disease) ([place])   . Unilateral congenital absence of kidney   . CAD (coronary artery disease)     s/p stent to OM2 in past;  Last LHC 3/05: EF 60%, left RA occluded, right RA ok, prox to mid LAD 60%, oD1 75-80%, oD2 small 75%, pOM1 50%, OM2 stent ok with 50% before stent, mRCA 30%, dRCA 30%, PDA 70%.  Last dobutamine myoview 5/06: no scar or ischemia, EF 60%.  Marland Kitchen HLD (hyperlipidemia)   . PAD (peripheral artery disease) ([place])   . S/P AAA repair   . Ascending aortic aneurysm (HCC)     Dr. Roxan Hockey  . DJD (degenerative joint disease)   . GERD (gastroesophageal reflux disease)   . Hernia, abdominal   . Macular degeneration     ROS:    All systems reviewed and negative except as noted in the HPI.   Past Surgical History  Procedure Laterality Date  . Appendectomy    . Hernia repair    . Resection and grafting of infarenal  abdominal  aortic aneurysm  05/03/2002  . Carpal tunnel release Right 12/26/2014    Procedure: RIGHT ENDOSCOPIC CARPAL TUNNEL SYNDROME RELEASE;  Surgeon: Milly Jakob, MD;  Location: [facility];  Service: Orthopedics;  Laterality: Right;     Family History  Problem Relation Age of Onset  . Cancer Mother   . Stroke Mother     with hemi plegia  . Cancer Sister   . COPD Sister   . COPD Brother   . Cancer Brother     Lip  . Early death Sister      Social History   Social History  . Marital Status: Widowed    Spouse Name: N/A  . Number of Children: N/A  . Years of Education: N/A   Occupational History  . Not on file.   Social History Main Topics  . Smoking status: Former Smoker    Quit date: 05/06/1996  . Smokeless tobacco: Never Used  . Alcohol Use: No  . Drug Use: No  . Sexual Activity: No   Other Topics Concern  . Not on file   Social History Narrative     BP 100/52 mmHg  Pulse 42  Ht 5' 4.5" (1.638 m)  Wt 194 lb 12.8 oz (88.361 kg)  BMI 32.93 kg/m2  Physical Exam:  Well appearing NAD HEENT: Unremarkable Neck:  No JVD, no thyromegally Lymphatics:  No adenopathy Back:  No CVA tenderness Lungs:  Clear with no wheezes HEART:  IRegular rate rhythm, no murmurs, no rubs, no clicks Abd:  soft, positive bowel sounds, no organomegally, no rebound, no guarding Ext:  2 plus pulses, no edema, no cyanosis, no clubbing Skin:  No rashes no nodules Neuro:  CN II through XII intact, motor grossly intact  Cardiac monitor - atrial fib with a CVR/SVR   Assess/Plan: 1. Atrial fib - this appears to be new. I will discuss with Dr. Harrington Challenger. He is on plavix. Will discuss whether he should be transitioned to an [facility]. 2. Transient AV block - he appears to have daytime  pauses due to intermittent heart block. I have discussed the treatment options. Because I am not convinced of how symptomatic he is from this, I have recommended a period of watchful waiting. He will continue to wear his heart monitor.  3. AAA - he has been followed by vascular surgery. I  suspect he would not be a surgical candidate for an open approach. 4 CAD - he denies anginal symptoms. Will follow.

## 2015-10-20 NOTE — Patient Instructions (Signed)
Medication Instructions:  Your physician recommends that you continue on your current medications as directed. Please refer to the Current Medication list given to you today.   Labwork: None ordered   Testing/Procedures: None ordered   Follow-Up: Your physician recommends that you schedule a follow-up appointment --will not make one just yet. Dr Lovena Le will discuss with Dr Harrington Challenger   Any Other Special Instructions Will Be Listed Below (If Applicable).     If you need a refill on your cardiac medications before your next appointment, please call your pharmacy.

## 2015-11-13 ENCOUNTER — Telehealth: Payer: Self-pay | Admitting: Internal Medicine

## 2015-11-13 NOTE — Telephone Encounter (Signed)
Dr Harrington Challenger called on Fri and said she would discuss with Dr Lovena Le and get back with them but it looked like he needed a PPM.  His son was going to his house several times during the day and checking his BP and HR and says he brought in a log of recordings.  This is what Dr Harrington Challenger looked at on Fri and called him about.  I let him know I would discuss with Dr Lovena Le and get back with him regarding his recommendation.  He was appreciative of my call

## 2015-11-13 NOTE — Telephone Encounter (Signed)
Will forward to [facility], Dr. Tanna Furry nurse.

## 2015-11-13 NOTE — Telephone Encounter (Signed)
Brad [name] is calling to find out if his father has been schedule for the pacemaker procedure or when will it be schedule . Please call   Thanks

## 2015-11-13 NOTE — Telephone Encounter (Signed)
Assess/Plan: 1. Atrial fib - this appears to be new. I will discuss with Dr. Harrington Challenger. He is on plavix. Will discuss whether he should be transitioned to an [facility]. 2. Transient AV block - he appears to have daytime pauses due to intermittent heart block. I have discussed the treatment options. Because I am not convinced of how symptomatic he is from this, I have recommended a period of watchful waiting. He will continue to wear his heart monitor

## 2015-11-16 NOTE — Telephone Encounter (Signed)
Discussed with Dr Lovena Le and he does need a PPM for SSS.  He monitor has shown day time pauses.  I spoke with the son and he is scheduled for 11/20/15 at 12:00noon.  He is to be at the hospital [place] at 10:00am. NPO after midnight and will hold his Plavix for 2 days prior to the procedure.

## 2015-11-20 ENCOUNTER — Encounter (HOSPITAL_COMMUNITY): Payer: Self-pay | Admitting: General Practice

## 2015-11-20 ENCOUNTER — Ambulatory Visit (HOSPITAL_COMMUNITY)
Admission: RE | Admit: 2015-11-20 | Discharge: 2015-11-22 | Disposition: A | Discharge status: Home / Self Care | Payer: Medicare Other | Source: Ambulatory Visit | Attending: Internal Medicine | Admitting: Internal Medicine

## 2015-11-20 ENCOUNTER — Encounter (HOSPITAL_COMMUNITY)
Admission: RE | Disposition: A | Discharge status: Home / Self Care | Payer: Self-pay | Source: Ambulatory Visit | Attending: Internal Medicine

## 2015-11-20 DIAGNOSIS — E785 Hyperlipidemia, unspecified: Secondary | ICD-10-CM | POA: Diagnosis not present

## 2015-11-20 DIAGNOSIS — I5031 Acute diastolic (congestive) heart failure: Secondary | ICD-10-CM | POA: Diagnosis not present

## 2015-11-20 DIAGNOSIS — Z87891 Personal history of nicotine dependence: Secondary | ICD-10-CM | POA: Diagnosis not present

## 2015-11-20 DIAGNOSIS — H3530 Unspecified macular degeneration: Secondary | ICD-10-CM | POA: Diagnosis not present

## 2015-11-20 DIAGNOSIS — Z79899 Other long term (current) drug therapy: Secondary | ICD-10-CM | POA: Insufficient documentation

## 2015-11-20 DIAGNOSIS — Z791 Long term (current) use of non-steroidal anti-inflammatories (NSAID): Secondary | ICD-10-CM | POA: Diagnosis not present

## 2015-11-20 DIAGNOSIS — R55 Syncope and collapse: Secondary | ICD-10-CM

## 2015-11-20 DIAGNOSIS — I495 Sick sinus syndrome: Secondary | ICD-10-CM | POA: Diagnosis not present

## 2015-11-20 DIAGNOSIS — I480 Paroxysmal atrial fibrillation: Secondary | ICD-10-CM | POA: Insufficient documentation

## 2015-11-20 DIAGNOSIS — K219 Gastro-esophageal reflux disease without esophagitis: Secondary | ICD-10-CM | POA: Insufficient documentation

## 2015-11-20 DIAGNOSIS — I2510 Atherosclerotic heart disease of native coronary artery without angina pectoris: Secondary | ICD-10-CM | POA: Insufficient documentation

## 2015-11-20 DIAGNOSIS — I739 Peripheral vascular disease, unspecified: Secondary | ICD-10-CM | POA: Diagnosis not present

## 2015-11-20 DIAGNOSIS — I4891 Unspecified atrial fibrillation: Secondary | ICD-10-CM | POA: Diagnosis present

## 2015-11-20 DIAGNOSIS — Z950 Presence of cardiac pacemaker: Secondary | ICD-10-CM

## 2015-11-20 DIAGNOSIS — I110 Hypertensive heart disease with heart failure: Secondary | ICD-10-CM | POA: Insufficient documentation

## 2015-11-20 DIAGNOSIS — J449 Chronic obstructive pulmonary disease, unspecified: Secondary | ICD-10-CM | POA: Insufficient documentation

## 2015-11-20 DIAGNOSIS — I4439 Other atrioventricular block: Secondary | ICD-10-CM | POA: Diagnosis not present

## 2015-11-20 HISTORY — DX: Benign prostatic hyperplasia without lower urinary tract symptoms: N40.0

## 2015-11-20 HISTORY — DX: Low back pain: M54.5

## 2015-11-20 HISTORY — DX: Sick sinus syndrome: I49.5

## 2015-11-20 HISTORY — PX: INSERT / REPLACE / REMOVE PACEMAKER: SUR710

## 2015-11-20 HISTORY — DX: Other chronic pain: G89.29

## 2015-11-20 HISTORY — DX: Low back pain, unspecified: M54.50

## 2015-11-20 HISTORY — DX: Paroxysmal atrial fibrillation: I48.0

## 2015-11-20 HISTORY — PX: EP IMPLANTABLE DEVICE: SHX172B

## 2015-11-20 HISTORY — DX: Psoriasis, unspecified: L40.9

## 2015-11-20 LAB — BASIC METABOLIC PANEL
Anion gap: 7 (ref 5–15)
BUN: 14 mg/dL (ref 6–20)
CHLORIDE: 108 mmol/L (ref 101–111)
CO2: 26 mmol/L (ref 22–32)
CREATININE: 0.71 mg/dL (ref 0.61–1.24)
Calcium: 9.4 mg/dL (ref 8.9–10.3)
GFR calc Af Amer: >60 mL/min (ref >60)
GFR calc non Af Amer: >60 mL/min (ref >60)
GLUCOSE: 103 mg/dL — AB (ref 65–99)
Potassium: 4.1 mmol/L (ref 3.5–5.1)
Sodium: 141 mmol/L (ref 135–145)

## 2015-11-20 LAB — CBC
HEMATOCRIT: 44.9 % (ref 39.0–52.0)
HEMOGLOBIN: 14.6 g/dL (ref 13.0–17.0)
MCH: 31.5 pg (ref 26.0–34.0)
MCHC: 32.5 g/dL (ref 30.0–36.0)
MCV: 96.8 fL (ref 78.0–100.0)
Platelets: 178 10*3/uL (ref 150–400)
RBC: 4.64 MIL/uL (ref 4.22–5.81)
RDW: 13.4 % (ref 11.5–15.5)
WBC: 6.5 10*3/uL (ref 4.0–10.5)

## 2015-11-20 LAB — SURGICAL PCR SCREEN
MRSA, PCR: NEGATIVE
Staphylococcus aureus: NEGATIVE

## 2015-11-20 SURGERY — PACEMAKER IMPLANT

## 2015-11-20 MED ORDER — SODIUM CHLORIDE 0.9 % IV SOLN
INTRAVENOUS | Status: DC
  Administered 2015-11-20: 11:00:00 via INTRAVENOUS

## 2015-11-20 MED ORDER — SODIUM CHLORIDE 0.9 % IR SOLN
Status: AC
  Filled 2015-11-20: qty 2

## 2015-11-20 MED ORDER — ALBUTEROL SULFATE (2.5 MG/3ML) 0.083% IN NEBU: 2.5000 mg | INHALATION_SOLUTION | Freq: Four times a day (QID) | RESPIRATORY_TRACT | Status: DC | PRN

## 2015-11-20 MED ORDER — VANCOMYCIN HCL IN DEXTROSE 1-5 GM/200ML-% IV SOLN
INTRAVENOUS | Status: AC
  Filled 2015-11-20: qty 200

## 2015-11-20 MED ORDER — ACETAMINOPHEN 325 MG PO TABS
325.0000 mg | ORAL_TABLET | ORAL | Status: DC | PRN
  Administered 2015-11-20 – 2015-11-21 (×3): 650 mg via ORAL
  Filled 2015-11-20 (×3): qty 2

## 2015-11-20 MED ORDER — PANTOPRAZOLE SODIUM 20 MG PO TBEC
20.0000 mg | DELAYED_RELEASE_TABLET | Freq: Every day | ORAL | Status: DC | PRN
  Administered 2015-11-20: 20 mg via ORAL
  Filled 2015-11-20 (×2): qty 1

## 2015-11-20 MED ORDER — VANCOMYCIN HCL IN DEXTROSE 1-5 GM/200ML-% IV SOLN
1000.0000 mg | INTRAVENOUS | Status: AC
  Administered 2015-11-20: 1000 mg via INTRAVENOUS

## 2015-11-20 MED ORDER — FENTANYL CITRATE (PF) 100 MCG/2ML IJ SOLN
INTRAMUSCULAR | Status: DC | PRN
  Administered 2015-11-20 (×3): 12.5 ug via INTRAVENOUS
  Administered 2015-11-20: 25 ug via INTRAVENOUS

## 2015-11-20 MED ORDER — METOPROLOL TARTRATE 5 MG/5ML IV SOLN
INTRAVENOUS | Status: AC
  Filled 2015-11-20: qty 5

## 2015-11-20 MED ORDER — MIDAZOLAM HCL 5 MG/5ML IJ SOLN
INTRAMUSCULAR | Status: DC | PRN
  Administered 2015-11-20: 2 mg via INTRAVENOUS
  Administered 2015-11-20 (×3): 1 mg via INTRAVENOUS

## 2015-11-20 MED ORDER — MIDAZOLAM HCL 5 MG/5ML IJ SOLN
INTRAMUSCULAR | Status: AC
  Filled 2015-11-20: qty 5

## 2015-11-20 MED ORDER — LORATADINE 10 MG PO TABS
10.0000 mg | ORAL_TABLET | Freq: Every day | ORAL | Status: DC
  Administered 2015-11-20 – 2015-11-22 (×3): 10 mg via ORAL
  Filled 2015-11-20 (×3): qty 1

## 2015-11-20 MED ORDER — ISOSORBIDE MONONITRATE ER 30 MG PO TB24
30.0000 mg | ORAL_TABLET | Freq: Every day | ORAL | Status: DC
  Administered 2015-11-20 – 2015-11-22 (×3): 30 mg via ORAL
  Filled 2015-11-20 (×3): qty 1

## 2015-11-20 MED ORDER — FUROSEMIDE 20 MG PO TABS
20.0000 mg | ORAL_TABLET | Freq: Every day | ORAL | Status: DC
  Administered 2015-11-20: 18:00:00 20 mg via ORAL
  Filled 2015-11-20: qty 1

## 2015-11-20 MED ORDER — METOPROLOL TARTRATE 5 MG/5ML IV SOLN
INTRAVENOUS | Status: DC | PRN
  Administered 2015-11-20: 5 mg via INTRAVENOUS

## 2015-11-20 MED ORDER — YOU HAVE A PACEMAKER BOOK
Freq: Once | Status: AC
  Administered 2015-11-20: 19:00:00 1
  Filled 2015-11-20: qty 1

## 2015-11-20 MED ORDER — AMLODIPINE BESYLATE 5 MG PO TABS
2.5000 mg | ORAL_TABLET | Freq: Every day | ORAL | Status: DC
  Administered 2015-11-20: 18:00:00 2.5 mg via ORAL
  Filled 2015-11-20: qty 1

## 2015-11-20 MED ORDER — OMEGA-3-ACID ETHYL ESTERS 1 G PO CAPS
1.0000 g | ORAL_CAPSULE | Freq: Every day | ORAL | Status: DC
Start: 2015-11-20 — End: 2015-11-22
  Administered 2015-11-20 – 2015-11-22 (×3): 1 g via ORAL
  Filled 2015-11-20 (×3): qty 1

## 2015-11-20 MED ORDER — POTASSIUM CHLORIDE CRYS ER 20 MEQ PO TBCR
20.0000 meq | EXTENDED_RELEASE_TABLET | Freq: Every day | ORAL | Status: DC
  Administered 2015-11-20 – 2015-11-22 (×3): 20 meq via ORAL
  Filled 2015-11-20 (×3): qty 1

## 2015-11-20 MED ORDER — TAMSULOSIN HCL 0.4 MG PO CAPS
0.8000 mg | ORAL_CAPSULE | Freq: Every day | ORAL | Status: DC
  Administered 2015-11-20 – 2015-11-21 (×2): 0.8 mg via ORAL
  Filled 2015-11-20 (×3): qty 2

## 2015-11-20 MED ORDER — OXYCODONE-ACETAMINOPHEN 5-325 MG PO TABS
1.0000 | ORAL_TABLET | Freq: Four times a day (QID) | ORAL | Status: DC | PRN
  Administered 2015-11-20 – 2015-11-21 (×2): 1 via ORAL
  Filled 2015-11-20 (×2): qty 1

## 2015-11-20 MED ORDER — SODIUM CHLORIDE 0.9 % IV SOLN
INTRAVENOUS | Status: DC | PRN
  Administered 2015-11-20: 50 mL/h via INTRAVENOUS

## 2015-11-20 MED ORDER — VANCOMYCIN HCL IN DEXTROSE 1-5 GM/200ML-% IV SOLN
1000.0000 mg | Freq: Two times a day (BID) | INTRAVENOUS | Status: AC
  Administered 2015-11-21: 1000 mg via INTRAVENOUS
  Filled 2015-11-20: qty 200

## 2015-11-20 MED ORDER — MUPIROCIN 2 % EX OINT
1.0000 "application " | TOPICAL_OINTMENT | Freq: Once | CUTANEOUS | Status: AC
  Administered 2015-11-20: 1 via TOPICAL

## 2015-11-20 MED ORDER — HEPARIN (PORCINE) IN NACL 2-0.9 UNIT/ML-% IJ SOLN
INTRAMUSCULAR | Status: AC
  Filled 2015-11-20: qty 500

## 2015-11-20 MED ORDER — SODIUM CHLORIDE 0.9 % IR SOLN: 80.0000 mg | Status: DC

## 2015-11-20 MED ORDER — CHLORHEXIDINE GLUCONATE 4 % EX LIQD: 60.0000 mL | Freq: Once | CUTANEOUS | Status: DC

## 2015-11-20 MED ORDER — FENTANYL CITRATE (PF) 100 MCG/2ML IJ SOLN
INTRAMUSCULAR | Status: AC
Start: 2015-11-20 — End: 2015-11-20
  Filled 2015-11-20: qty 2

## 2015-11-20 MED ORDER — ROSUVASTATIN CALCIUM 10 MG PO TABS
5.0000 mg | ORAL_TABLET | Freq: Every day | ORAL | Status: DC
  Administered 2015-11-20 – 2015-11-21 (×2): 5 mg via ORAL
  Filled 2015-11-20 (×2): qty 1
  Filled 2015-11-20: qty 0.5

## 2015-11-20 MED ORDER — ONDANSETRON HCL 4 MG/2ML IJ SOLN: 4.0000 mg | Freq: Four times a day (QID) | INTRAMUSCULAR | Status: DC | PRN

## 2015-11-20 MED ORDER — MUPIROCIN 2 % EX OINT
TOPICAL_OINTMENT | CUTANEOUS | Status: AC
  Filled 2015-11-20: qty 22

## 2015-11-20 MED ORDER — CLOBETASOL PROPIONATE 0.05 % EX SOLN: 1.0000 "application " | Freq: Two times a day (BID) | CUTANEOUS | Status: DC

## 2015-11-20 MED ORDER — LIDOCAINE HCL (PF) 1 % IJ SOLN
INTRAMUSCULAR | Status: AC
  Filled 2015-11-20: qty 30

## 2015-11-20 MED ORDER — HEPARIN (PORCINE) IN NACL 2-0.9 UNIT/ML-% IJ SOLN
INTRAMUSCULAR | Status: DC | PRN
  Administered 2015-11-20: 16:00:00

## 2015-11-20 MED ORDER — LIDOCAINE HCL (PF) 1 % IJ SOLN
INTRAMUSCULAR | Status: DC | PRN
  Administered 2015-11-20: 54 mL via INTRADERMAL

## 2015-11-20 MED ORDER — OFF THE BEAT BOOK
Freq: Once | Status: AC
  Administered 2015-11-20: 1
  Filled 2015-11-20: qty 1

## 2015-11-20 SURGICAL SUPPLY — 10 items
CABLE SURGICAL S-101-97-12 (CABLE) ×2 IMPLANT
CATH RIGHTSITE C315HIS02 (CATHETERS) ×2 IMPLANT
LEAD CAPSURE NOVUS 5076-52CM (Lead) ×2 IMPLANT
LEAD SELECT SECURE 3830 383069 (Lead) IMPLANT
PAD DEFIB LIFELINK (PAD) ×2 IMPLANT
PPM ADVISA MRI DR A2DR01 (Pacemaker) ×2 IMPLANT
SELECT SECURE 3830 383069 (Lead) ×3 IMPLANT
SHEATH CLASSIC 7F (SHEATH) ×4 IMPLANT
TRAY PACEMAKER INSERTION (PACKS) ×2 IMPLANT
WIRE HI TORQ VERSACORE-J 145CM (WIRE) ×2 IMPLANT

## 2015-11-20 NOTE — H&P (Signed)
Expand All Collapse All        HPI Brad [name] is referred today by Dr. Harrington Challenger for evaluation of bradycardia and newly diagnosed atrial fib. He is a pleasant elderly man with multiple medical problems who has had worsening dizziness and fatigue for over the past few months. He has not had frank syncope but does feel dizzy and lazy and has become more sedentary. He does not feel that he is out of rhythm but an ECG from 3 months ago demonstrates sinus bradycardia. In the past he has not been interested in considering a PPM. He denies chest pain. He does have some dyspnea with exertion. Allergies  Allergen Reactions  . Aspirin Shortness Of Breath and Swelling  . Atorvastatin Other (See Comments)    Leg pain, feet pain  . Cephalexin Other (See Comments)    unknown     Current Outpatient Prescriptions  Medication Sig Dispense Refill  . albuterol (PROVENTIL HFA;VENTOLIN HFA) 108 (90 Base) MCG/ACT inhaler Inhale 2 puffs into the lungs every 6 (six) hours as needed for wheezing or shortness of breath. 1 Inhaler 2  . amLODipine (NORVASC) 5 MG tablet Take 2.5 mg by mouth daily.    . celecoxib (CELEBREX) 200 MG capsule TAKE 1 CAPSULE (200 MG TOTAL) BY MOUTH DAILY. WITH FOOD. FOR JOINT PAIN 30 capsule 5  . clobetasol (TEMOVATE) 0.05 % external solution Apply 2-3 drops to AA of scalp twice a day    . clopidogrel (PLAVIX) 75 MG tablet Take 1 tablet (75 mg total) by mouth daily. 30 tablet 5  . fish oil-omega-3 fatty acids 1000 MG capsule Take 1 g by mouth daily.     . fluocinolone (VANOS) 0.01 % cream Apply 1 application topically daily.    . furosemide (LASIX) 20 MG tablet Take 1 tablet (20 mg total) by mouth daily. 30 tablet 11  . isosorbide mononitrate (IMDUR) 30 MG 24 hr tablet TAKE 1 TABLET BY MOUTH ONCE A DAY 30 tablet 4  . ketoconazole (NIZORAL) 2 % shampoo Use 2-3 times weekly. Leave on for 5 min. Rinse. 120 mL 0  .  loratadine (CLARITIN) 10 MG tablet Take 1 tablet (10 mg total) by mouth daily. 30 tablet 11  . Multiple Vitamins-Minerals (PRESERVISION AREDS PO) Take 1 tablet by mouth daily.    Marland Kitchen oxyCODONE-acetaminophen (PERCOCET/ROXICET) 5-325 MG tablet Take 1 tablet by mouth every 6 (six) hours as needed for severe pain. 30 tablet 0  . pantoprazole (PROTONIX) 20 MG tablet Take 20 mg by mouth daily.    . potassium chloride SA (K-DUR,KLOR-CON) 20 MEQ tablet Take 1 tablet (20 mEq total) by mouth daily. 90 tablet 3  . rosuvastatin (CRESTOR) 10 MG tablet Take 5 mg by mouth daily.    Marland Kitchen Spacer/Aero-Holding Chambers (POCKET SPACER) DEVI 1 each by Does not apply route daily as needed. 1 each 0  . tamsulosin (FLOMAX) 0.4 MG CAPS capsule Take 2 capsules (0.8 mg total) by mouth at bedtime. 180 capsule 3  . triamcinolone cream (KENALOG) 0.1 % Apply 1 application topically 2 (two) times daily. Apply to AA of trunk, arms, legs. Avoid underarms, genitals, face, groin. 453.6 g 3  . TURMERIC CURCUMIN PO Take 1 tablet by mouth daily.     No current facility-administered medications for this visit.     Past Medical History  Diagnosis Date  . HTN (hypertension)     Last echo 1/07: Normal LVF, LV thickness upper limits of normal, mild aortic root dilatation, mild to  mod MAC, mild LAE, borderline RVH, mild RAE.  . Tobacco abuse   . 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 COPD (chronic obstructive pulmonary disease) ([place])   . Unilateral congenital absence of kidney   . CAD (coronary artery disease)     s/p stent to OM2 in past; Last LHC 3/05: EF 60%, left RA occluded, right RA ok, prox to mid LAD 60%, oD1 75-80%, oD2 small 75%, pOM1 50%, OM2 stent ok with 50% before stent, mRCA 30%, dRCA 30%, PDA 70%. Last dobutamine myoview 5/06: no scar or ischemia, EF 60%.  Marland Kitchen HLD (hyperlipidemia)   . PAD (peripheral artery disease) ([place])   . S/P AAA repair   .  Ascending aortic aneurysm (HCC)     Dr. Roxan Hockey  . DJD (degenerative joint disease)   . GERD (gastroesophageal reflux disease)   . Hernia, abdominal   . Macular degeneration     ROS:  All systems reviewed and negative except as noted in the HPI.   Past Surgical History  Procedure Laterality Date  . Appendectomy    . Hernia repair    . Resection and grafting of infarenal abdominal aortic aneurysm  05/03/2002  . Carpal tunnel release Right 12/26/2014    Procedure: RIGHT ENDOSCOPIC CARPAL TUNNEL SYNDROME RELEASE; Surgeon: Milly Jakob, MD; Location: [place]; Service: Orthopedics; Laterality: Right;     Family History  Problem Relation Age of Onset  . Cancer Mother   . Stroke Mother     with hemi plegia  . Cancer Sister   . COPD Sister   . COPD Brother   . Cancer Brother     Lip  . Early death Sister      Social History   Social History  . Marital Status: Widowed    Spouse Name: N/A  . Number of Children: N/A  . Years of Education: N/A   Occupational History  . Not on file.   Social History Main Topics  . Smoking status: Former Smoker    Quit date: 05/06/1996  . Smokeless tobacco: Never Used  . Alcohol Use: No  . Drug Use: No  . Sexual Activity: No   Other Topics Concern  . Not on file   Social History Narrative     BP 100/52 mmHg  Pulse 42  Ht 5' 4.5" (1.638 m)  Wt 194 lb 12.8 oz (88.361 kg)  BMI 32.93 kg/m2  Physical Exam:  Well appearing NAD HEENT: Unremarkable Neck: No JVD, no thyromegally Lymphatics: No adenopathy Back: No CVA tenderness Lungs: Clear with no wheezes HEART: IRegular rate rhythm, no murmurs, no rubs, no clicks Abd: soft, positive bowel sounds, no organomegally, no rebound, no guarding Ext: 2 plus pulses, no edema, no cyanosis, no clubbing Skin: No  rashes no nodules Neuro: CN II through XII intact, motor grossly intact  Cardiac monitor - atrial fib with a CVR/SVR   Assess/Plan: 1. Atrial fib - this appears to be new. I will discuss with Dr. Harrington Challenger. He is on plavix. Will discuss whether he should be transitioned to an [facility]. 2. Transient AV block - he appears to have daytime pauses due to intermittent heart block. I have discussed the treatment options. Because I am not convinced of how symptomatic he is from this, I have recommended a period of watchful waiting. He will continue to wear his heart monitor.  3. AAA - he has been followed by vascular surgery. I suspect he would not be a surgical candidate for an open approach.  4 CAD - he denies anginal symptoms. Will follow       EP Attending  Patient seen and examined. Agree with above. Since his last visit, we have been able to correlate his symptoms with daytime dizziness. He has pauses of up to 4 minutes during the daytime and is on no AV nodal blocking drugs. Will plan to proceed with PPM insertion. I have discussed the risks/benefits/goals/expectations of the procedure and he wishes to prceed.  Brad [name],M.D.  Brad [name].D.

## 2015-11-20 NOTE — Progress Notes (Signed)
Notified by nurse that patient is back in atrial fib, rate controlled. Per d/w Dr. Lovena Le, no change in plan for now (cannot do anticoag yet due to fresh pocket).  Jadene Stemmer PA-C

## 2015-11-20 NOTE — Discharge Instructions (Signed)
° ° °  Supplemental Discharge Instructions for  Pacemaker/Defibrillator Patients  Activity No heavy lifting or vigorous activity with your left/right arm for 6 to 8 weeks.  Do not raise your left/right arm above your head for one week.  Gradually raise your affected arm as drawn below.           __          11/24/15                         11/25/15                     11/26/15                      11/27/15  NO DRIVING for  1 week   ; you may begin driving on   [id number]  .  WOUND CARE - Keep the wound area clean and dry.  Do not get this area wet for one week. No showers for one week; you may shower on   11/27/15  . - The tape/steri-strips on your wound will fall off; do not pull them off.  No bandage is needed on the site.  DO  NOT apply any creams, oils, or ointments to the wound area. - If you notice any drainage or discharge from the wound, any swelling or bruising at the site, or you develop a fever > 101? F after you are discharged home, call the office at once.  Special Instructions - You are still able to use cellular telephones; use the ear opposite the side where you have your pacemaker/defibrillator.  Avoid carrying your cellular phone near your device. - When traveling through airports, show security personnel your identification card to avoid being screened in the metal detectors.  Ask the security personnel to use the hand wand. - Avoid arc welding equipment, MRI testing (magnetic resonance imaging), TENS units (transcutaneous nerve stimulators).  Call the office for questions about other devices. - Avoid electrical appliances that are in poor condition or are not properly grounded. - Microwave ovens are safe to be near or to operate.

## 2015-11-20 NOTE — Discharge Summary (Addendum)
ELECTROPHYSIOLOGY PROCEDURE DISCHARGE SUMMARY    Patient ID: Brad [name],  MRN: [id number], DOB/AGE: Feb 01, 1928 82 y.o.  Admit date: 11/20/2015 Discharge date: 11/22/2015  Primary Care Physician: [name], MD Primary Cardiologist: Harrington Challenger Electrophysiologist: Lovena Le  Primary Discharge Diagnosis:  Sick sinus syndrome and intermittent AV block status post pacemaker implantation this admission  Secondary Discharge Diagnosis:  1.  HTN 2.  COPD 3.  CAD 4.  Hyperlipidemia 5.  PAD 6.  Macular degeneration 7.  Paroxysmal atrial fibrillation  Allergies  Allergen Reactions  . Aspirin Shortness Of Breath and Swelling  . Atorvastatin Other (See Comments)    Leg pain, feet pain  . Cephalexin Other (See Comments)    unknown     Procedures This Admission:  1.  Implantation of a MDT dual chamber PPM on 11/20/15 by Dr Lovena Le.  The patient received a MDT PPM with model number 5076 right atrial lead and 3830 right ventricular lead (His Bundle pacing). There were no immediate post procedure complications. 2.  CXR on 11/21/15 demonstrated no pneumothorax status post device implantation.   Brief HPI: Brad [name] is a 82 y.o. male was referred to electrophysiology in the outpatient setting for consideration of PPM implantation.  Past medical history includes sick sinus syndrome as well as intermittent AV block.  The patient has had symptomatic bradycardia without reversible causes identified.  Risks, benefits, and alternatives to PPM implantation were reviewed with the patient who wished to proceed.   Hospital Course:  The patient was admitted and underwent implantation of a MDT dual chamber pacemaker with details as outlined above.  He  was monitored on telemetry which demonstrated atrial fibrillation with RVR.  He was kept an extra day due to volume overload and uncontrolled atrial fib. His Rate control was titrated  Left chest was without hematoma or ecchymosis.  The device was  interrogated and found to be functioning normally.  CXR was obtained and demonstrated no pneumothorax status post device implantation.  Wound care, arm mobility, and restrictions were reviewed with the patient.  The patient was examined and considered stable for discharge to home.   This patients CHA2DS2-VASc Score and unadjusted Ischemic Stroke Rate (% per year) is equal to 3.2 % stroke rate/year from a score of 3 Above score calculated as 1 point each if present [CHF, HTN, DM, Vascular=MI/PAD/Aortic Plaque, Age if 65-74, or Male] Above score calculated as 2 points each if present [Age > 75, or Stroke/TIA/TE]    Physical Exam: Filed Vitals:   11/22/15 0500 11/22/15 0600 11/22/15 0633 11/22/15 0835  BP:  120/63 120/63   Pulse:      Temp:    97.4 F (36.3 C)  TempSrc:    Oral  Resp: 19 21    Height:      Weight:      SpO2:    95%    GEN- The patient is elderly appearing, alert and oriented x 3 today.   HEENT: normocephalic, atraumatic; sclera clear, conjunctiva pink; hearing intact; oropharynx clear; neck supple  Lungs- Clear to ausculation bilaterally, normal work of breathing.  No wheezes, rales, rhonchi Heart- Irregular rate and rhythm  GI- soft, non-tender, non-distended, bowel sounds present  Extremities- no clubbing, cyanosis, or edema; DP/PT/radial pulses 2+ bilaterally MS- no significant deformity or atrophy Skin- warm and dry, no rash or lesion, left chest without hematoma/ecchymosis Psych- euthymic mood, full affect Neuro- strength and sensation are intact   Labs:   Lab Results  Component Value  Date   WBC 6.5 11/20/2015   HGB 14.6 11/20/2015   HCT 44.9 11/20/2015   MCV 96.8 11/20/2015   PLT 178 11/20/2015     Recent Labs Lab 11/20/15 1011  NA 141  K 4.1  CL 108  CO2 26  BUN 14  CREATININE 0.71  CALCIUM 9.4  GLUCOSE 103*    Discharge Medications:    Medication List    STOP taking these medications        amLODipine 5 MG tablet  Commonly known  as:  NORVASC     clopidogrel 75 MG tablet  Commonly known as:  PLAVIX      TAKE these medications        albuterol 108 (90 Base) MCG/ACT inhaler  Commonly known as:  PROVENTIL HFA;VENTOLIN HFA  Inhale 2 puffs into the lungs every 6 (six) hours as needed for wheezing or shortness of breath.     apixaban 5 MG Tabs tablet  Commonly known as:  ELIQUIS  Take 1 tablet (5 mg total) by mouth 2 (two) times daily.     celecoxib 200 MG capsule  Commonly known as:  CELEBREX  TAKE 1 CAPSULE (200 MG TOTAL) BY MOUTH DAILY. WITH FOOD. FOR JOINT PAIN     clobetasol 0.05 % external solution  Commonly known as:  TEMOVATE  Apply 1 application topically 2 (two) times daily. Apply 2-3 drops to AA of scalp twice a day     diltiazem 120 MG 24 hr capsule  Commonly known as:  CARDIZEM CD  Take 1 capsule (120 mg total) by mouth daily.     fish oil-omega-3 fatty acids 1000 MG capsule  Take 1 g by mouth daily.     fluocinolone 0.01 % cream  Commonly known as:  VANOS  Apply 1 application topically daily as needed (for skin).     furosemide 20 MG tablet  Commonly known as:  LASIX  Take 1 tablet (20 mg total) by mouth daily.     isosorbide mononitrate 30 MG 24 hr tablet  Commonly known as:  IMDUR  TAKE 1 TABLET BY MOUTH ONCE A DAY     ketoconazole 2 % shampoo  Commonly known as:  NIZORAL  Use 2-3 times weekly. Leave on for 5 min. Rinse.     loratadine 10 MG tablet  Commonly known as:  CLARITIN  Take 1 tablet (10 mg total) by mouth daily.     metoprolol tartrate 25 MG tablet  Commonly known as:  LOPRESSOR  Take 1 tablet (25 mg total) by mouth 2 (two) times daily.     oxyCODONE-acetaminophen 5-325 MG tablet  Commonly known as:  PERCOCET/ROXICET  Take 1 tablet by mouth every 6 (six) hours as needed for severe pain.     pantoprazole 20 MG tablet  Commonly known as:  PROTONIX  Take 20 mg by mouth daily as needed for heartburn or indigestion.     POCKET SPACER Devi  1 each by Does not apply  route daily as needed.     potassium chloride SA 20 MEQ tablet  Commonly known as:  K-DUR,KLOR-CON  Take 1 tablet (20 mEq total) by mouth daily.     PRESERVISION AREDS PO  Take 1 tablet by mouth daily.     rosuvastatin 10 MG tablet  Commonly known as:  CRESTOR  Take 5 mg by mouth daily.     tamsulosin 0.4 MG Caps capsule  Commonly known as:  FLOMAX  Take 2 capsules (0.8 mg  total) by mouth at bedtime.     triamcinolone cream 0.1 %  Commonly known as:  KENALOG  Apply 1 application topically 2 (two) times daily. Apply to AA of trunk, arms, legs. Avoid underarms, genitals, face, groin.     TURMERIC CURCUMIN PO  Take 1 tablet by mouth daily.        Disposition:  Discharge Instructions    Diet - low sodium heart healthy    Complete by:  As directed      Increase activity slowly    Complete by:  As directed           Follow-up Information    Follow up with [facility] On 12/04/2015.   Specialty:  Cardiology   Why:  at [facility] for wound check    Contact information:   [address] [place] [phone]      Follow up with Baldwin Jamaica, PA-C On 12/20/2015.   Specialty:  Cardiology   Why:  at 9:30AM    Contact information:   [place] [address] [phone]       Duration of Discharge Encounter: Greater than 30 minutes including physician time.  Signed, Chanetta Marshall, NP 11/22/2015 8:59 AM  EP Attending  Patient seen and examined. He is stable for DC home today. Usual followup. Will attempt a strategy of rate control. He will start metoprolol as above. His atrial fib appears controlled and his acute diastolic heart failure is better after a nice diuresis yesterday.  Mikle Bosworth.D.

## 2015-11-21 ENCOUNTER — Encounter (HOSPITAL_COMMUNITY): Payer: Self-pay | Admitting: Nurse Practitioner

## 2015-11-21 ENCOUNTER — Ambulatory Visit (HOSPITAL_COMMUNITY): Payer: Medicare Other

## 2015-11-21 DIAGNOSIS — K219 Gastro-esophageal reflux disease without esophagitis: Secondary | ICD-10-CM | POA: Diagnosis not present

## 2015-11-21 DIAGNOSIS — I4891 Unspecified atrial fibrillation: Secondary | ICD-10-CM | POA: Diagnosis present

## 2015-11-21 DIAGNOSIS — E785 Hyperlipidemia, unspecified: Secondary | ICD-10-CM | POA: Diagnosis not present

## 2015-11-21 DIAGNOSIS — J449 Chronic obstructive pulmonary disease, unspecified: Secondary | ICD-10-CM | POA: Diagnosis not present

## 2015-11-21 DIAGNOSIS — I480 Paroxysmal atrial fibrillation: Secondary | ICD-10-CM | POA: Diagnosis not present

## 2015-11-21 DIAGNOSIS — I4439 Other atrioventricular block: Secondary | ICD-10-CM | POA: Diagnosis not present

## 2015-11-21 DIAGNOSIS — I5031 Acute diastolic (congestive) heart failure: Secondary | ICD-10-CM | POA: Diagnosis not present

## 2015-11-21 DIAGNOSIS — H3530 Unspecified macular degeneration: Secondary | ICD-10-CM | POA: Diagnosis not present

## 2015-11-21 DIAGNOSIS — I739 Peripheral vascular disease, unspecified: Secondary | ICD-10-CM | POA: Diagnosis not present

## 2015-11-21 DIAGNOSIS — I110 Hypertensive heart disease with heart failure: Secondary | ICD-10-CM | POA: Diagnosis not present

## 2015-11-21 DIAGNOSIS — I495 Sick sinus syndrome: Secondary | ICD-10-CM | POA: Diagnosis not present

## 2015-11-21 DIAGNOSIS — Z79899 Other long term (current) drug therapy: Secondary | ICD-10-CM | POA: Diagnosis not present

## 2015-11-21 DIAGNOSIS — I2510 Atherosclerotic heart disease of native coronary artery without angina pectoris: Secondary | ICD-10-CM | POA: Diagnosis not present

## 2015-11-21 DIAGNOSIS — Z45018 Encounter for adjustment and management of other part of cardiac pacemaker: Secondary | ICD-10-CM | POA: Diagnosis not present

## 2015-11-21 MED ORDER — DILTIAZEM HCL 30 MG PO TABS
30.0000 mg | ORAL_TABLET | Freq: Four times a day (QID) | ORAL | Status: DC
  Administered 2015-11-21 – 2015-11-22 (×4): 30 mg via ORAL
  Filled 2015-11-21 (×4): qty 1

## 2015-11-21 MED ORDER — DILTIAZEM HCL 100 MG IV SOLR
5.0000 mg/h | INTRAVENOUS | Status: DC
  Administered 2015-11-21: 5 mg/h via INTRAVENOUS
  Filled 2015-11-21: qty 100

## 2015-11-21 MED ORDER — FUROSEMIDE 10 MG/ML IJ SOLN
20.0000 mg | Freq: Every day | INTRAMUSCULAR | Status: DC
  Administered 2015-11-21 – 2015-11-22 (×2): 20 mg via INTRAVENOUS
  Filled 2015-11-21 (×2): qty 2

## 2015-11-21 MED ORDER — APIXABAN 5 MG PO TABS
5.0000 mg | ORAL_TABLET | Freq: Two times a day (BID) | ORAL | Status: DC
  Administered 2015-11-21 – 2015-11-22 (×3): 5 mg via ORAL
  Filled 2015-11-21 (×3): qty 1

## 2015-11-21 MED ORDER — DILTIAZEM LOAD VIA INFUSION
5.0000 mg | Freq: Once | INTRAVENOUS | Status: AC
  Administered 2015-11-21: 07:00:00 5 mg via INTRAVENOUS
  Filled 2015-11-21: qty 5

## 2015-11-21 MED ORDER — METOPROLOL TARTRATE 25 MG PO TABS
25.0000 mg | ORAL_TABLET | Freq: Two times a day (BID) | ORAL | Status: DC
  Administered 2015-11-21 – 2015-11-22 (×3): 25 mg via ORAL
  Filled 2015-11-21 (×3): qty 1

## 2015-11-21 MED ORDER — ALPRAZOLAM 0.25 MG PO TABS
0.2500 mg | ORAL_TABLET | Freq: Three times a day (TID) | ORAL | Status: DC | PRN
  Administered 2015-11-21 (×2): 0.25 mg via ORAL
  Filled 2015-11-21 (×2): qty 1

## 2015-11-21 NOTE — Progress Notes (Signed)
Patient Name: Brad [name] Date of Encounter: 11/21/2015   SUBJECTIVE  Was "swimi headed" earlier this morning, nor resolved. No difficulty speaking or weakness of one side of body.   CURRENT MEDS . furosemide  20 mg Oral Daily  . isosorbide mononitrate  30 mg Oral Daily  . loratadine  10 mg Oral Daily  . omega-3 acid ethyl esters  1 g Oral Daily  . potassium chloride SA  20 mEq Oral Daily  . rosuvastatin  5 mg Oral q1800  . tamsulosin  0.8 mg Oral QHS    OBJECTIVE  Filed Vitals:   11/21/15 0100 11/21/15 0200 11/21/15 0418 11/21/15 0500  BP:   130/88 115/73  Pulse: 109 117 107 101  Temp:   98.1 F (36.7 C)   TempSrc:   Oral   Resp: 20 14 22 26   Height:      Weight:      SpO2: 96% 92% 95% 94%    Intake/Output Summary (Last 24 hours) at 11/21/15 0711 Last data filed at 11/21/15 0207  Gross per 24 hour  Intake    120 ml  Output   1700 ml  Net  -1580 ml   Filed Weights   11/20/15 0951  Weight: 196 lb (88.905 kg)    PHYSICAL EXAM  General: Pleasant, NAD. Neuro: Alert and oriented X 3. Moves all extremities spontaneously. Psych: Normal affect. HEENT:  Normal  Neck: Supple without bruits or JVD. Lungs:  Resp regular and unlabored, CTA. Heart: Ir Ir tachycardiac no s3, s4, or murmurs. Abdomen: Soft, non-tender, non-distended, BS + x 4.  Extremities: No clubbing, cyanosis or edema. DP/PT/Radials 2+ and equal bilaterally.  Accessory Clinical Findings  CBC  Recent Labs  11/20/15 1011  WBC 6.5  HGB 14.6  HCT 44.9  MCV 96.8  PLT 0000000   Basic Metabolic Panel  Recent Labs  11/20/15 1011  NA 141  K 4.1  CL 108  CO2 26  GLUCOSE 103*  BUN 14  CREATININE 0.71  CALCIUM 9.4    TELE  Afib at rate of 120s  Radiology/Studies  No results found.  ASSESSMENT AND PLAN  1. Sinus node dysfunction (HCC) - s/p [employer] pacemaker implantation yesterday.  2. PAF - S/p cardioversion during procedure, however again went to afib. Overnight rate  gradually increased up to 120s. Started IV cardizem.  MD to decide timing to initiate [place].    3. Spinning room - The patient describes as "swimi headed" since syncope episode. Intermittent. No difficulty speaking or weakness of one side of body.    Jarrett Soho PA-C Pager [phone]  EP Attending  Patient seen and examined. Agree with above. He had a difficult night last night. Atrial fib with CVR then RVR with associated sob and weakness. His exam reveals a IRIR rhythm and his lungs are clear except for scattered basilar rales. No increased work of breathing. Tele - atrial fib with CVR and RVR.  CXR - demonstrates no PTX.  A/P 1. Atrial fib - this is a new diagnosis. Will add a beta blocker and continue IV calcium channel blocker. Will start Eliquis today.  2. Acute diastolic heart failure - he has some increased dyspnea with his uncontrolled atrial fib. Will give IV lasix. 3. Sinus node dysfunction - he is s/p PPM insertion. His device is working normally. 4. PPM - his PPM interogation demonstrates normal device function. 5. Disposition - we will hold off on DC and take it day by day.  Mikle Bosworth.D.

## 2015-11-21 NOTE — Plan of Care (Signed)
Problem: Activity: Goal: Ability to tolerate increased activity will improve  Pt anxious, wanting to go home, no PRNs ordered for anxiety. Alert and oriented X3 to situation, time, but not place. confusion intermitant according to family but worse today. Cardiology made aware.  Family with pt at bedside. Verbal orders from Dr. Curly Shores to DC IV Cardizem, transition to PO 30mg  q6.

## 2015-11-21 NOTE — Progress Notes (Signed)
PT Cancellation Note  Patient Details Name: Brad [name] MRN: [id number] DOB: 01/21/1928   Cancelled Treatment:    Reason Eval/Treat Not Completed: Medical issues which prohibited therapy; RN reports pt in a-fib w/RVR and on Cardizem drip.  Also noted order to start 11/22/15.  Will attempt again tomorrow.    Reginia Naas 11/21/2015, 9:04 AM  Magda Kiel, Sesser 11/21/2015

## 2015-11-21 NOTE — Progress Notes (Signed)
On call cardiology paged; pt son states pt is c/o room spinning, and dizziness.  Upon assessment pt states something is "very wrong" and he does not feel well, dizzy. Repositioned the patient  lying down supine. States symptoms are some better. Paged on call. Notified NP of SBAR and new onset a.fib, no meds ordered for rate control. Pharmacy called to send stat meds.

## 2015-11-22 ENCOUNTER — Telehealth: Payer: Self-pay | Admitting: Internal Medicine

## 2015-11-22 ENCOUNTER — Encounter (HOSPITAL_COMMUNITY): Payer: Self-pay | Admitting: Internal Medicine

## 2015-11-22 DIAGNOSIS — I495 Sick sinus syndrome: Secondary | ICD-10-CM | POA: Diagnosis not present

## 2015-11-22 MED ORDER — APIXABAN 5 MG PO TABS: 5.0000 mg | ORAL_TABLET | Freq: Two times a day (BID) | ORAL | Status: DC

## 2015-11-22 MED ORDER — DILTIAZEM HCL ER COATED BEADS 120 MG PO CP24: 120.0000 mg | ORAL_CAPSULE | Freq: Every day | ORAL | Status: DC

## 2015-11-22 MED ORDER — METOPROLOL TARTRATE 25 MG PO TABS: 25.0000 mg | ORAL_TABLET | Freq: Two times a day (BID) | ORAL | Status: DC

## 2015-11-22 MED FILL — Sodium Chloride Irrigation Soln 0.9%: Qty: 500 | Status: AC

## 2015-11-22 MED FILL — Gentamicin Sulfate Inj 40 MG/ML: INTRAMUSCULAR | Qty: 2 | Status: AC

## 2015-11-22 NOTE — Telephone Encounter (Addendum)
Pt's son [name] calling regarding pt having implant placed 11-20-15, and needing to get home health care in for him-pls advise

## 2015-11-22 NOTE — Progress Notes (Signed)
Discharge teaching and instructions reviewed. VSS. Family at bedside. New medications reviewed. Pt alert x 3, does not know where he is but is aware of situation (why he's "sick"). Family has no further questions at this time. Going home via son, staying at home with son.

## 2015-11-22 NOTE — Progress Notes (Signed)
Patient has been restless and confused. Is alert to self and situation but not time/place. Gave patient prn Xanax. Did not help patient. Son is in the room. Son states that this is not his baseline and that patient lives by himself at home. Paged cardiology in order to give patient something to sleep. Did not received a call back or any order. Patient would not stay in bed or recliner chair in room. Took patient out to nurse's station in recliner chair and is sitting next to me. Will continue to monitor.

## 2015-11-22 NOTE — Telephone Encounter (Signed)
I spoke with the patient's son, Brad [name]. The patient had a PPM implant done on 11/20/15. Per Brad [name], there was discussion about the patient having home health services started once he left the hospital from the procedure. The patient is staying with Brad [name] in [place] x 1 week post procedure, then will going back to [facility]. I advised Brad [name], I will need to review with Dr. Lovena Le to see if he is willing to initiate any home health services for the patient. We will call him back with MD recommendations.

## 2015-11-23 NOTE — Telephone Encounter (Signed)
Spoke w/Dr. Lovena Le regarding [facility] services for [name].  He was discharged yesterday from [facility] implant.  Per Dr. Lovena Le may have [facility] services x 1 week for ambulation, strength and balance.  Called [facility] and was advised to fax Rx with information, including demographics and d/c summary.  Faxed Rx for Home Health Evaluation and treatment for ambulation, strength and balance x 1 week to [phone]. Notified son, [name], that [facility] will be calling him to set up appointments.  His address is [address] [place] [address].

## 2015-11-24 ENCOUNTER — Telehealth: Payer: Self-pay | Admitting: Internal Medicine

## 2015-11-24 DIAGNOSIS — I495 Sick sinus syndrome: Secondary | ICD-10-CM | POA: Diagnosis not present

## 2015-11-24 DIAGNOSIS — I480 Paroxysmal atrial fibrillation: Secondary | ICD-10-CM | POA: Diagnosis not present

## 2015-11-24 DIAGNOSIS — I4430 Unspecified atrioventricular block: Secondary | ICD-10-CM | POA: Diagnosis not present

## 2015-11-24 DIAGNOSIS — Z48812 Encounter for surgical aftercare following surgery on the circulatory system: Secondary | ICD-10-CM | POA: Diagnosis not present

## 2015-11-24 DIAGNOSIS — Z950 Presence of cardiac pacemaker: Secondary | ICD-10-CM | POA: Diagnosis not present

## 2015-11-24 DIAGNOSIS — I739 Peripheral vascular disease, unspecified: Secondary | ICD-10-CM | POA: Diagnosis not present

## 2015-11-24 DIAGNOSIS — I10 Essential (primary) hypertension: Secondary | ICD-10-CM | POA: Diagnosis not present

## 2015-11-24 DIAGNOSIS — H3530 Unspecified macular degeneration: Secondary | ICD-10-CM | POA: Diagnosis not present

## 2015-11-24 DIAGNOSIS — J449 Chronic obstructive pulmonary disease, unspecified: Secondary | ICD-10-CM | POA: Diagnosis not present

## 2015-11-24 DIAGNOSIS — E785 Hyperlipidemia, unspecified: Secondary | ICD-10-CM | POA: Diagnosis not present

## 2015-11-24 DIAGNOSIS — I2510 Atherosclerotic heart disease of native coronary artery without angina pectoris: Secondary | ICD-10-CM | POA: Diagnosis not present

## 2015-11-24 NOTE — Telephone Encounter (Signed)
Pts son called back. The device clinic advises to avoid an Psychiatrist plant or power station. Pt son given number to [employer] for more specific information. Pts son voiced understanding.

## 2015-11-24 NOTE — Telephone Encounter (Signed)
New message   Pt son wants to know if he should take his dad to a Financial trader station in [place]

## 2015-11-28 DIAGNOSIS — I495 Sick sinus syndrome: Secondary | ICD-10-CM | POA: Diagnosis not present

## 2015-11-28 DIAGNOSIS — I480 Paroxysmal atrial fibrillation: Secondary | ICD-10-CM | POA: Diagnosis not present

## 2015-11-28 DIAGNOSIS — I4430 Unspecified atrioventricular block: Secondary | ICD-10-CM | POA: Diagnosis not present

## 2015-11-28 DIAGNOSIS — Z48812 Encounter for surgical aftercare following surgery on the circulatory system: Secondary | ICD-10-CM | POA: Diagnosis not present

## 2015-11-28 DIAGNOSIS — I10 Essential (primary) hypertension: Secondary | ICD-10-CM | POA: Diagnosis not present

## 2015-11-28 DIAGNOSIS — I2510 Atherosclerotic heart disease of native coronary artery without angina pectoris: Secondary | ICD-10-CM | POA: Diagnosis not present

## 2015-11-30 DIAGNOSIS — Z48812 Encounter for surgical aftercare following surgery on the circulatory system: Secondary | ICD-10-CM | POA: Diagnosis not present

## 2015-11-30 DIAGNOSIS — I4430 Unspecified atrioventricular block: Secondary | ICD-10-CM | POA: Diagnosis not present

## 2015-11-30 DIAGNOSIS — I480 Paroxysmal atrial fibrillation: Secondary | ICD-10-CM | POA: Diagnosis not present

## 2015-11-30 DIAGNOSIS — I495 Sick sinus syndrome: Secondary | ICD-10-CM | POA: Diagnosis not present

## 2015-11-30 DIAGNOSIS — I10 Essential (primary) hypertension: Secondary | ICD-10-CM | POA: Diagnosis not present

## 2015-11-30 DIAGNOSIS — I2510 Atherosclerotic heart disease of native coronary artery without angina pectoris: Secondary | ICD-10-CM | POA: Diagnosis not present

## 2015-12-02 ENCOUNTER — Other Ambulatory Visit: Payer: Self-pay | Admitting: Physician Assistant

## 2015-12-04 ENCOUNTER — Ambulatory Visit (INDEPENDENT_AMBULATORY_CARE_PROVIDER_SITE_OTHER): Payer: Medicare Other | Admitting: *Deleted

## 2015-12-04 ENCOUNTER — Encounter: Payer: Self-pay | Admitting: Internal Medicine

## 2015-12-04 DIAGNOSIS — R001 Bradycardia, unspecified: Secondary | ICD-10-CM

## 2015-12-04 DIAGNOSIS — I480 Paroxysmal atrial fibrillation: Secondary | ICD-10-CM

## 2015-12-04 LAB — CUP PACEART INCLINIC DEVICE CHECK
Battery Remaining Longevity: 128 mo
Battery Voltage: 3.09 V
Brady Statistic AS VP Percent: 0.89 %
Brady Statistic RA Percent Paced: 81.39 %
Implantable Lead Implant Date: 20170703
Implantable Lead Location: 753860
Implantable Lead Model: 5076
Lead Channel Impedance Value: 342 Ohm
Lead Channel Pacing Threshold Amplitude: 0.625 V
Lead Channel Pacing Threshold Pulse Width: 0.4 ms
Lead Channel Pacing Threshold Pulse Width: 0.4 ms
Lead Channel Sensing Intrinsic Amplitude: 4.375 mV
Lead Channel Sensing Intrinsic Amplitude: 8.375 mV
Lead Channel Setting Pacing Pulse Width: 1 ms
MDC IDC LEAD IMPLANT DT: 20170703
MDC IDC LEAD LOCATION: 753859
MDC IDC MSMT LEADCHNL RA IMPEDANCE VALUE: 456 Ohm
MDC IDC MSMT LEADCHNL RA SENSING INTR AMPL: 2.125 mV
MDC IDC MSMT LEADCHNL RV IMPEDANCE VALUE: 380 Ohm
MDC IDC MSMT LEADCHNL RV IMPEDANCE VALUE: 475 Ohm
MDC IDC MSMT LEADCHNL RV PACING THRESHOLD AMPLITUDE: 0.75 V
MDC IDC MSMT LEADCHNL RV SENSING INTR AMPL: 7 mV
MDC IDC SESS DTM: 20170717155718
MDC IDC SET LEADCHNL RA PACING AMPLITUDE: 3.5 V
MDC IDC SET LEADCHNL RV PACING AMPLITUDE: 3.5 V
MDC IDC SET LEADCHNL RV SENSING SENSITIVITY: 0.9 mV
MDC IDC STAT BRADY AP VP PERCENT: 76.08 %
MDC IDC STAT BRADY AP VS PERCENT: 5.31 %
MDC IDC STAT BRADY AS VS PERCENT: 17.72 %
MDC IDC STAT BRADY RV PERCENT PACED: 76.97 %

## 2015-12-04 NOTE — Progress Notes (Signed)
Wound check appointment. Steri-strips removed. Wound without redness or edema. Incision edges approximated, wound well healed. Normal device function. Thresholds, sensing, and impedances consistent with implant measurements. Device programmed at 3.5V for extra safety margin until 3 month visit. Histogram distribution appropriate for patient and level of activity. 0.7% AT/AF burden + Eliquis. No high ventricular rates noted. Patient educated about wound care, arm mobility, lifting restrictions. ROV on 8/2 with RU and in 3 months with GT.

## 2015-12-05 DIAGNOSIS — I4430 Unspecified atrioventricular block: Secondary | ICD-10-CM | POA: Diagnosis not present

## 2015-12-05 DIAGNOSIS — I480 Paroxysmal atrial fibrillation: Secondary | ICD-10-CM | POA: Diagnosis not present

## 2015-12-05 DIAGNOSIS — I10 Essential (primary) hypertension: Secondary | ICD-10-CM | POA: Diagnosis not present

## 2015-12-05 DIAGNOSIS — I495 Sick sinus syndrome: Secondary | ICD-10-CM | POA: Diagnosis not present

## 2015-12-05 DIAGNOSIS — Z48812 Encounter for surgical aftercare following surgery on the circulatory system: Secondary | ICD-10-CM | POA: Diagnosis not present

## 2015-12-05 DIAGNOSIS — I2510 Atherosclerotic heart disease of native coronary artery without angina pectoris: Secondary | ICD-10-CM | POA: Diagnosis not present

## 2015-12-08 DIAGNOSIS — I4430 Unspecified atrioventricular block: Secondary | ICD-10-CM | POA: Diagnosis not present

## 2015-12-08 DIAGNOSIS — I495 Sick sinus syndrome: Secondary | ICD-10-CM | POA: Diagnosis not present

## 2015-12-08 DIAGNOSIS — I10 Essential (primary) hypertension: Secondary | ICD-10-CM | POA: Diagnosis not present

## 2015-12-08 DIAGNOSIS — I2510 Atherosclerotic heart disease of native coronary artery without angina pectoris: Secondary | ICD-10-CM | POA: Diagnosis not present

## 2015-12-08 DIAGNOSIS — I480 Paroxysmal atrial fibrillation: Secondary | ICD-10-CM | POA: Diagnosis not present

## 2015-12-08 DIAGNOSIS — Z48812 Encounter for surgical aftercare following surgery on the circulatory system: Secondary | ICD-10-CM | POA: Diagnosis not present

## 2015-12-14 DIAGNOSIS — I10 Essential (primary) hypertension: Secondary | ICD-10-CM | POA: Diagnosis not present

## 2015-12-14 DIAGNOSIS — I4430 Unspecified atrioventricular block: Secondary | ICD-10-CM | POA: Diagnosis not present

## 2015-12-14 DIAGNOSIS — Z48812 Encounter for surgical aftercare following surgery on the circulatory system: Secondary | ICD-10-CM | POA: Diagnosis not present

## 2015-12-14 DIAGNOSIS — I495 Sick sinus syndrome: Secondary | ICD-10-CM | POA: Diagnosis not present

## 2015-12-14 DIAGNOSIS — I480 Paroxysmal atrial fibrillation: Secondary | ICD-10-CM | POA: Diagnosis not present

## 2015-12-14 DIAGNOSIS — I2510 Atherosclerotic heart disease of native coronary artery without angina pectoris: Secondary | ICD-10-CM | POA: Diagnosis not present

## 2015-12-20 ENCOUNTER — Telehealth: Payer: Self-pay | Admitting: Internal Medicine

## 2015-12-20 ENCOUNTER — Ambulatory Visit (INDEPENDENT_AMBULATORY_CARE_PROVIDER_SITE_OTHER): Payer: Medicare Other | Admitting: Physician Assistant

## 2015-12-20 ENCOUNTER — Encounter: Payer: Self-pay | Admitting: Physician Assistant

## 2015-12-20 VITALS — BP 122/70 | HR 65 | Ht 66.0 in | Wt 194.0 lb

## 2015-12-20 DIAGNOSIS — I10 Essential (primary) hypertension: Secondary | ICD-10-CM

## 2015-12-20 DIAGNOSIS — I2510 Atherosclerotic heart disease of native coronary artery without angina pectoris: Secondary | ICD-10-CM | POA: Diagnosis not present

## 2015-12-20 DIAGNOSIS — I495 Sick sinus syndrome: Secondary | ICD-10-CM | POA: Diagnosis not present

## 2015-12-20 DIAGNOSIS — I480 Paroxysmal atrial fibrillation: Secondary | ICD-10-CM

## 2015-12-20 DIAGNOSIS — I2584 Coronary atherosclerosis due to calcified coronary lesion: Secondary | ICD-10-CM

## 2015-12-20 NOTE — Patient Instructions (Signed)
Medication Instructions:   Your physician recommends that you continue on your current medications as directed. Please refer to the Current Medication list given to you today.   If you need a refill on your cardiac medications before your next appointment, please call your pharmacy.  Labwork: NONE ORDER TODAY'    Testing/Procedures: NONE ORDER TODAY    Follow-Up:   Your physician wants you to follow-up in: [place].You will receive a reminder letter in the mail two months in advance. If you don't receive a letter, please call our office to schedule the follow-up appointment.   Remote monitoring is used to monitor your Pacemaker of ICD from home. This monitoring reduces the number of office visits required to check your device to one time per year. It allows [place] to keep an eye on the functioning of your device to ensure it is working properly. You are scheduled for a device check from home on .  03/21/2016..You may send your transmission at any time that day. If you have a wire/less device, the transmission will be sent automatically. After your physician reviews your transmission, you will receive a postcard with your next transmission date.    Any Other Special Instructions Will Be Listed Below (If Applicable).

## 2015-12-20 NOTE — Progress Notes (Signed)
Cardiology Office Note Date:  12/20/2015  Patient ID:  Brad, [name] 1927-07-15, MRN [id number] PCP:  [name], MD  Cardiologist:  Dr. Harrington Challenger Electrophysiologist: Dr. Lovena Le    Chief Complaint: post hospital, pacer implant, new AF  History of Present Illness: Brad [name] is a 82 y.o. male with history of HTN, COPD, CAD (last [facility] 2005), HLD, macular degeneration, AAA repair 2003, new PAFib, SSSx, AVBlock, s/p PPM implant 11/20/15 kept in the hospital an additional day due to RAFib and volume OL.    He comes in today to be see for Dr. Lovena Le, is accompanied by his son, reports feeling well.  He has baseline chronic back pain that was exacerbated by his fall in back march, denies any kind of CP, palpitations or SOB, denies any nighttime symptoms, no PND or orthopnea, sleeps comfortably with 1 pillow, no near syncope or syncope, he will occassionaly feel a little lightheaded upon standing.  He denies any bleeding or signs of bleeding, has switched to an Copy.  We discussed today bleeding precautions, signs of bleeding.  Device information: MDT dual chamber PPM, (RV lead is in the HIS position) implanted 11/20/15, Dr. Lovena Le, SSSx   Past Medical History:  Diagnosis Date  . Ascending aortic aneurysm (HCC)    Dr. Roxan Hockey  . CAD (coronary artery disease)    s/p stent to OM2 in past;  Last LHC 3/05: EF 60%, left RA occluded, right RA ok, prox to mid LAD 60%, oD1 75-80%, oD2 small 75%, pOM1 50%, OM2 stent ok with 50% before stent, mRCA 30%, dRCA 30%, PDA 70%.  Last dobutamine myoview 5/06: no scar or ischemia, EF 60%.  . Chronic lower back pain   . COPD (chronic obstructive pulmonary disease) ([place])   . DJD (degenerative joint disease)   . Enlarged prostate   . Ethanolism ([place])   . GERD (gastroesophageal reflux disease)   . Hernia, abdominal    "he has ~ 4; wears binder" (11/20/2015)  . HLD (hyperlipidemia)   . HTN (hypertension)    Last echo 1/07:  Normal LVF, LV  thickness upper limits of normal, mild aortic root dilatation, mild to mod MAC, mild LAE, borderline RVH, mild RAE.  . Macular degeneration   . PAD (peripheral artery disease) ([place])   . Paroxysmal atrial fibrillation (HCC)   . Psoriasis   . S/P AAA repair   . Sinus node dysfunction (HCC)    a. s/p MDT dual chamber PPM with His Bundle pacing  . Tobacco abuse   . Unilateral congenital absence of kidney     Past Surgical History:  Procedure Laterality Date  . APPENDECTOMY    . CARPAL TUNNEL RELEASE Right 12/26/2014   Procedure: RIGHT ENDOSCOPIC CARPAL TUNNEL SYNDROME RELEASE;  Surgeon: Milly Jakob, MD;  Location: [facility];  Service: Orthopedics;  Laterality: Right;  . CATARACT EXTRACTION, BILATERAL Bilateral   . CORONARY ANGIOPLASTY WITH STENT PLACEMENT     s/p stent to OM2 in past;   . EP IMPLANTABLE DEVICE N/A 11/20/2015   Procedure: Pacemaker Implant;  Surgeon: Evans Lance, MD;  Location: [facility];  Service: Cardiovascular;  Laterality: N/A;  . HERNIA REPAIR    . INSERT / REPLACE / REMOVE PACEMAKER  11/20/2015  . RESECTION AND GRAFTING OF INFARENAL  ABDOMINAL  AORTIC ANEURYSM  05/03/2002    Current Outpatient Prescriptions  Medication Sig Dispense Refill  . acetaminophen (TYLENOL) 500 MG chewable tablet Chew 500 mg by mouth every  6 (six) hours as needed for pain.    Marland Kitchen albuterol (PROVENTIL HFA;VENTOLIN HFA) 108 (90 Base) MCG/ACT inhaler Inhale 2 puffs into the lungs every 6 (six) hours as needed for wheezing or shortness of breath. 1 Inhaler 2  . apixaban (ELIQUIS) 5 MG TABS tablet Take 1 tablet (5 mg total) by mouth 2 (two) times daily. 60 tablet 1  . celecoxib (CELEBREX) 200 MG capsule TAKE 1 CAPSULE (200 MG TOTAL) BY MOUTH DAILY. WITH FOOD. FOR JOINT PAIN 30 capsule 5  . clobetasol (TEMOVATE) 0.05 % external solution Apply 1 application topically 2 (two) times daily. Apply 2-3 drops to AA of scalp twice a day    . diltiazem (CARDIZEM CD) 120 MG 24 hr  capsule Take 1 capsule (120 mg total) by mouth daily. 30 capsule 1  . fish oil-omega-3 fatty acids 1000 MG capsule Take 1 g by mouth daily.      . fluocinolone (VANOS) 0.01 % cream Apply 1 application topically daily as needed (for skin).     . furosemide (LASIX) 20 MG tablet Take 1 tablet (20 mg total) by mouth daily. 30 tablet 11  . isosorbide mononitrate (IMDUR) 30 MG 24 hr tablet TAKE 1 TABLET BY MOUTH ONCE A DAY 30 tablet 4  . ketoconazole (NIZORAL) 2 % shampoo Use 2-3 times weekly. Leave on for 5 min. Rinse. 120 mL 0  . loratadine (CLARITIN) 10 MG tablet TAKE 1 TABLET (10 MG TOTAL) BY MOUTH DAILY. 30 tablet 1  . metoprolol tartrate (LOPRESSOR) 25 MG tablet Take 1 tablet (25 mg total) by mouth 2 (two) times daily. 60 tablet 2  . Multiple Vitamins-Minerals (PRESERVISION AREDS PO) Take 1 tablet by mouth daily.    Marland Kitchen oxyCODONE-acetaminophen (PERCOCET/ROXICET) 5-325 MG tablet Take 1 tablet by mouth every 6 (six) hours as needed for severe pain. 30 tablet 0  . pantoprazole (PROTONIX) 20 MG tablet Take 20 mg by mouth daily as needed for heartburn or indigestion.     . potassium chloride SA (K-DUR,KLOR-CON) 20 MEQ tablet Take 1 tablet (20 mEq total) by mouth daily. 90 tablet 3  . rosuvastatin (CRESTOR) 10 MG tablet Take 5 mg by mouth daily.    Marland Kitchen Spacer/Aero-Holding Chambers (POCKET SPACER) DEVI 1 each by Does not apply route daily as needed. 1 each 0  . tamsulosin (FLOMAX) 0.4 MG CAPS capsule Take 2 capsules (0.8 mg total) by mouth at bedtime. 180 capsule 3  . triamcinolone cream (KENALOG) 0.1 % Apply 1 application topically 2 (two) times daily. Apply to AA of trunk, arms, legs. Avoid underarms, genitals, face, groin. (Patient taking differently: Apply 1 application topically 2 (two) times daily as needed (for skin). Apply to AA of trunk, arms, legs. Avoid underarms, genitals, face, groin.) 453.6 g 3  . TURMERIC CURCUMIN PO Take 1 tablet by mouth daily.     No current facility-administered medications  for this visit.     Allergies:   Aspirin; Atorvastatin; and Cephalexin   Social History:  The patient  reports that he quit smoking about 19 years ago. His smoking use included Cigarettes. He has a 130.00 pack-year smoking history. He has never used smokeless tobacco. He reports that he drinks alcohol. He reports that he does not use drugs.   Family History:  The patient's family history includes COPD in his brother and sister; Cancer in his brother, mother, and sister; Early death in his sister; Stroke in his mother.  ROS:  Please see the history of present illness.  All other systems are reviewed and otherwise negative.   PHYSICAL EXAM:  VS:  BP 122/70 (BP Location: Left Arm, Patient Position: Sitting, Cuff Size: Normal)   Pulse 65   Ht 5\' 6"  (1.676 m)   Wt 194 lb (88 kg)   BMI 31.31 kg/m  BMI: Body mass index is 31.31 kg/m. Well nourished, obese, well developed, in no acute distress  HEENT: normocephalic, atraumatic (large (said to be lipoma) right neck Neck: no JVD, carotid bruits or masses Cardiac:  RRR; no significant murmurs, no rubs, or gallops Lungs:  clear to auscultation bilaterally, no wheezing, rhonchi or rales  Abd: soft, nontender MS: no deformity, age appropriate atrophy, no tenderness where his back pain is, no brusing or obvious deformity noted Ext: no edema  Skin: warm and dry, no rash Neuro:  No gross deficits appreciated Psych: euthymic mood, full affect  PPM site is stable, is well healed, no tethering or discomfort   EKG:  Done today shows and reviewed by myself is  AV paced/fused PPM check today: normal function, HIS pacing, no AMS episodes  06/10/05: Echocardiogram SUMMARY - Overall left ventricular systolic function was normal. There were    no left ventricular regional wall motion abnormalities. Left    ventricular wall thickness was at the upper limits of normal. - The aortic valve was mildly calcified. - There was mild aortic root  dilatation. There was mild    fibrocalcific change of the aortic root. - There was mild to moderate mitral annular calcification. - The left atrium was mildly dilated. - Borderline right ventricular hypertrophy. - The right atrium was mildly dilated.  Recent Labs: 07/08/2015: ALT 12 11/20/2015: BUN 14; Creatinine, Ser 0.71; Hemoglobin 14.6; Platelets 178; Potassium 4.1; Sodium 141  07/08/2015: Chol/HDL Ratio 2.8; Cholesterol, Total 146; HDL 53; LDL Calculated 83; Triglycerides 49   CrCl cannot be calculated (Patient's most recent lab result is older than the maximum 21 days allowed.).   Wt Readings from Last 3 Encounters:  12/20/15 194 lb (88 kg)  11/22/15 187 lb 1.6 oz (84.9 kg)  10/20/15 194 lb 12.8 oz (88.4 kg)     Other studies reviewed: Additional studies/records reviewed today include: summarized above  ASSESSMENT AND PLAN:  1. SSSx s/p PPM his RV lead is in the HIS position     site is well healed     normal device function  2. PAFib     CHA2DS2Vasc is at least 4 on Eliqis     11/20/15 Creat 0.71, H/H 14/44  3. HTN     stable  4. CAD     no CP     On BB, statin, nitrate, off Plavix with Eliquis     C/w Dr. Harrington Challenger  Disposition: F/u with 3 month remote check and Dr. Lovena Le in 1 year, sooner if needed.  Current medicines are reviewed at length with the patient today.  The patient did not have any concerns regarding medicines.  Brad Lasso, PA-C 12/20/2015 10:27 AM     [address] [place] [address] [phone] (office)  [phone] (fax)

## 2015-12-20 NOTE — Telephone Encounter (Signed)
New message    Pt son would like to review the use of statin drug to see if he is receiving benefit from the drug. Please advise.

## 2015-12-22 ENCOUNTER — Encounter: Payer: Self-pay | Admitting: Internal Medicine

## 2015-12-26 NOTE — Telephone Encounter (Signed)
Reviewed with patient's son that he is taking Crestor 5 mg daily.  He is concerned because of what he reads about statins.     Advised that his dose of Crestor is a low dose and that people typically do not have the side effects that the traditional statins cause.  He reports patient has back pain and takes tylenol daily for it.  Has had this for long long time.  Advised he could stop the Crestor for 7-10 days to see if he notices any relief.  Advised usually the myalgias from statins are not isolated to the back.  Son stated patient has appointment with Dr. Harrington Challenger in September and will wait and get her opinion.  He appreciated the information provided.

## 2015-12-28 DIAGNOSIS — I2510 Atherosclerotic heart disease of native coronary artery without angina pectoris: Secondary | ICD-10-CM | POA: Diagnosis not present

## 2015-12-28 DIAGNOSIS — I10 Essential (primary) hypertension: Secondary | ICD-10-CM | POA: Diagnosis not present

## 2015-12-28 DIAGNOSIS — I480 Paroxysmal atrial fibrillation: Secondary | ICD-10-CM | POA: Diagnosis not present

## 2015-12-28 DIAGNOSIS — I495 Sick sinus syndrome: Secondary | ICD-10-CM | POA: Diagnosis not present

## 2015-12-28 DIAGNOSIS — Z48812 Encounter for surgical aftercare following surgery on the circulatory system: Secondary | ICD-10-CM | POA: Diagnosis not present

## 2015-12-28 DIAGNOSIS — I4430 Unspecified atrioventricular block: Secondary | ICD-10-CM | POA: Diagnosis not present

## 2016-01-01 ENCOUNTER — Other Ambulatory Visit: Payer: Self-pay | Admitting: Family Medicine

## 2016-01-01 DIAGNOSIS — L409 Psoriasis, unspecified: Secondary | ICD-10-CM

## 2016-01-12 DIAGNOSIS — I4430 Unspecified atrioventricular block: Secondary | ICD-10-CM | POA: Diagnosis not present

## 2016-01-12 DIAGNOSIS — I495 Sick sinus syndrome: Secondary | ICD-10-CM | POA: Diagnosis not present

## 2016-01-12 DIAGNOSIS — I2510 Atherosclerotic heart disease of native coronary artery without angina pectoris: Secondary | ICD-10-CM | POA: Diagnosis not present

## 2016-01-12 DIAGNOSIS — Z48812 Encounter for surgical aftercare following surgery on the circulatory system: Secondary | ICD-10-CM | POA: Diagnosis not present

## 2016-01-12 DIAGNOSIS — I10 Essential (primary) hypertension: Secondary | ICD-10-CM | POA: Diagnosis not present

## 2016-01-12 DIAGNOSIS — I480 Paroxysmal atrial fibrillation: Secondary | ICD-10-CM | POA: Diagnosis not present

## 2016-01-13 ENCOUNTER — Other Ambulatory Visit: Payer: Self-pay | Admitting: Family Medicine

## 2016-01-15 NOTE — Telephone Encounter (Signed)
Authorize 30 days only. Then contact the patient letting them know that they will need an appointment before any further prescriptions can be sent in. 

## 2016-01-16 DIAGNOSIS — Z23 Encounter for immunization: Secondary | ICD-10-CM | POA: Diagnosis not present

## 2016-01-18 ENCOUNTER — Encounter: Payer: Self-pay | Admitting: *Deleted

## 2016-01-20 ENCOUNTER — Other Ambulatory Visit: Payer: Self-pay | Admitting: Family Medicine

## 2016-01-26 ENCOUNTER — Other Ambulatory Visit: Payer: Self-pay | Admitting: Family Medicine

## 2016-01-31 DIAGNOSIS — H26492 Other secondary cataract, left eye: Secondary | ICD-10-CM | POA: Diagnosis not present

## 2016-01-31 DIAGNOSIS — H353132 Nonexudative age-related macular degeneration, bilateral, intermediate dry stage: Secondary | ICD-10-CM | POA: Diagnosis not present

## 2016-01-31 DIAGNOSIS — I10 Essential (primary) hypertension: Secondary | ICD-10-CM | POA: Diagnosis not present

## 2016-01-31 DIAGNOSIS — Z961 Presence of intraocular lens: Secondary | ICD-10-CM | POA: Diagnosis not present

## 2016-02-02 ENCOUNTER — Ambulatory Visit (INDEPENDENT_AMBULATORY_CARE_PROVIDER_SITE_OTHER): Payer: Medicare Other | Admitting: Nurse Practitioner

## 2016-02-02 ENCOUNTER — Encounter: Payer: Self-pay | Admitting: Nurse Practitioner

## 2016-02-02 VITALS — BP 117/75 | HR 79 | Temp 97.1°F | Ht 66.0 in | Wt 194.0 lb

## 2016-02-02 DIAGNOSIS — H6121 Impacted cerumen, right ear: Secondary | ICD-10-CM | POA: Diagnosis not present

## 2016-02-02 DIAGNOSIS — I2584 Coronary atherosclerosis due to calcified coronary lesion: Secondary | ICD-10-CM | POA: Diagnosis not present

## 2016-02-02 DIAGNOSIS — M5441 Lumbago with sciatica, right side: Secondary | ICD-10-CM | POA: Diagnosis not present

## 2016-02-02 DIAGNOSIS — I2510 Atherosclerotic heart disease of native coronary artery without angina pectoris: Secondary | ICD-10-CM

## 2016-02-02 MED ORDER — MELOXICAM 7.5 MG PO TABS: 7.5000 mg | ORAL_TABLET | Freq: Every day | ORAL | 0 refills | Status: DC

## 2016-02-02 NOTE — Progress Notes (Signed)
   Subjective:    Patient ID: Brad [name], male    DOB: 1928-05-20, 82 y.o.   MRN: [id number]  HPI  Patient in today with  C/O: - hearing loss- was thinking about being checked for hearing aid but would like to make sure ears do not need to be cleaned out first. - back pain-Patient says that he has had a bad back all of his life- He had a fall back in April and back has been hurting every since. Achy intermittent pain that radiates to right hip on occasion. Walking or standing to long increases pain- pain seems to be worse in the evenings then in the mornings. Very difficult to bend over and pick something up off floor. Rates pain 5/10 currently but can get as high as 8/10 at its worse.  Review of Systems  Constitutional: Negative.   HENT: Negative.   Respiratory: Negative.   Cardiovascular: Negative.   Gastrointestinal: Negative.   Musculoskeletal: Positive for back pain.  Neurological: Negative.  Negative for weakness.  Psychiatric/Behavioral: Negative.   All other systems reviewed and are negative.      Objective:   Physical Exam  Constitutional: He is oriented to person, place, and time. He appears well-developed and well-nourished. No distress.  HENT:  bil cerumen impaction  Cardiovascular: Normal rate.   Pulmonary/Chest: Effort normal and breath sounds normal.  Abdominal: Soft. Bowel sounds are normal.  Musculoskeletal:  Decrease ROM of lumbar spine due to pain on flexion (-) SLR on left  (+) SLR on right at 70 degrees Motor strength and sensation distally intact.  Neurological: He is alert and oriented to person, place, and time. He has normal reflexes.  Skin: Skin is warm.  Psychiatric: He has a normal mood and affect. His behavior is normal. Judgment and thought content normal.   BP 117/75   Pulse 79   Temp 97.1 F (36.2 C) (Oral)   Ht 5\' 6"  (1.676 m)   Wt 194 lb (88 kg)   BMI 31.31 kg/m         Assessment & Plan:  1. Right-sided low back pain with  right-sided sciatica Moist heat to back Can take percocet 1/2 tablet as needed - meloxicam (MOBIC) 7.5 MG tablet; Take 1 tablet (7.5 mg total) by mouth daily.  Dispense: 30 tablet; Refill: 0  2. Cerumen impaction, right Debrox 1-2 x a week   Mary-Margaret Hassell Done, FNP

## 2016-02-02 NOTE — Patient Instructions (Signed)
Cerumen Impaction The structures of the external ear canal secrete a waxy substance known as cerumen. Excess cerumen can build up in the ear canal, causing a condition known as cerumen impaction. Cerumen impaction can cause ear pain and disrupt the function of the ear. The rate of cerumen production differs for each individual. In certain individuals, the configuration of the ear canal may decrease his or her ability to naturally remove cerumen. CAUSES Cerumen impaction is caused by excessive cerumen production or buildup. RISK FACTORS  Frequent use of swabs to clean ears.  Having narrow ear canals.  Having eczema.  Being dehydrated. SIGNS AND SYMPTOMS  Diminished hearing.  Ear drainage.  Ear pain.  Ear itch. TREATMENT Treatment may involve:  Over-the-counter or prescription ear drops to soften the cerumen.  Removal of cerumen by a health care provider. This may be done with:  Irrigation with warm water. This is the most common method of removal.  Ear curettes and other instruments.  Surgery. This may be done in severe cases. HOME CARE INSTRUCTIONS  Take medicines only as directed by your health care provider.  Do not insert objects into the ear with the intent of cleaning the ear. PREVENTION  Do not insert objects into the ear, even with the intent of cleaning the ear. Removing cerumen as a part of normal hygiene is not necessary, and the use of swabs in the ear canal is not recommended.  Drink enough water to keep your urine clear or pale yellow.  Control your eczema if you have it. SEEK MEDICAL CARE IF:  You develop ear pain.  You develop bleeding from the ear.  The cerumen does not clear after you use ear drops as directed.   This information is not intended to replace advice given to you by your health care provider. Make sure you discuss any questions you have with your health care provider.   Document Released: 06/13/2004 Document Revised: 05/27/2014  Document Reviewed: 12/21/2014 Elsevier Interactive Patient Education 2016 Elsevier Inc.  

## 2016-02-04 NOTE — Progress Notes (Signed)
Cardiology Office Note   Date:  02/05/2016   ID:  [name], DOB Dec 11, 1927, MRN [id number]  PCP:  [name], MD  Cardiologist:   Dorris Carnes, MD   F/U of HTN and PPM     History of Present Illness: Brad [name] is a 82 y.o. male with a history of HTN, COPD, CAD< HL, AAA 9s/p repair in 2003), SSS and AV block (s/p PPM July 2017), PAF (new)  He  Was last seen by Emogene Morgan in August.  SInce pacer was placed he has not had any signif dizziness Moving slow  No CP  Breathing is fair R hand with pain and some swelling (hx of carpal tunnel surgery in past)      Outpatient Medications Prior to Visit  Medication Sig Dispense Refill  . acetaminophen (TYLENOL) 500 MG chewable tablet Chew 500 mg by mouth every 6 (six) hours as needed for pain.    Marland Kitchen albuterol (PROVENTIL HFA;VENTOLIN HFA) 108 (90 Base) MCG/ACT inhaler Inhale 2 puffs into the lungs every 6 (six) hours as needed for wheezing or shortness of breath. 1 Inhaler 2  . clobetasol (TEMOVATE) 0.05 % external solution APPLY 2-3 DROPS TO AFFECTED AREAS OF SCALP TWICE A DAY 50 mL 2  . diltiazem (CARDIZEM CD) 120 MG 24 hr capsule TAKE 1 CAPSULE (120 MG TOTAL) BY MOUTH DAILY. 30 capsule 0  . ELIQUIS 5 MG TABS tablet TAKE 1 TABLET (5 MG TOTAL) BY MOUTH 2 (TWO) TIMES DAILY. 60 tablet 0  . fish oil-omega-3 fatty acids 1000 MG capsule Take 1 g by mouth daily.      . fluocinolone (VANOS) 0.01 % cream Apply 1 application topically daily as needed (for skin).     . furosemide (LASIX) 20 MG tablet Take 1 tablet (20 mg total) by mouth daily. 30 tablet 11  . isosorbide mononitrate (IMDUR) 30 MG 24 hr tablet TAKE 1 TABLET BY MOUTH ONCE A DAY 30 tablet 0  . ketoconazole (NIZORAL) 2 % shampoo Use 2-3 times weekly. Leave on for 5 min. Rinse. 120 mL 0  . loratadine (CLARITIN) 10 MG tablet TAKE 1 TABLET (10 MG TOTAL) BY MOUTH DAILY. 30 tablet 1  . meloxicam (MOBIC) 7.5 MG tablet Take 1 tablet (7.5 mg total) by mouth daily. 30 tablet 0  .  metoprolol tartrate (LOPRESSOR) 25 MG tablet Take 1 tablet (25 mg total) by mouth 2 (two) times daily. 60 tablet 2  . Multiple Vitamins-Minerals (PRESERVISION AREDS PO) Take 1 tablet by mouth daily.    Marland Kitchen oxyCODONE-acetaminophen (PERCOCET/ROXICET) 5-325 MG tablet Take 1 tablet by mouth every 6 (six) hours as needed for severe pain. 30 tablet 0  . pantoprazole (PROTONIX) 20 MG tablet Take 20 mg by mouth daily as needed for heartburn or indigestion.     . potassium chloride SA (K-DUR,KLOR-CON) 20 MEQ tablet Take 1 tablet (20 mEq total) by mouth daily. 90 tablet 3  . rosuvastatin (CRESTOR) 10 MG tablet Take 5 mg by mouth daily.    . tamsulosin (FLOMAX) 0.4 MG CAPS capsule Take 2 capsules (0.8 mg total) by mouth at bedtime. 180 capsule 3  . triamcinolone cream (KENALOG) 0.1 % Apply 1 application topically 2 (two) times daily. Apply to AA of trunk, arms, legs. Avoid underarms, genitals, face, groin. (Patient taking differently: Apply 1 application topically 2 (two) times daily as needed (for skin). Apply to AA of trunk, arms, legs. Avoid underarms, genitals, face, groin.) 453.6 g 3  . TURMERIC CURCUMIN PO  Take 1 tablet by mouth daily.    . celecoxib (CELEBREX) 200 MG capsule TAKE 1 CAPSULE (200 MG TOTAL) BY MOUTH DAILY. WITH FOOD. FOR JOINT PAIN (Patient not taking: Reported on 02/05/2016) 30 capsule 0  . clobetasol (TEMOVATE) 0.05 % external solution Apply 1 application topically 2 (two) times daily. Apply 2-3 drops to AA of scalp twice a day     No facility-administered medications prior to visit.      Allergies:   Aspirin; Atorvastatin; and Cephalexin   Past Medical History:  Diagnosis Date  . Ascending aortic aneurysm (HCC)    Dr. Roxan Hockey  . CAD (coronary artery disease)    s/p stent to OM2 in past;  Last LHC 3/05: EF 60%, left RA occluded, right RA ok, prox to mid LAD 60%, oD1 75-80%, oD2 small 75%, pOM1 50%, OM2 stent ok with 50% before stent, mRCA 30%, dRCA 30%, PDA 70%.  Last dobutamine  myoview 5/06: no scar or ischemia, EF 60%.  . Chronic lower back pain   . COPD (chronic obstructive pulmonary disease) ([place])   . DJD (degenerative joint disease)   . Enlarged prostate   . Ethanolism ([place])   . GERD (gastroesophageal reflux disease)   . Hernia, abdominal    "he has ~ 4; wears binder" (11/20/2015)  . HLD (hyperlipidemia)   . HTN (hypertension)    Last echo 1/07:  Normal LVF, LV thickness upper limits of normal, mild aortic root dilatation, mild to mod MAC, mild LAE, borderline RVH, mild RAE.  . Macular degeneration   . PAD (peripheral artery disease) ([place])   . Paroxysmal atrial fibrillation (HCC)   . Psoriasis   . S/P AAA repair   . Sinus node dysfunction (HCC)    a. s/p MDT dual chamber PPM with His Bundle pacing  . Tobacco abuse   . Unilateral congenital absence of kidney     Past Surgical History:  Procedure Laterality Date  . APPENDECTOMY    . CARPAL TUNNEL RELEASE Right 12/26/2014   Procedure: RIGHT ENDOSCOPIC CARPAL TUNNEL SYNDROME RELEASE;  Surgeon: Milly Jakob, MD;  Location: [facility];  Service: Orthopedics;  Laterality: Right;  . CATARACT EXTRACTION, BILATERAL Bilateral   . CORONARY ANGIOPLASTY WITH STENT PLACEMENT     s/p stent to OM2 in past;   . EP IMPLANTABLE DEVICE N/A 11/20/2015   Procedure: Pacemaker Implant;  Surgeon: Evans Lance, MD;  Location: [facility];  Service: Cardiovascular;  Laterality: N/A;  . HERNIA REPAIR    . INSERT / REPLACE / REMOVE PACEMAKER  11/20/2015  . RESECTION AND GRAFTING OF INFARENAL  ABDOMINAL  AORTIC ANEURYSM  05/03/2002     Social History:  The patient  reports that he quit smoking about 19 years ago. His smoking use included Cigarettes. He has a 130.00 pack-year smoking history. He has never used smokeless tobacco. He reports that he drinks alcohol. He reports that he does not use drugs.   Family History:  The patient's family history includes COPD in his brother and sister; Cancer in his  brother, mother, and sister; Early death in his sister; Stroke in his mother.    ROS:  Please see the history of present illness. All other systems are reviewed and  Negative to the above problem except as noted.    PHYSICAL EXAM: VS:  BP (!) 152/88   Pulse 86   Ht 5\' 6"  (1.676 m)   Wt 193 lb 6.4 oz (87.7 kg)   SpO2  96%   BMI 31.22 kg/m   GEN: Well nourished, well developed, in no acute distress  HEENT: normal  Neck: no JVD, carotid bruits, or masses Cardiac: RRR; no murmurs, rubs, or gallops,no edema  Respiratory:  clear to auscultation bilaterally, normal work of breathing GI: soft, nontender, nondistended, + BS  No hepatomegaly  MS: no deformity Moving all extremities   Skin: warm and dry, no rash Neuro:  Strength and sensation are intact Psych: euthymic mood, full affect   EKG:  EKG is not  ordered today.   Lipid Panel    Component Value Date/Time   CHOL 146 07/08/2015 0857   CHOL 127 09/05/2011 1029   TRIG 49 07/08/2015 0857   TRIG 29 09/05/2011 1029   HDL 53 07/08/2015 0857   HDL 51 09/05/2011 1029   CHOLHDL 2.8 07/08/2015 0857   CHOLHDL 3 05/03/2013 1438   VLDL 19.8 05/03/2013 1438   LDLCALC 83 07/08/2015 0857   LDLCALC 70 09/05/2011 1029      Wt Readings from Last 3 Encounters:  02/05/16 193 lb 6.4 oz (87.7 kg)  02/02/16 194 lb (88 kg)  12/20/15 194 lb (88 kg)      ASSESSMENT AND PLAN:  1  Dizziness,  No further spells since pacer placed  2  PPM  Keep appt in October with Dr Lovena Le  I will see in spring  3  CAD  No symtpoms of angina  4  PAF  Keep on Eliquis  5.  Hand pain  I recomm he continue to use his exercise ball  Try a brace at night      Current medicines are reviewed at length with the patient today.  The patient does not have concerns regarding medicines.  Signed, Dorris Carnes, MD  02/05/2016 8:52 AM    [address], [place], [place]  [address] Phone: [phone]; Fax: [phone]

## 2016-02-05 ENCOUNTER — Telehealth: Payer: Self-pay | Admitting: Family Medicine

## 2016-02-05 ENCOUNTER — Ambulatory Visit (INDEPENDENT_AMBULATORY_CARE_PROVIDER_SITE_OTHER): Payer: Medicare Other | Admitting: Internal Medicine

## 2016-02-05 ENCOUNTER — Encounter: Payer: Self-pay | Admitting: Internal Medicine

## 2016-02-05 VITALS — BP 152/88 | HR 86 | Ht 66.0 in | Wt 193.4 lb

## 2016-02-05 DIAGNOSIS — I2510 Atherosclerotic heart disease of native coronary artery without angina pectoris: Secondary | ICD-10-CM

## 2016-02-05 DIAGNOSIS — I480 Paroxysmal atrial fibrillation: Secondary | ICD-10-CM | POA: Diagnosis not present

## 2016-02-05 DIAGNOSIS — I2584 Coronary atherosclerosis due to calcified coronary lesion: Secondary | ICD-10-CM | POA: Diagnosis not present

## 2016-02-05 DIAGNOSIS — I10 Essential (primary) hypertension: Secondary | ICD-10-CM

## 2016-02-05 LAB — BASIC METABOLIC PANEL
BUN: 14 mg/dL (ref 7–25)
CHLORIDE: 104 mmol/L (ref 98–110)
CO2: 27 mmol/L (ref 20–31)
Calcium: 9.1 mg/dL (ref 8.6–10.3)
Creat: 0.7 mg/dL (ref 0.70–1.11)
Glucose, Bld: 111 mg/dL — ABNORMAL HIGH (ref 65–99)
POTASSIUM: 4.3 mmol/L (ref 3.5–5.3)
Sodium: 140 mmol/L (ref 135–146)

## 2016-02-05 LAB — CBC
HCT: 41.7 % (ref 38.5–50.0)
Hemoglobin: 14 g/dL (ref 13.2–17.1)
MCH: 32.3 pg (ref 27.0–33.0)
MCHC: 33.6 g/dL (ref 32.0–36.0)
MCV: 96.3 fL (ref 80.0–100.0)
MPV: 10.1 fL (ref 7.5–12.5)
PLATELETS: 168 10*3/uL (ref 140–400)
RBC: 4.33 MIL/uL (ref 4.20–5.80)
RDW: 13.7 % (ref 11.0–15.0)
WBC: 6.7 10*3/uL (ref 3.8–10.8)

## 2016-02-05 NOTE — Patient Instructions (Addendum)
Your physician recommends that you continue on your current medications as directed. Please refer to the Current Medication list given to you today. Your physician recommends that you return for lab work TODAY (BMET, CBC) Your physician wants you to follow-up in: January, 2018 with Dr. Harrington Challenger.  You will receive a reminder letter in the mail two months in advance. If you don't receive a letter, please call our office to schedule the follow-up appointment.

## 2016-02-05 NOTE — Telephone Encounter (Signed)
Pt having swelling R arm and legs appt scheduled

## 2016-02-06 ENCOUNTER — Ambulatory Visit (INDEPENDENT_AMBULATORY_CARE_PROVIDER_SITE_OTHER): Payer: Medicare Other | Admitting: Family Medicine

## 2016-02-06 ENCOUNTER — Ambulatory Visit (INDEPENDENT_AMBULATORY_CARE_PROVIDER_SITE_OTHER): Payer: Medicare Other

## 2016-02-06 ENCOUNTER — Encounter: Payer: Self-pay | Admitting: Family Medicine

## 2016-02-06 VITALS — BP 124/72 | HR 71 | Temp 97.1°F | Ht 66.0 in | Wt 196.2 lb

## 2016-02-06 DIAGNOSIS — K219 Gastro-esophageal reflux disease without esophagitis: Secondary | ICD-10-CM

## 2016-02-06 DIAGNOSIS — G8929 Other chronic pain: Secondary | ICD-10-CM

## 2016-02-06 DIAGNOSIS — I2584 Coronary atherosclerosis due to calcified coronary lesion: Secondary | ICD-10-CM | POA: Diagnosis not present

## 2016-02-06 DIAGNOSIS — R600 Localized edema: Secondary | ICD-10-CM

## 2016-02-06 DIAGNOSIS — M533 Sacrococcygeal disorders, not elsewhere classified: Secondary | ICD-10-CM | POA: Diagnosis not present

## 2016-02-06 DIAGNOSIS — R0600 Dyspnea, unspecified: Secondary | ICD-10-CM | POA: Diagnosis not present

## 2016-02-06 DIAGNOSIS — M7989 Other specified soft tissue disorders: Secondary | ICD-10-CM | POA: Diagnosis not present

## 2016-02-06 DIAGNOSIS — I2510 Atherosclerotic heart disease of native coronary artery without angina pectoris: Secondary | ICD-10-CM | POA: Diagnosis not present

## 2016-02-06 NOTE — Progress Notes (Signed)
Subjective:  Patient ID: Brad [name], male    DOB: [birth year]  Age: 82 y.o. MRN: [id number]  CC: Leg Swelling (pt here today c/o lower extremity swelling and right hand.)   HPI Brad [name] presents for Ongoing swelling in the legs and ankles. This is bilateral. It is not related to any recent chest pain but he does have some dyspnea on exertion. They noted that he did a good bit of walking around a couple of days ago and he had some dyspnea on exertion with that. It's unclear just how much walking but apparently it was in the neighborhood of a mile. However, this was spread out over an extended time of couple of hours or more. Additionally the swelling has not been painful. He did have an episode yesterday of some nausea and broke at an sweat and felt weak for several minutes. This resolved spontaneously. He has 3 or 4 ventral hernias. His heart doctor told him that he could not have them operated on. He thinks these might have been the source of the problem. Additionally he has low back pain. This been present for about a year. Several weeks ago he had a fall. He was trout fishing and fell in the river. He struggled to get out for about an hour. Recently he had a pacemaker inserted. This was related to sinus node dysfunction. Another problem he brings up today is swelling that has started in the last several days of the right hand. He is wearing a brace that was given to him by his heart doctor yesterday. It seems to help with the pain relief that he needs. He also takes oxycodone 3 times a week and Celebrex daily. History Brad [name] has a past medical history of Ascending aortic aneurysm ([place]); CAD (coronary artery disease); Chronic lower back pain; COPD (chronic obstructive pulmonary disease) (HCC); DJD (degenerative joint disease); Enlarged prostate; Ethanolism ([place]); GERD (gastroesophageal reflux disease); Hernia, abdominal; HLD (hyperlipidemia); HTN (hypertension); Macular degeneration; PAD  (peripheral artery disease) ([place]); Paroxysmal atrial fibrillation ([place]); Psoriasis; S/P AAA repair; Sinus node dysfunction ([place]); Tobacco abuse; and Unilateral congenital absence of kidney.   He has a past surgical history that includes Appendectomy; RESECTION AND GRAFTING OF INFARENAL  ABDOMINAL  AORTIC ANEURYSM (05/03/2002); Carpal tunnel release (Right, 12/26/2014); Insert / replace / remove pacemaker (11/20/2015); Hernia repair; Cataract extraction, bilateral (Bilateral); Coronary angioplasty with stent; and Cardiac catheterization (N/A, 11/20/2015).   His family history includes COPD in his brother and sister; Cancer in his brother, mother, and sister; Early death in his sister; Stroke in his mother.He reports that he quit smoking about 19 years ago. His smoking use included Cigarettes. He has a 130.00 pack-year smoking history. He has never used smokeless tobacco. He reports that he drinks alcohol. He reports that he does not use drugs.    ROS Review of Systems  Constitutional: Negative for chills, diaphoresis, fever and unexpected weight change.  HENT: Negative for congestion, hearing loss, rhinorrhea and sore throat.   Eyes: Negative for visual disturbance.  Respiratory: Negative for cough and shortness of breath.   Cardiovascular: Positive for leg swelling. Negative for chest pain.  Gastrointestinal: Negative for abdominal pain, constipation and diarrhea.  Genitourinary: Negative for dysuria and flank pain.  Musculoskeletal: Positive for arthralgias, joint swelling and myalgias.  Skin: Negative for rash.  Neurological: Negative for dizziness and headaches.  Psychiatric/Behavioral: Negative for dysphoric mood and sleep disturbance.    Objective:  BP 124/72   Pulse 71  Temp 97.1 F (36.2 C) (Oral)   Ht '5\' 6"'  (1.676 m)   Wt 196 lb 4 oz (89 kg)   BMI 31.68 kg/m   BP Readings from Last 3 Encounters:  02/06/16 124/72  02/05/16 (!) 152/88  02/02/16 117/75    Wt Readings from Last  3 Encounters:  02/06/16 196 lb 4 oz (89 kg)  02/05/16 193 lb 6.4 oz (87.7 kg)  02/02/16 194 lb (88 kg)     Physical Exam  Constitutional: He appears well-developed and well-nourished.  HENT:  Head: Normocephalic and atraumatic.  Right Ear: Tympanic membrane and external ear normal. No decreased hearing is noted.  Left Ear: Tympanic membrane and external ear normal. No decreased hearing is noted.  Mouth/Throat: No oropharyngeal exudate or posterior oropharyngeal erythema.  Eyes: Pupils are equal, round, and reactive to light.  Neck: Normal range of motion. Neck supple.  Cardiovascular: Normal rate and regular rhythm.   No murmur heard. Pulmonary/Chest: Breath sounds normal. No respiratory distress.  Abdominal: Soft. Bowel sounds are normal. He exhibits no mass. There is no tenderness.  Vitals reviewed.    Lab Results  Component Value Date   WBC 6.7 02/05/2016   HGB 14.0 02/05/2016   HCT 41.7 02/05/2016   PLT 168 02/05/2016   GLUCOSE 111 (H) 02/05/2016   CHOL 146 07/08/2015   TRIG 49 07/08/2015   HDL 53 07/08/2015   LDLCALC 83 07/08/2015   ALT 12 07/08/2015   AST 15 07/08/2015   NA 140 02/05/2016   K 4.3 02/05/2016   CL 104 02/05/2016   CREATININE 0.70 02/05/2016   BUN 14 02/05/2016   CO2 27 02/05/2016   TSH 0.49 05/02/2014    Dg Chest 2 View  Result Date: 11/21/2015 CLINICAL DATA:  Pacemaker placement. EXAM: CHEST  2 VIEW COMPARISON:  None. FINDINGS: Pacemaker has been placed from LEFT subclavian approach. Atrial and ventricular leads appear satisfactory. There is no pneumothorax. The heart is enlarged. Prominent ascending aorta, aneurysmally dilated with calcification. No effusion or pneumothorax. No pulmonary infiltrate or edema. Osteopenia. IMPRESSION: Satisfactory appearance status post pacemaker placement. Cardiomegaly. Electronically Signed   By: Staci Righter M.D.   On: 11/21/2015 08:02    Assessment & Plan:   Brad [name] was seen today for leg  swelling.  Diagnoses and all orders for this visit:  Swelling of right hand -     CBC with Differential/Platelet -     CMP14+EGFR -     DG Hand Complete Right; Future  Chronic sacroiliac pain -     CBC with Differential/Platelet -     CMP14+EGFR -     DG Lumbar Spine 2-3 Views; Future  Edema of both legs -     CBC with Differential/Platelet -     CMP14+EGFR -     Brain natriuretic peptide -     DG Chest 2 View; Future  Gastroesophageal reflux disease without esophagitis -     CBC with Differential/Platelet -     CMP14+EGFR  Dyspnea -     Brain natriuretic peptide -     DG Chest 2 View; Future      I am having Brad [name] maintain his fish oil-omega-3 fatty acids, TURMERIC CURCUMIN PO, Multiple Vitamins-Minerals (PRESERVISION AREDS PO), triamcinolone cream, fluocinolone, furosemide, ketoconazole, potassium chloride SA, tamsulosin, oxyCODONE-acetaminophen, albuterol, pantoprazole, rosuvastatin, metoprolol tartrate, acetaminophen, clobetasol, diltiazem, isosorbide mononitrate, ELIQUIS, loratadine, and meloxicam.  No orders of the defined types were placed in this encounter.    Follow-up: Return in  about 1 month (around 03/07/2016).  [name], M.D.

## 2016-02-10 ENCOUNTER — Other Ambulatory Visit: Payer: Self-pay | Admitting: Family Medicine

## 2016-02-17 ENCOUNTER — Other Ambulatory Visit: Payer: Self-pay | Admitting: Family Medicine

## 2016-02-19 ENCOUNTER — Encounter: Payer: Self-pay | Admitting: Pharmacist

## 2016-02-19 ENCOUNTER — Ambulatory Visit (INDEPENDENT_AMBULATORY_CARE_PROVIDER_SITE_OTHER): Payer: Medicare Other | Admitting: Pharmacist

## 2016-02-19 VITALS — BP 114/66 | HR 70 | Ht 66.0 in | Wt 195.0 lb

## 2016-02-19 DIAGNOSIS — Z0000 Encounter for general adult medical examination without abnormal findings: Secondary | ICD-10-CM

## 2016-02-19 DIAGNOSIS — Z23 Encounter for immunization: Secondary | ICD-10-CM | POA: Diagnosis not present

## 2016-02-19 NOTE — Progress Notes (Signed)
Patient ID: Brad [name], male   DOB: 05-Feb-1928, 82 y.o.   MRN: [id number]     Subjective:   Brad [name] is a 82 y.o. male who presents for a Subsequent Medicare Annual Wellness Visit.    He is accompanied by his son Brad [name].  Mr.  Brad [name] lives alone in [place], [place] and his other son lives next door.  [name] has been widowed for 2 years.     His has 2 health concerns:  (1) back and hip pain and (2) leg weakness / afraid of falling  Current Medications (verified) Outpatient Encounter Prescriptions as of 02/19/2016  Medication Sig  . acetaminophen (TYLENOL) 500 MG chewable tablet Chew 500 mg by mouth every 6 (six) hours as needed for pain.  . celecoxib (CELEBREX) 200 MG capsule TAKE 1 CAPSULE (200 MG TOTAL) BY MOUTH DAILY. WITH FOOD. FOR JOINT PAIN  . clobetasol (TEMOVATE) 0.05 % external solution APPLY 2-3 DROPS TO AFFECTED AREAS OF SCALP TWICE A DAY  . diltiazem (CARDIZEM CD) 120 MG 24 hr capsule TAKE 1 CAPSULE (120 MG TOTAL) BY MOUTH DAILY.  Marland Kitchen ELIQUIS 5 MG TABS tablet TAKE 1 TABLET TWICE A DAY  . fish oil-omega-3 fatty acids 1000 MG capsule Take 1 g by mouth daily.    . fluocinolone (VANOS) 0.01 % cream Apply 1 application topically daily as needed (for skin).   . furosemide (LASIX) 20 MG tablet Take 1 tablet (20 mg total) by mouth daily. (Patient taking differently: Take 40 mg by mouth daily. )  . isosorbide mononitrate (IMDUR) 30 MG 24 hr tablet TAKE 1 TABLET BY MOUTH ONCE A DAY  . ketoconazole (NIZORAL) 2 % shampoo Use 2-3 times weekly. Leave on for 5 min. Rinse.  . loratadine (CLARITIN) 10 MG tablet TAKE 1 TABLET (10 MG TOTAL) BY MOUTH DAILY.  . Melatonin 5 MG TABS Take 5 mg by mouth at bedtime.  . metoprolol tartrate (LOPRESSOR) 25 MG tablet Take 1 tablet (25 mg total) by mouth 2 (two) times daily.  . Multiple Vitamins-Minerals (PRESERVISION AREDS PO) Take 1 tablet by mouth daily.  . pantoprazole (PROTONIX) 20 MG tablet Take 20 mg by mouth daily as needed for heartburn or  indigestion.   . potassium chloride SA (K-DUR,KLOR-CON) 20 MEQ tablet Take 1 tablet (20 mEq total) by mouth daily.  . rosuvastatin (CRESTOR) 10 MG tablet Take 5 mg by mouth daily.  . tamsulosin (FLOMAX) 0.4 MG CAPS capsule Take 2 capsules (0.8 mg total) by mouth at bedtime.  . triamcinolone cream (KENALOG) 0.1 % Apply 1 application topically 2 (two) times daily. Apply to AA of trunk, arms, legs. Avoid underarms, genitals, face, groin. (Patient taking differently: Apply 1 application topically 2 (two) times daily as needed (for skin). Apply to AA of trunk, arms, legs. Avoid underarms, genitals, face, groin.)  . TURMERIC CURCUMIN PO Take 1 tablet by mouth daily.  Marland Kitchen albuterol (PROVENTIL HFA;VENTOLIN HFA) 108 (90 Base) MCG/ACT inhaler Inhale 2 puffs into the lungs every 6 (six) hours as needed for wheezing or shortness of breath. (Patient not taking: Reported on 02/19/2016)  . [DISCONTINUED] meloxicam (MOBIC) 7.5 MG tablet Take 1 tablet (7.5 mg total) by mouth daily. (Patient not taking: Reported on 02/19/2016)  . [DISCONTINUED] oxyCODONE-acetaminophen (PERCOCET/ROXICET) 5-325 MG tablet Take 1 tablet by mouth every 6 (six) hours as needed for severe pain. (Patient not taking: Reported on 02/19/2016)   No facility-administered encounter medications on file as of 02/19/2016.     Allergies (verified)  Aspirin; Atorvastatin; and Cephalexin   History: Past Medical History:  Diagnosis Date  . Ascending aortic aneurysm (HCC)    Dr. Roxan Hockey  . CAD (coronary artery disease)    s/p stent to OM2 in past;  Last LHC 3/05: EF 60%, left RA occluded, right RA ok, prox to mid LAD 60%, oD1 75-80%, oD2 small 75%, pOM1 50%, OM2 stent ok with 50% before stent, mRCA 30%, dRCA 30%, PDA 70%.  Last dobutamine myoview 5/06: no scar or ischemia, EF 60%.  . Chronic lower back pain   . COPD (chronic obstructive pulmonary disease) ([place])   . DJD (degenerative joint disease)   . Enlarged prostate   . Ethanolism ([place])   .  GERD (gastroesophageal reflux disease)   . Hernia, abdominal    "he has ~ 4; wears binder" (11/20/2015)  . HLD (hyperlipidemia)   . HTN (hypertension)    Last echo 1/07:  Normal LVF, LV thickness upper limits of normal, mild aortic root dilatation, mild to mod MAC, mild LAE, borderline RVH, mild RAE.  . Macular degeneration   . PAD (peripheral artery disease) ([place])   . Paroxysmal atrial fibrillation (HCC)   . Psoriasis   . S/P AAA repair   . Sinus node dysfunction (HCC)    a. s/p MDT dual chamber PPM with His Bundle pacing  . Tobacco abuse   . Unilateral congenital absence of kidney    Past Surgical History:  Procedure Laterality Date  . APPENDECTOMY    . CARPAL TUNNEL RELEASE Right 12/26/2014   Procedure: RIGHT ENDOSCOPIC CARPAL TUNNEL SYNDROME RELEASE;  Surgeon: Milly Jakob, MD;  Location: [facility];  Service: Orthopedics;  Laterality: Right;  . CATARACT EXTRACTION, BILATERAL Bilateral   . CORONARY ANGIOPLASTY WITH STENT PLACEMENT     s/p stent to OM2 in past;   . EP IMPLANTABLE DEVICE N/A 11/20/2015   Procedure: Pacemaker Implant;  Surgeon: Evans Lance, MD;  Location: [facility];  Service: Cardiovascular;  Laterality: N/A;  . HERNIA REPAIR    . INSERT / REPLACE / REMOVE PACEMAKER  11/20/2015  . RESECTION AND GRAFTING OF INFARENAL  ABDOMINAL  AORTIC ANEURYSM  05/03/2002   Family History  Problem Relation Age of Onset  . Cancer Mother     uterine  . Stroke Mother     hemi plagic  . Cancer Sister   . COPD Sister   . COPD Brother   . Cancer Brother     Lip  . Early death Sister    Social History   Occupational History  . Not on file.   Social History Main Topics  . Smoking status: Former Smoker    Packs/day: 2.50    Years: 52.00    Types: Cigarettes    Quit date: 05/06/1996  . Smokeless tobacco: Never Used  . Alcohol use Yes     Comment: 11/20/2015 "used to drink heavily; quit 05/06/1996"  . Drug use: No  . Sexual activity: No    Do you  feel safe at home?  Yes Are there smokers in your home (other than you)? No  Dietary issues and exercise activities discussed: Current Exercise Habits: The patient does not participate in regular exercise at present, Exercise limited by: orthopedic condition(s)  Current Dietary habits:  Not following specific dietary recommendations    Cardiac Risk Factors include: advanced age (>32men, >62 women);male gender;obesity (BMI >30kg/m2);dyslipidemia  Objective:    Today's Vitals   02/19/16 1251 02/19/16 1253  BP: 120/68  114/66  Pulse: 70   Weight: 195 lb (88.5 kg)   Height: 5\' 6"  (1.676 m)   PainSc:  2   PainLoc:  Abdomen   Body mass index is 31.47 kg/m.   Activities of Daily Living In your present state of health, do you have any difficulty performing the following activities: 02/19/2016 11/20/2015  Hearing? [place]? Y -  Difficulty concentrating or making decisions? N -  Walking or climbing stairs? Y -  Dressing or bathing? N -  Doing errands, shopping? N N  Preparing Food and eating ? N -  Using the Toilet? N -  In the past six months, have you accidently leaked urine? N -  Do you have problems with loss of bowel control? N -  Managing your Medications? N -  Managing your Finances? N -  Housekeeping or managing your Housekeeping? N -  Some recent data might be hidden     Depression Screen PHQ 2/9 Scores 02/19/2016 02/06/2016 02/02/2016 02/17/2015  PHQ - 2 Score 0 0 0 0     Fall Risk Fall Risk  02/19/2016 02/06/2016 02/02/2016 02/17/2015 09/28/2014  Falls in the past year? Yes No No Yes Yes  Number falls in past yr: 2 or more - - 1 2 or more  Injury with Fall? Yes - - No No  Risk Factor Category  High Fall Risk - - - -  Risk for fall due to : History of fall(s);Medication side effect;Impaired balance/gait - - - Impaired balance/gait  Follow up Falls evaluation completed;Falls prevention discussed - - Falls evaluation completed;Falls prevention discussed -   Fall  Assessment: patient has several risk factors for falling including:  Fear of falling, history of recent falls and more than 2 within the last 12 months, vision impairment, polypharmacy, pain that affects his functional ability and nocturia.   Cognitive Function: MMSE - Mini Mental State Exam 02/19/2016 02/17/2015  Orientation to time 3 5  Orientation to Place 5 5  Registration 3 3  Attention/ Calculation 2 5  Recall 0 2  Language- name 2 objects 1 2  Language- repeat 1 1  Language- follow 3 step command 1 3  Language- read & follow direction 1 1  Write a sentence 1 1  Copy design 1 0  Total score 19 28    Immunizations and Health Maintenance Immunization History  Administered Date(s) Administered  . Influenza, High Dose Seasonal PF 01/24/2014, 01/16/2016  . Influenza,inj,Quad PF,36+ Mos 03/04/2013, 02/17/2015  . Pneumococcal Conjugate-13 02/17/2015  . Pneumococcal Polysaccharide-23 02/19/2016   Health Maintenance Due  Topic Date Due  . TETANUS/TDAP  06/26/1948  . ZOSTAVAX  06/26/1989  . PNA vac Low Risk Adult (2 of 2 - PPSV23) 02/17/2016    Patient Care Team: [name], MD as PCP - General (Family Medicine) Fay Records, MD (Cardiology) Autumn Messing III, MD as Consulting Physician (General Surgery)  Indicate any recent Medical Services you may have received from other than [facility] providers in the past year (date may be approximate).    Assessment:    Annual Wellness Visit  Fall Assessment: patient has several risk factors for falling including:  Fear of falling, history of recent falls and more than 2 within the last 12 months, vision impairment, polypharmacy,  pain that affects his functional ability and nocturia.   Obesity     Screening Tests Health Maintenance  Topic Date Due  . TETANUS/TDAP  06/26/1948  . ZOSTAVAX  06/26/1989  . PNA  vac Low Risk Adult (2 of 2 - PPSV23) 02/17/2016  . INFLUENZA VACCINE  Completed        Plan:   During the course of the  visit Terrio was educated and counseled about the following appropriate screening and preventive services:   Vaccines to include Pneumoccal, Influenza,  Td, Zostavax - pneumonvac 23 given in office today.   Fall risk assessment - recommended consider alert system incase patient fall again to alert family and 911.  Referral back to PCP for back / hip pain,  Recommended PT /OT but patient refused - for balance assessment and treatment  I did have talk with patient and son about weighting risk vs benefits of blood thinner should patient continue to have falls.   Colorectal cancer screening - FOBT is UTD  Cardiovascular disease screening - UTD.  Pacemaker placed about 6 months ago.  Diabetes screening - last FBG was elevated - recommend A1c with next labs  Glaucoma screening / Diabetic Eye Exam - UTD see Dr Adella Nissen in [place] , [place]  Nutrition counseling - limit high sugar and CHO containing foods.  Advanced Directives - requested family locate copy and bring office for records  Physical Activity - continue to walk as much as able.  Follow up wit PCP regarding hip and back pain.    Patient Instructions (the written plan) were given to the patient.   Cherre Robins, PharmD   02/19/2016

## 2016-02-19 NOTE — Patient Instructions (Signed)
  Mr. Bussie , Thank you for taking time to come for your Medicare Wellness Visit. I appreciate your ongoing commitment to your health goals. Please review the following plan we discussed and let me know if I can assist you in the future.   These are the goals we discussed:  Continue to stay active - walking as much as you can.   Continue to limit sugar and salt in foods.   Consider Freedom Alert   Consider seeing physical therapy to do balance assessment - this could help decrease falls and increase strength in legs.     This is a list of the screening recommended for you and due dates:  Health Maintenance  Topic Date Due  . Tetanus Vaccine  06/26/1948  . Shingles Vaccine  06/26/1989  . Pneumonia vaccines (2 of 2 - PPSV23) cpmpleted  . Flu Shot  Completed

## 2016-02-22 ENCOUNTER — Encounter: Payer: Self-pay | Admitting: Family Medicine

## 2016-02-22 ENCOUNTER — Ambulatory Visit (INDEPENDENT_AMBULATORY_CARE_PROVIDER_SITE_OTHER): Payer: Medicare Other | Admitting: Family Medicine

## 2016-02-22 VITALS — BP 188/73 | HR 56 | Temp 97.9°F | Ht 66.0 in | Wt 195.0 lb

## 2016-02-22 DIAGNOSIS — M7989 Other specified soft tissue disorders: Secondary | ICD-10-CM

## 2016-02-22 DIAGNOSIS — I10 Essential (primary) hypertension: Secondary | ICD-10-CM | POA: Diagnosis not present

## 2016-02-22 DIAGNOSIS — I2584 Coronary atherosclerosis due to calcified coronary lesion: Secondary | ICD-10-CM

## 2016-02-22 DIAGNOSIS — M5441 Lumbago with sciatica, right side: Secondary | ICD-10-CM | POA: Diagnosis not present

## 2016-02-22 DIAGNOSIS — I2510 Atherosclerotic heart disease of native coronary artery without angina pectoris: Secondary | ICD-10-CM | POA: Diagnosis not present

## 2016-02-22 DIAGNOSIS — G8929 Other chronic pain: Secondary | ICD-10-CM | POA: Diagnosis not present

## 2016-02-22 DIAGNOSIS — W19XXXA Unspecified fall, initial encounter: Secondary | ICD-10-CM | POA: Diagnosis not present

## 2016-02-22 DIAGNOSIS — Y92099 Unspecified place in other non-institutional residence as the place of occurrence of the external cause: Secondary | ICD-10-CM

## 2016-02-22 DIAGNOSIS — Y92009 Unspecified place in unspecified non-institutional (private) residence as the place of occurrence of the external cause: Secondary | ICD-10-CM

## 2016-02-22 NOTE — Progress Notes (Signed)
Subjective:  Patient ID: KYLOR HORIGAN, male    DOB: 04/16/1928  Age: 82 y.o. MRN: [id number]  CC: Hand Pain (pt here today following up on his right wrist and and the brace is helping) and Back Pain (also here today following up on his back which he says is doing better)   HPI [name] presents for improvement of low back pain. Wearing splint intermittently for wrist. It seems to help with the pain . This has decreased a lot. Adding tylenol 2 BID and 1 qhs has helped with pain. He also takes oxycodone 3 times a week and Celebrex daily.This been present for about a year. Several weeks ago he had a fall. He had another a few days ago. Denies injury except scraped his elbow History Rush has a past medical history of Ascending aortic aneurysm ([place]); CAD (coronary artery disease); Chronic lower back pain; COPD (chronic obstructive pulmonary disease) (HCC); DJD (degenerative joint disease); Enlarged prostate; Ethanolism ([place]); GERD (gastroesophageal reflux disease); Hernia, abdominal; HLD (hyperlipidemia); HTN (hypertension); Macular degeneration; PAD (peripheral artery disease) ([place]); Paroxysmal atrial fibrillation ([place]); Psoriasis; S/P AAA repair; Sinus node dysfunction ([place]); Tobacco abuse; and Unilateral congenital absence of kidney.   He has a past surgical history that includes Appendectomy; RESECTION AND GRAFTING OF INFARENAL  ABDOMINAL  AORTIC ANEURYSM (05/03/2002); Carpal tunnel release (Right, 12/26/2014); Insert / replace / remove pacemaker (11/20/2015); Hernia repair; Cataract extraction, bilateral (Bilateral); Coronary angioplasty with stent; and Cardiac catheterization (N/A, 11/20/2015).   His family history includes COPD in his brother and sister; Cancer in his brother, mother, and sister; Early death in his sister; Stroke in his mother.He reports that he quit smoking about 19 years ago. His smoking use included Cigarettes. He has a 130.00 pack-year smoking history. He has never used  smokeless tobacco. He reports that he drinks alcohol. He reports that he does not use drugs.    ROS Review of Systems  Constitutional: Negative for chills, diaphoresis, fever and unexpected weight change.  HENT: Negative for congestion, hearing loss, rhinorrhea and sore throat.   Eyes: Negative for visual disturbance.  Respiratory: Negative for cough and shortness of breath.   Cardiovascular: Positive for leg swelling. Negative for chest pain.  Gastrointestinal: Negative for abdominal pain, constipation and diarrhea.  Genitourinary: Negative for dysuria and flank pain.  Musculoskeletal: Positive for arthralgias, joint swelling and myalgias.  Skin: Negative for rash.  Neurological: Negative for dizziness and headaches.  Psychiatric/Behavioral: Negative for dysphoric mood and sleep disturbance.    Objective:  BP (!) 188/73   Pulse (!) 56   Temp 97.9 F (36.6 C) (Oral)   Ht 5\' 6"  (1.676 m)   Wt 195 lb (88.5 kg)   BMI 31.47 kg/m   BP Readings from Last 3 Encounters:  02/22/16 (!) 188/73  02/19/16 114/66  02/06/16 124/72    Wt Readings from Last 3 Encounters:  02/22/16 195 lb (88.5 kg)  02/19/16 195 lb (88.5 kg)  02/06/16 196 lb 4 oz (89 kg)     Physical Exam  Constitutional: He is oriented to person, place, and time. He appears well-developed and well-nourished.  HENT:  Head: Normocephalic and atraumatic.  Right Ear: Tympanic membrane and external ear normal. No decreased hearing is noted.  Left Ear: Tympanic membrane and external ear normal. No decreased hearing is noted.  Mouth/Throat: No oropharyngeal exudate or posterior oropharyngeal erythema.  Eyes: Pupils are equal, round, and reactive to light.  Neck: Normal range of motion. Neck supple.  Cardiovascular:  Normal rate and regular rhythm.   No murmur heard. Pulmonary/Chest: Breath sounds normal. No respiratory distress.  Musculoskeletal: Normal range of motion. He exhibits no edema or tenderness.  Neurological:  He is alert and oriented to person, place, and time.  Skin: Skin is warm and dry.  2 cm thick crust over left olecranon abrasion  Psychiatric: He has a normal mood and affect.  Vitals reviewed.    Lab Results  Component Value Date   WBC 6.7 02/05/2016   HGB 14.0 02/05/2016   HCT 41.7 02/05/2016   PLT 168 02/05/2016   GLUCOSE 111 (H) 02/05/2016   CHOL 146 07/08/2015   TRIG 49 07/08/2015   HDL 53 07/08/2015   LDLCALC 83 07/08/2015   ALT 12 07/08/2015   AST 15 07/08/2015   NA 140 02/05/2016   K 4.3 02/05/2016   CL 104 02/05/2016   CREATININE 0.70 02/05/2016   BUN 14 02/05/2016   CO2 27 02/05/2016   TSH 0.49 05/02/2014    Dg Chest 2 View  Result Date: 11/21/2015 CLINICAL DATA:  Pacemaker placement. EXAM: CHEST  2 VIEW COMPARISON:  None. FINDINGS: Pacemaker has been placed from LEFT subclavian approach. Atrial and ventricular leads appear satisfactory. There is no pneumothorax. The heart is enlarged. Prominent ascending aorta, aneurysmally dilated with calcification. No effusion or pneumothorax. No pulmonary infiltrate or edema. Osteopenia. IMPRESSION: Satisfactory appearance status post pacemaker placement. Cardiomegaly. Electronically Signed   By: Staci Righter M.D.   On: 11/21/2015 08:02    Assessment & Plan:   Ethelbert was seen today for hand pain and back pain.  Diagnoses and all orders for this visit:  Swelling of right hand  Chronic right-sided low back pain with right-sided sciatica  Essential hypertension  Fall at home, initial encounter -     Ambulatory referral to Physical Therapy      I am having Mr. Barrueta maintain his fish oil-omega-3 fatty acids, TURMERIC CURCUMIN PO, Multiple Vitamins-Minerals (PRESERVISION AREDS PO), triamcinolone cream, fluocinolone, furosemide, ketoconazole, potassium chloride SA, tamsulosin, albuterol, pantoprazole, rosuvastatin, metoprolol tartrate, acetaminophen, clobetasol, isosorbide mononitrate, loratadine, diltiazem, celecoxib,  ELIQUIS, and Melatonin.  No orders of the defined types were placed in this encounter.    Follow-up: Return in about 3 months (around 05/24/2016).  [name], M.D.

## 2016-02-24 ENCOUNTER — Other Ambulatory Visit: Payer: Self-pay | Admitting: Family Medicine

## 2016-02-26 ENCOUNTER — Encounter: Payer: Self-pay | Admitting: Internal Medicine

## 2016-03-01 ENCOUNTER — Other Ambulatory Visit: Payer: Self-pay | Admitting: *Deleted

## 2016-03-01 DIAGNOSIS — I10 Essential (primary) hypertension: Secondary | ICD-10-CM

## 2016-03-01 MED ORDER — FUROSEMIDE 20 MG PO TABS: 40.0000 mg | ORAL_TABLET | Freq: Every day | ORAL | 11 refills | Status: DC

## 2016-03-04 ENCOUNTER — Ambulatory Visit: Payer: Medicare Other | Attending: Family Medicine | Admitting: Physical Therapy

## 2016-03-04 DIAGNOSIS — R2689 Other abnormalities of gait and mobility: Secondary | ICD-10-CM | POA: Diagnosis not present

## 2016-03-04 DIAGNOSIS — M6281 Muscle weakness (generalized): Secondary | ICD-10-CM | POA: Diagnosis not present

## 2016-03-04 NOTE — Therapy (Signed)
[facility] [place], [place], [address] Phone: [phone]   Fax:  [phone]  Physical Therapy Evaluation  Patient Details  Name: Brad [name] MRN: [id number] Date of Birth: 1928-03-26 Referring Provider: [name] MD  Encounter Date: 03/04/2016      PT End of Session - 03/04/16 1219    Visit Number 1   Number of Visits 12   Date for PT Re-Evaluation 05/03/16   PT Start Time 1036   Activity Tolerance Patient tolerated treatment well   Behavior During Therapy [facility] for tasks assessed/performed      Past Medical History:  Diagnosis Date  . Ascending aortic aneurysm (HCC)    Dr. Roxan Hockey  . CAD (coronary artery disease)    s/p stent to OM2 in past;  Last LHC 3/05: EF 60%, left RA occluded, right RA ok, prox to mid LAD 60%, oD1 75-80%, oD2 small 75%, pOM1 50%, OM2 stent ok with 50% before stent, mRCA 30%, dRCA 30%, PDA 70%.  Last dobutamine myoview 5/06: no scar or ischemia, EF 60%.  . Chronic lower back pain   . COPD (chronic obstructive pulmonary disease) ([place])   . DJD (degenerative joint disease)   . Enlarged prostate   . Ethanolism ([place])   . GERD (gastroesophageal reflux disease)   . Hernia, abdominal    "he has ~ 4; wears binder" (11/20/2015)  . HLD (hyperlipidemia)   . HTN (hypertension)    Last echo 1/07:  Normal LVF, LV thickness upper limits of normal, mild aortic root dilatation, mild to mod MAC, mild LAE, borderline RVH, mild RAE.  . Macular degeneration   . PAD (peripheral artery disease) ([place])   . Paroxysmal atrial fibrillation (HCC)   . Psoriasis   . S/P AAA repair   . Sinus node dysfunction (HCC)    a. s/p MDT dual chamber PPM with His Bundle pacing  . Tobacco abuse   . Unilateral congenital absence of kidney     Past Surgical History:  Procedure Laterality Date  . APPENDECTOMY    . CARPAL TUNNEL RELEASE Right 12/26/2014   Procedure: RIGHT ENDOSCOPIC CARPAL TUNNEL SYNDROME RELEASE;   Surgeon: Milly Jakob, MD;  Location: [place];  Service: Orthopedics;  Laterality: Right;  . CATARACT EXTRACTION, BILATERAL Bilateral   . CORONARY ANGIOPLASTY WITH STENT PLACEMENT     s/p stent to OM2 in past;   . EP IMPLANTABLE DEVICE N/A 11/20/2015   Procedure: Pacemaker Implant;  Surgeon: Evans Lance, MD;  Location: [place] CV LAB;  Service: Cardiovascular;  Laterality: N/A;  . HERNIA REPAIR    . INSERT / REPLACE / REMOVE PACEMAKER  11/20/2015  . RESECTION AND GRAFTING OF INFARENAL  ABDOMINAL  AORTIC ANEURYSM  05/03/2002    There were no vitals filed for this visit.       Subjective Assessment - 03/04/16 1146    Subjective The patient states he was trout fishing earlier this year and slipped and fell in the river and remained there for about an hour.  He was eventually able to pull himself from the river.  Since then he had a falre-up of low back pain and has felt "swimmy headed."   He has had a couple of falls since then, one time when he turned to quickly.  He came into the clinic today without an assistive device.  I highly recommended he use a cane at all times.  He son whom was present agreed and has recommended  this to is Father in the past.     Patient is accompained by: Family member   Limitations Walking   Patient Stated Goals Walk better and reduce my pain.   Currently in Pain? Other (Comment)  Patient has low back pain but is being treated for balance problems currenty.            [facility] PT Assessment - 03/04/16 0001      Assessment   Medical Diagnosis Balance disorder.   Referring Provider [name] MD   Onset Date/Surgical Date --  March/April 2017.     Precautions   Precautions Fall  "Four hernias."     Restrictions   Weight Bearing Restrictions No     Balance Screen   Has the patient fallen in the past 6 months Yes   How many times? --  3.   Has the patient had a decrease in activity level because of a fear of falling?  Yes    Is the patient reluctant to leave their home because of a fear of falling?  No     Home Environment   Living Environment Private residence     Prior Function   Level of Independence Independent with basic ADLs     Posture/Postural Control   Posture/Postural Control Postural limitations   Postural Limitations Rounded Shoulders;Forward head;Decreased lumbar lordosis     ROM / Strength   AROM / PROM / Strength AROM;Strength     AROM   Overall AROM Comments Normal bilateral LE ROM.     Strength   Overall Strength Comments Left hip abduction and flexion= 3+/5; left knee strength= 4 to 4+/5.  Right hip flexion and abduction= 4/5 and right knee strength= 4+/5.     Ambulation/Gait   Gait Pattern Step-to pattern;Decreased step length - right;Decreased step length - left;Decreased stride length;Shuffle;Poor foot clearance - left;Poor foot clearance - right     Standardized Balance Assessment   Standardized Balance Assessment Berg Balance Test     Berg Balance Test   Sit to Stand Able to stand  independently using hands   Standing Unsupported Able to stand 2 minutes with supervision   Sitting with Back Unsupported but Feet Supported on Floor or Stool Able to sit safely and securely 2 minutes   Stand to Sit Controls descent by using hands   Transfers Able to transfer safely, definite need of hands   Standing Unsupported with Eyes Closed Able to stand 10 seconds with supervision   Standing Ubsupported with Feet Together Able to place feet together independently and stand for 1 minute with supervision   From Standing, Reach Forward with Outstretched Arm Can reach confidently >25 cm (10")   From Standing Position, Pick up Object from Floor Unable to pick up shoe, but reaches 2-5 cm (1-2") from shoe and balances independently   From Standing Position, Turn to Look Behind Over each Shoulder Looks behind one side only/other side shows less weight shift   Turn 360 Degrees Able to turn 360  degrees safely but slowly   Standing Unsupported, Alternately Place Feet on Step/Stool Able to complete 4 steps without aid or supervision   Standing Unsupported, One Foot in Front Needs help to step but can hold 15 seconds   Standing on One Leg Tries to lift leg/unable to hold 3 seconds but remains standing independently   Total Score 37  PT Short Term Goals - 03/04/16 1230      PT SHORT TERM GOAL #1   Title Independent with a HEP.   Time 4   Period Weeks   Status New     PT SHORT TERM GOAL #2   Title Increase Berg score to 40/56.   Time 4   Period Weeks   Status New           PT Long Term Goals - 03/04/16 1231      PT LONG TERM GOAL #1   Title Increase Berg score to a 47-48/56.   Time 8   Period Weeks   Status New     PT LONG TERM GOAL #2   Title Increase bilateral hip strength to a solid 4+/5.   Time 8   Period Weeks   Status New     PT LONG TERM GOAL #3   Title Walk in clinic with supervision only with a straight cane.   Time 8   Period Weeks   Status New               Plan - 03/04/16 1220    Clinical Impression Statement The patient has notable loss of bilateral hip strength and scored a 37/56 on his Berg test.  His gait is shuffled innature and needs to be using a cane at all times for safety.  His is a fall risk and should be monitored at all times.   Rehab Potential Good   PT Frequency 2x / week   PT Duration 8 weeks   PT Treatment/Interventions ADLs/Self Care Home Management;Functional mobility training;Therapeutic activities;Therapeutic exercise;Balance training;Neuromuscular re-education;Patient/family education   PT Next Visit Plan Please progress patient through a gait and balance program.  Include Nustep and LE strengthening as well as core exercises.   Consulted and Agree with Plan of Care Patient;Family member/caregiver      Patient will benefit from skilled therapeutic intervention in  order to improve the following deficits and impairments:  Abnormal gait, Decreased activity tolerance, Decreased mobility, Decreased strength  Visit Diagnosis: Other abnormalities of gait and mobility - Plan: PT plan of care cert/re-cert  Muscle weakness (generalized) - Plan: PT plan of care cert/re-cert     Problem List Patient Active Problem List   Diagnosis Date Noted  . Atrial fibrillation ([place]) 11/21/2015  . Sinus node dysfunction ([place]) 11/20/2015  . Syncope 11/20/2015  . BPH (benign prostatic hyperplasia) 07/07/2015  . Hyperlipemia 07/07/2015  . Ventral hernia 05/31/2013  . Carpal tunnel syndrome, bilateral 05/07/2011  . Ethanolism ([place])   . COPD (chronic obstructive pulmonary disease) ([place])   . ADENOMATOUS COLONIC POLYP 04/18/2008  . Essential hypertension 04/18/2008  . Coronary atherosclerosis 04/18/2008  . AAA 04/18/2008  . PVD 04/18/2008  . GERD 04/18/2008  . DIVERTICULOSIS, COLON 04/18/2008    Zaine Elsass, [place] MPT 03/04/2016, 12:35 PM  [facility] [address] [place], [place], [address] Phone: [phone]   Fax:  [phone]  Name: TYQUAN EILERS MRN: [id number] Date of Birth: 06/14/1927

## 2016-03-06 ENCOUNTER — Other Ambulatory Visit: Payer: Self-pay | Admitting: Family Medicine

## 2016-03-06 ENCOUNTER — Encounter: Payer: Self-pay | Admitting: Internal Medicine

## 2016-03-06 ENCOUNTER — Ambulatory Visit (INDEPENDENT_AMBULATORY_CARE_PROVIDER_SITE_OTHER): Payer: Medicare Other | Admitting: Internal Medicine

## 2016-03-06 ENCOUNTER — Encounter (INDEPENDENT_AMBULATORY_CARE_PROVIDER_SITE_OTHER): Payer: Self-pay

## 2016-03-06 VITALS — BP 106/82 | HR 80 | Ht 66.0 in | Wt 192.0 lb

## 2016-03-06 DIAGNOSIS — I2510 Atherosclerotic heart disease of native coronary artery without angina pectoris: Secondary | ICD-10-CM | POA: Diagnosis not present

## 2016-03-06 DIAGNOSIS — I2584 Coronary atherosclerosis due to calcified coronary lesion: Secondary | ICD-10-CM

## 2016-03-06 DIAGNOSIS — I480 Paroxysmal atrial fibrillation: Secondary | ICD-10-CM

## 2016-03-06 DIAGNOSIS — Z950 Presence of cardiac pacemaker: Secondary | ICD-10-CM | POA: Diagnosis not present

## 2016-03-06 LAB — CUP PACEART INCLINIC DEVICE CHECK
Battery Remaining Longevity: 113 mo
Brady Statistic AP VS Percent: 14.55 %
Brady Statistic AS VS Percent: 4.12 %
Brady Statistic RV Percent Paced: 81.33 %
Implantable Lead Implant Date: 20170703
Implantable Lead Implant Date: 20170703
Implantable Lead Location: 753860
Lead Channel Impedance Value: 342 Ohm
Lead Channel Impedance Value: 513 Ohm
Lead Channel Pacing Threshold Amplitude: 1 V
Lead Channel Pacing Threshold Pulse Width: 0.4 ms
Lead Channel Pacing Threshold Pulse Width: 0.4 ms
Lead Channel Sensing Intrinsic Amplitude: 10 mV
Lead Channel Setting Pacing Amplitude: 2 V
Lead Channel Setting Pacing Pulse Width: 0.4 ms
MDC IDC LEAD LOCATION: 753859
MDC IDC MSMT BATTERY VOLTAGE: 3.03 V
MDC IDC MSMT LEADCHNL RA PACING THRESHOLD AMPLITUDE: 0.875 V
MDC IDC MSMT LEADCHNL RA SENSING INTR AMPL: 3.75 mV
MDC IDC MSMT LEADCHNL RV IMPEDANCE VALUE: 323 Ohm
MDC IDC MSMT LEADCHNL RV IMPEDANCE VALUE: 437 Ohm
MDC IDC SESS DTM: 20171018092417
MDC IDC SET LEADCHNL RA PACING AMPLITUDE: 1.75 V
MDC IDC SET LEADCHNL RV SENSING SENSITIVITY: 0.9 mV
MDC IDC STAT BRADY AP VP PERCENT: 81.3 %
MDC IDC STAT BRADY AS VP PERCENT: 0.03 %
MDC IDC STAT BRADY RA PERCENT PACED: 95.85 %

## 2016-03-06 NOTE — Patient Instructions (Addendum)
Medication Instructions:  Your physician has recommended you make the following change in your medication:  1) STOP Diltiazem    Labwork: None Ordered   Testing/Procedures: None Ordered   Follow-Up: Your physician wants you to follow-up in: 9 months with Dr. Lovena Le. You will receive a reminder letter in the mail two months in advance. If you don't receive a letter, please call our office to schedule the follow-up appointment.  Remote monitoring is used to monitor your Pacemaker from home. This monitoring reduces the number of office visits required to check your device to one time per year. It allows [place] to keep an eye on the functioning of your device to ensure it is working properly. You are scheduled for a device check from home on 06/05/16. You may send your transmission at any time that day. If you have a wireless device, the transmission will be sent automatically. After your physician reviews your transmission, you will receive a postcard with your next transmission date.    Any Other Special Instructions Will Be Listed Below (If Applicable).     If you need a refill on your cardiac medications before your next appointment, please call your pharmacy.

## 2016-03-06 NOTE — Progress Notes (Addendum)
HPI Mr. Sia returns today for ongoing evaluation of bradycardia and newly diagnosed atrial fib, s/p PPM insertion. He underwent PPM insertion back in the summer. Since then, he has felt more energy. He is still somewhat clumsy and experiences dizzy spells.  Allergies  Allergen Reactions  . Aspirin Shortness Of Breath and Swelling  . Atorvastatin Other (See Comments)    Leg pain, feet pain  . Cephalexin Other (See Comments)    unknown     Current Outpatient Prescriptions  Medication Sig Dispense Refill  . acetaminophen (TYLENOL) 500 MG chewable tablet Chew 500 mg by mouth every 6 (six) hours as needed for pain.    Marland Kitchen albuterol (PROVENTIL HFA;VENTOLIN HFA) 108 (90 Base) MCG/ACT inhaler Inhale 2 puffs into the lungs every 6 (six) hours as needed for wheezing or shortness of breath. 1 Inhaler 2  . apixaban (ELIQUIS) 5 MG TABS tablet Take 5 mg by mouth 2 (two) times daily.    . celecoxib (CELEBREX) 200 MG capsule TAKE 1 CAPSULE (200 MG TOTAL) BY MOUTH DAILY. WITH FOOD. FOR JOINT PAIN 30 capsule 2  . clobetasol (TEMOVATE) 0.05 % external solution APPLY 2-3 DROPS TO AFFECTED AREAS OF SCALP TWICE A DAY 50 mL 2  . diltiazem (CARDIZEM CD) 120 MG 24 hr capsule TAKE 1 CAPSULE (120 MG TOTAL) BY MOUTH DAILY. 30 capsule 5  . fish oil-omega-3 fatty acids 1000 MG capsule Take 1 g by mouth daily.      . fluocinolone (VANOS) 0.01 % cream Apply 1 application topically daily as needed (for skin).     . furosemide (LASIX) 20 MG tablet Take 2 tablets (40 mg total) by mouth daily. 60 tablet 11  . isosorbide mononitrate (IMDUR) 30 MG 24 hr tablet TAKE 1 TABLET BY MOUTH ONCE A DAY 30 tablet 2  . ketoconazole (NIZORAL) 2 % shampoo Use 2-3 times weekly. Leave on for 5 min. Rinse. 120 mL 0  . loratadine (CLARITIN) 10 MG tablet TAKE 1 TABLET (10 MG TOTAL) BY MOUTH DAILY. 30 tablet 1  . Melatonin 5 MG TABS Take 5 mg by mouth at bedtime.    . metoprolol tartrate (LOPRESSOR) 25 MG tablet Take 1 tablet (25 mg  total) by mouth 2 (two) times daily. 60 tablet 2  . Multiple Vitamins-Minerals (PRESERVISION AREDS PO) Take 1 tablet by mouth daily.    . pantoprazole (PROTONIX) 20 MG tablet Take 20 mg by mouth daily as needed for heartburn or indigestion.     . potassium chloride SA (K-DUR,KLOR-CON) 20 MEQ tablet Take 1 tablet (20 mEq total) by mouth daily. 90 tablet 3  . rosuvastatin (CRESTOR) 10 MG tablet Take 5 mg by mouth daily.    . tamsulosin (FLOMAX) 0.4 MG CAPS capsule Take 2 capsules (0.8 mg total) by mouth at bedtime. 180 capsule 3  . triamcinolone cream (KENALOG) 0.1 % Apply 1 application topically 2 (two) times daily. Apply to AA of trunk, arms, legs. Avoid underarms, genitals, face, groin. 453.6 g 3  . TURMERIC CURCUMIN PO Take 1 tablet by mouth daily.     No current facility-administered medications for this visit.      Past Medical History:  Diagnosis Date  . Ascending aortic aneurysm (HCC)    Dr. Roxan Hockey  . CAD (coronary artery disease)    s/p stent to OM2 in past;  Last LHC 3/05: EF 60%, left RA occluded, right RA ok, prox to mid LAD 60%, oD1 75-80%, oD2 small 75%, pOM1 50%, OM2  stent ok with 50% before stent, mRCA 30%, dRCA 30%, PDA 70%.  Last dobutamine myoview 5/06: no scar or ischemia, EF 60%.  . Chronic lower back pain   . COPD (chronic obstructive pulmonary disease) ([place])   . DJD (degenerative joint disease)   . Enlarged prostate   . Ethanolism ([place])   . GERD (gastroesophageal reflux disease)   . Hernia, abdominal    "he has ~ 4; wears binder" (11/20/2015)  . HLD (hyperlipidemia)   . HTN (hypertension)    Last echo 1/07:  Normal LVF, LV thickness upper limits of normal, mild aortic root dilatation, mild to mod MAC, mild LAE, borderline RVH, mild RAE.  . Macular degeneration   . PAD (peripheral artery disease) ([place])   . Paroxysmal atrial fibrillation (HCC)   . Psoriasis   . S/P AAA repair   . Sinus node dysfunction (HCC)    a. s/p MDT dual chamber PPM with His Bundle  pacing  . Tobacco abuse   . Unilateral congenital absence of kidney     ROS:   All systems reviewed and negative except as noted in the HPI.   Past Surgical History:  Procedure Laterality Date  . APPENDECTOMY    . CARPAL TUNNEL RELEASE Right 12/26/2014   Procedure: RIGHT ENDOSCOPIC CARPAL TUNNEL SYNDROME RELEASE;  Surgeon: Milly Jakob, MD;  Location: [place];  Service: Orthopedics;  Laterality: Right;  . CATARACT EXTRACTION, BILATERAL Bilateral   . CORONARY ANGIOPLASTY WITH STENT PLACEMENT     s/p stent to OM2 in past;   . EP IMPLANTABLE DEVICE N/A 11/20/2015   Procedure: Pacemaker Implant;  Surgeon: Evans Lance, MD;  Location: [place] CV LAB;  Service: Cardiovascular;  Laterality: N/A;  . HERNIA REPAIR    . INSERT / REPLACE / REMOVE PACEMAKER  11/20/2015  . RESECTION AND GRAFTING OF INFARENAL  ABDOMINAL  AORTIC ANEURYSM  05/03/2002     Family History  Problem Relation Age of Onset  . Cancer Mother     uterine  . Stroke Mother     hemi plagic  . Cancer Sister   . COPD Sister   . COPD Brother   . Cancer Brother     Lip  . Early death Sister      Social History   Social History  . Marital status: Widowed    Spouse name: N/A  . Number of children: N/A  . Years of education: N/A   Occupational History  . Not on file.   Social History Main Topics  . Smoking status: Former Smoker    Packs/day: 2.50    Years: 52.00    Types: Cigarettes    Quit date: 05/06/1996  . Smokeless tobacco: Never Used  . Alcohol use Yes     Comment: 11/20/2015 "used to drink heavily; quit 05/06/1996"  . Drug use: No  . Sexual activity: No   Other Topics Concern  . Not on file   Social History Narrative  . No narrative on file     BP 106/82   Pulse 80   Ht 5\' 6"  (1.676 m)   Wt 192 lb (87.1 kg)   BMI 30.99 kg/m   Physical Exam:  Well appearing elderly man, NAD HEENT: Unremarkable Neck:  6 cm JVD, no thyromegally Lymphatics:  No adenopathy Back:   No CVA tenderness Lungs:  Clear with no wheezes HEART:  IRegular rate rhythm, no murmurs, no rubs, no clicks Abd:  soft, positive bowel sounds, no organomegally,  no rebound, no guarding Ext:  2 plus pulses, no edema, no cyanosis, no clubbing Skin:  No rashes no nodules Neuro:  CN II through XII intact, motor grossly intact  ECG - NSR with AV pacing  Assess/Plan: 1. Atrial fib - he appears to be maintaining NSR fairly well. He will continue Eliquis for now.  2. Transient AV block - he now has CHB and is pacing over 80% in the ventricle.  3. AAA - he has been followed by vascular surgery. I suspect he would not be a surgical candidate for an open approach. 4 CAD - he denies anginal symptoms. Will follow. 5. PPM - his medtronic DDD PM is working normally. Will recheck in several months. 6. Dizzy - his blood pressure is a little low and I have asked him to hold his calcium channel blocker.  Mikle Bosworth.D.

## 2016-03-11 ENCOUNTER — Encounter: Payer: Self-pay | Admitting: Physical Therapy

## 2016-03-11 ENCOUNTER — Ambulatory Visit: Payer: Medicare Other | Admitting: Physical Therapy

## 2016-03-11 DIAGNOSIS — R2689 Other abnormalities of gait and mobility: Secondary | ICD-10-CM | POA: Diagnosis not present

## 2016-03-11 DIAGNOSIS — M6281 Muscle weakness (generalized): Secondary | ICD-10-CM | POA: Diagnosis not present

## 2016-03-11 NOTE — Therapy (Signed)
[facility] [place], [place], [address] Phone: [phone]   Fax:  [phone]  Physical Therapy Treatment  Patient Details  Name: Brad [name] MRN: [id number] Date of Birth: March 26, 1928 Referring Provider: [name] MD  Encounter Date: 03/11/2016      PT End of Session - 03/11/16 1026    Visit Number 2   Number of Visits 12   Date for PT Re-Evaluation 05/03/16   PT Start Time 0946   PT Stop Time 1027   PT Time Calculation (min) 41 min   Activity Tolerance Patient tolerated treatment well   Behavior During Therapy [facility] for tasks assessed/performed      Past Medical History:  Diagnosis Date  . Ascending aortic aneurysm (HCC)    Dr. Roxan Hockey  . CAD (coronary artery disease)    s/p stent to OM2 in past;  Last LHC 3/05: EF 60%, left RA occluded, right RA ok, prox to mid LAD 60%, oD1 75-80%, oD2 small 75%, pOM1 50%, OM2 stent ok with 50% before stent, mRCA 30%, dRCA 30%, PDA 70%.  Last dobutamine myoview 5/06: no scar or ischemia, EF 60%.  . Chronic lower back pain   . COPD (chronic obstructive pulmonary disease) ([place])   . DJD (degenerative joint disease)   . Enlarged prostate   . Ethanolism ([place])   . GERD (gastroesophageal reflux disease)   . Hernia, abdominal    "he has ~ 4; wears binder" (11/20/2015)  . HLD (hyperlipidemia)   . HTN (hypertension)    Last echo 1/07:  Normal LVF, LV thickness upper limits of normal, mild aortic root dilatation, mild to mod MAC, mild LAE, borderline RVH, mild RAE.  . Macular degeneration   . PAD (peripheral artery disease) ([place])   . Paroxysmal atrial fibrillation (HCC)   . Psoriasis   . S/P AAA repair   . Sinus node dysfunction (HCC)    a. s/p MDT dual chamber PPM with His Bundle pacing  . Tobacco abuse   . Unilateral congenital absence of kidney     Past Surgical History:  Procedure Laterality Date  . APPENDECTOMY    . CARPAL TUNNEL RELEASE Right 12/26/2014   Procedure: RIGHT ENDOSCOPIC CARPAL TUNNEL SYNDROME RELEASE;  Surgeon: Milly Jakob, MD;  Location: [place];  Service: Orthopedics;  Laterality: Right;  . CATARACT EXTRACTION, BILATERAL Bilateral   . CORONARY ANGIOPLASTY WITH STENT PLACEMENT     s/p stent to OM2 in past;   . EP IMPLANTABLE DEVICE N/A 11/20/2015   Procedure: Pacemaker Implant;  Surgeon: Evans Lance, MD;  Location: [place] [facility];  Service: Cardiovascular;  Laterality: N/A;  . HERNIA REPAIR    . INSERT / REPLACE / REMOVE PACEMAKER  11/20/2015  . RESECTION AND GRAFTING OF INFARENAL  ABDOMINAL  AORTIC ANEURYSM  05/03/2002    There were no vitals filed for this visit.      Subjective Assessment - 03/11/16 0952    Subjective Patient arrived with son today and reported feeling ok after last treatment   Patient is accompained by: Family member   Limitations Walking   Patient Stated Goals Walk better and reduce my pain.   Currently in Pain? No/denies                         [facility] Adult PT Treatment/Exercise - 03/11/16 0001      Exercises   Exercises Lumbar;Knee/Hip     Lumbar Exercises: Aerobic  Stationary Bike Nusep 30min L3, monitored  patient felt some fatigue may be due to pacemakerHR/O2 WNL      Lumbar Exercises: Seated   Long Arc Quad on Chair --   LAQ on Chair Weights (lbs) --   Other Seated Lumbar Exercises --     Knee/Hip Exercises: Seated   Long Arc Quad Strengthening;Both;10 reps;3 sets;Weights   Long Arc Quad Weight 2 lbs.   Marching Strengthening;Both;2 sets;10 reps;Weights   Marching Limitations 2   Abduction/Adduction  Strengthening;Both;2 sets;10 reps  green t-band   Sit to Sand 5 reps;with UE support  cues for technique     Knee/Hip Exercises: Supine   Straight Leg Raises AROM;Strengthening;Both;10 reps;1 set             Balance Exercises - 03/11/16 1010      Balance Exercises: Standing   Standing Eyes Opened Wide (BOA);Solid  surface;Foam/compliant surface;Time  CGA/SBA x3 min each   Step Ups 6 inch;Forward;UE support 2  toe taps 2x10 SBA/CGA             PT Short Term Goals - 03/04/16 1230      PT SHORT TERM GOAL #1   Title Independent with a HEP.   Time 4   Period Weeks   Status New     PT SHORT TERM GOAL #2   Title Increase Berg score to 40/56.   Time 4   Period Weeks   Status New           PT Long Term Goals - 03/04/16 1231      PT LONG TERM GOAL #1   Title Increase Berg score to a 47-48/56.   Time 8   Period Weeks   Status New     PT LONG TERM GOAL #2   Title Increase bilateral hip strength to a solid 4+/5.   Time 8   Period Weeks   Status New     PT LONG TERM GOAL #3   Title Walk in clinic with supervision only with a straight cane.   Time 8   Period Weeks   Status New               Plan - 03/11/16 1033    Clinical Impression Statement Patient tolerated treatment well today. Patient able to perform nustep yet had some slight fatigue and pulling sensation on area of pacemaker, ended nustep early to allow patient to rest and vitals were WNL after. Patient required CGA/SBA throughout treatment for safety and verbal cues for technique. Patient has some ongoing back pain and unable to tolerate hip bridge at this time. Patient had no LOB duering balance exercises, yet some noted fatigue with prolong standing exercises. Current goals ongoing due to balance and strength deficits.    Rehab Potential Good   PT Frequency 2x / week   PT Duration 8 weeks   PT Treatment/Interventions ADLs/Self Care Home Management;Functional mobility training;Therapeutic activities;Therapeutic exercise;Balance training;Neuromuscular re-education;Patient/family education   PT Next Visit Plan cont with POC per MPT to progress patient through a gait and balance program.  Include Nustep and LE strengthening as well as core exercises. (per patient tolerance due to recent pacemaker per patient)   PT  Home Exercise Plan issue HEP next treatment      Patient will benefit from skilled therapeutic intervention in order to improve the following deficits and impairments:  Abnormal gait, Decreased activity tolerance, Decreased mobility, Decreased strength  Visit Diagnosis: Other abnormalities of gait and mobility  Muscle weakness (generalized)     Problem List Patient Active Problem List   Diagnosis Date Noted  . Atrial fibrillation ([place]) 11/21/2015  . Sinus node dysfunction ([place]) 11/20/2015  . Syncope 11/20/2015  . BPH (benign prostatic hyperplasia) 07/07/2015  . Hyperlipemia 07/07/2015  . Ventral hernia 05/31/2013  . Carpal tunnel syndrome, bilateral 05/07/2011  . Ethanolism ([place])   . COPD (chronic obstructive pulmonary disease) ([place])   . ADENOMATOUS COLONIC POLYP 04/18/2008  . Essential hypertension 04/18/2008  . Coronary atherosclerosis 04/18/2008  . AAA 04/18/2008  . PVD 04/18/2008  . GERD 04/18/2008  . DIVERTICULOSIS, COLON 04/18/2008    Mariany Mackintosh P, PTA 03/11/2016, 10:40 AM  [facility] [place], [place], [address] Phone: [phone]   Fax:  [phone]  Name: Brad [name] MRN: [id number] Date of Birth: Sep 28, 1927

## 2016-03-13 ENCOUNTER — Other Ambulatory Visit: Payer: Self-pay | Admitting: Thoracic Surgery (Cardiothoracic Vascular Surgery)

## 2016-03-13 DIAGNOSIS — I712 Thoracic aortic aneurysm, without rupture: Secondary | ICD-10-CM

## 2016-03-13 DIAGNOSIS — I7121 Aneurysm of the ascending aorta, without rupture: Secondary | ICD-10-CM

## 2016-03-14 DIAGNOSIS — R351 Nocturia: Secondary | ICD-10-CM | POA: Diagnosis not present

## 2016-03-14 DIAGNOSIS — N401 Enlarged prostate with lower urinary tract symptoms: Secondary | ICD-10-CM | POA: Diagnosis not present

## 2016-03-19 ENCOUNTER — Other Ambulatory Visit: Payer: Self-pay | Admitting: Family Medicine

## 2016-03-19 DIAGNOSIS — I10 Essential (primary) hypertension: Secondary | ICD-10-CM

## 2016-03-19 MED ORDER — FUROSEMIDE 20 MG PO TABS: 40.0000 mg | ORAL_TABLET | Freq: Every day | ORAL | 11 refills | Status: DC

## 2016-03-20 ENCOUNTER — Ambulatory Visit: Payer: Medicare Other | Attending: Family Medicine | Admitting: Physical Therapy

## 2016-03-20 DIAGNOSIS — R2689 Other abnormalities of gait and mobility: Secondary | ICD-10-CM | POA: Insufficient documentation

## 2016-03-20 DIAGNOSIS — M6281 Muscle weakness (generalized): Secondary | ICD-10-CM | POA: Diagnosis not present

## 2016-03-20 NOTE — Therapy (Signed)
[facility] [place], [place], [address] Phone: [phone]   Fax:  [phone]  Physical Therapy Treatment  Patient Details  Name: Brad [name] MRN: [id number] Date of Birth: 1927-11-29 Referring Provider: [name] MD  Encounter Date: 03/20/2016      PT End of Session - 03/20/16 1147    Visit Number 3   Number of Visits 12   Date for PT Re-Evaluation 05/03/16   PT Start Time 0945   PT Stop Time 1034   PT Time Calculation (min) 49 min   Activity Tolerance Patient tolerated treatment well   Behavior During Therapy [facility] for tasks assessed/performed      Past Medical History:  Diagnosis Date  . Ascending aortic aneurysm (HCC)    Dr. Roxan Hockey  . CAD (coronary artery disease)    s/p stent to OM2 in past;  Last LHC 3/05: EF 60%, left RA occluded, right RA ok, prox to mid LAD 60%, oD1 75-80%, oD2 small 75%, pOM1 50%, OM2 stent ok with 50% before stent, mRCA 30%, dRCA 30%, PDA 70%.  Last dobutamine myoview 5/06: no scar or ischemia, EF 60%.  . Chronic lower back pain   . COPD (chronic obstructive pulmonary disease) ([place])   . DJD (degenerative joint disease)   . Enlarged prostate   . 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 GERD (gastroesophageal reflux disease)   . Hernia, abdominal    "he has ~ 4; wears binder" (11/20/2015)  . HLD (hyperlipidemia)   . HTN (hypertension)    Last echo 1/07:  Normal LVF, LV thickness upper limits of normal, mild aortic root dilatation, mild to mod MAC, mild LAE, borderline RVH, mild RAE.  . Macular degeneration   . PAD (peripheral artery disease) ([place])   . Paroxysmal atrial fibrillation (HCC)   . Psoriasis   . S/P AAA repair   . Sinus node dysfunction (HCC)    a. s/p MDT dual chamber PPM with His Bundle pacing  . Tobacco abuse   . Unilateral congenital absence of kidney     Past Surgical History:  Procedure Laterality Date  . APPENDECTOMY    . CARPAL TUNNEL RELEASE Right 12/26/2014   Procedure:  RIGHT ENDOSCOPIC CARPAL TUNNEL SYNDROME RELEASE;  Surgeon: Milly Jakob, MD;  Location: [place];  Service: Orthopedics;  Laterality: Right;  . CATARACT EXTRACTION, BILATERAL Bilateral   . CORONARY ANGIOPLASTY WITH STENT PLACEMENT     s/p stent to OM2 in past;   . EP IMPLANTABLE DEVICE N/A 11/20/2015   Procedure: Pacemaker Implant;  Surgeon: Evans Lance, MD;  Location: [place] [facility];  Service: Cardiovascular;  Laterality: N/A;  . HERNIA REPAIR    . INSERT / REPLACE / REMOVE PACEMAKER  11/20/2015  . RESECTION AND GRAFTING OF INFARENAL  ABDOMINAL  AORTIC ANEURYSM  05/03/2002    There were no vitals filed for this visit.      Subjective Assessment - 03/20/16 1136    Subjective I think these treatments are helping.   Patient Stated Goals Walk better and reduce my pain.                         [place] Adult PT Treatment/Exercise - 03/20/16 0001      Exercises   Exercises Knee/Hip     Lumbar Exercises: Aerobic   Stationary Bike Nustep level 3 x 20 minutes with 02 at 94-95%.     Knee/Hip Exercises: Standing  Forward Step Up Limitations Multiple sets and reps to fatigue 4 inch.   Rocker Board Limitations 5 minutes for Neuro re-edu. x 5 minutes.     Knee/Hip Exercises: Seated   Long Arc Quad Limitations 3# multiple sets and reps to fatigue.   Marching Limitations 3# with assisit multiple reps and sets to fatigue.                  PT Short Term Goals - 03/04/16 1230      PT SHORT TERM GOAL #1   Title Independent with a HEP.   Time 4   Period Weeks   Status New     PT SHORT TERM GOAL #2   Title Increase Berg score to 40/56.   Time 4   Period Weeks   Status New           PT Long Term Goals - 03/04/16 1231      PT LONG TERM GOAL #1   Title Increase Berg score to a 47-48/56.   Time 8   Period Weeks   Status New     PT LONG TERM GOAL #2   Title Increase bilateral hip strength to a solid 4+/5.   Time 8   Period Weeks    Status New     PT LONG TERM GOAL #3   Title Walk in clinic with supervision only with a straight cane.   Time 8   Period Weeks   Status New               Plan - 03/20/16 1147    Clinical Impression Statement Patient had an increase tolerance for ther ex today.  His 02 didnot drop below 94% today.      Patient will benefit from skilled therapeutic intervention in order to improve the following deficits and impairments:  Abnormal gait, Decreased activity tolerance, Decreased mobility, Decreased strength  Visit Diagnosis: Other abnormalities of gait and mobility  Muscle weakness (generalized)     Problem List Patient Active Problem List   Diagnosis Date Noted  . Atrial fibrillation ([place]) 11/21/2015  . Sinus node dysfunction ([place]) 11/20/2015  . Syncope 11/20/2015  . BPH (benign prostatic hyperplasia) 07/07/2015  . Hyperlipemia 07/07/2015  . Ventral hernia 05/31/2013  . Carpal tunnel syndrome, bilateral 05/07/2011  . Ethanolism ([place])   . COPD (chronic obstructive pulmonary disease) ([place])   . ADENOMATOUS COLONIC POLYP 04/18/2008  . Essential hypertension 04/18/2008  . Coronary atherosclerosis 04/18/2008  . AAA 04/18/2008  . PVD 04/18/2008  . GERD 04/18/2008  . DIVERTICULOSIS, COLON 04/18/2008    Aveah Castell, [place] 03/20/2016, 11:56 AM  [facility] [address] [place], [place], [address] Phone: [phone]   Fax:  [phone]  Name: Brad [name] MRN: [id number] Date of Birth: Oct 16, 1927

## 2016-03-23 ENCOUNTER — Other Ambulatory Visit: Payer: Self-pay | Admitting: Family Medicine

## 2016-03-27 ENCOUNTER — Ambulatory Visit: Payer: Medicare Other | Admitting: Physical Therapy

## 2016-03-27 ENCOUNTER — Encounter: Payer: Self-pay | Admitting: Physical Therapy

## 2016-03-27 VITALS — HR 78

## 2016-03-27 DIAGNOSIS — R2689 Other abnormalities of gait and mobility: Secondary | ICD-10-CM | POA: Diagnosis not present

## 2016-03-27 DIAGNOSIS — M6281 Muscle weakness (generalized): Secondary | ICD-10-CM

## 2016-03-27 NOTE — Therapy (Signed)
[facility] [place], [place], [address] Phone: [phone]   Fax:  [phone]  Physical Therapy Treatment  Patient Details  Name: Brad [name] MRN: [id number] Date of Birth: 17-Jul-1927 Referring Provider: [name] MD  Encounter Date: 03/27/2016      PT End of Session - 03/27/16 0905    Visit Number 4   Number of Visits 12   Date for PT Re-Evaluation 05/03/16   PT Start Time 0903   PT Stop Time 0948   PT Time Calculation (min) 45 min   Activity Tolerance Patient tolerated treatment well   Behavior During Therapy [facility] for tasks assessed/performed      Past Medical History:  Diagnosis Date  . Ascending aortic aneurysm (HCC)    Dr. Roxan Hockey  . CAD (coronary artery disease)    s/p stent to OM2 in past;  Last LHC 3/05: EF 60%, left RA occluded, right RA ok, prox to mid LAD 60%, oD1 75-80%, oD2 small 75%, pOM1 50%, OM2 stent ok with 50% before stent, mRCA 30%, dRCA 30%, PDA 70%.  Last dobutamine myoview 5/06: no scar or ischemia, EF 60%.  . Chronic lower back pain   . COPD (chronic obstructive pulmonary disease) ([place])   . DJD (degenerative joint disease)   . Enlarged prostate   . Ethanolism ([place])   . GERD (gastroesophageal reflux disease)   . Hernia, abdominal    "he has ~ 4; wears binder" (11/20/2015)  . HLD (hyperlipidemia)   . HTN (hypertension)    Last echo 1/07:  Normal LVF, LV thickness upper limits of normal, mild aortic root dilatation, mild to mod MAC, mild LAE, borderline RVH, mild RAE.  . Macular degeneration   . PAD (peripheral artery disease) ([place])   . Paroxysmal atrial fibrillation (HCC)   . Psoriasis   . S/P AAA repair   . Sinus node dysfunction (HCC)    a. s/p MDT dual chamber PPM with His Bundle pacing  . Tobacco abuse   . Unilateral congenital absence of kidney     Past Surgical History:  Procedure Laterality Date  . APPENDECTOMY    . CARPAL TUNNEL RELEASE Right 12/26/2014   Procedure:  RIGHT ENDOSCOPIC CARPAL TUNNEL SYNDROME RELEASE;  Surgeon: Milly Jakob, MD;  Location: [place];  Service: Orthopedics;  Laterality: Right;  . CATARACT EXTRACTION, BILATERAL Bilateral   . CORONARY ANGIOPLASTY WITH STENT PLACEMENT     s/p stent to OM2 in past;   . EP IMPLANTABLE DEVICE N/A 11/20/2015   Procedure: Pacemaker Implant;  Surgeon: Evans Lance, MD;  Location: [place] [facility];  Service: Cardiovascular;  Laterality: N/A;  . HERNIA REPAIR    . INSERT / REPLACE / REMOVE PACEMAKER  11/20/2015  . RESECTION AND GRAFTING OF INFARENAL  ABDOMINAL  AORTIC ANEURYSM  05/03/2002    Vitals:   03/27/16 0918  Pulse: 78  SpO2: 97%        Subjective Assessment - 03/27/16 0904    Subjective Patient reports that he is doing pretty good this morning. Reported soreness in chest where pacemaker is.   Patient is accompained by: Family member  son, [place]   Limitations Walking   Patient Stated Goals Walk better and reduce my pain.   Currently in Pain? Other (Comment)  Reported shoulder discomfort from UE rests on NuStep             [facility] PT Assessment - 03/27/16 0001      Assessment  Medical Diagnosis Balance disorder.     Precautions   Precautions Fall     Restrictions   Weight Bearing Restrictions No                     [facility] Adult PT Treatment/Exercise - 03/27/16 0001      Lumbar Exercises: Aerobic   Stationary Bike Nustep L5 x15 min; monitored for progression     Knee/Hip Exercises: Seated   Long Arc Quad Strengthening;Both;10 reps;3 sets;Weights   Long Arc Quad Weight 3 lbs.   Clamshell with Thera[employer] [place]  3x10 reps   Sit to Sand 15 reps;without UE support  Reported low back/hips             Balance Exercises - 03/27/16 0955      Balance Exercises: Standing   Standing Eyes Opened Narrow base of support (BOS);Wide (BOA);Foam/compliant surface;Other (comment)  N BOS 2 finger support x3 min, W BOS x 3 min 2 finger suppor    Tandem Stance Other (comment)   Heel Raises Limitations x20 reps   Toe Raise Limitations x20 reps   Other Standing Exercises Semitandem on airex 2 finger support x3 min; marching on floor 2 finger support x20 reps; toe taps 6" step x3 min             PT Short Term Goals - 03/04/16 1230      PT SHORT TERM GOAL #1   Title Independent with a HEP.   Time 4   Period Weeks   Status New     PT SHORT TERM GOAL #2   Title Increase Berg score to 40/56.   Time 4   Period Weeks   Status New           PT Long Term Goals - 03/04/16 1231      PT LONG TERM GOAL #1   Title Increase Berg score to a 47-48/56.   Time 8   Period Weeks   Status New     PT LONG TERM GOAL #2   Title Increase bilateral hip strength to a solid 4+/5.   Time 8   Period Weeks   Status New     PT LONG TERM GOAL #3   Title Walk in clinic with supervision only with a straight cane.   Time 8   Period Weeks   Status New               Plan - 03/27/16 1101    Clinical Impression Statement Patient arrived with treatment with no new complaints and only chest and shoulder soreness from pacemaker and NuStep arms respectively. Vitals were monitored prior to NuStep and following the end of treatment. Vitals taken following treatment were 96% O2 sat and 70 bpm. Patient tolerated LE strengthening fairly well although with sit to stands patient required multiple attempts to stand. Airex was utilized with balance activities as well as two finger support to further challenge in regards to uneven surfaces and limited support. Close supervision was required by PTA during balance activities. Patient was observed as having heavy breathing but refused seat rest breaks during treatment. Patient was encouraged to utilize AD such as cane he has at home for added support during ambulation.   Rehab Potential Good   PT Frequency 2x / week   PT Duration 8 weeks   PT Treatment/Interventions ADLs/Self Care Home  Management;Functional mobility training;Therapeutic activities;Therapeutic exercise;Balance training;Neuromuscular re-education;Patient/family education   PT Next Visit Plan cont with POC per  MPT to progress patient through a gait and balance program.  Include Nustep and LE strengthening as well as core exercises. (per patient tolerance due to recent pacemaker per patient)   PT Home Exercise Plan issue HEP next treatment   Consulted and Agree with Plan of Care Patient;Family member/caregiver   Family Member Consulted Son, [place]      Patient will benefit from skilled therapeutic intervention in order to improve the following deficits and impairments:  Abnormal gait, Decreased activity tolerance, Decreased mobility, Decreased strength  Visit Diagnosis: Other abnormalities of gait and mobility  Muscle weakness (generalized)     Problem List Patient Active Problem List   Diagnosis Date Noted  . Atrial fibrillation ([place]) 11/21/2015  . Sinus node dysfunction ([place]) 11/20/2015  . Syncope 11/20/2015  . BPH (benign prostatic hyperplasia) 07/07/2015  . Hyperlipemia 07/07/2015  . Ventral hernia 05/31/2013  . Carpal tunnel syndrome, bilateral 05/07/2011  . Ethanolism ([place])   . COPD (chronic obstructive pulmonary disease) ([place])   . ADENOMATOUS COLONIC POLYP 04/18/2008  . Essential hypertension 04/18/2008  . Coronary atherosclerosis 04/18/2008  . AAA 04/18/2008  . PVD 04/18/2008  . GERD 04/18/2008  . DIVERTICULOSIS, COLON 04/18/2008    Wynelle Fanny, PTA 03/27/2016, 11:06 AM  [facility] [address] [place], [place], [address] Phone: [phone]   Fax:  [phone]  Name: Brad [name] MRN: [id number] Date of Birth: Jan 04, 1928

## 2016-04-03 ENCOUNTER — Ambulatory Visit: Payer: Medicare Other | Admitting: Physical Therapy

## 2016-04-03 ENCOUNTER — Encounter: Payer: Self-pay | Admitting: Physical Therapy

## 2016-04-03 DIAGNOSIS — R2689 Other abnormalities of gait and mobility: Secondary | ICD-10-CM

## 2016-04-03 DIAGNOSIS — M6281 Muscle weakness (generalized): Secondary | ICD-10-CM

## 2016-04-03 NOTE — Therapy (Addendum)
[facility] [place], [place], [address] Phone: [phone]   Fax:  [phone]  Physical Therapy Treatment  Patient Details  Name: Brad [name] MRN: [id number] Date of Birth: 1927-07-01 Referring Provider: [name] MD  Encounter Date: 04/03/2016      PT End of Session - 04/03/16 1027    Visit Number 5   Number of Visits 12   Date for PT Re-Evaluation 05/03/16   PT Start Time 0946   PT Stop Time 1027   PT Time Calculation (min) 41 min   Activity Tolerance Patient tolerated treatment well   Behavior During Therapy [facility] for tasks assessed/performed      Past Medical History:  Diagnosis Date  . Ascending aortic aneurysm (HCC)    Dr. Roxan Hockey  . CAD (coronary artery disease)    s/p stent to OM2 in past;  Last LHC 3/05: EF 60%, left RA occluded, right RA ok, prox to mid LAD 60%, oD1 75-80%, oD2 small 75%, pOM1 50%, OM2 stent ok with 50% before stent, mRCA 30%, dRCA 30%, PDA 70%.  Last dobutamine myoview 5/06: no scar or ischemia, EF 60%.  . Chronic lower back pain   . COPD (chronic obstructive pulmonary disease) ([place])   . DJD (degenerative joint disease)   . Enlarged prostate   . Ethanolism ([place])   . GERD (gastroesophageal reflux disease)   . Hernia, abdominal    "he has ~ 4; wears binder" (11/20/2015)  . HLD (hyperlipidemia)   . HTN (hypertension)    Last echo 1/07:  Normal LVF, LV thickness upper limits of normal, mild aortic root dilatation, mild to mod MAC, mild LAE, borderline RVH, mild RAE.  . Macular degeneration   . PAD (peripheral artery disease) ([employer])   . Paroxysmal atrial fibrillation (HCC)   . Psoriasis   . S/P AAA repair   . Sinus node dysfunction (HCC)    a. s/p MDT dual chamber PPM with His Bundle pacing  . Tobacco abuse   . Unilateral congenital absence of kidney     Past Surgical History:  Procedure Laterality Date  . APPENDECTOMY    . CARPAL TUNNEL RELEASE Right 12/26/2014   Procedure: RIGHT ENDOSCOPIC CARPAL TUNNEL SYNDROME RELEASE;  Surgeon: Milly Jakob, MD;  Location: [facility];  Service: Orthopedics;  Laterality: Right;  . CATARACT EXTRACTION, BILATERAL Bilateral   . CORONARY ANGIOPLASTY WITH STENT PLACEMENT     s/p stent to OM2 in past;   . EP IMPLANTABLE DEVICE N/A 11/20/2015   Procedure: Pacemaker Implant;  Surgeon: Evans Lance, MD;  Location: [facility];  Service: Cardiovascular;  Laterality: N/A;  . HERNIA REPAIR    . INSERT / REPLACE / REMOVE PACEMAKER  11/20/2015  . RESECTION AND GRAFTING OF INFARENAL  ABDOMINAL  AORTIC ANEURYSM  05/03/2002    There were no vitals filed for this visit.      Subjective Assessment - 04/03/16 0955    Subjective Patient reports that he is doing pretty good this morning.   Patient is accompained by: Family member   Limitations Walking   Patient Stated Goals Walk better and reduce my pain.   Currently in Pain? No/denies            [facility] PT Assessment - 04/03/16 0001      Berg Balance Test   Sit to Stand Able to stand  independently using hands   Standing Unsupported Able to stand 2 minutes with supervision  Sitting with Back Unsupported but Feet Supported on Floor or Stool Able to sit safely and securely 2 minutes   Stand to Sit Controls descent by using hands   Transfers Able to transfer safely, definite need of hands   Standing Unsupported with Eyes Closed Able to stand 10 seconds safely   Standing Ubsupported with Feet Together Able to place feet together independently and stand 1 minute safely   From Standing, Reach Forward with Outstretched Arm Can reach confidently >25 cm (10")   From Standing Position, Pick up Object from Floor Able to pick up shoe, needs supervision   From Standing Position, Turn to Look Behind Over each Shoulder Looks behind one side only/other side shows less weight shift   Turn 360 Degrees Able to turn 360 degrees safely but slowly   Standing  Unsupported, Alternately Place Feet on Step/Stool Able to complete 4 steps without aid or supervision   Standing Unsupported, One Foot in Front Needs help to step but can hold 15 seconds   Standing on One Leg Tries to lift leg/unable to hold 3 seconds but remains standing independently   Total Score 40                     [facility] Adult PT Treatment/Exercise - 04/03/16 0001      Lumbar Exercises: Aerobic   Stationary Bike x97mn L3 LE only , monitored for progression     Knee/Hip Exercises: Seated   Clamshell with TheraBand Green  2x10 each             Balance Exercises - 04/03/16 1011      Balance Exercises: Standing   Standing Eyes Opened Narrow base of support (BOS);Wide (BOA);Foam/compliant surface;Solid surface;2 reps;4 reps   Standing Eyes Closed Wide (BOA);Solid surface;2 reps   Tandem Stance 2 reps;Eyes open;Upper extremity support 1   SLS Eyes open;2 reps  difficulty   Rockerboard Anterior/posterior  2100m   Step Ups Forward;6 inch;UE support 1  2x10   Retro Gait 5 reps  forward/back no UE support, SBA/S   Sidestepping 5 reps   Sit to Stand Time x10             PT Short Term Goals - 04/03/16 1027      PT SHORT TERM GOAL #1   Title Independent with a HEP.   Time 4   Period Weeks   Status On-going     PT SHORT TERM GOAL #2   Title Increase Berg score to 40/56.   Time 4   Period Weeks   Status Achieved  40/56 04/03/16           PT Long Term Goals - 03/04/16 1231      PT LONG TERM GOAL #1   Title Increase Berg score to a 47-48/56.   Time 8   Period Weeks   Status New     PT LONG TERM GOAL #2   Title Increase bilateral hip strength to a solid 4+/5.   Time 8   Period Weeks   Status New     PT LONG TERM GOAL #3   Title Walk in clinic with supervision only with a straight cane.   Time 8   Period Weeks   Status New               Plan - 04/03/16 1027    Clinical Impression Statement Patient progressing with all  activities today. Patient has improved BERG balance score today. Patient  has reported no falls and is able to perform his normal ADL's with greater ease. Patient does not like to use assistive device and was educated to bring one in to practice technique in therapy and was educated on fall prevention and safety with use of assistive device. Patient met STG #2 others ongoing due to strength and balance deficits.   Rehab Potential Good   PT Frequency 2x / week   PT Duration 8 weeks   PT Treatment/Interventions ADLs/Self Care Home Management;Functional mobility training;Therapeutic activities;Therapeutic exercise;Balance training;Neuromuscular re-education;Patient/family education   PT Next Visit Plan cont with POC per MPT to progress patient through a gait and balance program.  Include Nustep and LE strengthening as well as core exercises. (per patient tolerance due to recent pacemaker per patient)   Consulted and Agree with Plan of Care Patient;Family member/caregiver      Patient will benefit from skilled therapeutic intervention in order to improve the following deficits and impairments:  Abnormal gait, Decreased activity tolerance, Decreased mobility, Decreased strength  Visit Diagnosis: Other abnormalities of gait and mobility  Muscle weakness (generalized)     Problem List Patient Active Problem List   Diagnosis Date Noted  . Atrial fibrillation ([place]) 11/21/2015  . Sinus node dysfunction ([place]) 11/20/2015  . Syncope 11/20/2015  . BPH (benign prostatic hyperplasia) 07/07/2015  . Hyperlipemia 07/07/2015  . Ventral hernia 05/31/2013  . Carpal tunnel syndrome, bilateral 05/07/2011  . Ethanolism ([place])   . COPD (chronic obstructive pulmonary disease) ([place])   . ADENOMATOUS COLONIC POLYP 04/18/2008  . Essential hypertension 04/18/2008  . Coronary atherosclerosis 04/18/2008  . AAA 04/18/2008  . PVD 04/18/2008  . GERD 04/18/2008  . DIVERTICULOSIS, COLON 04/18/2008    ,   P, PTA 04/03/2016, 10:35 AM  [facility] [place], [place], [address] Phone: [phone]   Fax:  [phone]  Name: Brad [name] MRN: [id number] Date of Birth: 1927-10-04  PHYSICAL THERAPY DISCHARGE SUMMARY  Visits from Start of Care: 5.  Current functional level related to goals / functional outcomes: See above.   Remaining deficits: See below.   Education / Equipment:  Plan: Patient agrees to discharge.  Patient goals were not met. Patient is being discharged due to not returning since the last visit.  ?????         Mali Applegate MPT

## 2016-04-08 ENCOUNTER — Emergency Department (HOSPITAL_COMMUNITY): Payer: Medicare Other

## 2016-04-08 ENCOUNTER — Emergency Department (HOSPITAL_COMMUNITY)
Admission: EM | Admit: 2016-04-08 | Discharge: 2016-04-08 | Disposition: A | Discharge status: Home / Self Care | Payer: Medicare Other | Attending: Emergency Medicine | Admitting: Emergency Medicine

## 2016-04-08 ENCOUNTER — Encounter (HOSPITAL_COMMUNITY): Payer: Self-pay

## 2016-04-08 DIAGNOSIS — W01198A Fall on same level from slipping, tripping and stumbling with subsequent striking against other object, initial encounter: Secondary | ICD-10-CM | POA: Insufficient documentation

## 2016-04-08 DIAGNOSIS — S5001XA Contusion of right elbow, initial encounter: Secondary | ICD-10-CM | POA: Diagnosis not present

## 2016-04-08 DIAGNOSIS — S0031XA Abrasion of nose, initial encounter: Secondary | ICD-10-CM | POA: Diagnosis not present

## 2016-04-08 DIAGNOSIS — S3993XA Unspecified injury of pelvis, initial encounter: Secondary | ICD-10-CM | POA: Diagnosis not present

## 2016-04-08 DIAGNOSIS — Z23 Encounter for immunization: Secondary | ICD-10-CM | POA: Insufficient documentation

## 2016-04-08 DIAGNOSIS — Y9301 Activity, walking, marching and hiking: Secondary | ICD-10-CM | POA: Insufficient documentation

## 2016-04-08 DIAGNOSIS — S5002XA Contusion of left elbow, initial encounter: Secondary | ICD-10-CM

## 2016-04-08 DIAGNOSIS — S60222A Contusion of left hand, initial encounter: Secondary | ICD-10-CM | POA: Diagnosis not present

## 2016-04-08 DIAGNOSIS — Z87891 Personal history of nicotine dependence: Secondary | ICD-10-CM | POA: Insufficient documentation

## 2016-04-08 DIAGNOSIS — S199XXA Unspecified injury of neck, initial encounter: Secondary | ICD-10-CM | POA: Diagnosis not present

## 2016-04-08 DIAGNOSIS — I2510 Atherosclerotic heart disease of native coronary artery without angina pectoris: Secondary | ICD-10-CM | POA: Insufficient documentation

## 2016-04-08 DIAGNOSIS — S51012A Laceration without foreign body of left elbow, initial encounter: Secondary | ICD-10-CM

## 2016-04-08 DIAGNOSIS — S0992XA Unspecified injury of nose, initial encounter: Secondary | ICD-10-CM | POA: Diagnosis not present

## 2016-04-08 DIAGNOSIS — Z955 Presence of coronary angioplasty implant and graft: Secondary | ICD-10-CM | POA: Insufficient documentation

## 2016-04-08 DIAGNOSIS — I10 Essential (primary) hypertension: Secondary | ICD-10-CM | POA: Diagnosis not present

## 2016-04-08 DIAGNOSIS — Y92000 Kitchen of unspecified non-institutional (private) residence as  the place of occurrence of the external cause: Secondary | ICD-10-CM | POA: Insufficient documentation

## 2016-04-08 DIAGNOSIS — Y999 Unspecified external cause status: Secondary | ICD-10-CM | POA: Diagnosis not present

## 2016-04-08 DIAGNOSIS — Z950 Presence of cardiac pacemaker: Secondary | ICD-10-CM | POA: Insufficient documentation

## 2016-04-08 DIAGNOSIS — Z7901 Long term (current) use of anticoagulants: Secondary | ICD-10-CM | POA: Diagnosis not present

## 2016-04-08 DIAGNOSIS — M25522 Pain in left elbow: Secondary | ICD-10-CM | POA: Diagnosis not present

## 2016-04-08 DIAGNOSIS — M25422 Effusion, left elbow: Secondary | ICD-10-CM | POA: Diagnosis not present

## 2016-04-08 DIAGNOSIS — S59902A Unspecified injury of left elbow, initial encounter: Secondary | ICD-10-CM | POA: Diagnosis present

## 2016-04-08 DIAGNOSIS — W19XXXA Unspecified fall, initial encounter: Secondary | ICD-10-CM

## 2016-04-08 DIAGNOSIS — J449 Chronic obstructive pulmonary disease, unspecified: Secondary | ICD-10-CM | POA: Insufficient documentation

## 2016-04-08 DIAGNOSIS — S60221A Contusion of right hand, initial encounter: Secondary | ICD-10-CM | POA: Diagnosis not present

## 2016-04-08 DIAGNOSIS — Y92009 Unspecified place in unspecified non-institutional (private) residence as the place of occurrence of the external cause: Secondary | ICD-10-CM

## 2016-04-08 MED ORDER — BACITRACIN ZINC 500 UNIT/GM EX OINT: 1.0000 "application " | TOPICAL_OINTMENT | Freq: Two times a day (BID) | CUTANEOUS | Status: DC

## 2016-04-08 MED ORDER — BACITRACIN ZINC 500 UNIT/GM EX OINT: 1.0000 "application " | TOPICAL_OINTMENT | Freq: Two times a day (BID) | CUTANEOUS | 0 refills | Status: DC

## 2016-04-08 MED ORDER — TETANUS-DIPHTH-ACELL PERTUSSIS 5-2.5-18.5 LF-MCG/0.5 IM SUSP
0.5000 mL | Freq: Once | INTRAMUSCULAR | Status: AC
  Administered 2016-04-08: 0.5 mL via INTRAMUSCULAR
  Filled 2016-04-08: qty 0.5

## 2016-04-08 NOTE — ED Provider Notes (Signed)
[facility] DEPT Provider Note   CSN: [id number] Arrival date & time: 04/08/16  1650     History   Chief Complaint Chief Complaint  Patient presents with  . Fall    HPI Lowel Beston Grigas is a 82 y.o. male.  HPI  The patient is an 82 year old male, he is currently treated for coronary artery disease, ascending aortic aneurysm, COPD, hypertension, hyperlipidemia, peripheral arterial disease as well as atrial fibrillation and a history of sinus node dysfunction requiring pacemaker placement. He currently takes Eliquis as a blood thinner. Reportedly the patient had been walking through his kitchen and onto a carpeted surface but because he shuffles his feet and does not lift them very much he caught his shoe on the edge of the carpet falling forward. He tried to catch himself with his arms but was unable to get them out in front of him and time and fell striking his nose on the ground. He also suffered skin tears or bruising of his bilateral hands and the left elbow. He was able to get up and walk but with some difficulty. He denies any loss of consciousness vomiting or seizures.  Past Medical History:  Diagnosis Date  . Ascending aortic aneurysm (HCC)    Dr. Roxan Hockey  . CAD (coronary artery disease)    s/p stent to OM2 in past;  Last LHC 3/05: EF 60%, left RA occluded, right RA ok, prox to mid LAD 60%, oD1 75-80%, oD2 small 75%, pOM1 50%, OM2 stent ok with 50% before stent, mRCA 30%, dRCA 30%, PDA 70%.  Last dobutamine myoview 5/06: no scar or ischemia, EF 60%.  . Chronic lower back pain   . COPD (chronic obstructive pulmonary disease) ([place])   . DJD (degenerative joint disease)   . Enlarged prostate   . 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 GERD (gastroesophageal reflux disease)   . Hernia, abdominal    "he has ~ 4; wears binder" (11/20/2015)  . HLD (hyperlipidemia)   . HTN (hypertension)    Last echo 1/07:  Normal LVF, LV thickness upper limits of normal, mild aortic root dilatation, mild to  mod MAC, mild LAE, borderline RVH, mild RAE.  . Macular degeneration   . PAD (peripheral artery disease) ([place])   . Paroxysmal atrial fibrillation (HCC)   . Psoriasis   . S/P AAA repair   . Sinus node dysfunction (HCC)    a. s/p MDT dual chamber PPM with His Bundle pacing  . Tobacco abuse   . Unilateral congenital absence of kidney     Patient Active Problem List   Diagnosis Date Noted  . Atrial fibrillation ([place]) 11/21/2015  . Sinus node dysfunction ([place]) 11/20/2015  . Syncope 11/20/2015  . BPH (benign prostatic hyperplasia) 07/07/2015  . Hyperlipemia 07/07/2015  . Ventral hernia 05/31/2013  . Carpal tunnel syndrome, bilateral 05/07/2011  . 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 COPD (chronic obstructive pulmonary disease) ([place])   . ADENOMATOUS COLONIC POLYP 04/18/2008  . Essential hypertension 04/18/2008  . Coronary atherosclerosis 04/18/2008  . AAA 04/18/2008  . PVD 04/18/2008  . GERD 04/18/2008  . DIVERTICULOSIS, COLON 04/18/2008    Past Surgical History:  Procedure Laterality Date  . APPENDECTOMY    . CARPAL TUNNEL RELEASE Right 12/26/2014   Procedure: RIGHT ENDOSCOPIC CARPAL TUNNEL SYNDROME RELEASE;  Surgeon: Milly Jakob, MD;  Location: [facility];  Service: Orthopedics;  Laterality: Right;  . CATARACT EXTRACTION, BILATERAL Bilateral   . CORONARY ANGIOPLASTY WITH STENT PLACEMENT     s/p  stent to OM2 in past;   . EP IMPLANTABLE DEVICE N/A 11/20/2015   Procedure: Pacemaker Implant;  Surgeon: Evans Lance, MD;  Location: [facility];  Service: Cardiovascular;  Laterality: N/A;  . HERNIA REPAIR    . INSERT / REPLACE / REMOVE PACEMAKER  11/20/2015  . RESECTION AND GRAFTING OF INFARENAL  ABDOMINAL  AORTIC ANEURYSM  05/03/2002       Home Medications    Prior to Admission medications   Medication Sig Start Date End Date Taking? Authorizing Provider  acetaminophen (TYLENOL) 650 MG CR tablet Take 1,300 mg by mouth daily.   Yes Historical Provider, MD  albuterol  (PROVENTIL HFA;VENTOLIN HFA) 108 (90 Base) MCG/ACT inhaler Inhale 2 puffs into the lungs every 6 (six) hours as needed for wheezing or shortness of breath. 07/31/15  Yes Fransisca Kaufmann Dettinger, MD  apixaban (ELIQUIS) 5 MG TABS tablet Take 5 mg by mouth 2 (two) times daily.   Yes Historical Provider, MD  celecoxib (CELEBREX) 200 MG capsule TAKE 1 CAPSULE (200 MG TOTAL) BY MOUTH DAILY. WITH FOOD. FOR JOINT PAIN 02/19/16  Yes [name], MD  clobetasol (TEMOVATE) 0.05 % external solution APPLY 2-3 DROPS TO AFFECTED AREAS OF SCALP TWICE A DAY 01/01/16  Yes [name], MD  diltiazem (DILTIAZEM CD) 120 MG 24 hr capsule Take 120 mg by mouth daily.   Yes Historical Provider, MD  fish oil-omega-3 fatty acids 1000 MG capsule Take 1 g by mouth daily.     Yes Historical Provider, MD  furosemide (LASIX) 20 MG tablet Take 2 tablets (40 mg total) by mouth daily. 03/19/16  Yes [name], MD  isosorbide mononitrate (IMDUR) 30 MG 24 hr tablet TAKE 1 TABLET BY MOUTH ONCE A DAY 02/26/16  Yes [name], MD  ketoconazole (NIZORAL) 2 % shampoo Use 2-3 times weekly. Leave on for 5 min. Rinse. 07/07/15  Yes [name], MD  loratadine (CLARITIN) 10 MG tablet TAKE 1 TABLET (10 MG TOTAL) BY MOUTH DAILY. 03/25/16  Yes [name], MD  Melatonin 5 MG TABS Take 5 mg by mouth at bedtime.   Yes Historical Provider, MD  metoprolol tartrate (LOPRESSOR) 25 MG tablet TAKE 1 TABLET (25 MG TOTAL) BY MOUTH 2 (TWO) TIMES DAILY. 03/06/16  Yes [name], MD  Multiple Vitamins-Minerals (PRESERVISION AREDS PO) Take 1 tablet by mouth daily.   Yes Historical Provider, MD  potassium chloride SA (K-DUR,KLOR-CON) 20 MEQ tablet Take 1 tablet (20 mEq total) by mouth daily. 07/07/15  Yes [name], MD  rosuvastatin (CRESTOR) 5 MG tablet Take 5 mg by mouth daily.   Yes Historical Provider, MD  tamsulosin (FLOMAX) 0.4 MG CAPS capsule Take 2 capsules (0.8 mg total) by mouth at bedtime. 07/07/15  Yes [name], MD  triamcinolone cream  (KENALOG) 0.1 % Apply 1 application topically 2 (two) times daily. Apply to AA of trunk, arms, legs. Avoid underarms, genitals, face, groin. 12/16/14  Yes Tiffany A Gann, PA-C  TURMERIC CURCUMIN PO Take 1 tablet by mouth daily.   Yes Historical Provider, MD  acetaminophen (TYLENOL) 500 MG chewable tablet Chew 500 mg by mouth every 6 (six) hours as needed for pain.    Historical Provider, MD  bacitracin ointment Apply 1 application topically 2 (two) times daily. 04/08/16   Noemi Chapel, MD  fluocinolone (VANOS) 0.01 % cream Apply 1 application topically daily as needed (for skin).     Historical Provider, MD  pantoprazole (PROTONIX) 20 MG tablet Take 20 mg by mouth  daily as needed for heartburn or indigestion.     Historical Provider, MD    Family History Family History  Problem Relation Age of Onset  . Cancer Mother     uterine  . Stroke Mother     hemi plagic  . Cancer Sister   . COPD Sister   . COPD Brother   . Cancer Brother     Lip  . Early death Sister     Social History Social History  Substance Use Topics  . Smoking status: Former Smoker    Packs/day: 2.50    Years: 52.00    Types: Cigarettes    Quit date: 05/06/1996  . Smokeless tobacco: Never Used  . Alcohol use Yes     Comment: 11/20/2015 "used to drink heavily; quit 05/06/1996"     Allergies   Aspirin; Atorvastatin; and Cephalexin   Review of Systems Review of Systems  All other systems reviewed and are negative.    Physical Exam Updated Vital Signs BP 165/79 (BP Location: Right Arm)   Pulse 66   Temp 98.1 F (36.7 C) (Oral)   Resp 17   Ht 5\' 4"  (1.626 m)   Wt 194 lb (88 kg)   SpO2 96%   BMI 33.30 kg/m   Physical Exam  Constitutional: He appears well-developed and well-nourished. No distress.  HENT:  Head: Normocephalic.  Mouth/Throat: Oropharynx is clear and moist. No oropharyngeal exudate.  Nasal abrasion - no active bleeding, no ttp over the nasal bones or facial bones and no malocclusion -  no battle's sign and no racoon eyes.  Eyes: Conjunctivae and EOM are normal. Pupils are equal, round, and reactive to light. Right eye exhibits no discharge. Left eye exhibits no discharge. No scleral icterus.  Neck: Normal range of motion. Neck supple. No JVD present. No thyromegaly present.  No ttp over the posterior spinal processes  Cardiovascular: Normal rate, normal heart sounds and intact distal pulses.  Exam reveals no gallop and no friction rub.   No murmur heard. Irregularly irreglar rhythm   Pulmonary/Chest: Effort normal and breath sounds normal. No respiratory distress. He has no wheezes. He has no rales.  Abdominal: Soft. Bowel sounds are normal. He exhibits no distension and no mass. There is no tenderness.  Hernia present - minimall tender (chronic)  Musculoskeletal: Normal range of motion. He exhibits tenderness. He exhibits no edema or deformity.  Has normal ROM of all the major joints of the UE's including the shoulders, elbows, wrists and hands / fingers - he has skin tears to the L elbow, the L hand ove rthe 4th MCP and bruising over the R elbow - he has no pain with rom of the knees or ankles but minimal pain with rOM of the R hip.  Lymphadenopathy:    He has no cervical adenopathy.  Neurological: He is alert. Coordination normal.  Speech is clear, movements are concise, strength is normal in all 4 extremities, normal memory, normal coordination  Skin: Skin is warm and dry. No rash noted. No erythema.  Skin tear to the left elbow, the left hand over the dorsal aspect of the MCP, bruising to the right hand the MCP  Psychiatric: He has a normal mood and affect. His behavior is normal.  Nursing note and vitals reviewed.    ED Treatments / Results  Labs (all labs ordered are listed, but only abnormal results are displayed) Labs Reviewed - No data to display   Radiology Dg Nasal Bones  Result  Date: 04/08/2016 CLINICAL DATA:  Injury from fall EXAM: NASAL BONES - 3+  VIEW COMPARISON:  None. FINDINGS: There is no evidence of fracture or other bone abnormality. IMPRESSION: No acute fracture Electronically Signed   By: Marybelle Killings M.D.   On: 04/08/2016 19:11   Dg Pelvis 1-2 Views  Result Date: 04/08/2016 CLINICAL DATA:  Fall EXAM: PELVIS - 1-2 VIEW COMPARISON:  07/07/2015 FINDINGS: Postsurgical changes of the lower abdomen. Mild SI joint degenerative changes. Pubic symphysis appears intact. No fracture or dislocation is visualized. Mild degenerative changes of the bilateral hips. Stable right pelvic calcifications. Small surgical clips over the right groin area. IMPRESSION: No acute osseous abnormality Electronically Signed   By: Donavan Foil M.D.   On: 04/08/2016 19:18   Dg Elbow Complete Left (3+view)  Result Date: 04/08/2016 CLINICAL DATA:  Injury with pain EXAM: LEFT ELBOW - COMPLETE 3+ VIEW COMPARISON:  None. FINDINGS: No dislocation is evident. No discrete fracture lucency is identified, however there is fat pad distention consistent with elbow effusion. Nonspecific soft tissue calcifications anterior to the distal humerus and antecubital fossa. IMPRESSION: 1. No definitive fracture lucency is visualized. 2. There is fat pad distention consistent with elbow effusion. Radiographic follow-up in 7-10 days is recommended to exclude occult elbow fracture. Electronically Signed   By: Donavan Foil M.D.   On: 04/08/2016 19:16   Ct Cervical Spine Wo Contrast  Result Date: 04/08/2016 CLINICAL DATA:  Fall. EXAM: CT CERVICAL SPINE WITHOUT CONTRAST TECHNIQUE: Multidetector CT imaging of the cervical spine was performed without intravenous contrast. Multiplanar CT image reconstructions were also generated. COMPARISON:  CT scan of chest of April 11, 2015 and April 12, 2014. FINDINGS: Alignment: No spondylolisthesis is noted. Skull base and vertebrae: No definite fracture is noted. Soft tissues and spinal canal: Large lipoma is seen in right posterior portion of the  neck. Disc levels: Moderate degenerative disc disease is noted at C4-5, C5-6, C6-7 and C7-T1. Upper chest: Visualized lung apices are unremarkable. Other: Degenerative changes seen involving posterior facet joints bilaterally. IMPRESSION: Multilevel degenerative disc disease. No fracture or significant spondylolisthesis is noted. Large lipoma seen in the right posterior soft tissues of the neck. Electronically Signed   By: Marijo Conception, M.D.   On: 04/08/2016 20:03   Dg Hand Complete Left  Result Date: 04/08/2016 CLINICAL DATA:  Abrasion with pain EXAM: LEFT HAND - COMPLETE 3+ VIEW COMPARISON:  None. FINDINGS: No acute displaced fracture or subluxation. No radiopaque foreign body. Narrowing and osteophyte at all PIP and DIP joints consistent with arthritis. Moderate degenerative changes at the first [facility] joint. Mild to moderate intercarpal degenerative changes. IMPRESSION: No acute osseous abnormality Electronically Signed   By: Donavan Foil M.D.   On: 04/08/2016 19:14   Dg Hand Complete Right  Result Date: 04/08/2016 CLINICAL DATA:  Fall with bruising EXAM: RIGHT HAND - COMPLETE 3+ VIEW COMPARISON:  02/06/2016 FINDINGS: No acute displaced fracture or subluxation is evident. Moderate first CMC degenerative changes. Mild narrowing and osteophyte of the PIP and DIP joints. Tiny opacity adjacent to the ulnar styloid unchanged, could relate to old fracture. Small calcific opacities along the volar surface of the distal radius are unchanged and could represent old fracture or nonspecific soft tissue calcification. IMPRESSION: 1. No acute osseous abnormality 2. Multi side osteoarthritic changes as previously described. Electronically Signed   By: Donavan Foil M.D.   On: 04/08/2016 19:13    Procedures Procedures (including critical care time)  Medications Ordered in ED Medications  bacitracin ointment 1 application (not administered)  Tdap (BOOSTRIX) injection 0.5 mL (0.5 mLs Intramuscular Given  04/08/16 1949)     Initial Impression / Assessment and Plan / ED Course  I have reviewed the triage vital signs and the nursing notes.  Pertinent labs & imaging results that were available during my care of the patient were reviewed by me and considered in my medical decision making (see chart for details).  Clinical Course     Mechanical fall, evaluate for potential traumatic injuries, update tetanus, skin tears appear very superficial, no signs of lacerations that would need repair. Will further investigate the nasal bones as well as the bilateral hands and the pelvis and a CT scan of the neck as the patient is old and the mechanism suggests a hyperextension injury.  xrays shared with family - needs a repeat xray in 10 days to r/o elbow frx - but on repeat exam has no dec ROM and no ttp with ROm and no ttp ove rthe olectranon, or the epcondyles or the radial head.  familiy expressed understanding  Wound care given. Bacitracin Sterile dressings  Final Clinical Impressions(s) / ED Diagnoses   Final diagnoses:  Fall in home, initial encounter  Contusion of left elbow, initial encounter  Skin tear of left elbow without complication, initial encounter    New Prescriptions New Prescriptions   BACITRACIN OINTMENT    Apply 1 application topically 2 (two) times daily.     Noemi Chapel, MD 04/08/16 2040

## 2016-04-08 NOTE — ED Notes (Signed)
Pt to xray

## 2016-04-08 NOTE — Discharge Instructions (Signed)
Please keep your wounds covered in antibioitc cream and a sterile dressing Your skin tears will not need stitches because they are paper thin and will not hold - they will take a couple of weeks to a month to heal up - your xrays show no other problems including no obvious fractures Tylenol for pain, ice and elevate your arm / hands.

## 2016-04-08 NOTE — ED Triage Notes (Signed)
Pt presents for evaluation of injuries related to mechanical fall today. Pt. Reports no LOC, states tripped on carpet. States hit nose on carpet. Pt. Has small abrasion to L hand and elbow. Pt. Is on elliquis for afib, has paced rhythm.

## 2016-04-16 ENCOUNTER — Ambulatory Visit: Payer: Medicare Other | Admitting: Thoracic Surgery (Cardiothoracic Vascular Surgery)

## 2016-04-16 ENCOUNTER — Other Ambulatory Visit: Payer: Medicare Other

## 2016-04-17 ENCOUNTER — Ambulatory Visit (INDEPENDENT_AMBULATORY_CARE_PROVIDER_SITE_OTHER): Payer: Medicare Other | Admitting: Family Medicine

## 2016-04-17 ENCOUNTER — Encounter: Payer: Self-pay | Admitting: Family Medicine

## 2016-04-17 ENCOUNTER — Ambulatory Visit (INDEPENDENT_AMBULATORY_CARE_PROVIDER_SITE_OTHER): Payer: Medicare Other

## 2016-04-17 VITALS — BP 131/80 | HR 85 | Temp 96.8°F | Ht 64.0 in | Wt 189.0 lb

## 2016-04-17 DIAGNOSIS — M25522 Pain in left elbow: Secondary | ICD-10-CM

## 2016-04-17 DIAGNOSIS — I2584 Coronary atherosclerosis due to calcified coronary lesion: Secondary | ICD-10-CM

## 2016-04-17 DIAGNOSIS — I2510 Atherosclerotic heart disease of native coronary artery without angina pectoris: Secondary | ICD-10-CM | POA: Diagnosis not present

## 2016-04-17 NOTE — Progress Notes (Signed)
Subjective:  Patient ID: Brad [name], male    DOB: 12-15-1927  Age: 82 y.o. MRN: [id number]  CC: Hospitalization Follow-up (Recheck Left elbow from fall)   HPI Brad [name] presents for Concern that a hairline fracture of the left elbow may still be present based on emergency room physicians report. In today for repeat x-ray. There is very minimal pain. There is full range of motion. Patient did not lose consciousness when he fell. He just lost his balance. He's done that 3 times in the last several months. He refuses to use his walker at home. His son is with him and states he does have a walker and encouraged him to use it.   History Brad [name] has a past medical history of Ascending aortic aneurysm ([place]); CAD (coronary artery disease); Chronic lower back pain; COPD (chronic obstructive pulmonary disease) (HCC); DJD (degenerative joint disease); Enlarged prostate; Ethanolism ([place]); GERD (gastroesophageal reflux disease); Hernia, abdominal; HLD (hyperlipidemia); HTN (hypertension); Macular degeneration; PAD (peripheral artery disease) ([place]); Paroxysmal atrial fibrillation ([place]); Psoriasis; S/P AAA repair; Sinus node dysfunction ([place]); Tobacco abuse; and Unilateral congenital absence of kidney.   He has a past surgical history that includes Appendectomy; RESECTION AND GRAFTING OF INFARENAL  ABDOMINAL  AORTIC ANEURYSM (05/03/2002); Carpal tunnel release (Right, 12/26/2014); Insert / replace / remove pacemaker (11/20/2015); Hernia repair; Cataract extraction, bilateral (Bilateral); Coronary angioplasty with stent; and Cardiac catheterization (N/A, 11/20/2015).   His family history includes COPD in his brother and sister; Cancer in his brother, mother, and sister; Early death in his sister; Stroke in his mother.He reports that he quit smoking about 19 years ago. His smoking use included Cigarettes. He has a 130.00 pack-year smoking history. He has never used smokeless tobacco. He reports that he drinks  alcohol. He reports that he does not use drugs.    ROS Review of Systems Unremarkable except as per history of present illness Objective:  BP 131/80   Pulse 85   Temp (!) 96.8 F (36 C) (Oral)   Ht 5\' 4"  (1.626 m)   Wt 189 lb (85.7 kg)   BMI 32.44 kg/m   BP Readings from Last 3 Encounters:  04/17/16 131/80  04/08/16 165/79  03/06/16 106/82    Wt Readings from Last 3 Encounters:  04/17/16 189 lb (85.7 kg)  04/08/16 194 lb (88 kg)  03/06/16 192 lb (87.1 kg)     Physical Exam  Constitutional: He is oriented to person, place, and time. He appears well-developed and well-nourished.  Musculoskeletal: Normal range of motion. He exhibits tenderness (Mild at left olecranon).  Neurological: He is alert and oriented to person, place, and time. No cranial nerve deficit. He exhibits abnormal muscle tone (overall diminished symmetrically).     Lab Results  Component Value Date   WBC 6.7 02/05/2016   HGB 14.0 02/05/2016   HCT 41.7 02/05/2016   PLT 168 02/05/2016   GLUCOSE 111 (H) 02/05/2016   CHOL 146 07/08/2015   TRIG 49 07/08/2015   HDL 53 07/08/2015   LDLCALC 83 07/08/2015   ALT 12 07/08/2015   AST 15 07/08/2015   NA 140 02/05/2016   K 4.3 02/05/2016   CL 104 02/05/2016   CREATININE 0.70 02/05/2016   BUN 14 02/05/2016   CO2 27 02/05/2016   TSH 0.49 05/02/2014    Dg Nasal Bones  Result Date: 04/08/2016 CLINICAL DATA:  Injury from fall EXAM: NASAL BONES - 3+ VIEW COMPARISON:  None. FINDINGS: There is no evidence of fracture or  other bone abnormality. IMPRESSION: No acute fracture Electronically Signed   By: Marybelle Killings M.D.   On: 04/08/2016 19:11   Dg Pelvis 1-2 Views  Result Date: 04/08/2016 CLINICAL DATA:  Fall EXAM: PELVIS - 1-2 VIEW COMPARISON:  07/07/2015 FINDINGS: Postsurgical changes of the lower abdomen. Mild SI joint degenerative changes. Pubic symphysis appears intact. No fracture or dislocation is visualized. Mild degenerative changes of the bilateral  hips. Stable right pelvic calcifications. Small surgical clips over the right groin area. IMPRESSION: No acute osseous abnormality Electronically Signed   By: Donavan Foil M.D.   On: 04/08/2016 19:18   Dg Elbow Complete Left (3+view)  Result Date: 04/08/2016 CLINICAL DATA:  Injury with pain EXAM: LEFT ELBOW - COMPLETE 3+ VIEW COMPARISON:  None. FINDINGS: No dislocation is evident. No discrete fracture lucency is identified, however there is fat pad distention consistent with elbow effusion. Nonspecific soft tissue calcifications anterior to the distal humerus and antecubital fossa. IMPRESSION: 1. No definitive fracture lucency is visualized. 2. There is fat pad distention consistent with elbow effusion. Radiographic follow-up in 7-10 days is recommended to exclude occult elbow fracture. Electronically Signed   By: Donavan Foil M.D.   On: 04/08/2016 19:16   Ct Cervical Spine Wo Contrast  Result Date: 04/08/2016 CLINICAL DATA:  Fall. EXAM: CT CERVICAL SPINE WITHOUT CONTRAST TECHNIQUE: Multidetector CT imaging of the cervical spine was performed without intravenous contrast. Multiplanar CT image reconstructions were also generated. COMPARISON:  CT scan of chest of April 11, 2015 and April 12, 2014. FINDINGS: Alignment: No spondylolisthesis is noted. Skull base and vertebrae: No definite fracture is noted. Soft tissues and spinal canal: Large lipoma is seen in right posterior portion of the neck. Disc levels: Moderate degenerative disc disease is noted at C4-5, C5-6, C6-7 and C7-T1. Upper chest: Visualized lung apices are unremarkable. Other: Degenerative changes seen involving posterior facet joints bilaterally. IMPRESSION: Multilevel degenerative disc disease. No fracture or significant spondylolisthesis is noted. Large lipoma seen in the right posterior soft tissues of the neck. Electronically Signed   By: Marijo Conception, M.D.   On: 04/08/2016 20:03   Dg Hand Complete Left  Result Date:  04/08/2016 CLINICAL DATA:  Abrasion with pain EXAM: LEFT HAND - COMPLETE 3+ VIEW COMPARISON:  None. FINDINGS: No acute displaced fracture or subluxation. No radiopaque foreign body. Narrowing and osteophyte at all PIP and DIP joints consistent with arthritis. Moderate degenerative changes at the first [facility] joint. Mild to moderate intercarpal degenerative changes. IMPRESSION: No acute osseous abnormality Electronically Signed   By: Donavan Foil M.D.   On: 04/08/2016 19:14   Dg Hand Complete Right  Result Date: 04/08/2016 CLINICAL DATA:  Fall with bruising EXAM: RIGHT HAND - COMPLETE 3+ VIEW COMPARISON:  02/06/2016 FINDINGS: No acute displaced fracture or subluxation is evident. Moderate first CMC degenerative changes. Mild narrowing and osteophyte of the PIP and DIP joints. Tiny opacity adjacent to the ulnar styloid unchanged, could relate to old fracture. Small calcific opacities along the volar surface of the distal radius are unchanged and could represent old fracture or nonspecific soft tissue calcification. IMPRESSION: 1. No acute osseous abnormality 2. Multi side osteoarthritic changes as previously described. Electronically Signed   By: Donavan Foil M.D.   On: 04/08/2016 19:13    Assessment & Plan:   Josejuan was seen today for hospitalization follow-up.  Diagnoses and all orders for this visit:  Left elbow pain -     DG Elbow 2 Views Left; Future  X-ray reports above reviewed. Repeat of the left elbow negative for fracture.  I have discontinued Mr. Reichelt's diltiazem. I am also having him maintain his fish oil-omega-3 fatty acids, TURMERIC CURCUMIN PO, Multiple Vitamins-Minerals (PRESERVISION AREDS PO), triamcinolone cream, fluocinolone, ketoconazole, potassium chloride SA, tamsulosin, albuterol, pantoprazole, acetaminophen, clobetasol, celecoxib, Melatonin, isosorbide mononitrate, apixaban, metoprolol tartrate, furosemide, loratadine, acetaminophen, rosuvastatin, and bacitracin.  No  orders of the defined types were placed in this encounter.    Follow-up: Return if symptoms worsen or fail to improve and as previously recommended for usual checkup.[name], M.D.

## 2016-04-23 ENCOUNTER — Ambulatory Visit (INDEPENDENT_AMBULATORY_CARE_PROVIDER_SITE_OTHER): Payer: Medicare Other | Admitting: Thoracic Surgery (Cardiothoracic Vascular Surgery)

## 2016-04-23 ENCOUNTER — Encounter: Payer: Self-pay | Admitting: Thoracic Surgery (Cardiothoracic Vascular Surgery)

## 2016-04-23 ENCOUNTER — Ambulatory Visit
Admission: RE | Admit: 2016-04-23 | Discharge: 2016-04-23 | Disposition: A | Discharge status: Home / Self Care | Payer: Medicare Other | Source: Ambulatory Visit | Attending: Thoracic Surgery (Cardiothoracic Vascular Surgery) | Admitting: Thoracic Surgery (Cardiothoracic Vascular Surgery)

## 2016-04-23 VITALS — BP 156/83 | Resp 16 | Ht 64.0 in | Wt 189.0 lb

## 2016-04-23 DIAGNOSIS — I2584 Coronary atherosclerosis due to calcified coronary lesion: Secondary | ICD-10-CM

## 2016-04-23 DIAGNOSIS — I712 Thoracic aortic aneurysm, without rupture: Secondary | ICD-10-CM

## 2016-04-23 DIAGNOSIS — I7121 Aneurysm of the ascending aorta, without rupture: Secondary | ICD-10-CM

## 2016-04-23 DIAGNOSIS — I2510 Atherosclerotic heart disease of native coronary artery without angina pectoris: Secondary | ICD-10-CM | POA: Diagnosis not present

## 2016-04-23 MED ORDER — IOPAMIDOL (ISOVUE-370) INJECTION 76%
75.0000 mL | Freq: Once | INTRAVENOUS | Status: AC | PRN
  Administered 2016-04-23: 75 mL via INTRAVENOUS

## 2016-04-23 NOTE — Progress Notes (Signed)
[address]       [place] [address]             [phone]    HPI: Mr. Tulk returns today for scheduled 1 year follow-up visit  He is an 82 year old man who had been following day back to at least 2003. He had a 4.6 cm ascending aneurysm at that time. I last saw him in the office a year ago. The aneurysm was possibly slightly larger measured as much is 5.1 on the axial images although this was clearly not a true cross-sectional diameter which appeared to be much closer to 4.6 cm.  In the interim since his last visit he's been having issues with falls. He was diagnosed with atrial fibrillation and had a pacemaker placed. He is on apixaban. He is walking with a cane now.  Past Medical History:  Diagnosis Date  . Ascending aortic aneurysm (HCC)    Dr. Roxan Hockey  . CAD (coronary artery disease)    s/p stent to OM2 in past;  Last LHC 3/05: EF 60%, left RA occluded, right RA ok, prox to mid LAD 60%, oD1 75-80%, oD2 small 75%, pOM1 50%, OM2 stent ok with 50% before stent, mRCA 30%, dRCA 30%, PDA 70%.  Last dobutamine myoview 5/06: no scar or ischemia, EF 60%.  . Chronic lower back pain   . COPD (chronic obstructive pulmonary disease) ([place])   . DJD (degenerative joint disease)   . Enlarged prostate   . Ethanolism ([place])   . GERD (gastroesophageal reflux disease)   . Hernia, abdominal    "he has ~ 4; wears binder" (11/20/2015)  . HLD (hyperlipidemia)   . HTN (hypertension)    Last echo 1/07:  Normal LVF, LV thickness upper limits of normal, mild aortic root dilatation, mild to mod MAC, mild LAE, borderline RVH, mild RAE.  . Macular degeneration   . PAD (peripheral artery disease) ([place])   . Paroxysmal atrial fibrillation (HCC)   . Psoriasis   . S/P AAA repair   . Sinus node dysfunction (HCC)    a. s/p MDT dual chamber PPM with His Bundle pacing  . Tobacco abuse   . Unilateral congenital absence of kidney      Current Outpatient Prescriptions  Medication  Sig Dispense Refill  . acetaminophen (TYLENOL) 500 MG chewable tablet Chew 500 mg by mouth every 6 (six) hours as needed for pain.    Marland Kitchen albuterol (PROVENTIL HFA;VENTOLIN HFA) 108 (90 Base) MCG/ACT inhaler Inhale 2 puffs into the lungs every 6 (six) hours as needed for wheezing or shortness of breath. 1 Inhaler 2  . apixaban (ELIQUIS) 5 MG TABS tablet Take 5 mg by mouth 2 (two) times daily.    . bacitracin ointment Apply 1 application topically 2 (two) times daily. 120 g 0  . celecoxib (CELEBREX) 200 MG capsule TAKE 1 CAPSULE (200 MG TOTAL) BY MOUTH DAILY. WITH FOOD. FOR JOINT PAIN 30 capsule 2  . clobetasol (TEMOVATE) 0.05 % external solution APPLY 2-3 DROPS TO AFFECTED AREAS OF SCALP TWICE A DAY 50 mL 2  . fish oil-omega-3 fatty acids 1000 MG capsule Take 1 g by mouth daily.      . fluocinolone (VANOS) 0.01 % cream Apply 1 application topically daily as needed (for skin).     . furosemide (LASIX) 20 MG tablet Take 2 tablets (40 mg total) by mouth daily. 60 tablet 11  . isosorbide mononitrate (IMDUR) 30 MG 24 hr tablet TAKE  1 TABLET BY MOUTH ONCE A DAY 30 tablet 2  . ketoconazole (NIZORAL) 2 % shampoo Use 2-3 times weekly. Leave on for 5 min. Rinse. 120 mL 0  . loratadine (CLARITIN) 10 MG tablet TAKE 1 TABLET (10 MG TOTAL) BY MOUTH DAILY. 30 tablet 5  . Melatonin 5 MG TABS Take 5 mg by mouth at bedtime.    . metoprolol tartrate (LOPRESSOR) 25 MG tablet TAKE 1 TABLET (25 MG TOTAL) BY MOUTH 2 (TWO) TIMES DAILY. 60 tablet 5  . Multiple Vitamins-Minerals (PRESERVISION AREDS PO) Take 1 tablet by mouth daily.    . pantoprazole (PROTONIX) 20 MG tablet Take 20 mg by mouth daily as needed for heartburn or indigestion.     . potassium chloride SA (K-DUR,KLOR-CON) 20 MEQ tablet Take 1 tablet (20 mEq total) by mouth daily. 90 tablet 3  . rosuvastatin (CRESTOR) 5 MG tablet Take 5 mg by mouth daily.    . tamsulosin (FLOMAX) 0.4 MG CAPS capsule Take 2 capsules (0.8 mg total) by mouth at bedtime. 180 capsule 3    . triamcinolone cream (KENALOG) 0.1 % Apply 1 application topically 2 (two) times daily. Apply to AA of trunk, arms, legs. Avoid underarms, genitals, face, groin. 453.6 g 3  . TURMERIC CURCUMIN PO Take 1 tablet by mouth daily.     No current facility-administered medications for this visit.     Physical Exam BP (!) 156/83 (BP Location: Left Arm, Patient Position: Sitting, Cuff Size: Large)   Resp 16   Ht 5\' 4"  (1.626 m)   Wt 189 lb (85.7 kg)   SpO2 97% Comment: ON RA  BMI 32.10 kg/m  82 year old man in no acute distress Alert and oriented 3 with no focal deficits Gait unsteady, uses cane Lungs diminished but otherwise clear bilaterally Cardiac regular rate and rhythm with 2/6 systolic murmur  Diagnostic Tests: CT ANGIOGRAPHY CHEST WITH CONTRAST  TECHNIQUE: Multidetector CT imaging of the chest was performed using the standard protocol during bolus administration of intravenous contrast. Multiplanar CT image reconstructions and MIPs were obtained to evaluate the vascular anatomy.  CONTRAST:  75 mL of Isovue 370 intravenously.  COMPARISON:  CT scan of April 11, 2015.  FINDINGS: Cardiovascular: Atherosclerosis of thoracic aorta is noted without dissection. Stable 5.1 cm ascending thoracic aortic aneurysm is noted. Transverse aortic arch measures 3.4 cm in diameter. Descending thoracic aorta measures 3.1 cm in diameter. Coronary artery calcifications are noted suggesting coronary artery disease. Great vessels are widely patent. Stable small penetrating atherosclerotic ulcer is seen involving transverse aortic arch.  Mediastinum/Nodes: No enlarged mediastinal, hilar, or axillary lymph nodes. Thyroid gland, trachea, and esophagus demonstrate no significant findings.  Lungs/Pleura: No pneumothorax or pleural effusion is noted. Mild emphysematous disease is noted in the upper lobes bilaterally. Stable mild biapical scarring is noted. No acute pulmonary disease is  noted.  Upper Abdomen: No acute abnormality.  Musculoskeletal: Severe multilevel degenerative disc disease is noted in the thoracic spine.  Review of the MIP images confirms the above findings.  IMPRESSION: Aortic atherosclerosis.  Stable 5.1 cm ascending thoracic aortic aneurysm is noted. Ascending thoracic aortic aneurysm. Recommend semi-annual imaging followup by CTA or MRA and referral to cardiothoracic surgery if not already obtained. This recommendation follows 2010 ACCF/AHA/AATS/ACR/ASA/SCA/SCAI/SIR/STS/SVM Guidelines for the Diagnosis and Management of Patients With Thoracic Aortic Disease. Circulation. 2010; [id number].  Stable small penetrating atherosclerotic ulcer involving transverse aortic arch.  Mild emphysematous changes noted in the upper lobes bilaterally.   Electronically Signed   By:  Marijo Conception, M.D.   On: 04/23/2016 14:35 I personally reviewed the CT chest. The aneurysm is unchanged from a year ago.  Impression: Mr. Salber is an 82 year old gentleman with an ascending aortic aneurysm. He has multiple other health issues with most recent being in need for a pacemaker placement. He continues to have near falls even after having a pacemaker placed. In my opinion he would not be a candidate for ascending aortic aneurysm repair. Therefore, I don't think there is any need for ongoing surveillance.  I discussed these issues with Mr. Hoverson and his son. They understand and are in agreement to discontinue CT scans.  Hypertension- his blood pressure was elevated today. It was normal at a recent visit with his primary care. I recommended he follow up with them regarding that issue.  Plan: No need for further CT follow-up of the ascending aneurysm.  Melrose Nakayama, MD [facility] [phone]

## 2016-05-03 ENCOUNTER — Other Ambulatory Visit: Payer: Self-pay | Admitting: Family Medicine

## 2016-05-19 ENCOUNTER — Other Ambulatory Visit: Payer: Self-pay | Admitting: Family Medicine

## 2016-05-21 ENCOUNTER — Telehealth: Payer: Self-pay | Admitting: Family Medicine

## 2016-05-22 NOTE — Telephone Encounter (Signed)
Aware, will need lab work.

## 2016-05-24 ENCOUNTER — Ambulatory Visit: Payer: Medicare Other | Admitting: Family Medicine

## 2016-05-27 ENCOUNTER — Other Ambulatory Visit: Payer: Self-pay | Admitting: Family Medicine

## 2016-05-27 DIAGNOSIS — R3989 Other symptoms and signs involving the genitourinary system: Secondary | ICD-10-CM

## 2016-06-03 ENCOUNTER — Encounter: Payer: Self-pay | Admitting: Family Medicine

## 2016-06-03 ENCOUNTER — Ambulatory Visit (INDEPENDENT_AMBULATORY_CARE_PROVIDER_SITE_OTHER): Payer: Medicare Other | Admitting: Family Medicine

## 2016-06-03 ENCOUNTER — Encounter (INDEPENDENT_AMBULATORY_CARE_PROVIDER_SITE_OTHER): Payer: Self-pay

## 2016-06-03 VITALS — BP 131/72 | HR 66 | Temp 97.0°F | Ht 64.0 in | Wt 188.0 lb

## 2016-06-03 DIAGNOSIS — I10 Essential (primary) hypertension: Secondary | ICD-10-CM

## 2016-06-03 DIAGNOSIS — R3989 Other symptoms and signs involving the genitourinary system: Secondary | ICD-10-CM | POA: Diagnosis not present

## 2016-06-03 DIAGNOSIS — E782 Mixed hyperlipidemia: Secondary | ICD-10-CM | POA: Diagnosis not present

## 2016-06-03 DIAGNOSIS — R42 Dizziness and giddiness: Secondary | ICD-10-CM

## 2016-06-03 DIAGNOSIS — N400 Enlarged prostate without lower urinary tract symptoms: Secondary | ICD-10-CM | POA: Diagnosis not present

## 2016-06-03 DIAGNOSIS — I25118 Atherosclerotic heart disease of native coronary artery with other forms of angina pectoris: Secondary | ICD-10-CM | POA: Diagnosis not present

## 2016-06-03 DIAGNOSIS — J410 Simple chronic bronchitis: Secondary | ICD-10-CM

## 2016-06-03 DIAGNOSIS — I480 Paroxysmal atrial fibrillation: Secondary | ICD-10-CM | POA: Diagnosis not present

## 2016-06-03 DIAGNOSIS — S51812A Laceration without foreign body of left forearm, initial encounter: Secondary | ICD-10-CM | POA: Diagnosis not present

## 2016-06-03 MED ORDER — TAMSULOSIN HCL 0.4 MG PO CAPS: 0.4000 mg | ORAL_CAPSULE | Freq: Every day | ORAL | 0 refills | Status: DC

## 2016-06-03 NOTE — Progress Notes (Signed)
Subjective:  Patient ID: Brad [name], male    DOB: 30-Nov-1927  Age: 83 y.o. MRN: [id number]  CC: Hyperlipidemia (pt here today for routine follow up for cholesterol and then this morning he had a dizzy episode and fell when he got out of the bed and hit his arm )   HPI Brad [name] presents for Patient in for follow-up of elevated cholesterol. Doing well without complaints on current medication. Denies side effects of statin including myalgia and arthralgia and nausea. Also in today for liver function testing. Currently no chest pain, shortness of breath or other cardiovascular related symptoms noted.   Patient in for follow-up of atrial fibrillation. Patient denies any recent bouts of chest pain or palpitations. Additionally, patient is taking anticoagulants. Patient denies any recent excessive bleeding episodes including epistaxis, bleeding from the gums, genitalia, rectal bleeding or hematuria. Additionally there has been no excessive bruising.   follow-up of hypertension. Patient has no history of headache chest pain or shortness of breath or recent cough. Patient also denies symptoms of TIA such as numbness weakness lateralizing. Patient checks  blood pressure at home and has not had any elevated readings recently. Patient denies side effects from his medication. States taking it regularly.  Fell at 1:00 this morning. He did not pass out. However he did say the room started spinning as he stood up and he fell over. He did not pass out. He says he remembers thinking he needed to avoid hitting the fireplace and was able to shift his weight to do so. He does not use his walker regularly. Currently he sleeps in his living room then in order to stay near the fireplace to avoid the cold. He was sleeping on the couch and his son who is with him says he hasn't except nice and comfortable. However the son says that the walker stays folded up over in a corner.  History Brad [name] has a past medical  history of Ascending aortic aneurysm ([place]); CAD (coronary artery disease); Chronic lower back pain; COPD (chronic obstructive pulmonary disease) (HCC); DJD (degenerative joint disease); Enlarged prostate; Ethanolism ([place]); GERD (gastroesophageal reflux disease); Hernia, abdominal; HLD (hyperlipidemia); HTN (hypertension); Macular degeneration; PAD (peripheral artery disease) ([place]); Paroxysmal atrial fibrillation ([place]); Psoriasis; S/P AAA repair; Sinus node dysfunction ([place]); Tobacco abuse; and Unilateral congenital absence of kidney.   He has a past surgical history that includes Appendectomy; RESECTION AND GRAFTING OF INFARENAL  ABDOMINAL  AORTIC ANEURYSM (05/03/2002); Carpal tunnel release (Right, 12/26/2014); Insert / replace / remove pacemaker (11/20/2015); Hernia repair; Cataract extraction, bilateral (Bilateral); Coronary angioplasty with stent; and Cardiac catheterization (N/A, 11/20/2015).   His family history includes COPD in his brother and sister; Cancer in his brother, mother, and sister; Early death in his sister; Stroke in his mother.He reports that he quit smoking about 20 years ago. His smoking use included Cigarettes. He has a 130.00 pack-year smoking history. He has never used smokeless tobacco. He reports that he drinks alcohol. He reports that he does not use drugs.    ROS Review of Systems  Constitutional: Negative for chills, diaphoresis, fever and unexpected weight change.  HENT: Negative for congestion, hearing loss, rhinorrhea and sore throat.   Eyes: Negative for visual disturbance.  Respiratory: Negative for cough and shortness of breath.   Cardiovascular: Negative for chest pain.  Gastrointestinal: Negative for abdominal pain, constipation and diarrhea.  Genitourinary: Negative for dysuria and flank pain.  Musculoskeletal: Negative for arthralgias and joint swelling.  Skin: Negative for  rash.  Neurological: Positive for dizziness. Negative for syncope and headaches.    Psychiatric/Behavioral: Negative for dysphoric mood and sleep disturbance.    Objective:  BP 131/72   Pulse 66   Temp 97 F (36.1 C) (Oral)   Ht 5' 4"  (1.626 m)   Wt 188 lb (85.3 kg)   BMI 32.27 kg/m   BP Readings from Last 3 Encounters:  06/03/16 131/72  04/23/16 (!) 156/83  04/17/16 131/80    Wt Readings from Last 3 Encounters:  06/03/16 188 lb (85.3 kg)  04/23/16 189 lb (85.7 kg)  04/17/16 189 lb (85.7 kg)     Physical Exam  Constitutional: He is oriented to person, place, and time. He appears well-developed and well-nourished. No distress.  HENT:  Head: Normocephalic and atraumatic.  Right Ear: External ear normal.  Left Ear: External ear normal.  Nose: Nose normal.  Mouth/Throat: Oropharynx is clear and moist.  Eyes: Conjunctivae and EOM are normal. Pupils are equal, round, and reactive to light.  Neck: Normal range of motion. Neck supple. No thyromegaly present.  Cardiovascular: Normal rate, regular rhythm and normal heart sounds.   No murmur heard. Pulmonary/Chest: Effort normal and breath sounds normal. No respiratory distress. He has no wheezes. He has no rales.  Abdominal: Soft. Bowel sounds are normal. He exhibits no distension. There is no tenderness.  Lymphadenopathy:    He has no cervical adenopathy.  Neurological: He is alert and oriented to person, place, and time. He has normal reflexes.  Skin: Skin is warm and dry.  Psychiatric: He has a normal mood and affect. His behavior is normal. Judgment and thought content normal.    Ct Angio Chest Aorta W/cm &/or Wo/cm  Result Date: 04/23/2016 CLINICAL DATA:  Ascending thoracic aortic aneurysm. EXAM: CT ANGIOGRAPHY CHEST WITH CONTRAST TECHNIQUE: Multidetector CT imaging of the chest was performed using the standard protocol during bolus administration of intravenous contrast. Multiplanar CT image reconstructions and MIPs were obtained to evaluate the vascular anatomy. CONTRAST:  75 mL of Isovue 370  intravenously. COMPARISON:  CT scan of April 11, 2015. FINDINGS: Cardiovascular: Atherosclerosis of thoracic aorta is noted without dissection. Stable 5.1 cm ascending thoracic aortic aneurysm is noted. Transverse aortic arch measures 3.4 cm in diameter. Descending thoracic aorta measures 3.1 cm in diameter. Coronary artery calcifications are noted suggesting coronary artery disease. Great vessels are widely patent. Stable small penetrating atherosclerotic ulcer is seen involving transverse aortic arch. Mediastinum/Nodes: No enlarged mediastinal, hilar, or axillary lymph nodes. Thyroid gland, trachea, and esophagus demonstrate no significant findings. Lungs/Pleura: No pneumothorax or pleural effusion is noted. Mild emphysematous disease is noted in the upper lobes bilaterally. Stable mild biapical scarring is noted. No acute pulmonary disease is noted. Upper Abdomen: No acute abnormality. Musculoskeletal: Severe multilevel degenerative disc disease is noted in the thoracic spine. Review of the MIP images confirms the above findings. IMPRESSION: Aortic atherosclerosis. Stable 5.1 cm ascending thoracic aortic aneurysm is noted. Ascending thoracic aortic aneurysm. Recommend semi-annual imaging followup by CTA or MRA and referral to cardiothoracic surgery if not already obtained. This recommendation follows 2010 ACCF/AHA/AATS/ACR/ASA/SCA/SCAI/SIR/STS/SVM Guidelines for the Diagnosis and Management of Patients With Thoracic Aortic Disease. Circulation. 2010; 121: S496-P591. Stable small penetrating atherosclerotic ulcer involving transverse aortic arch. Mild emphysematous changes noted in the upper lobes bilaterally. Electronically Signed   By: Marijo Conception, M.D.   On: 04/23/2016 14:35    Assessment & Plan:   Stace was seen today for hyperlipidemia.  Diagnoses and all orders for  this visit:  Essential hypertension -     CBC with Differential/Platelet -     CMP14+EGFR  Urine troubles -     tamsulosin  (FLOMAX) 0.4 MG CAPS capsule; Take 1 capsule (0.4 mg total) by mouth at bedtime. -     CBC with Differential/Platelet -     CMP14+EGFR  Atherosclerosis of native coronary artery of native heart with other form of angina pectoris (HCC) -     CBC with Differential/Platelet -     CMP14+EGFR  Simple chronic bronchitis (HCC) -     CBC with Differential/Platelet -     CMP14+EGFR  Benign prostatic hyperplasia without lower urinary tract symptoms -     CBC with Differential/Platelet -     CMP14+EGFR  Mixed hyperlipidemia -     CBC with Differential/Platelet -     CMP14+EGFR -     Lipid panel  Skin tear of left forearm without complication, initial encounter -     CBC with Differential/Platelet -     CMP14+EGFR  Dizziness -     CBC with Differential/Platelet -     CMP14+EGFR -     TSH  Paroxysmal atrial fibrillation (HCC) -     CBC with Differential/Platelet -     CMP14+EGFR -     TSH    Wound care reviewed, Tegaderm applied.  I have changed Mr. Phifer's tamsulosin. I am also having him maintain his fish oil-omega-3 fatty acids, TURMERIC CURCUMIN PO, Multiple Vitamins-Minerals (PRESERVISION AREDS PO), triamcinolone cream, fluocinolone, ketoconazole, potassium chloride SA, albuterol, pantoprazole, acetaminophen, clobetasol, Melatonin, apixaban, metoprolol tartrate, furosemide, loratadine, bacitracin, rosuvastatin, celecoxib, and isosorbide mononitrate.  Allergies as of 06/03/2016      Reactions   Aspirin Shortness Of Breath, Swelling   Atorvastatin Other (See Comments)   Leg pain, feet pain   Cephalexin Other (See Comments)   unknown      Medication List       Accurate as of 06/03/16  9:38 AM. Always use your most recent med list.          acetaminophen 500 MG chewable tablet Commonly known as:  TYLENOL Chew 500 mg by mouth every 6 (six) hours as needed for pain.   albuterol 108 (90 Base) MCG/ACT inhaler Commonly known as:  PROVENTIL HFA;VENTOLIN HFA Inhale 2  puffs into the lungs every 6 (six) hours as needed for wheezing or shortness of breath.   bacitracin ointment Apply 1 application topically 2 (two) times daily.   celecoxib 200 MG capsule Commonly known as:  CELEBREX TAKE 1 CAPSULE (200 MG TOTAL) BY MOUTH DAILY. WITH FOOD. FOR JOINT PAIN   clobetasol 0.05 % external solution Commonly known as:  TEMOVATE APPLY 2-3 DROPS TO AFFECTED AREAS OF SCALP TWICE A DAY   ELIQUIS 5 MG Tabs tablet Generic drug:  apixaban Take 5 mg by mouth 2 (two) times daily.   fish oil-omega-3 fatty acids 1000 MG capsule Take 1 g by mouth daily.   fluocinolone 0.01 % cream Commonly known as:  VANOS Apply 1 application topically daily as needed (for skin).   furosemide 20 MG tablet Commonly known as:  LASIX Take 2 tablets (40 mg total) by mouth daily.   isosorbide mononitrate 30 MG 24 hr tablet Commonly known as:  IMDUR TAKE 1 TABLET BY MOUTH ONCE A DAY   ketoconazole 2 % shampoo Commonly known as:  NIZORAL Use 2-3 times weekly. Leave on for 5 min. Rinse.   loratadine 10 MG tablet Commonly known  as:  CLARITIN TAKE 1 TABLET (10 MG TOTAL) BY MOUTH DAILY.   Melatonin 5 MG Tabs Take 5 mg by mouth at bedtime.   metoprolol tartrate 25 MG tablet Commonly known as:  LOPRESSOR TAKE 1 TABLET (25 MG TOTAL) BY MOUTH 2 (TWO) TIMES DAILY.   pantoprazole 20 MG tablet Commonly known as:  PROTONIX Take 20 mg by mouth daily as needed for heartburn or indigestion.   potassium chloride SA 20 MEQ tablet Commonly known as:  K-DUR,KLOR-CON Take 1 tablet (20 mEq total) by mouth daily.   PRESERVISION AREDS PO Take 1 tablet by mouth daily.   rosuvastatin 5 MG tablet Commonly known as:  CRESTOR TAKE 1 TABLET BY MOUTH EVERY DAY   tamsulosin 0.4 MG Caps capsule Commonly known as:  FLOMAX Take 1 capsule (0.4 mg total) by mouth at bedtime.   triamcinolone cream 0.1 % Commonly known as:  KENALOG Apply 1 application topically 2 (two) times daily. Apply to AA of  trunk, arms, legs. Avoid underarms, genitals, face, groin.   TURMERIC CURCUMIN PO Take 1 tablet by mouth daily.        Follow-up: Return in about 3 months (around 09/01/2016).  [name], M.D.

## 2016-06-04 LAB — CMP14+EGFR
A/G RATIO: 1.7 (ref 1.2–2.2)
ALBUMIN: 4.3 g/dL (ref 3.5–4.7)
ALK PHOS: 46 IU/L (ref 39–117)
ALT: 15 IU/L (ref 0–44)
AST: 17 IU/L (ref 0–40)
BILIRUBIN TOTAL: 0.8 mg/dL (ref 0.0–1.2)
BUN / CREAT RATIO: 21 (ref 10–24)
BUN: 17 mg/dL (ref 8–27)
CHLORIDE: 100 mmol/L (ref 96–106)
CO2: 24 mmol/L (ref 18–29)
Calcium: 9.2 mg/dL (ref 8.6–10.2)
Creatinine, Ser: 0.8 mg/dL (ref 0.76–1.27)
GFR calc non Af Amer: 81 mL/min/{1.73_m2} (ref >59)
GFR, EST AFRICAN AMERICAN: 94 mL/min/{1.73_m2} (ref >59)
GLUCOSE: 104 mg/dL — AB (ref 65–99)
Globulin, Total: 2.5 g/dL (ref 1.5–4.5)
POTASSIUM: 4.4 mmol/L (ref 3.5–5.2)
Sodium: 141 mmol/L (ref 134–144)
TOTAL PROTEIN: 6.8 g/dL (ref 6.0–8.5)

## 2016-06-04 LAB — CBC WITH DIFFERENTIAL/PLATELET
BASOS ABS: 0 10*3/uL (ref 0.0–0.2)
BASOS: 0 %
EOS (ABSOLUTE): 0.3 10*3/uL (ref 0.0–0.4)
Eos: 5 %
HEMOGLOBIN: 12.7 g/dL — AB (ref 13.0–17.7)
Hematocrit: 37.9 % (ref 37.5–51.0)
IMMATURE GRANS (ABS): 0 10*3/uL (ref 0.0–0.1)
Immature Granulocytes: 0 %
LYMPHS ABS: 1.7 10*3/uL (ref 0.7–3.1)
Lymphs: 26 %
MCH: 33 pg (ref 26.6–33.0)
MCHC: 33.5 g/dL (ref 31.5–35.7)
MCV: 98 fL — AB (ref 79–97)
Monocytes Absolute: 0.6 10*3/uL (ref 0.1–0.9)
Monocytes: 9 %
Neutrophils Absolute: 3.9 10*3/uL (ref 1.4–7.0)
Neutrophils: 60 %
PLATELETS: 205 10*3/uL (ref 150–379)
RBC: 3.85 x10E6/uL — ABNORMAL LOW (ref 4.14–5.80)
RDW: 14 % (ref 12.3–15.4)
WBC: 6.4 10*3/uL (ref 3.4–10.8)

## 2016-06-04 LAB — TSH: TSH: 0.552 u[IU]/mL (ref 0.450–4.500)

## 2016-06-04 LAB — LIPID PANEL
CHOL/HDL RATIO: 2.7 ratio (ref 0.0–5.0)
Cholesterol, Total: 136 mg/dL (ref 100–199)
HDL: 51 mg/dL (ref >39)
LDL Calculated: 71 mg/dL (ref 0–99)
Triglycerides: 69 mg/dL (ref 0–149)
VLDL Cholesterol Cal: 14 mg/dL (ref 5–40)

## 2016-06-05 ENCOUNTER — Encounter: Payer: Medicare Other | Admitting: *Deleted

## 2016-06-07 ENCOUNTER — Encounter: Payer: Self-pay | Admitting: Cardiology

## 2016-06-07 ENCOUNTER — Ambulatory Visit: Payer: Medicare Other | Admitting: Internal Medicine

## 2016-06-10 ENCOUNTER — Ambulatory Visit (INDEPENDENT_AMBULATORY_CARE_PROVIDER_SITE_OTHER): Payer: Medicare Other | Admitting: *Deleted

## 2016-06-10 DIAGNOSIS — I495 Sick sinus syndrome: Secondary | ICD-10-CM

## 2016-06-13 ENCOUNTER — Other Ambulatory Visit: Payer: Self-pay | Admitting: Family Medicine

## 2016-06-19 ENCOUNTER — Encounter: Payer: Self-pay | Admitting: Cardiology

## 2016-06-19 NOTE — Progress Notes (Signed)
Remote pacemaker transmission.   

## 2016-06-20 ENCOUNTER — Encounter: Payer: Self-pay | Admitting: Cardiology

## 2016-06-27 ENCOUNTER — Encounter: Payer: Self-pay | Admitting: *Deleted

## 2016-06-28 ENCOUNTER — Other Ambulatory Visit: Payer: Self-pay | Admitting: Family Medicine

## 2016-06-28 DIAGNOSIS — I10 Essential (primary) hypertension: Secondary | ICD-10-CM

## 2016-06-29 ENCOUNTER — Other Ambulatory Visit: Payer: Self-pay | Admitting: Family Medicine

## 2016-06-29 DIAGNOSIS — L409 Psoriasis, unspecified: Secondary | ICD-10-CM

## 2016-07-01 ENCOUNTER — Telehealth: Payer: Self-pay | Admitting: Family Medicine

## 2016-07-01 NOTE — Telephone Encounter (Signed)
Returned patient's sons phone call.  Patients son states that patient has had hallucinations over the last week and would like to know if patient can come in and be evaluated for a UTI.  Appt made

## 2016-07-02 ENCOUNTER — Ambulatory Visit (INDEPENDENT_AMBULATORY_CARE_PROVIDER_SITE_OTHER): Payer: Medicare Other | Admitting: Physician Assistant

## 2016-07-02 ENCOUNTER — Encounter: Payer: Self-pay | Admitting: Physician Assistant

## 2016-07-02 VITALS — BP 112/66 | HR 71 | Temp 96.9°F | Ht 64.0 in | Wt 185.0 lb

## 2016-07-02 DIAGNOSIS — R413 Other amnesia: Secondary | ICD-10-CM | POA: Diagnosis not present

## 2016-07-02 DIAGNOSIS — R443 Hallucinations, unspecified: Secondary | ICD-10-CM | POA: Diagnosis not present

## 2016-07-02 DIAGNOSIS — R8271 Bacteriuria: Secondary | ICD-10-CM

## 2016-07-02 DIAGNOSIS — I25118 Atherosclerotic heart disease of native coronary artery with other forms of angina pectoris: Secondary | ICD-10-CM

## 2016-07-02 DIAGNOSIS — R300 Dysuria: Secondary | ICD-10-CM

## 2016-07-02 LAB — URINALYSIS
BILIRUBIN UA: NEGATIVE
GLUCOSE, UA: NEGATIVE
KETONES UA: NEGATIVE
Leukocytes, UA: NEGATIVE
Nitrite, UA: NEGATIVE
PROTEIN UA: NEGATIVE
RBC UA: NEGATIVE
SPEC GRAV UA: 1.015 (ref 1.005–1.030)
UUROB: 0.2 mg/dL (ref 0.2–1.0)
pH, UA: 5 (ref 5.0–7.5)

## 2016-07-02 NOTE — Progress Notes (Signed)
Cardiology Office Note   Date:  07/04/2016   ID:  GRACE HAGGART, DOB 04-01-1928, MRN [id number]  PCP:  [name], MD  Cardiologist:   Dorris Carnes, MD   F/U of CAD and HTN   History of Present Illness: Brad [name] is a 83 y.o. male with a history of HTN, COPD, CAD, AAA (s/p repair in 2003), SSS and AV block (s/p PPM in July 2017) and PAF  I saw him in October 2017   Since seen he says he gets tired    Has had some dizziness with falls.  He was recently taken off prostate med   Has not had any further falls, less dizziness.  Son says pt is eating but wt is down 10#  Denies CP  Breathing is stable        Current Meds  Medication Sig  . acetaminophen (TYLENOL) 500 MG chewable tablet Chew 500 mg by mouth every 6 (six) hours as needed for pain.  Marland Kitchen albuterol (PROVENTIL HFA;VENTOLIN HFA) 108 (90 Base) MCG/ACT inhaler Inhale 2 puffs into the lungs every 6 (six) hours as needed for wheezing or shortness of breath.  Marland Kitchen apixaban (ELIQUIS) 5 MG TABS tablet Take 5 mg by mouth 2 (two) times daily.  . bacitracin ointment Apply 1 application topically 2 (two) times daily.  . celecoxib (CELEBREX) 200 MG capsule TAKE 1 CAPSULE (200 MG TOTAL) BY MOUTH DAILY. WITH FOOD. FOR JOINT PAIN  . clobetasol (TEMOVATE) 0.05 % external solution APPLY 2-3 DROPS TO AFFECTED AREAS OF SCALP TWICE A DAY  . donepezil (ARICEPT) 10 MG tablet Take 1/2 tablet daily for 1 month, then increase to 1 tablet daily  . fish oil-omega-3 fatty acids 1000 MG capsule Take 1 g by mouth daily.    . fluocinolone (VANOS) 0.01 % cream Apply 1 application topically daily as needed (for skin).   . furosemide (LASIX) 20 MG tablet Take 2 tablets (40 mg total) by mouth daily.  . isosorbide mononitrate (IMDUR) 30 MG 24 hr tablet TAKE 1 TABLET BY MOUTH ONCE A DAY  . ketoconazole (NIZORAL) 2 % shampoo USE 2-3 TIMES WEEKLY. LEAVE ON FOR 5 MIN. RINSE.  Marland Kitchen KLOR-CON M20 20 MEQ tablet TAKE 1 TABLET (20 MEQ TOTAL) BY MOUTH DAILY.  Marland Kitchen  loratadine (CLARITIN) 10 MG tablet TAKE 1 TABLET (10 MG TOTAL) BY MOUTH DAILY.  . Melatonin 5 MG TABS Take 5 mg by mouth at bedtime.  . metoprolol tartrate (LOPRESSOR) 25 MG tablet TAKE 1 TABLET (25 MG TOTAL) BY MOUTH 2 (TWO) TIMES DAILY.  . Multiple Vitamins-Minerals (PRESERVISION AREDS PO) Take 1 tablet by mouth daily.  . pantoprazole (PROTONIX) 20 MG tablet Take 20 mg by mouth daily as needed for heartburn or indigestion.   . rosuvastatin (CRESTOR) 5 MG tablet TAKE 1 TABLET BY MOUTH EVERY DAY  . tamsulosin (FLOMAX) 0.4 MG CAPS capsule Take 1 capsule (0.4 mg total) by mouth at bedtime.  . triamcinolone cream (KENALOG) 0.1 % Apply 1 application topically 2 (two) times daily. Apply to AA of trunk, arms, legs. Avoid underarms, genitals, face, groin.  . TURMERIC CURCUMIN PO Take 1 tablet by mouth daily.     Allergies:   Aspirin; Atorvastatin; and Cephalexin   Past Medical History:  Diagnosis Date  . Ascending aortic aneurysm (HCC)    Dr. Roxan Hockey  . CAD (coronary artery disease)    s/p stent to OM2 in past;  Last LHC 3/05: EF 60%, left RA occluded, right RA ok, prox  to mid LAD 60%, oD1 75-80%, oD2 small 75%, pOM1 50%, OM2 stent ok with 50% before stent, mRCA 30%, dRCA 30%, PDA 70%.  Last dobutamine myoview 5/06: no scar or ischemia, EF 60%.  . Chronic lower back pain   . COPD (chronic obstructive pulmonary disease) ([place])   . DJD (degenerative joint disease)   . Enlarged prostate   . Ethanolism ([place])   . GERD (gastroesophageal reflux disease)   . Hernia, abdominal    "he has ~ 4; wears binder" (11/20/2015)  . HLD (hyperlipidemia)   . HTN (hypertension)    Last echo 1/07:  Normal LVF, LV thickness upper limits of normal, mild aortic root dilatation, mild to mod MAC, mild LAE, borderline RVH, mild RAE.  . Macular degeneration   . PAD (peripheral artery disease) ([place])   . Paroxysmal atrial fibrillation (HCC)   . Psoriasis   . S/P AAA repair   . Sinus node dysfunction (HCC)    a. s/p  MDT dual chamber PPM with His Bundle pacing  . Tobacco abuse   . Unilateral congenital absence of kidney     Past Surgical History:  Procedure Laterality Date  . APPENDECTOMY    . CARPAL TUNNEL RELEASE Right 12/26/2014   Procedure: RIGHT ENDOSCOPIC CARPAL TUNNEL SYNDROME RELEASE;  Surgeon: Milly Jakob, MD;  Location: [facility];  Service: Orthopedics;  Laterality: Right;  . CATARACT EXTRACTION, BILATERAL Bilateral   . CORONARY ANGIOPLASTY WITH STENT PLACEMENT     s/p stent to OM2 in past;   . EP IMPLANTABLE DEVICE N/A 11/20/2015   Procedure: Pacemaker Implant;  Surgeon: Evans Lance, MD;  Location: [facility];  Service: Cardiovascular;  Laterality: N/A;  . HERNIA REPAIR    . INSERT / REPLACE / REMOVE PACEMAKER  11/20/2015  . RESECTION AND GRAFTING OF INFARENAL  ABDOMINAL  AORTIC ANEURYSM  05/03/2002     Social History:  The patient  reports that he quit smoking about 20 years ago. His smoking use included Cigarettes. He has a 130.00 pack-year smoking history. He has never used smokeless tobacco. He reports that he drinks alcohol. He reports that he does not use drugs.   Family History:  The patient's family history includes COPD in his brother and sister; Cancer in his brother, mother, and sister; Early death in his sister; Stroke in his mother.    ROS:  Please see the history of present illness. All other systems are reviewed and  Negative to the above problem except as noted.    PHYSICAL EXAM: VS:  BP 113/71   Pulse 79   Ht 5' 4"  (1.626 m)   Wt 185 lb 6.4 oz (84.1 kg)   BMI 31.82 kg/m   GEN: Well nourished, well developed, in no acute distress  HEENT: normal  Neck: no JVD, carotid bruits, or masses Cardiac: RRR; no murmurs, rubs, or gallops,no edema  Respiratory:  clear to auscultation bilaterally, normal work of breathing GI: soft, nontender, nondistended, + BS  No hepatomegaly  MS: no deformity Moving all extremities   Skin: warm and dry, no  rash Neuro:  Strength and sensation are intact Psych: euthymic mood, full affect   EKG:  EKG is not ordered today.   Lipid Panel    Component Value Date/Time   CHOL 136 06/03/2016 0944   CHOL 127 09/05/2011 1029   TRIG 69 06/03/2016 0944   TRIG 29 09/05/2011 1029   HDL 51 06/03/2016 0944   HDL 51 09/05/2011 1029  CHOLHDL 2.7 06/03/2016 0944   CHOLHDL 3 05/03/2013 1438   VLDL 19.8 05/03/2013 1438   LDLCALC 71 06/03/2016 0944   LDLCALC 70 09/05/2011 1029      Wt Readings from Last 3 Encounters:  07/04/16 185 lb 6.4 oz (84.1 kg)  07/03/16 185 lb 1 oz (83.9 kg)  07/02/16 185 lb (83.9 kg)      ASSESSMENT AND PLAN:  1  CAD   I am not convinced of angina  Follow  2  PAF   Keep on anticoagulant for now  Son will call if he falls again  3  HTN  BP OK  4  S/p PPM  Followed by Beckie Salts  5  Dementia  Seen by neuro  CT of head pending      I will set f/u later this year     Current medicines are reviewed at length with the patient today.  The patient does not have concerns regarding medicines.  Signed, Dorris Carnes, MD  07/04/2016 10:41 AM    [address], [place], [place]  [address] Phone: [phone]; Fax: [phone]

## 2016-07-02 NOTE — Patient Instructions (Addendum)
Urinary Tract Infection, Adult Introduction A urinary tract infection (UTI) is an infection of any part of the urinary tract. The urinary tract includes the:  Kidneys.  Ureters.  Bladder.  Urethra. These organs make, store, and get rid of pee (urine) in the body. Follow these instructions at home:  Take over-the-counter and prescription medicines only as told by your doctor.  If you were prescribed an antibiotic medicine, take it as told by your doctor. Do not stop taking the antibiotic even if you start to feel better.  Avoid the following drinks:  Alcohol.  Caffeine.  Tea.  Carbonated drinks.  Drink enough fluid to keep your pee clear or pale yellow.  Keep all follow-up visits as told by your doctor. This is important.  Make sure to:  Empty your bladder often and completely. Do not to hold pee for long periods of time.  Empty your bladder before and after sex.  Wipe from front to back after a bowel movement if you are male. Use each tissue one time when you wipe. Contact a doctor if:  You have back pain.  You have a fever.  You feel sick to your stomach (nauseous).  You throw up (vomit).  Your symptoms do not get better after 3 days.  Your symptoms go away and then come back. Get help right away if:  You have very bad back pain.  You have very bad lower belly (abdominal) pain.  You are throwing up and cannot keep down any medicines or water. This information is not intended to replace advice given to you by your health care provider. Make sure you discuss any questions you have with your health care provider. Document Released: 10/23/2007 Document Revised: 10/12/2015 Document Reviewed: 03/27/2015  2017 Elsevier UTI

## 2016-07-03 ENCOUNTER — Encounter: Payer: Self-pay | Admitting: Neurology

## 2016-07-03 ENCOUNTER — Ambulatory Visit (INDEPENDENT_AMBULATORY_CARE_PROVIDER_SITE_OTHER): Payer: Medicare Other | Admitting: Neurology

## 2016-07-03 VITALS — BP 122/70 | HR 70 | Ht 64.0 in | Wt 185.1 lb

## 2016-07-03 DIAGNOSIS — I25118 Atherosclerotic heart disease of native coronary artery with other forms of angina pectoris: Secondary | ICD-10-CM

## 2016-07-03 DIAGNOSIS — F0390 Unspecified dementia without behavioral disturbance: Secondary | ICD-10-CM | POA: Diagnosis not present

## 2016-07-03 DIAGNOSIS — F03A0 Unspecified dementia, mild, without behavioral disturbance, psychotic disturbance, mood disturbance, and anxiety: Secondary | ICD-10-CM | POA: Insufficient documentation

## 2016-07-03 MED ORDER — DONEPEZIL HCL 10 MG PO TABS: ORAL_TABLET | ORAL | 11 refills | Status: DC

## 2016-07-03 NOTE — Patient Instructions (Addendum)
1. Schedule head CT without contrast 2. Start Aricept 10mg : Take 1/2 tablet daily for 1 month, then increase to 1 tablet daily 3. Look into Home Health 4. Physical exercise and brain stimulation exercises for brain health 5. Follow-up in 6 months, call for any changes

## 2016-07-03 NOTE — Progress Notes (Signed)
NEUROLOGY CONSULTATION NOTE  Brad [name] MRN: [id number] DOB: 05/19/1928  Referring provider: Dr. [name] Primary care provider: Dr. [name]  Reason for consult:  Memory loss, hallucinations  Dear Dr [name]:  Thank you for your kind referral of Brad [name] for consultation of the above symptoms. Although his history is well known to you, please allow me to reiterate it for the purpose of our medical record. The patient was accompanied to the clinic by his son who also provides collateral information. Records and images were personally reviewed where available.  HISTORY OF PRESENT ILLNESS: This is a pleasant 83 year old right-handed man with a history of hypertension, hyperlipidemia, CAD, sinus node dysfunction s/p PPM, paroxysmal atrial fibrillation, presenting for evaluation of worsening memory. He reports his memory is "terrible." He lives alone and states his sons have been helping him with bill payments and medications. His son has been noticing gradual memory changes over the past couple of years. He was concerned about his ability to keep up with his medications and took over a year ago. He took over [employer] a year ago as well. He and his brother noticed that confusion is worse in the evenings, he would get his phone and remote control confused, he would have trouble with his TV, or get confused as to where he is at. He has a house in the [place] and thought he was up there yesterday evening. His sons see him in the morning and evening for meals. His son has always worried about his driving, he has not seen him drive recklessly, but states he would miss a turn but be able to find his way home. The patient states he has gotten lost driving. He states he has left the stove on a few times. There have been some hallucinations where he sees and talks to his dead wife, he has both visual and auditory hallucinations of her. No personality changes.   He gets dizzy when  standing and has fallen a few times. He has neck and back pain worse in the past 6 months. He had right carpal tunnel surgery 1-2 years ago and is not dropping his fork anymore, but continues to have numbness and tingling in that hand. He denies any headaches, diplopia, dysarthria/dysphagia, bowel/bladder dysfunction, anosmia. He has occasional mild tremors. Sleep is good, but son reports he occasionally gets up at 1-2am and thinks it is time to wake up. He has had a 10lb weight loss even with good appetite. No family history of dementia. He denies any history of significant head injuries. He stopped drinking alcohol 25 years ago (used to be a heavy drinker).   Laboratory Data: Lab Results  Component Value Date   WBC 6.4 06/03/2016   HGB 14.0 02/05/2016   HCT 37.9 06/03/2016   MCV 98 (H) 06/03/2016   PLT 205 06/03/2016     Chemistry      Component Value Date/Time   NA 141 06/03/2016 0944   K 4.4 06/03/2016 0944   CL 100 06/03/2016 0944   CO2 24 06/03/2016 0944   BUN 17 06/03/2016 0944   CREATININE 0.80 06/03/2016 0944   CREATININE 0.70 02/05/2016 0911      Component Value Date/Time   CALCIUM 9.2 06/03/2016 0944   ALKPHOS 46 06/03/2016 0944   AST 17 06/03/2016 0944   ALT 15 06/03/2016 0944   BILITOT 0.8 06/03/2016 0944     Lab Results  Component Value Date   TSH 0.552  06/03/2016    PAST MEDICAL HISTORY: Past Medical History:  Diagnosis Date  . Ascending aortic aneurysm (HCC)    Dr. Roxan Hockey  . CAD (coronary artery disease)    s/p stent to OM2 in past;  Last LHC 3/05: EF 60%, left RA occluded, right RA ok, prox to mid LAD 60%, oD1 75-80%, oD2 small 75%, pOM1 50%, OM2 stent ok with 50% before stent, mRCA 30%, dRCA 30%, PDA 70%.  Last dobutamine myoview 5/06: no scar or ischemia, EF 60%.  . Chronic lower back pain   . COPD (chronic obstructive pulmonary disease) ([place])   . DJD (degenerative joint disease)   . Enlarged prostate   . Ethanolism ([place])   . GERD  (gastroesophageal reflux disease)   . Hernia, abdominal    "he has ~ 4; wears binder" (11/20/2015)  . HLD (hyperlipidemia)   . HTN (hypertension)    Last echo 1/07:  Normal LVF, LV thickness upper limits of normal, mild aortic root dilatation, mild to mod MAC, mild LAE, borderline RVH, mild RAE.  . Macular degeneration   . PAD (peripheral artery disease) ([place])   . Paroxysmal atrial fibrillation (HCC)   . Psoriasis   . S/P AAA repair   . Sinus node dysfunction (HCC)    a. s/p MDT dual chamber PPM with His Bundle pacing  . Tobacco abuse   . Unilateral congenital absence of kidney     PAST SURGICAL HISTORY: Past Surgical History:  Procedure Laterality Date  . APPENDECTOMY    . CARPAL TUNNEL RELEASE Right 12/26/2014   Procedure: RIGHT ENDOSCOPIC CARPAL TUNNEL SYNDROME RELEASE;  Surgeon: Milly Jakob, MD;  Location: [facility];  Service: Orthopedics;  Laterality: Right;  . CATARACT EXTRACTION, BILATERAL Bilateral   . CORONARY ANGIOPLASTY WITH STENT PLACEMENT     s/p stent to OM2 in past;   . EP IMPLANTABLE DEVICE N/A 11/20/2015   Procedure: Pacemaker Implant;  Surgeon: Evans Lance, MD;  Location: [facility];  Service: Cardiovascular;  Laterality: N/A;  . HERNIA REPAIR    . INSERT / REPLACE / REMOVE PACEMAKER  11/20/2015  . RESECTION AND GRAFTING OF INFARENAL  ABDOMINAL  AORTIC ANEURYSM  05/03/2002    MEDICATIONS: Current Outpatient Prescriptions on File Prior to Visit  Medication Sig Dispense Refill  . acetaminophen (TYLENOL) 500 MG chewable tablet Chew 500 mg by mouth every 6 (six) hours as needed for pain.    Marland Kitchen albuterol (PROVENTIL HFA;VENTOLIN HFA) 108 (90 Base) MCG/ACT inhaler Inhale 2 puffs into the lungs every 6 (six) hours as needed for wheezing or shortness of breath. 1 Inhaler 2  . apixaban (ELIQUIS) 5 MG TABS tablet Take 5 mg by mouth 2 (two) times daily.    . bacitracin ointment Apply 1 application topically 2 (two) times daily. 120 g 0  . celecoxib  (CELEBREX) 200 MG capsule TAKE 1 CAPSULE (200 MG TOTAL) BY MOUTH DAILY. WITH FOOD. FOR JOINT PAIN 30 capsule 1  . clobetasol (TEMOVATE) 0.05 % external solution APPLY 2-3 DROPS TO AFFECTED AREAS OF SCALP TWICE A DAY 50 mL 2  . fish oil-omega-3 fatty acids 1000 MG capsule Take 1 g by mouth daily.      . fluocinolone (VANOS) 0.01 % cream Apply 1 application topically daily as needed (for skin).     . furosemide (LASIX) 20 MG tablet Take 2 tablets (40 mg total) by mouth daily. 60 tablet 11  . isosorbide mononitrate (IMDUR) 30 MG 24 hr tablet TAKE 1  TABLET BY MOUTH ONCE A DAY 30 tablet 5  . ketoconazole (NIZORAL) 2 % shampoo USE 2-3 TIMES WEEKLY. LEAVE ON FOR 5 MIN. RINSE. 120 mL 0  . KLOR-CON M20 20 MEQ tablet TAKE 1 TABLET (20 MEQ TOTAL) BY MOUTH DAILY. 90 tablet 1  . loratadine (CLARITIN) 10 MG tablet TAKE 1 TABLET (10 MG TOTAL) BY MOUTH DAILY. 30 tablet 5  . Melatonin 5 MG TABS Take 5 mg by mouth at bedtime.    . metoprolol tartrate (LOPRESSOR) 25 MG tablet TAKE 1 TABLET (25 MG TOTAL) BY MOUTH 2 (TWO) TIMES DAILY. 60 tablet 5  . Multiple Vitamins-Minerals (PRESERVISION AREDS PO) Take 1 tablet by mouth daily.    . pantoprazole (PROTONIX) 20 MG tablet Take 20 mg by mouth daily as needed for heartburn or indigestion.     . rosuvastatin (CRESTOR) 5 MG tablet TAKE 1 TABLET BY MOUTH EVERY DAY 90 tablet 0  . tamsulosin (FLOMAX) 0.4 MG CAPS capsule Take 1 capsule (0.4 mg total) by mouth at bedtime. 180 capsule 0  . triamcinolone cream (KENALOG) 0.1 % Apply 1 application topically 2 (two) times daily. Apply to AA of trunk, arms, legs. Avoid underarms, genitals, face, groin. 453.6 g 3  . TURMERIC CURCUMIN PO Take 1 tablet by mouth daily.     No current facility-administered medications on file prior to visit.     ALLERGIES: Allergies  Allergen Reactions  . Aspirin Shortness Of Breath and Swelling  . Atorvastatin Other (See Comments)    Leg pain, feet pain  . Cephalexin Other (See Comments)     unknown    FAMILY HISTORY: Family History  Problem Relation Age of Onset  . Cancer Mother     uterine  . Stroke Mother     hemi plagic  . Cancer Sister   . COPD Sister   . COPD Brother   . Cancer Brother     Lip  . Early death Sister     SOCIAL HISTORY: Social History   Social History  . Marital status: Widowed    Spouse name: N/A  . Number of children: N/A  . Years of education: N/A   Occupational History  . Not on file.   Social History Main Topics  . Smoking status: Former Smoker    Packs/day: 2.50    Years: 52.00    Types: Cigarettes    Quit date: 05/06/1996  . Smokeless tobacco: Never Used  . Alcohol use Yes     Comment: 11/20/2015 "used to drink heavily; quit 05/06/1996"  . Drug use: No  . Sexual activity: No   Other Topics Concern  . Not on file   Social History Narrative  . No narrative on file    REVIEW OF SYSTEMS: Constitutional: No fevers, chills, or sweats, no generalized fatigue, change in appetite Eyes: No visual changes, double vision, eye pain Ear, nose and throat: No hearing loss, ear pain, nasal congestion, sore throat Cardiovascular: No chest pain, palpitations Respiratory:  No shortness of breath at rest or with exertion, wheezes GastrointestinaI: No nausea, vomiting, diarrhea, abdominal pain, fecal incontinence Genitourinary:  No dysuria, urinary retention or frequency Musculoskeletal:  No neck pain, back pain Integumentary: No rash, pruritus, skin lesions Neurological: as above Psychiatric: No depression, insomnia, anxiety Endocrine: No palpitations, fatigue, diaphoresis, mood swings, change in appetite, change in weight, increased thirst Hematologic/Lymphatic:  No anemia, purpura, petechiae. Allergic/Immunologic: no itchy/runny eyes, nasal congestion, recent allergic reactions, rashes  PHYSICAL EXAM: Vitals:   07/03/16 1025  BP: 122/70  Pulse: 70   General: No acute distress Head:  Normocephalic/atraumatic Eyes: Fundoscopic  exam shows bilateral sharp discs, no vessel changes, exudates, or hemorrhages Neck: supple, no paraspinal tenderness, full range of motion Back: No paraspinal tenderness Heart: regular rate and rhythm Lungs: Clear to auscultation bilaterally. Vascular: No carotid bruits. Skin/Extremities: No rash, no edema Neurological Exam: Mental status: alert and oriented to person, place, month/day of week, no dysarthria or aphasia, Fund of knowledge is appropriate.  Remote memory intact.  Attention and concentration are normal.    Able to name objects and repeat phrases. CDT 5/5 MMSE - Mini Mental State Exam 07/03/2016 02/19/2016 02/19/2016  Orientation to time _0 Orientation to Place _1 Registration _2 Attention/ Calculation 1 2 -  Recall 0 0 0  Language- name 2 objects 2 1 -  Language- repeat 1 1 -  Language- follow 3 step command 2 1 -  Language- read & follow direction 1 1 -  Write a sentence 1 1 -  Copy design 0 1 -  Total score 18 19 -   Cranial nerves: CN I: not tested CN II: pupils equal, round and reactive to light, visual fields intact, fundi unremarkable. CN III, IV, VI:  full range of motion, no nystagmus, no ptosis CN V: facial sensation intact CN VII: upper and lower face symmetric CN VIII: hearing intact to finger rub CN IX, X: gag intact, uvula midline CN XI: sternocleidomastoid and trapezius muscles intact CN XII: tongue midline Bulk & Tone: normal, no fasciculations. Motor: 5/5 throughout with no pronator drift. Sensation: intact to light touch, cold, pin, vibration and joint position sense.  No extinction to double simultaneous stimulation.  Romberg test negative Deep Tendon Reflexes: +2 throughout, no ankle clonus Plantar responses: downgoing bilaterally Cerebellar: no incoordination on finger to nose, heel to shin. No dysdiadochokinesia Gait: slow and cautious with cane, no ataxia Tremor: none  IMPRESSION: This is a pleasant 83 year old right-handed man  with a history of  hypertension, hyperlipidemia, CAD, sinus node dysfunction s/p PPM, paroxysmal atrial fibrillation, presenting for evaluation of worsening memory. Neurological exam non-focal, MMSE today 18/30, indicating mild dementia. Head CT without contrast will be ordered to assess for underlying structural abnormality and vascular load, he is unable to do MRI due to pacemaker. He may benefit from starting cholinesterase inhibitors such as Aricept, side effects and expectations from the  Medication were discussed. His son is interested in having home health come for medication management and home safety evaluation, patient lives alone. Continue to monitor driving. We discussed the importance of physical exercise and brain stimulation exercises for brain health. He will follow-up in 6 months and knows to call for any changes.  Thank you for allowing me to participate in the care of this patient. Please do not hesitate to call for any questions or concerns.   Ellouise Newer, M.D.  CC: Dr. [name]

## 2016-07-04 ENCOUNTER — Encounter: Payer: Self-pay | Admitting: Internal Medicine

## 2016-07-04 ENCOUNTER — Ambulatory Visit (INDEPENDENT_AMBULATORY_CARE_PROVIDER_SITE_OTHER): Payer: Medicare Other | Admitting: Internal Medicine

## 2016-07-04 VITALS — BP 113/71 | HR 79 | Ht 64.0 in | Wt 185.4 lb

## 2016-07-04 DIAGNOSIS — I480 Paroxysmal atrial fibrillation: Secondary | ICD-10-CM

## 2016-07-04 DIAGNOSIS — I10 Essential (primary) hypertension: Secondary | ICD-10-CM | POA: Diagnosis not present

## 2016-07-04 DIAGNOSIS — R443 Hallucinations, unspecified: Secondary | ICD-10-CM | POA: Insufficient documentation

## 2016-07-04 DIAGNOSIS — I25118 Atherosclerotic heart disease of native coronary artery with other forms of angina pectoris: Secondary | ICD-10-CM

## 2016-07-04 DIAGNOSIS — I2510 Atherosclerotic heart disease of native coronary artery without angina pectoris: Secondary | ICD-10-CM | POA: Diagnosis not present

## 2016-07-04 DIAGNOSIS — R413 Other amnesia: Secondary | ICD-10-CM | POA: Insufficient documentation

## 2016-07-04 LAB — URINE CULTURE

## 2016-07-04 NOTE — Progress Notes (Signed)
BP 112/66   Pulse 71   Temp (!) 96.9 F (36.1 C) (Oral)   Ht 5' 4"  (1.626 m)   Wt 185 lb (83.9 kg)   BMI 31.76 kg/m    Subjective:    Patient ID: Brad [name], male    DOB: 1927-10-12, 83 y.o.   MRN: [id number]  HPI: Brad [name] is a 83 y.o. male presenting on 07/02/2016 for Urinary Tract Infection; Hallucinations; and decrease memory  After the past 3-4 weeks patient has had reported decrease in memory and hallucinations. He is a very strong family unit that helps take care of him. There is been no change in his urinary function. They're concerned about an infection causing this has had a long-standing history of HIV fibrillation is followed by Drs. Lovena Le and [place] area all his medications are reviewed today.  Relevant past medical, surgical, family and social history reviewed and updated as indicated. Allergies and medications reviewed and updated.  Past Medical History:  Diagnosis Date  . Ascending aortic aneurysm (HCC)    Dr. Roxan Hockey  . CAD (coronary artery disease)    s/p stent to OM2 in past;  Last LHC 3/05: EF 60%, left RA occluded, right RA ok, prox to mid LAD 60%, oD1 75-80%, oD2 small 75%, pOM1 50%, OM2 stent ok with 50% before stent, mRCA 30%, dRCA 30%, PDA 70%.  Last dobutamine myoview 5/06: no scar or ischemia, EF 60%.  . Chronic lower back pain   . COPD (chronic obstructive pulmonary disease) ([place])   . DJD (degenerative joint disease)   . Enlarged prostate   . Ethanolism ([place])   . GERD (gastroesophageal reflux disease)   . Hernia, abdominal    "he has ~ 4; wears binder" (11/20/2015)  . HLD (hyperlipidemia)   . HTN (hypertension)    Last echo 1/07:  Normal LVF, LV thickness upper limits of normal, mild aortic root dilatation, mild to mod MAC, mild LAE, borderline RVH, mild RAE.  . Macular degeneration   . PAD (peripheral artery disease) ([place])   . Paroxysmal atrial fibrillation (HCC)   . Psoriasis   . S/P AAA repair   . Sinus node dysfunction (HCC)    a. s/p MDT dual chamber PPM with His Bundle pacing  . Tobacco abuse   . Unilateral congenital absence of kidney     Past Surgical History:  Procedure Laterality Date  . APPENDECTOMY    . CARPAL TUNNEL RELEASE Right 12/26/2014   Procedure: RIGHT ENDOSCOPIC CARPAL TUNNEL SYNDROME RELEASE;  Surgeon: Milly Jakob, MD;  Location: [facility];  Service: Orthopedics;  Laterality: Right;  . CATARACT EXTRACTION, BILATERAL Bilateral   . CORONARY ANGIOPLASTY WITH STENT PLACEMENT     s/p stent to OM2 in past;   . EP IMPLANTABLE DEVICE N/A 11/20/2015   Procedure: Pacemaker Implant;  Surgeon: Evans Lance, MD;  Location: [facility];  Service: Cardiovascular;  Laterality: N/A;  . HERNIA REPAIR    . INSERT / REPLACE / REMOVE PACEMAKER  11/20/2015  . RESECTION AND GRAFTING OF INFARENAL  ABDOMINAL  AORTIC ANEURYSM  05/03/2002    Review of Systems  Constitutional: Positive for fatigue. Negative for appetite change and fever.  HENT: Negative.   Eyes: Negative.  Negative for pain and visual disturbance.  Respiratory: Negative.  Negative for cough, chest tightness, shortness of breath and wheezing.   Cardiovascular: Negative.  Negative for chest pain, palpitations and leg swelling.  Gastrointestinal: Negative.  Negative for abdominal pain,  diarrhea, nausea and vomiting.  Endocrine: Negative.   Genitourinary: Negative.  Negative for difficulty urinating, dysuria, flank pain, frequency and hematuria.  Musculoskeletal: Negative.   Skin: Negative.  Negative for color change and rash.  Neurological: Negative.  Negative for dizziness, seizures, weakness, numbness and headaches.  Psychiatric/Behavioral: Positive for decreased concentration and hallucinations. Negative for sleep disturbance. The patient is not nervous/anxious.     Allergies as of 07/02/2016      Reactions   Aspirin Shortness Of Breath, Swelling   Atorvastatin Other (See Comments)   Leg pain, feet pain   Cephalexin Other  (See Comments)   unknown      Medication List       Accurate as of 07/02/16 11:59 PM. Always use your most recent med list.          acetaminophen 500 MG chewable tablet Commonly known as:  TYLENOL Chew 500 mg by mouth every 6 (six) hours as needed for pain.   albuterol 108 (90 Base) MCG/ACT inhaler Commonly known as:  PROVENTIL HFA;VENTOLIN HFA Inhale 2 puffs into the lungs every 6 (six) hours as needed for wheezing or shortness of breath.   bacitracin ointment Apply 1 application topically 2 (two) times daily.   celecoxib 200 MG capsule Commonly known as:  CELEBREX TAKE 1 CAPSULE (200 MG TOTAL) BY MOUTH DAILY. WITH FOOD. FOR JOINT PAIN   clobetasol 0.05 % external solution Commonly known as:  TEMOVATE APPLY 2-3 DROPS TO AFFECTED AREAS OF SCALP TWICE A DAY   ELIQUIS 5 MG Tabs tablet Generic drug:  apixaban Take 5 mg by mouth 2 (two) times daily.   fish oil-omega-3 fatty acids 1000 MG capsule Take 1 g by mouth daily.   fluocinolone 0.01 % cream Commonly known as:  VANOS Apply 1 application topically daily as needed (for skin).   furosemide 20 MG tablet Commonly known as:  LASIX Take 2 tablets (40 mg total) by mouth daily.   isosorbide mononitrate 30 MG 24 hr tablet Commonly known as:  IMDUR TAKE 1 TABLET BY MOUTH ONCE A DAY   ketoconazole 2 % shampoo Commonly known as:  NIZORAL USE 2-3 TIMES WEEKLY. LEAVE ON FOR 5 MIN. RINSE.   KLOR-CON M20 20 MEQ tablet Generic drug:  potassium chloride SA TAKE 1 TABLET (20 MEQ TOTAL) BY MOUTH DAILY.   loratadine 10 MG tablet Commonly known as:  CLARITIN TAKE 1 TABLET (10 MG TOTAL) BY MOUTH DAILY.   Melatonin 5 MG Tabs Take 5 mg by mouth at bedtime.   metoprolol tartrate 25 MG tablet Commonly known as:  LOPRESSOR TAKE 1 TABLET (25 MG TOTAL) BY MOUTH 2 (TWO) TIMES DAILY.   pantoprazole 20 MG tablet Commonly known as:  PROTONIX Take 20 mg by mouth daily as needed for heartburn or indigestion.   PRESERVISION AREDS  PO Take 1 tablet by mouth daily.   rosuvastatin 5 MG tablet Commonly known as:  CRESTOR TAKE 1 TABLET BY MOUTH EVERY DAY   tamsulosin 0.4 MG Caps capsule Commonly known as:  FLOMAX Take 1 capsule (0.4 mg total) by mouth at bedtime.   triamcinolone cream 0.1 % Commonly known as:  KENALOG Apply 1 application topically 2 (two) times daily. Apply to AA of trunk, arms, legs. Avoid underarms, genitals, face, groin.   TURMERIC CURCUMIN PO Take 1 tablet by mouth daily.          Objective:    BP 112/66   Pulse 71   Temp (!) 96.9 F (36.1 C) (  Oral)   Ht 5' 4"  (1.626 m)   Wt 185 lb (83.9 kg)   BMI 31.76 kg/m   Allergies  Allergen Reactions  . Aspirin Shortness Of Breath and Swelling  . Atorvastatin Other (See Comments)    Leg pain, feet pain  . Cephalexin Other (See Comments)    unknown    Physical Exam  Constitutional: He appears well-developed and well-nourished.  HENT:  Head: Normocephalic and atraumatic.  Eyes: Conjunctivae and EOM are normal. Pupils are equal, round, and reactive to light.  Neck: Normal range of motion. Neck supple.  Cardiovascular: Normal rate, regular rhythm and normal heart sounds.   Pulmonary/Chest: Effort normal and breath sounds normal.  Abdominal: Soft. Bowel sounds are normal.  Musculoskeletal: Normal range of motion.  Skin: Skin is warm and dry.    Results for orders placed or performed in visit on 07/02/16  Urine culture  Result Value Ref Range   Urine Culture, Routine Final report    Urine Culture result 1 Comment   Urinalysis  Result Value Ref Range   Specific Gravity, UA 1.015 1.005 - 1.030   pH, UA 5.0 5.0 - 7.5   Color, UA Yellow Yellow   Appearance Ur Clear Clear   Leukocytes, UA Negative Negative   Protein, UA Negative Negative/Trace   Glucose, UA Negative Negative   Ketones, UA Negative Negative   RBC, UA Negative Negative   Bilirubin, UA Negative Negative   Urobilinogen, Ur 0.2 0.2 - 1.0 mg/dL   Nitrite, UA Negative  Negative      Assessment & Plan:   1. Dysuria - Urinalysis - Urine culture  2. Memory loss of unknown cause - Ambulatory referral to Neurology  3. Hallucination - Ambulatory referral to Neurology   Continue all other maintenance medications as listed above.  Follow up plan: Return if symptoms worsen or fail to improve, for keep follow up with primary care.  Educational handout given for UTI  Terald Sleeper PA-C [facility] [address]  [place], [place] [address] [phone]   07/04/2016, 8:46 PM

## 2016-07-05 ENCOUNTER — Telehealth: Payer: Self-pay | Admitting: Family Medicine

## 2016-07-05 ENCOUNTER — Other Ambulatory Visit: Payer: Self-pay | Admitting: Physician Assistant

## 2016-07-05 ENCOUNTER — Telehealth: Payer: Self-pay | Admitting: *Deleted

## 2016-07-05 DIAGNOSIS — R8271 Bacteriuria: Secondary | ICD-10-CM

## 2016-07-05 NOTE — Addendum Note (Signed)
Addended by: Liliane Bade on: 07/05/2016 04:09 PM   Modules accepted: Orders

## 2016-07-05 NOTE — Telephone Encounter (Signed)
Spoke with pt's son and he said pt started on Aricept yesterday and today his stool has been darker but not black. Dr Evette Doffing states it is more than likely something he ate but to monitor it and to call back with any questions or concerns and schedule an appt. MD feedback given to pt's son and he voiced understanding.

## 2016-07-05 NOTE — Telephone Encounter (Signed)
Bactrim called to [facility] voice mail.  Son will bring in urine specimen for culture to be sent out.

## 2016-07-07 LAB — URINE CULTURE

## 2016-07-16 LAB — CUP PACEART REMOTE DEVICE CHECK
Brady Statistic AP VS Percent: 16.5 %
Date Time Interrogation Session: 20180227100357
Implantable Lead Implant Date: 20170703
Implantable Lead Implant Date: 20170703
Implantable Lead Model: 3830
Implantable Pulse Generator Implant Date: 20170703
Lead Channel Impedance Value: 418 Ohm
Lead Channel Pacing Threshold Amplitude: 1.125 V
Lead Channel Pacing Threshold Pulse Width: 0.4 ms
Lead Channel Pacing Threshold Pulse Width: 0.4 ms
Lead Channel Sensing Intrinsic Amplitude: 6.8 mV
Lead Channel Setting Pacing Amplitude: 1.75 V
Lead Channel Setting Sensing Sensitivity: 0.9 mV
MDC IDC LEAD LOCATION: 753859
MDC IDC LEAD LOCATION: 753860
MDC IDC MSMT LEADCHNL RA IMPEDANCE VALUE: 475 Ohm
MDC IDC MSMT LEADCHNL RA PACING THRESHOLD AMPLITUDE: 0.875 V
MDC IDC MSMT LEADCHNL RA SENSING INTR AMPL: 3.3 mV
MDC IDC SET LEADCHNL RV PACING AMPLITUDE: 2 V
MDC IDC SET LEADCHNL RV PACING PULSEWIDTH: 0.4 ms
MDC IDC STAT BRADY AP VP PERCENT: 74.2 %
MDC IDC STAT BRADY AS VP PERCENT: 0.1 % — AB
MDC IDC STAT BRADY AS VS PERCENT: 9.3 %

## 2016-07-26 ENCOUNTER — Other Ambulatory Visit: Payer: Self-pay | Admitting: Family Medicine

## 2016-07-30 ENCOUNTER — Ambulatory Visit
Admission: RE | Admit: 2016-07-30 | Discharge: 2016-07-30 | Disposition: A | Discharge status: Home / Self Care | Payer: Medicare Other | Source: Ambulatory Visit | Attending: Neurology | Admitting: Neurology

## 2016-07-30 DIAGNOSIS — F03A0 Unspecified dementia, mild, without behavioral disturbance, psychotic disturbance, mood disturbance, and anxiety: Secondary | ICD-10-CM

## 2016-07-30 DIAGNOSIS — F0390 Unspecified dementia without behavioral disturbance: Secondary | ICD-10-CM

## 2016-07-30 DIAGNOSIS — R42 Dizziness and giddiness: Secondary | ICD-10-CM | POA: Diagnosis not present

## 2016-08-02 ENCOUNTER — Ambulatory Visit: Payer: Medicare Other | Admitting: Family

## 2016-08-08 DIAGNOSIS — Z961 Presence of intraocular lens: Secondary | ICD-10-CM | POA: Diagnosis not present

## 2016-08-08 DIAGNOSIS — H353132 Nonexudative age-related macular degeneration, bilateral, intermediate dry stage: Secondary | ICD-10-CM | POA: Diagnosis not present

## 2016-08-08 DIAGNOSIS — H26492 Other secondary cataract, left eye: Secondary | ICD-10-CM | POA: Diagnosis not present

## 2016-08-08 DIAGNOSIS — E7800 Pure hypercholesterolemia, unspecified: Secondary | ICD-10-CM | POA: Diagnosis not present

## 2016-08-14 ENCOUNTER — Other Ambulatory Visit: Payer: Self-pay | Admitting: Family Medicine

## 2016-08-25 ENCOUNTER — Other Ambulatory Visit: Payer: Self-pay | Admitting: Family Medicine

## 2016-09-03 ENCOUNTER — Ambulatory Visit (HOSPITAL_COMMUNITY)
Admission: RE | Admit: 2016-09-03 | Discharge: 2016-09-03 | Disposition: A | Discharge status: Home / Self Care | Payer: Medicare Other | Source: Ambulatory Visit | Attending: Family Medicine | Admitting: Family Medicine

## 2016-09-03 ENCOUNTER — Ambulatory Visit (INDEPENDENT_AMBULATORY_CARE_PROVIDER_SITE_OTHER): Payer: Medicare Other | Admitting: Family Medicine

## 2016-09-03 VITALS — BP 140/73 | HR 63 | Temp 97.2°F | Wt 180.0 lb

## 2016-09-03 DIAGNOSIS — R410 Disorientation, unspecified: Secondary | ICD-10-CM

## 2016-09-03 DIAGNOSIS — I25118 Atherosclerotic heart disease of native coronary artery with other forms of angina pectoris: Secondary | ICD-10-CM

## 2016-09-03 DIAGNOSIS — R4182 Altered mental status, unspecified: Secondary | ICD-10-CM | POA: Diagnosis not present

## 2016-09-03 LAB — URINALYSIS, COMPLETE
BILIRUBIN UA: NEGATIVE
Glucose, UA: NEGATIVE
Ketones, UA: NEGATIVE
LEUKOCYTES UA: NEGATIVE
Nitrite, UA: NEGATIVE
PH UA: 5.5 (ref 5.0–7.5)
Protein, UA: NEGATIVE
Specific Gravity, UA: 1.015 (ref 1.005–1.030)
Urobilinogen, Ur: 1 mg/dL (ref 0.2–1.0)

## 2016-09-03 LAB — FINGERSTICK HEMOGLOBIN: Hemoglobin: 13.5 g/dL (ref 12.6–17.7)

## 2016-09-03 LAB — MICROSCOPIC EXAMINATION
Bacteria, UA: NONE SEEN
Epithelial Cells (non renal): NONE SEEN /hpf (ref 0–10)
Renal Epithel, UA: NONE SEEN /hpf
WBC, UA: NONE SEEN /hpf (ref 0–?)

## 2016-09-03 LAB — GLUCOSE HEMOCUE WAIVED: Glu Hemocue Waived: 89 mg/dL (ref 65–99)
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
## 2016-09-05 ENCOUNTER — Encounter: Payer: Self-pay | Admitting: Family Medicine

## 2016-09-05 ENCOUNTER — Ambulatory Visit (INDEPENDENT_AMBULATORY_CARE_PROVIDER_SITE_OTHER): Payer: Medicare Other | Admitting: Family Medicine

## 2016-09-05 ENCOUNTER — Telehealth: Payer: Self-pay | Admitting: Family Medicine

## 2016-09-05 VITALS — BP 107/65 | HR 62 | Temp 97.2°F | Ht 64.0 in | Wt 181.5 lb

## 2016-09-05 DIAGNOSIS — I10 Essential (primary) hypertension: Secondary | ICD-10-CM

## 2016-09-05 DIAGNOSIS — E782 Mixed hyperlipidemia: Secondary | ICD-10-CM | POA: Diagnosis not present

## 2016-09-05 DIAGNOSIS — I25118 Atherosclerotic heart disease of native coronary artery with other forms of angina pectoris: Secondary | ICD-10-CM

## 2016-09-05 DIAGNOSIS — F0390 Unspecified dementia without behavioral disturbance: Secondary | ICD-10-CM

## 2016-09-05 DIAGNOSIS — I480 Paroxysmal atrial fibrillation: Secondary | ICD-10-CM | POA: Diagnosis not present

## 2016-09-05 DIAGNOSIS — J410 Simple chronic bronchitis: Secondary | ICD-10-CM

## 2016-09-05 DIAGNOSIS — F03A0 Unspecified dementia, mild, without behavioral disturbance, psychotic disturbance, mood disturbance, and anxiety: Secondary | ICD-10-CM

## 2016-09-05 LAB — URINE CULTURE

## 2016-09-05 NOTE — Telephone Encounter (Signed)
Patient's son called stating that patient is really confused again and is saying things that does not make since. Patient is getting mad with the Newspaper and stating that they all need to be burned.

## 2016-09-05 NOTE — Progress Notes (Signed)
Subjective:  Patient ID: Brad [name], male    DOB: June 29, 1927  Age: 83 y.o. MRN: [id number]  CC: Hypertension (pt here today for routine follow up on chronic medical conditions.)   HPI Brad [name] presents for Remembers the "dream" from 2 days ago. Says his memory is going and it seemed very real. CT read as no acute changes. Pt. Has been on full dose aricept now for 1 month.Currently his son who is attending him says that his symptoms have returned to normal. He is calm and his in no distress. Activities back to normal.  Patient in for follow-up of elevated cholesterol. Doing well without complaints on current medication. Denies side effects of statin including myalgia and arthralgia and nausea. Also in today for liver function testing. Currently no chest pain, shortness of breath or other cardiovascular related symptoms noted.    Patient in for follow-up of atrial fibrillation. Patient denies any recent bouts of chest pain or palpitations. Additionally, patient is taking Eliquis anticoagulant. Patient denies any recent excessive bleeding episodes including epistaxis, bleeding from the gums, genitalia, rectal bleeding or hematuria. Additionally there has been no excessive bruising.   follow-up of hypertension. Patient has no history of headache chest pain or shortness of breath or recent cough. Patient also denies symptoms of TIA such as numbness weakness lateralizing. Patient denies side effects from his medication. States taking it regularly. He seems to be breathing well and is not suffering any symptoms from his chronic bronchitis currently. He continues to use his albuterol on a when necessary basis.   History Brad [name] has a past medical history of Ascending aortic aneurysm ([place]); CAD (coronary artery disease); Chronic lower back pain; COPD (chronic obstructive pulmonary disease) (HCC); DJD (degenerative joint disease); Enlarged prostate; Ethanolism ([place]); GERD (gastroesophageal reflux  disease); Hernia, abdominal; HLD (hyperlipidemia); HTN (hypertension); Macular degeneration; PAD (peripheral artery disease) ([place]); Paroxysmal atrial fibrillation ([place]); Psoriasis; S/P AAA repair; Sinus node dysfunction ([place]); Tobacco abuse; and Unilateral congenital absence of kidney.   He has a past surgical history that includes Appendectomy; RESECTION AND GRAFTING OF INFARENAL  ABDOMINAL  AORTIC ANEURYSM (05/03/2002); Carpal tunnel release (Right, 12/26/2014); Insert / replace / remove pacemaker (11/20/2015); Hernia repair; Cataract extraction, bilateral (Bilateral); Coronary angioplasty with stent; and Cardiac catheterization (N/A, 11/20/2015).   His family history includes COPD in his brother and sister; Cancer in his brother, mother, and sister; Early death in his sister; Stroke in his mother.He reports that he quit smoking about 20 years ago. His smoking use included Cigarettes. He has a 130.00 pack-year smoking history. He has never used smokeless tobacco. He reports that he drinks alcohol. He reports that he does not use drugs.    ROS Review of Systems  Constitutional: Negative for chills, diaphoresis and fever.  HENT: Negative for rhinorrhea and sore throat.   Respiratory: Negative for cough and shortness of breath.   Cardiovascular: Negative for chest pain.  Gastrointestinal: Negative for abdominal pain.  Musculoskeletal: Negative for arthralgias and myalgias.  Skin: Negative for rash.  Neurological: Negative for weakness and headaches.  Psychiatric/Behavioral: Positive for confusion.    Objective:  BP 107/65   Pulse 62   Temp 97.2 F (36.2 C) (Oral)   Ht 5' 4" (1.626 m)   Wt 181 lb 8 oz (82.3 kg)   BMI 31.15 kg/m   BP Readings from Last 3 Encounters:  09/05/16 107/65  09/03/16 140/73  07/04/16 113/71    Wt Readings from Last 3 Encounters:  09/05/16 181  lb 8 oz (82.3 kg)  09/03/16 180 lb (81.6 kg)  07/04/16 185 lb 6.4 oz (84.1 kg)     Physical Exam  Constitutional:  He appears well-developed and well-nourished.  HENT:  Head: Normocephalic and atraumatic.  Right Ear: Tympanic membrane and external ear normal. No decreased hearing is noted.  Left Ear: Tympanic membrane and external ear normal. No decreased hearing is noted.  Mouth/Throat: No oropharyngeal exudate or posterior oropharyngeal erythema.  Eyes: Pupils are equal, round, and reactive to light.  Neck: Normal range of motion. Neck supple.  Cardiovascular: Normal rate and regular rhythm.   No murmur heard. Pulmonary/Chest: Breath sounds normal. No respiratory distress.  Abdominal: Soft. Bowel sounds are normal. He exhibits no mass. There is no tenderness.  Vitals reviewed.     Assessment & Plan:   Brad [name] was seen today for hypertension.  Diagnoses and all orders for this visit:  Essential hypertension -     CBC with Differential/Platelet -     CMP14+EGFR  Simple chronic bronchitis (HCC) -     CBC with Differential/Platelet -     CMP14+EGFR  Mild dementia -     CBC with Differential/Platelet -     CMP14+EGFR -     Ammonia  Paroxysmal atrial fibrillation (HCC) -     CBC with Differential/Platelet -     CMP14+EGFR  Mixed hyperlipidemia -     CBC with Differential/Platelet -     CMP14+EGFR -     Lipid panel       I have discontinued Brad [name]'s apixaban. I am also having him maintain his fish oil-omega-3 fatty acids, TURMERIC CURCUMIN PO, Multiple Vitamins-Minerals (PRESERVISION AREDS PO), triamcinolone cream, fluocinolone, albuterol, pantoprazole, acetaminophen, clobetasol, Melatonin, furosemide, loratadine, bacitracin, tamsulosin, isosorbide mononitrate, KLOR-CON M20, ketoconazole, donepezil, rosuvastatin, ELIQUIS, celecoxib, and metoprolol tartrate.  Allergies as of 09/05/2016      Reactions   Aspirin Shortness Of Breath, Swelling   Atorvastatin Other (See Comments)   Leg pain, feet pain   Cephalexin Other (See Comments)   unknown      Medication List         Accurate as of 09/05/16 11:59 PM. Always use your most recent med list.          acetaminophen 500 MG chewable tablet Commonly known as:  TYLENOL Chew 500 mg by mouth every 6 (six) hours as needed for pain.   albuterol 108 (90 Base) MCG/ACT inhaler Commonly known as:  PROVENTIL HFA;VENTOLIN HFA Inhale 2 puffs into the lungs every 6 (six) hours as needed for wheezing or shortness of breath.   bacitracin ointment Apply 1 application topically 2 (two) times daily.   celecoxib 200 MG capsule Commonly known as:  CELEBREX TAKE 1 CAPSULE (200 MG TOTAL) BY MOUTH DAILY. WITH FOOD. FOR JOINT PAIN   clobetasol 0.05 % external solution Commonly known as:  TEMOVATE APPLY 2-3 DROPS TO AFFECTED AREAS OF SCALP TWICE A DAY   donepezil 10 MG tablet Commonly known as:  ARICEPT Take 1/2 tablet daily for 1 month, then increase to 1 tablet daily   ELIQUIS 5 MG Tabs tablet Generic drug:  apixaban TAKE 1 TABLET TWICE A DAY   fish oil-omega-3 fatty acids 1000 MG capsule Take 1 g by mouth daily.   fluocinolone 0.01 % cream Commonly known as:  VANOS Apply 1 application topically daily as needed (for skin).   furosemide 20 MG tablet Commonly known as:  LASIX Take 2 tablets (40 mg total) by mouth daily.  isosorbide mononitrate 30 MG 24 hr tablet Commonly known as:  IMDUR TAKE 1 TABLET BY MOUTH ONCE A DAY   ketoconazole 2 % shampoo Commonly known as:  NIZORAL USE 2-3 TIMES WEEKLY. LEAVE ON FOR 5 MIN. RINSE.   KLOR-CON M20 20 MEQ tablet Generic drug:  potassium chloride SA TAKE 1 TABLET (20 MEQ TOTAL) BY MOUTH DAILY.   loratadine 10 MG tablet Commonly known as:  CLARITIN TAKE 1 TABLET (10 MG TOTAL) BY MOUTH DAILY.   Melatonin 5 MG Tabs Take 5 mg by mouth at bedtime.   metoprolol tartrate 25 MG tablet Commonly known as:  LOPRESSOR TAKE 1 TABLET (25 MG TOTAL) BY MOUTH 2 (TWO) TIMES DAILY.   pantoprazole 20 MG tablet Commonly known as:  PROTONIX Take 20 mg by mouth daily as needed  for heartburn or indigestion.   PRESERVISION AREDS PO Take 1 tablet by mouth daily.   rosuvastatin 5 MG tablet Commonly known as:  CRESTOR TAKE 1 TABLET BY MOUTH EVERY DAY   tamsulosin 0.4 MG Caps capsule Commonly known as:  FLOMAX Take 1 capsule (0.4 mg total) by mouth at bedtime.   triamcinolone cream 0.1 % Commonly known as:  KENALOG Apply 1 application topically 2 (two) times daily. Apply to AA of trunk, arms, legs. Avoid underarms, genitals, face, groin.   TURMERIC CURCUMIN PO Take 1 tablet by mouth daily.      I believe his recent confusion episode is based on his getting acclimated to area separate. Unless there is a change dramatically for the worse we need to give this some time to play itself out. Otherwise he may just be progression of his Alzheimer's. There is no evidence of acute condition contributing at this time.  Follow-up: Return in about 1 month (around 10/05/2016).  [name], M.D.

## 2016-09-06 LAB — CMP14+EGFR
ALK PHOS: 48 IU/L (ref 39–117)
ALT: 11 IU/L (ref 0–44)
AST: 13 IU/L (ref 0–40)
Albumin/Globulin Ratio: 1.8 (ref 1.2–2.2)
Albumin: 4 g/dL (ref 3.5–4.7)
BILIRUBIN TOTAL: 0.7 mg/dL (ref 0.0–1.2)
BUN/Creatinine Ratio: 23 (ref 10–24)
BUN: 19 mg/dL (ref 8–27)
CHLORIDE: 102 mmol/L (ref 96–106)
CO2: 25 mmol/L (ref 18–29)
Calcium: 9.2 mg/dL (ref 8.6–10.2)
Creatinine, Ser: 0.83 mg/dL (ref 0.76–1.27)
GFR calc Af Amer: 91 mL/min/{1.73_m2} (ref >59)
GFR calc non Af Amer: 79 mL/min/{1.73_m2} (ref >59)
GLUCOSE: 96 mg/dL (ref 65–99)
Globulin, Total: 2.2 g/dL (ref 1.5–4.5)
Potassium: 4.4 mmol/L (ref 3.5–5.2)
Sodium: 143 mmol/L (ref 134–144)
TOTAL PROTEIN: 6.2 g/dL (ref 6.0–8.5)

## 2016-09-06 LAB — AMMONIA: Ammonia: 25 ug/dL — ABNORMAL LOW (ref 27–102)

## 2016-09-06 LAB — CBC WITH DIFFERENTIAL/PLATELET
BASOS ABS: 0 10*3/uL (ref 0.0–0.2)
Basos: 0 %
EOS (ABSOLUTE): 0.3 10*3/uL (ref 0.0–0.4)
Eos: 4 %
Hematocrit: 37.4 % — ABNORMAL LOW (ref 37.5–51.0)
Hemoglobin: 12.4 g/dL — ABNORMAL LOW (ref 13.0–17.7)
IMMATURE GRANS (ABS): 0 10*3/uL (ref 0.0–0.1)
Immature Granulocytes: 0 %
LYMPHS: 23 %
Lymphocytes Absolute: 1.7 10*3/uL (ref 0.7–3.1)
MCH: 33 pg (ref 26.6–33.0)
MCHC: 33.2 g/dL (ref 31.5–35.7)
MCV: 100 fL — ABNORMAL HIGH (ref 79–97)
MONOS ABS: 0.8 10*3/uL (ref 0.1–0.9)
Monocytes: 11 %
NEUTROS ABS: 4.6 10*3/uL (ref 1.4–7.0)
Neutrophils: 62 %
PLATELETS: 189 10*3/uL (ref 150–379)
RBC: 3.76 x10E6/uL — ABNORMAL LOW (ref 4.14–5.80)
RDW: 14 % (ref 12.3–15.4)
WBC: 7.4 10*3/uL (ref 3.4–10.8)

## 2016-09-06 LAB — LIPID PANEL
CHOLESTEROL TOTAL: 128 mg/dL (ref 100–199)
Chol/HDL Ratio: 2.4 ratio (ref 0.0–5.0)
HDL: 54 mg/dL (ref >39)
LDL Calculated: 64 mg/dL (ref 0–99)
TRIGLYCERIDES: 51 mg/dL (ref 0–149)
VLDL CHOLESTEROL CAL: 10 mg/dL (ref 5–40)

## 2016-09-11 DIAGNOSIS — H353122 Nonexudative age-related macular degeneration, left eye, intermediate dry stage: Secondary | ICD-10-CM | POA: Diagnosis not present

## 2016-09-18 ENCOUNTER — Ambulatory Visit (INDEPENDENT_AMBULATORY_CARE_PROVIDER_SITE_OTHER): Payer: Medicare Other | Admitting: *Deleted

## 2016-09-18 ENCOUNTER — Telehealth: Payer: Self-pay | Admitting: Cardiology

## 2016-09-18 DIAGNOSIS — I495 Sick sinus syndrome: Secondary | ICD-10-CM | POA: Diagnosis not present

## 2016-09-18 NOTE — Progress Notes (Signed)
Remote pacemaker transmission.   

## 2016-09-18 NOTE — Telephone Encounter (Signed)
Confirmed remote transmission w/ pt son.    

## 2016-09-19 ENCOUNTER — Encounter: Payer: Self-pay | Admitting: Cardiology

## 2016-09-19 LAB — CUP PACEART REMOTE DEVICE CHECK
Battery Voltage: 3.01 V
Brady Statistic AP VP Percent: 84.37 %
Brady Statistic AP VS Percent: 8.21 %
Brady Statistic AS VP Percent: 0.01 %
Brady Statistic AS VS Percent: 7.42 %
Brady Statistic RV Percent Paced: 84.36 %
Date Time Interrogation Session: 20180502171627
Implantable Lead Implant Date: 20170703
Implantable Lead Location: 753860
Implantable Pulse Generator Implant Date: 20170703
Lead Channel Impedance Value: 304 Ohm
Lead Channel Impedance Value: 304 Ohm
Lead Channel Impedance Value: 399 Ohm
Lead Channel Impedance Value: 456 Ohm
Lead Channel Pacing Threshold Amplitude: 0.875 V
Lead Channel Pacing Threshold Amplitude: 1.25 V
Lead Channel Pacing Threshold Pulse Width: 0.4 ms
Lead Channel Sensing Intrinsic Amplitude: 7.125 mV
Lead Channel Sensing Intrinsic Amplitude: 7.125 mV
Lead Channel Setting Pacing Amplitude: 2.5 V
Lead Channel Setting Sensing Sensitivity: 0.9 mV
MDC IDC LEAD IMPLANT DT: 20170703
MDC IDC LEAD LOCATION: 753859
MDC IDC MSMT BATTERY REMAINING LONGEVITY: 89 mo
MDC IDC MSMT LEADCHNL RA PACING THRESHOLD PULSEWIDTH: 0.4 ms
MDC IDC MSMT LEADCHNL RA SENSING INTR AMPL: 3.375 mV
MDC IDC MSMT LEADCHNL RA SENSING INTR AMPL: 3.375 mV
MDC IDC SET LEADCHNL RA PACING AMPLITUDE: 1.75 V
MDC IDC SET LEADCHNL RV PACING PULSEWIDTH: 0.4 ms
MDC IDC STAT BRADY RA PERCENT PACED: 92.5 %

## 2016-09-25 ENCOUNTER — Other Ambulatory Visit: Payer: Self-pay | Admitting: Family Medicine

## 2016-10-07 ENCOUNTER — Encounter: Payer: Self-pay | Admitting: Family Medicine

## 2016-10-07 ENCOUNTER — Ambulatory Visit (INDEPENDENT_AMBULATORY_CARE_PROVIDER_SITE_OTHER): Payer: Medicare Other | Admitting: Family Medicine

## 2016-10-07 VITALS — BP 137/77 | HR 61 | Ht 64.0 in | Wt 177.0 lb

## 2016-10-07 DIAGNOSIS — F0390 Unspecified dementia without behavioral disturbance: Secondary | ICD-10-CM | POA: Diagnosis not present

## 2016-10-07 DIAGNOSIS — I25118 Atherosclerotic heart disease of native coronary artery with other forms of angina pectoris: Secondary | ICD-10-CM

## 2016-10-07 DIAGNOSIS — I480 Paroxysmal atrial fibrillation: Secondary | ICD-10-CM | POA: Diagnosis not present

## 2016-10-07 DIAGNOSIS — F03A0 Unspecified dementia, mild, without behavioral disturbance, psychotic disturbance, mood disturbance, and anxiety: Secondary | ICD-10-CM

## 2016-10-07 DIAGNOSIS — I10 Essential (primary) hypertension: Secondary | ICD-10-CM | POA: Diagnosis not present

## 2016-10-07 DIAGNOSIS — R3989 Other symptoms and signs involving the genitourinary system: Secondary | ICD-10-CM | POA: Diagnosis not present

## 2016-10-07 DIAGNOSIS — E782 Mixed hyperlipidemia: Secondary | ICD-10-CM | POA: Diagnosis not present

## 2016-10-07 MED ORDER — FEXOFENADINE HCL 180 MG PO TABS: 180.0000 mg | ORAL_TABLET | Freq: Every day | ORAL | 3 refills | Status: DC

## 2016-10-07 MED ORDER — TAMSULOSIN HCL 0.4 MG PO CAPS: 0.8000 mg | ORAL_CAPSULE | Freq: Every day | ORAL | 3 refills | Status: DC

## 2016-10-07 NOTE — Progress Notes (Signed)
Subjective:  Patient ID: Brad [name], male    DOB: 09-13-1927  Age: 83 y.o. MRN: [id number]  CC: Follow-up (pt here today for routine follow up and he also is c/o "hernias bothering him")   HPI Brad [name] presents for Meds agreeing with him but she's taken a lot of them. He says there is one little pill the cone bubbles back up in his throat. He's also having to get up a lot to go to the bathroom at night. Concerned about polypharmacy taking 18 pills a day. Some of that is Tylenol which she takes for his arthritis. Reports nocturia of 3-4 times a night sometimes. Only taking tamsulosin 1 at bedtime. The donepezil seems to be agreeing with him well. He says his mind is clear although he notes himself that he is having occasional confusion and that his memory is not very good. Son is here and gives history. Says the patient is much better he's had no spells like he had last month to see that note. (Reviewed in detail). The son is concerned about his polypharmacy but after discussion agrees that everything seems to have a purpose and is appropriate and not causing problems. The hernias mentioned above are internal. He has braces for it. She does not desire nor is he a candidate for surgery. He has been getting those at a medical supply Center in [place]. He has a newer one that he put aside for an old one last week and relates that has having caused the problem. He is gone back to the new or 1 and the problems already better.   History Brad [name] has a past medical history of Ascending aortic aneurysm ([place]); CAD (coronary artery disease); Chronic lower back pain; COPD (chronic obstructive pulmonary disease) (HCC); DJD (degenerative joint disease); Enlarged prostate; Ethanolism ([place]); GERD (gastroesophageal reflux disease); Hernia, abdominal; HLD (hyperlipidemia); HTN (hypertension); Macular degeneration; PAD (peripheral artery disease) ([place]); Paroxysmal atrial fibrillation ([place]); Psoriasis; S/P AAA  repair; Sinus node dysfunction ([place]); Tobacco abuse; and Unilateral congenital absence of kidney.   He has a past surgical history that includes Appendectomy; RESECTION AND GRAFTING OF INFARENAL  ABDOMINAL  AORTIC ANEURYSM (05/03/2002); Carpal tunnel release (Right, 12/26/2014); Insert / replace / remove pacemaker (11/20/2015); Hernia repair; Cataract extraction, bilateral (Bilateral); Coronary angioplasty with stent; and Cardiac catheterization (N/A, 11/20/2015).   His family history includes COPD in his brother and sister; Cancer in his brother, mother, and sister; Early death in his sister; Stroke in his mother.He reports that he quit smoking about 20 years ago. His smoking use included Cigarettes. He has a 130.00 pack-year smoking history. He has never used smokeless tobacco. He reports that he drinks alcohol. He reports that he does not use drugs.    ROS Review of Systems  Constitutional: Negative for chills, diaphoresis, fever and unexpected weight change.  HENT: Negative for congestion, hearing loss, rhinorrhea and sore throat.   Eyes: Negative for visual disturbance.  Respiratory: Negative for cough and shortness of breath.   Cardiovascular: Negative for chest pain.  Gastrointestinal: Negative for abdominal pain, constipation and diarrhea.  Genitourinary: Positive for frequency (primarily nocturia). Negative for dysuria and flank pain.  Musculoskeletal: Negative for arthralgias and joint swelling.  Skin: Negative for rash.  Neurological: Negative for dizziness and headaches.  Psychiatric/Behavioral: Positive for confusion (Getting much better). Negative for dysphoric mood and sleep disturbance. The patient is not nervous/anxious.     Objective:  BP (!) 142/79   Pulse 61   Ht 5'  4" (1.626 m)   Wt 177 lb (80.3 kg)   BMI 30.38 kg/m   BP Readings from Last 3 Encounters:  10/07/16 (!) 142/79  09/05/16 107/65  09/03/16 140/73    Wt Readings from Last 3 Encounters:  10/07/16 177 lb  (80.3 kg)  09/05/16 181 lb 8 oz (82.3 kg)  09/03/16 180 lb (81.6 kg)     Physical Exam  Cardiovascular: Normal rate and regular rhythm.       Assessment & Plan:   Brad [name] was seen today for follow-up.  Diagnoses and all orders for this visit:  Mild dementia  Urine troubles -     tamsulosin (FLOMAX) 0.4 MG CAPS capsule; Take 2 capsules (0.8 mg total) by mouth at bedtime.  Essential hypertension  Mixed hyperlipidemia  Paroxysmal atrial fibrillation (HCC)  Other orders -     fexofenadine (ALLEGRA) 180 MG tablet; Take 1 tablet (180 mg total) by mouth daily. For allergy symptoms       I have discontinued Brad [name]'s Melatonin and loratadine. I have also changed his tamsulosin. Additionally, I am having him start on fexofenadine. Lastly, I am having him maintain his fish oil-omega-3 fatty acids, TURMERIC CURCUMIN PO, Multiple Vitamins-Minerals (PRESERVISION AREDS PO), triamcinolone cream, fluocinolone, albuterol, pantoprazole, acetaminophen, clobetasol, furosemide, bacitracin, isosorbide mononitrate, KLOR-CON M20, ketoconazole, donepezil, rosuvastatin, ELIQUIS, celecoxib, and metoprolol tartrate.  Allergies as of 10/07/2016      Reactions   Aspirin Shortness Of Breath, Swelling   Atorvastatin Other (See Comments)   Leg pain, feet pain   Cephalexin Other (See Comments)   unknown      Medication List       Accurate as of 10/07/16  9:46 AM. Always use your most recent med list.          acetaminophen 500 MG chewable tablet Commonly known as:  TYLENOL Chew 500 mg by mouth every 6 (six) hours as needed for pain.   albuterol 108 (90 Base) MCG/ACT inhaler Commonly known as:  PROVENTIL HFA;VENTOLIN HFA Inhale 2 puffs into the lungs every 6 (six) hours as needed for wheezing or shortness of breath.   bacitracin ointment Apply 1 application topically 2 (two) times daily.   celecoxib 200 MG capsule Commonly known as:  CELEBREX TAKE 1 CAPSULE (200 MG TOTAL) BY MOUTH  DAILY. WITH FOOD. FOR JOINT PAIN   clobetasol 0.05 % external solution Commonly known as:  TEMOVATE APPLY 2-3 DROPS TO AFFECTED AREAS OF SCALP TWICE A DAY   donepezil 10 MG tablet Commonly known as:  ARICEPT Take 1/2 tablet daily for 1 month, then increase to 1 tablet daily   ELIQUIS 5 MG Tabs tablet Generic drug:  apixaban TAKE 1 TABLET TWICE A DAY   fexofenadine 180 MG tablet Commonly known as:  ALLEGRA Take 1 tablet (180 mg total) by mouth daily. For allergy symptoms   fish oil-omega-3 fatty acids 1000 MG capsule Take 1 g by mouth daily.   fluocinolone 0.01 % cream Commonly known as:  VANOS Apply 1 application topically daily as needed (for skin).   furosemide 20 MG tablet Commonly known as:  LASIX Take 2 tablets (40 mg total) by mouth daily.   isosorbide mononitrate 30 MG 24 hr tablet Commonly known as:  IMDUR TAKE 1 TABLET BY MOUTH ONCE A DAY   ketoconazole 2 % shampoo Commonly known as:  NIZORAL USE 2-3 TIMES WEEKLY. LEAVE ON FOR 5 MIN. RINSE.   KLOR-CON M20 20 MEQ tablet Generic drug:  potassium chloride SA TAKE  1 TABLET (20 MEQ TOTAL) BY MOUTH DAILY.   metoprolol tartrate 25 MG tablet Commonly known as:  LOPRESSOR TAKE 1 TABLET (25 MG TOTAL) BY MOUTH 2 (TWO) TIMES DAILY.   pantoprazole 20 MG tablet Commonly known as:  PROTONIX Take 20 mg by mouth daily as needed for heartburn or indigestion.   PRESERVISION AREDS PO Take 1 tablet by mouth daily.   rosuvastatin 5 MG tablet Commonly known as:  CRESTOR TAKE 1 TABLET BY MOUTH EVERY DAY   tamsulosin 0.4 MG Caps capsule Commonly known as:  FLOMAX Take 2 capsules (0.8 mg total) by mouth at bedtime.   triamcinolone cream 0.1 % Commonly known as:  KENALOG Apply 1 application topically 2 (two) times daily. Apply to AA of trunk, arms, legs. Avoid underarms, genitals, face, groin.   TURMERIC CURCUMIN PO Take 1 tablet by mouth daily.      We'll contact his cardiologist Dr. Harrington Challenger. After discussion there  seems to be a purpose ongoing for each of his medications. I'm reluctant to make changes. However, the isosorbide seems to be duplicating activity that can also be performed by metoprolol. We'll see if she is okay with eliminating isosorbide.  Follow-up: Return in about 3 months (around 01/07/2017).  [name], M.D.

## 2016-10-10 ENCOUNTER — Other Ambulatory Visit: Payer: Self-pay | Admitting: Family Medicine

## 2016-10-15 ENCOUNTER — Telehealth: Payer: Self-pay | Admitting: Internal Medicine

## 2016-10-15 NOTE — Telephone Encounter (Signed)
Spoke with Tim Lair (Pts son on DPR) who was wondering where the stent was placed in his heart. I looked back in patients medical history and explained to Jori Moll while looking at the MDs notes it was in the second obtuse marginal artery which branches off the left circumflex artery. Advised [name] to pull up a diagram of the heart if he has access to the internet so he can look at a visual and have a better understanding on exactly where the OM2 is located. Explained to [name] that the Coronary arteries supply the heart muscle with blood and oxygen. [name] was asking if that is where the aneurysm was located. Looking in patient's history, the aneurysm was located in the abdominal aorta which was explained to him. [name] thanked me for clarifying this with him and that he is no longer confused. He verbalized understanding and thanke dme for my call.

## 2016-10-15 NOTE — Telephone Encounter (Signed)
Mr. Denbleyker is calling to find out where is the stent placed on his father . Please call

## 2016-10-20 NOTE — Telephone Encounter (Signed)
Brad [name], this message came to me.  Not sure why.

## 2016-10-24 ENCOUNTER — Other Ambulatory Visit: Payer: Self-pay | Admitting: Family Medicine

## 2016-11-08 ENCOUNTER — Ambulatory Visit (INDEPENDENT_AMBULATORY_CARE_PROVIDER_SITE_OTHER): Payer: Medicare Other | Admitting: Family Medicine

## 2016-11-08 ENCOUNTER — Encounter: Payer: Self-pay | Admitting: Family Medicine

## 2016-11-08 VITALS — BP 130/77 | HR 66 | Temp 98.1°F | Ht 64.0 in | Wt 180.0 lb

## 2016-11-08 DIAGNOSIS — R3989 Other symptoms and signs involving the genitourinary system: Secondary | ICD-10-CM

## 2016-11-08 DIAGNOSIS — N400 Enlarged prostate without lower urinary tract symptoms: Secondary | ICD-10-CM | POA: Diagnosis not present

## 2016-11-08 DIAGNOSIS — N3090 Cystitis, unspecified without hematuria: Secondary | ICD-10-CM | POA: Diagnosis not present

## 2016-11-08 DIAGNOSIS — I25118 Atherosclerotic heart disease of native coronary artery with other forms of angina pectoris: Secondary | ICD-10-CM

## 2016-11-08 DIAGNOSIS — R399 Unspecified symptoms and signs involving the genitourinary system: Secondary | ICD-10-CM | POA: Diagnosis not present

## 2016-11-08 DIAGNOSIS — R319 Hematuria, unspecified: Secondary | ICD-10-CM

## 2016-11-08 LAB — URINALYSIS, COMPLETE
BILIRUBIN UA: NEGATIVE
GLUCOSE, UA: NEGATIVE
Leukocytes, UA: NEGATIVE
Nitrite, UA: NEGATIVE
Specific Gravity, UA: 1.025 (ref 1.005–1.030)
UUROB: 0.2 mg/dL (ref 0.2–1.0)
pH, UA: 5.5 (ref 5.0–7.5)

## 2016-11-08 LAB — MICROSCOPIC EXAMINATION
RBC, UA: >30 /hpf — AB (ref 0–?)
Renal Epithel, UA: NONE SEEN /hpf

## 2016-11-08 MED ORDER — TAMSULOSIN HCL 0.4 MG PO CAPS: 0.8000 mg | ORAL_CAPSULE | Freq: Every day | ORAL | 3 refills | Status: DC

## 2016-11-08 MED ORDER — CIPROFLOXACIN HCL 500 MG PO TABS: 500.0000 mg | ORAL_TABLET | Freq: Two times a day (BID) | ORAL | 0 refills | Status: DC

## 2016-11-08 NOTE — Progress Notes (Signed)
Subjective:  Patient ID: Brad [name], male    DOB: [birth year]  Age: 83 y.o. MRN: [id number]  CC: Urinary Retention (pt here today c/o having "trouble using the bathroom" he states he feels like he needs to go but then he has to push harder to get the stream started and it stops midstream sometimes.)   HPI Brad [name] presents for Frequency of urination. He is going several times overnight. However he will start to go and then his flow. And he will have trouble getting started again. Sometimes he feels the need to go and can't. He has been taking tamsulosin 0.4 mg 1 tablet at bedtime. No burning with urination noted.   History Brad [name] has a past medical history of Ascending aortic aneurysm (Brad [name]); CAD (coronary artery disease); Chronic lower back pain; COPD (chronic obstructive pulmonary disease) (HCC); DJD (degenerative joint disease); Enlarged prostate; Ethanolism (Brad [name]); GERD (gastroesophageal reflux disease); Hernia, abdominal; HLD (hyperlipidemia); HTN (hypertension); Macular degeneration; PAD (peripheral artery disease) (Brad [name]); Paroxysmal atrial fibrillation (Brad [name]); Psoriasis; S/P AAA repair; Sinus node dysfunction (Brad [name]); Tobacco abuse; and Unilateral congenital absence of kidney.   He has a past surgical history that includes Appendectomy; RESECTION AND GRAFTING OF INFARENAL  ABDOMINAL  AORTIC ANEURYSM (05/03/2002); Carpal tunnel release (Right, 12/26/2014); Insert / replace / remove pacemaker (11/20/2015); Hernia repair; Cataract extraction, bilateral (Bilateral); Coronary angioplasty with stent; and Cardiac catheterization (N/A, 11/20/2015).   His family history includes COPD in his brother and sister; Cancer in his brother, mother, and sister; Early death in his sister; Stroke in his mother.He reports that he quit smoking about 20 years ago. His smoking use included Cigarettes. He has a 130.00 pack-year smoking history. He has never used smokeless tobacco. He reports that he drinks alcohol. He  reports that he does not use drugs.    ROS Review of Systems  Noncontributory except as per history of present illness  Objective:  BP 130/77   Pulse 66   Temp 98.1 F (36.7 C) (Oral)   Ht 5\' 4"  (1.626 m)   Wt 180 lb (81.6 kg)   BMI 30.90 kg/m   BP Readings from Last 3 Encounters:  11/08/16 130/77  10/07/16 137/77  09/05/16 107/65    Wt Readings from Last 3 Encounters:  11/08/16 180 lb (81.6 kg)  10/07/16 177 lb (80.3 kg)  09/05/16 181 lb 8 oz (82.3 kg)     Physical Exam  Constitutional: He is oriented to person, place, and time. He appears well-developed and well-nourished.  HENT:  Head: Normocephalic and atraumatic.  Right Ear: External ear normal.  Left Ear: External ear normal.  Mouth/Throat: No oropharyngeal exudate or posterior oropharyngeal erythema.  Eyes: Pupils are equal, round, and reactive to light.  Neck: Normal range of motion. Neck supple.  Cardiovascular: Normal rate and regular rhythm.   No murmur heard. Pulmonary/Chest: Breath sounds normal. No respiratory distress.  Neurological: He is alert and oriented to person, place, and time.  Vitals reviewed.     Assessment & Plan:   Declan was seen today for urinary retention.  Diagnoses and all orders for this visit:  UTI symptoms -     Urinalysis, Complete  Benign prostatic hyperplasia without lower urinary tract symptoms  Cystitis  Hematuria, unspecified type  Urine troubles -     tamsulosin (FLOMAX) 0.4 MG CAPS capsule; Take 2 capsules (0.8 mg total) by mouth at bedtime.  Other orders -     ciprofloxacin (CIPRO) 500 MG tablet; Take 1 tablet (500  mg total) by mouth 2 (two) times daily. For prostate. Take all of these.    Patient should bring in a urine sample in a couple of weeks to make sure that the blood has cleared. He should also let me know if the symptoms do not improve with the increase of the tamsulosin and the course of ciprofloxacin. In which case he will need to be seen  by urology.   I have discontinued [name]'s Multiple Vitamins-Minerals (PRESERVISION AREDS PO) and pantoprazole. I am also having him start on ciprofloxacin. Additionally, I am having him maintain his fish oil-omega-3 fatty acids, TURMERIC CURCUMIN PO, triamcinolone cream, fluocinolone, albuterol, acetaminophen, clobetasol, furosemide, bacitracin, isosorbide mononitrate, KLOR-CON M20, ketoconazole, donepezil, ELIQUIS, metoprolol tartrate, fexofenadine, celecoxib, rosuvastatin, acetaminophen, and tamsulosin.  Allergies as of 11/08/2016      Reactions   Aspirin Shortness Of Breath, Swelling   Atorvastatin Other (See Comments)   Leg pain, feet pain   Cephalexin Other (See Comments)   unknown      Medication List       Accurate as of 11/08/16  5:32 PM. Always use your most recent med list.          acetaminophen 500 MG chewable tablet Commonly known as:  TYLENOL Chew 500 mg by mouth every 6 (six) hours as needed for pain.   TYLENOL ARTHRITIS PAIN 650 MG CR tablet Generic drug:  acetaminophen Take 650 mg by mouth every 8 (eight) hours as needed for pain.   albuterol 108 (90 Base) MCG/ACT inhaler Commonly known as:  PROVENTIL HFA;VENTOLIN HFA Inhale 2 puffs into the lungs every 6 (six) hours as needed for wheezing or shortness of breath.   bacitracin ointment Apply 1 application topically 2 (two) times daily.   celecoxib 200 MG capsule Commonly known as:  CELEBREX TAKE 1 CAPSULE (200 MG TOTAL) BY MOUTH DAILY. WITH FOOD. FOR JOINT PAIN   ciprofloxacin 500 MG tablet Commonly known as:  CIPRO Take 1 tablet (500 mg total) by mouth 2 (two) times daily. For prostate. Take all of these.   clobetasol 0.05 % external solution Commonly known as:  TEMOVATE APPLY 2-3 DROPS TO AFFECTED AREAS OF SCALP TWICE A DAY   donepezil 10 MG tablet Commonly known as:  ARICEPT Take 1/2 tablet daily for 1 month, then increase to 1 tablet daily   ELIQUIS 5 MG Tabs tablet Generic drug:   apixaban TAKE 1 TABLET TWICE A DAY   fexofenadine 180 MG tablet Commonly known as:  ALLEGRA Take 1 tablet (180 mg total) by mouth daily. For allergy symptoms   fish oil-omega-3 fatty acids 1000 MG capsule Take 1 g by mouth daily.   fluocinolone 0.01 % cream Commonly known as:  VANOS Apply 1 application topically daily as needed (for skin).   furosemide 20 MG tablet Commonly known as:  LASIX Take 2 tablets (40 mg total) by mouth daily.   isosorbide mononitrate 30 MG 24 hr tablet Commonly known as:  IMDUR TAKE 1 TABLET BY MOUTH ONCE A DAY   ketoconazole 2 % shampoo Commonly known as:  NIZORAL USE 2-3 TIMES WEEKLY. LEAVE ON FOR 5 MIN. RINSE.   KLOR-CON M20 20 MEQ tablet Generic drug:  potassium chloride SA TAKE 1 TABLET (20 MEQ TOTAL) BY MOUTH DAILY.   metoprolol tartrate 25 MG tablet Commonly known as:  LOPRESSOR TAKE 1 TABLET (25 MG TOTAL) BY MOUTH 2 (TWO) TIMES DAILY.   rosuvastatin 5 MG tablet Commonly known as:  CRESTOR TAKE 1 TABLET  BY MOUTH EVERY DAY   tamsulosin 0.4 MG Caps capsule Commonly known as:  FLOMAX Take 2 capsules (0.8 mg total) by mouth at bedtime.   triamcinolone cream 0.1 % Commonly known as:  KENALOG Apply 1 application topically 2 (two) times daily. Apply to AA of trunk, arms, legs. Avoid underarms, genitals, face, groin.   TURMERIC CURCUMIN PO Take 1 tablet by mouth daily.        Follow-up: No Follow-up on file.  [name], M.D.

## 2016-11-10 ENCOUNTER — Other Ambulatory Visit: Payer: Self-pay | Admitting: Family Medicine

## 2016-11-20 ENCOUNTER — Other Ambulatory Visit: Payer: Self-pay | Admitting: Family Medicine

## 2016-11-26 MED ORDER — METOPROLOL TARTRATE 25 MG PO TABS: 25.0000 mg | ORAL_TABLET | Freq: Two times a day (BID) | ORAL | 0 refills | Status: DC

## 2016-11-26 NOTE — Addendum Note (Signed)
Addended by: Antonietta Barcelona D on: 11/26/2016 01:31 PM   Modules accepted: Orders

## 2016-12-02 ENCOUNTER — Other Ambulatory Visit: Payer: Medicare Other

## 2016-12-02 DIAGNOSIS — R399 Unspecified symptoms and signs involving the genitourinary system: Secondary | ICD-10-CM | POA: Diagnosis not present

## 2016-12-02 LAB — URINALYSIS, COMPLETE
BILIRUBIN UA: NEGATIVE
Glucose, UA: NEGATIVE
KETONES UA: NEGATIVE
LEUKOCYTES UA: NEGATIVE
NITRITE UA: NEGATIVE
PH UA: 5 (ref 5.0–7.5)
Protein, UA: NEGATIVE
SPEC GRAV UA: 1.01 (ref 1.005–1.030)
Urobilinogen, Ur: 0.2 mg/dL (ref 0.2–1.0)

## 2016-12-02 LAB — MICROSCOPIC EXAMINATION
Bacteria, UA: NONE SEEN
Epithelial Cells (non renal): NONE SEEN /hpf (ref 0–10)
Renal Epithel, UA: NONE SEEN /hpf
WBC UA: NONE SEEN /HPF (ref 0–?)

## 2016-12-03 ENCOUNTER — Encounter: Payer: Self-pay | Admitting: *Deleted

## 2016-12-04 ENCOUNTER — Other Ambulatory Visit: Payer: Self-pay | Admitting: *Deleted

## 2016-12-04 DIAGNOSIS — R319 Hematuria, unspecified: Secondary | ICD-10-CM | POA: Insufficient documentation

## 2016-12-04 LAB — URINE CULTURE

## 2016-12-05 ENCOUNTER — Other Ambulatory Visit: Payer: Self-pay | Admitting: Family Medicine

## 2016-12-17 ENCOUNTER — Other Ambulatory Visit: Payer: Self-pay | Admitting: Family Medicine

## 2016-12-17 ENCOUNTER — Ambulatory Visit (INDEPENDENT_AMBULATORY_CARE_PROVIDER_SITE_OTHER): Payer: Medicare Other | Admitting: Internal Medicine

## 2016-12-17 ENCOUNTER — Encounter: Payer: Self-pay | Admitting: Internal Medicine

## 2016-12-17 VITALS — BP 120/74 | HR 78 | Ht 64.0 in | Wt 180.4 lb

## 2016-12-17 DIAGNOSIS — R001 Bradycardia, unspecified: Secondary | ICD-10-CM | POA: Diagnosis not present

## 2016-12-17 DIAGNOSIS — I25118 Atherosclerotic heart disease of native coronary artery with other forms of angina pectoris: Secondary | ICD-10-CM

## 2016-12-17 DIAGNOSIS — I495 Sick sinus syndrome: Secondary | ICD-10-CM

## 2016-12-17 DIAGNOSIS — I10 Essential (primary) hypertension: Secondary | ICD-10-CM

## 2016-12-17 NOTE — Progress Notes (Signed)
HPI Brad [name] returns today for followup. He is a pleasant elderly man with a h/o syncope, s/p PPM insertion, and PAF. He has had a chronic ascending aneurysm. He denies chest pain or more syncope. His gait has become unstable. He is walking with a walker.  Allergies  Allergen Reactions  . Aspirin Shortness Of Breath and Swelling  . Atorvastatin Other (See Comments)    Leg pain, feet pain  . Cephalexin Other (See Comments)    unknown     Current Outpatient Prescriptions  Medication Sig Dispense Refill  . acetaminophen (TYLENOL ARTHRITIS PAIN) 650 MG CR tablet Take 650 mg by mouth every 8 (eight) hours as needed for pain.    Marland Kitchen acetaminophen (TYLENOL) 500 MG chewable tablet Chew 500 mg by mouth every 6 (six) hours as needed for pain.    Marland Kitchen albuterol (PROVENTIL HFA;VENTOLIN HFA) 108 (90 Base) MCG/ACT inhaler Inhale 2 puffs into the lungs every 6 (six) hours as needed for wheezing or shortness of breath. 1 Inhaler 2  . bacitracin ointment Apply 1 application topically 2 (two) times daily. 120 g 0  . celecoxib (CELEBREX) 200 MG capsule TAKE 1 CAPSULE (200 MG TOTAL) BY MOUTH DAILY. WITH FOOD. FOR JOINT PAIN 30 capsule 1  . clobetasol (TEMOVATE) 0.05 % external solution APPLY 2-3 DROPS TO AFFECTED AREAS OF SCALP TWICE A DAY 50 mL 2  . donepezil (ARICEPT) 10 MG tablet Take 1/2 tablet daily for 1 month, then increase to 1 tablet daily 30 tablet 11  . ELIQUIS 5 MG TABS tablet TAKE 1 TABLET TWICE A DAY 60 tablet 25  . fexofenadine (ALLEGRA) 180 MG tablet Take 1 tablet (180 mg total) by mouth daily. For allergy symptoms 90 tablet 3  . fish oil-omega-3 fatty acids 1000 MG capsule Take 1 g by mouth daily.      . fluocinolone (VANOS) 0.01 % cream Apply 1 application topically daily as needed (for skin).     . furosemide (LASIX) 20 MG tablet Take 2 tablets (40 mg total) by mouth daily. 60 tablet 11  . isosorbide mononitrate (IMDUR) 30 MG 24 hr tablet TAKE 1 TABLET BY MOUTH ONCE A DAY 30 tablet  4  . ketoconazole (NIZORAL) 2 % shampoo USE 2-3 TIMES WEEKLY. LEAVE ON FOR 5 MIN. RINSE. 120 mL 0  . KLOR-CON M20 20 MEQ tablet TAKE 1 TABLET (20 MEQ TOTAL) BY MOUTH DAILY. 90 tablet 0  . metoprolol tartrate (LOPRESSOR) 25 MG tablet Take 1 tablet (25 mg total) by mouth 2 (two) times daily. 180 tablet 0  . rosuvastatin (CRESTOR) 5 MG tablet TAKE 1 TABLET BY MOUTH EVERY DAY 90 tablet 0  . tamsulosin (FLOMAX) 0.4 MG CAPS capsule Take 2 capsules (0.8 mg total) by mouth at bedtime. 180 capsule 3  . triamcinolone cream (KENALOG) 0.1 % Apply 1 application topically 2 (two) times daily. Apply to AA of trunk, arms, legs. Avoid underarms, genitals, face, groin. 453.6 g 3  . TURMERIC CURCUMIN PO Take 1 tablet by mouth daily.     No current facility-administered medications for this visit.      Past Medical History:  Diagnosis Date  . Ascending aortic aneurysm (HCC)    Dr. Roxan Hockey  . CAD (coronary artery disease)    s/p stent to OM2 in past;  Last LHC 3/05: EF 60%, left RA occluded, right RA ok, prox to mid LAD 60%, oD1 75-80%, oD2 small 75%, pOM1 50%, OM2 stent ok with 50% before stent,  mRCA 30%, dRCA 30%, PDA 70%.  Last dobutamine myoview 5/06: no scar or ischemia, EF 60%.  . Chronic lower back pain   . COPD (chronic obstructive pulmonary disease) ([place])   . DJD (degenerative joint disease)   . Enlarged prostate   . Ethanolism ([place])   . GERD (gastroesophageal reflux disease)   . Hernia, abdominal    "he has ~ 4; wears binder" (11/20/2015)  . HLD (hyperlipidemia)   . HTN (hypertension)    Last echo 1/07:  Normal LVF, LV thickness upper limits of normal, mild aortic root dilatation, mild to mod MAC, mild LAE, borderline RVH, mild RAE.  . Macular degeneration   . PAD (peripheral artery disease) ([place])   . Paroxysmal atrial fibrillation (HCC)   . Psoriasis   . S/P AAA repair   . Sinus node dysfunction (HCC)    a. s/p MDT dual chamber PPM with His Bundle pacing  . Tobacco abuse   . Unilateral  congenital absence of kidney     ROS:   All systems reviewed and negative except as noted in the HPI.   Past Surgical History:  Procedure Laterality Date  . APPENDECTOMY    . CARPAL TUNNEL RELEASE Right 12/26/2014   Procedure: RIGHT ENDOSCOPIC CARPAL TUNNEL SYNDROME RELEASE;  Surgeon: Milly Jakob, MD;  Location: [facility];  Service: Orthopedics;  Laterality: Right;  . CATARACT EXTRACTION, BILATERAL Bilateral   . CORONARY ANGIOPLASTY WITH STENT PLACEMENT     s/p stent to OM2 in past;   . EP IMPLANTABLE DEVICE N/A 11/20/2015   Procedure: Pacemaker Implant;  Surgeon: Evans Lance, MD;  Location: [facility];  Service: Cardiovascular;  Laterality: N/A;  . HERNIA REPAIR    . INSERT / REPLACE / REMOVE PACEMAKER  11/20/2015  . RESECTION AND GRAFTING OF INFARENAL  ABDOMINAL  AORTIC ANEURYSM  05/03/2002     Family History  Problem Relation Age of Onset  . Cancer Mother        uterine  . Stroke Mother        hemi plagic  . Cancer Sister   . COPD Sister   . COPD Brother   . Cancer Brother        Lip  . Early death Sister      Social History   Social History  . Marital status: Widowed    Spouse name: N/A  . Number of children: N/A  . Years of education: N/A   Occupational History  . Not on file.   Social History Main Topics  . Smoking status: Former Smoker    Packs/day: 2.50    Years: 52.00    Types: Cigarettes    Quit date: 05/06/1996  . Smokeless tobacco: Never Used  . Alcohol use Yes     Comment: 11/20/2015 "used to drink heavily; quit 05/06/1996"  . Drug use: No  . Sexual activity: No   Other Topics Concern  . Not on file   Social History Narrative  . No narrative on file     BP 120/74   Pulse 78   Ht _0  (1.626 m)   Wt 180 lb 6.4 oz (81.8 kg)   SpO2 93%   BMI 30.97 kg/m   Physical Exam:  Well appearing NAD HEENT: Unremarkable Neck:  No JVD, no thyromegally Lymphatics:  No adenopathy Back:  No CVA tenderness Lungs:   Clear HEART:  Regular rate rhythm, no murmurs, no rubs, no clicks Abd:  soft, positive bowel  sounds, no organomegally, no rebound, no guarding Ext:  2 plus pulses, no edema, no cyanosis, no clubbing Skin:  No rashes no nodules Neuro:  CN II through XII intact, motor grossly intact  EKG - NSR with ventricular pacing  DEVICE  Normal device function.  See PaceArt for details.   Assess/Plan: 1. Symptomatic bradycardia - he is s/p PPM insertion and stable. No additional syncope. 2. His medtronic DDD PM is working normally. Will recheck in several months.  3. HTN - his blood pressure is well controlled. He will continue his current meds.  Mikle Bosworth.D.

## 2016-12-17 NOTE — Patient Instructions (Signed)
Medication Instructions:  Your physician recommends that you continue on your current medications as directed. Please refer to the Current Medication list given to you today.   Labwork: None ordered.   Testing/Procedures: None ordered.   Follow-Up: Your physician wants you to follow-up in: one year with Dr. Lovena Le.  You will receive a reminder letter in the mail two months in advance. If you don't receive a letter, please call our office to schedule the follow-up appointment.  Remote monitoring is used to monitor your Pacemaker from home. This monitoring reduces the number of office visits required to check your device to one time per year. It allows [place] to keep an eye on the functioning of your device to ensure it is working properly. You are scheduled for a device check from home on 12/18/2016. You may send your transmission at any time that day. If you have a wireless device, the transmission will be sent automatically. After your physician reviews your transmission, you will receive a postcard with your next transmission date.    Any Other Special Instructions Will Be Listed Below (If Applicable).     If you need a refill on your cardiac medications before your next appointment, please call your pharmacy.

## 2016-12-18 ENCOUNTER — Encounter: Payer: Medicare Other | Admitting: *Deleted

## 2016-12-18 ENCOUNTER — Telehealth: Payer: Self-pay | Admitting: Cardiology

## 2016-12-18 NOTE — Telephone Encounter (Signed)
Confirmed remote transmission w/ pt son.    

## 2016-12-24 LAB — CUP PACEART INCLINIC DEVICE CHECK
Battery Remaining Longevity: 86 mo
Battery Voltage: 3.01 V
Brady Statistic RA Percent Paced: 92.71 %
Implantable Lead Implant Date: 20170703
Implantable Lead Location: 753860
Implantable Lead Model: 5076
Implantable Pulse Generator Implant Date: 20170703
Lead Channel Impedance Value: 323 Ohm
Lead Channel Pacing Threshold Amplitude: 1 V
Lead Channel Pacing Threshold Pulse Width: 0.4 ms
Lead Channel Sensing Intrinsic Amplitude: 3 mV
Lead Channel Setting Pacing Amplitude: 2.5 V
Lead Channel Setting Pacing Pulse Width: 0.4 ms
Lead Channel Setting Sensing Sensitivity: 0.9 mV
MDC IDC LEAD IMPLANT DT: 20170703
MDC IDC LEAD LOCATION: 753859
MDC IDC MSMT LEADCHNL RA IMPEDANCE VALUE: 456 Ohm
MDC IDC MSMT LEADCHNL RA PACING THRESHOLD PULSEWIDTH: 0.4 ms
MDC IDC MSMT LEADCHNL RV IMPEDANCE VALUE: 304 Ohm
MDC IDC MSMT LEADCHNL RV IMPEDANCE VALUE: 399 Ohm
MDC IDC MSMT LEADCHNL RV PACING THRESHOLD AMPLITUDE: 1 V
MDC IDC MSMT LEADCHNL RV SENSING INTR AMPL: 8.625 mV
MDC IDC SESS DTM: 20180731150351
MDC IDC SET LEADCHNL RA PACING AMPLITUDE: 1.75 V
MDC IDC STAT BRADY AP VP PERCENT: 82.57 %
MDC IDC STAT BRADY AP VS PERCENT: 10.46 %
MDC IDC STAT BRADY AS VP PERCENT: 0.01 %
MDC IDC STAT BRADY AS VS PERCENT: 6.96 %
MDC IDC STAT BRADY RV PERCENT PACED: 82.46 %

## 2017-01-01 ENCOUNTER — Encounter: Payer: Self-pay | Admitting: Neurology

## 2017-01-01 ENCOUNTER — Ambulatory Visit (INDEPENDENT_AMBULATORY_CARE_PROVIDER_SITE_OTHER): Payer: Medicare Other | Admitting: Neurology

## 2017-01-01 VITALS — BP 142/60 | HR 85 | Ht 66.0 in | Wt 179.0 lb

## 2017-01-01 DIAGNOSIS — F03A0 Unspecified dementia, mild, without behavioral disturbance, psychotic disturbance, mood disturbance, and anxiety: Secondary | ICD-10-CM

## 2017-01-01 DIAGNOSIS — F0390 Unspecified dementia without behavioral disturbance: Secondary | ICD-10-CM | POA: Diagnosis not present

## 2017-01-01 DIAGNOSIS — I25118 Atherosclerotic heart disease of native coronary artery with other forms of angina pectoris: Secondary | ICD-10-CM | POA: Diagnosis not present

## 2017-01-01 MED ORDER — DONEPEZIL HCL 10 MG PO TABS: ORAL_TABLET | ORAL | 3 refills | Status: DC

## 2017-01-01 NOTE — Patient Instructions (Signed)
1. Continue Donepezil 10mg  daily 2. Continue to monitor driving 3. Follow-up in 6 months, call for any changes

## 2017-01-01 NOTE — Progress Notes (Signed)
NEUROLOGY FOLLOW UP OFFICE NOTE  Brad [name] [id number] May 12, 1928  HISTORY OF PRESENT ILLNESS: I had the pleasure of seeing Brad [name] in follow-up in the neurology clinic on 01/01/2017.  The patient was last seen 6 months ago for mild dementia. He is again accompanied by his son who helps supplement the history today.  Records and images were personally reviewed where available.  I personally reviewed head CT without contrast done 07/30/16 which did not show any acute changes, there was moderate global atrophy with ventricular prominence, chronic microvascular disease. He was started on Donepezil 45m daily which he is tolerating without side effects. For a time he was feeling dizzy when standing and had fallen a few times, went to the ER in March 2018, head CT no acute changes. He has not had any further bad falls, no loss of consciousness. He only occasionally feels dizzy upon standing now. He feels his memory is "not good." His son notes that he is not as confused as before. He continues to live alone, he drives and does [place] [address]. His son is in charge of bills and sets up his medications. He denies any headaches, vision changes, focal numbness/tingling/weakness. He has had blood in his urine and urinary frequency and has a follow-up with his urologist.   HPI 07/03/2016: This is a pleasant 83yo RH man with a history of hypertension, hyperlipidemia, CAD, sinus node dysfunction s/p PPM, paroxysmal atrial fibrillation, with worsening memory. He reports his memory is "terrible." He lives alone and states his sons have been helping him with bill payments and medications. His son has been noticing gradual memory changes over the past couple of years. He was concerned about his ability to keep up with his medications and took over a year ago. He took over [employer] year ago as well. He and his brother noticed that confusion is worse in the  evenings, he would get his phone and remote control confused, he would have trouble with his TV, or get confused as to where he is at. [place] His sons see him in the morning and evening for meals. His son has always worried about his driving, he has not seen him drive recklessly, but states he would miss a turn but be able to find his way home. The patient states he has gotten lost driving. He states he has left the stove on a few times. There have been some hallucinations where he sees and talks to his dead wife, he has both visual and auditory hallucinations of her. No personality changes.   He gets dizzy when standing and has fallen a few times. He has neck and back pain worse in the past 6 months. He had right carpal tunnel surgery 1-2 years ago and is not dropping his fork anymore, but continues to have numbness and tingling in that hand. He denies any headaches, diplopia, dysarthria/dysphagia, bowel/bladder dysfunction, anosmia. He has occasional mild tremors. Sleep is good, but son reports he occasionally gets up at 1-2am and thinks it is time to wake up. He has had a 10lb weight loss even with good appetite. No family history of dementia. He denies any history of significant head injuries. He stopped drinking alcohol 25 years ago (used to be a heavy drinker).   PAST MEDICAL HISTORY: Past Medical History:  Diagnosis Date  . Ascending  aortic aneurysm ([place])    Dr. Roxan Hockey  . CAD (coronary artery disease)    s/p stent to OM2 in past;  Last LHC 3/05: EF 60%, left RA occluded, right RA ok, prox to mid LAD 60%, oD1 75-80%, oD2 small 75%, pOM1 50%, OM2 stent ok with 50% before stent, mRCA 30%, dRCA 30%, PDA 70%.  Last dobutamine myoview 5/06: no scar or ischemia, EF 60%.  . Chronic lower back pain   . COPD (chronic obstructive pulmonary disease) ([place])   . DJD (degenerative joint disease)   . Enlarged prostate   . Ethanolism  ([place])   . GERD (gastroesophageal reflux disease)   . Hernia, abdominal    "he has ~ 4; wears binder" (11/20/2015)  . HLD (hyperlipidemia)   . HTN (hypertension)    Last echo 1/07:  Normal LVF, LV thickness upper limits of normal, mild aortic root dilatation, mild to mod MAC, mild LAE, borderline RVH, mild RAE.  . Macular degeneration   . PAD (peripheral artery disease) ([place])   . Paroxysmal atrial fibrillation (H[facility])   . Psoriasis   . S/P AAA repair   . Sinus node dysfunction ([facility])    a. s/p MDT dual chamber PPM with His Bundle pacing  . Tobacco abuse   . Unilateral congenital absence of kidney     MEDICATIONS: Current Outpatient Prescriptions on File Prior to Visit  Medication Sig Dispense Refill  . acetaminophen (TYLENOL ARTHRITIS PAIN) 650 MG CR tablet Take 650 mg by mouth every 8 (eight) hours as needed for pain.    Marland Kitchen acetaminophen (TYLENOL) 500 MG chewable tablet Chew 500 mg by mouth every 6 (six) hours as needed for pain.    Marland Kitchen albuterol (PROVENTIL HFA;VENTOLIN HFA) 108 (90 Base) MCG/ACT inhaler Inhale 2 puffs into the lungs every 6 (six) hours as needed for wheezing or shortness of breath. 1 Inhaler 2  . bacitracin ointment Apply 1 application topically 2 (two) times daily. 120 g 0  . celecoxib (CELEBREX) 200 MG capsule TAKE 1 CAPSULE (200 MG TOTAL) BY MOUTH DAILY. WITH FOOD. FOR JOINT PAIN 30 capsule 1  . clobetasol (TEMOVATE) 0.05 % external solution APPLY 2-3 DROPS TO AFFECTED AREAS OF SCALP TWICE A DAY 50 mL 2  . donepezil (ARICEPT) 10 MG tablet Take 1/2 tablet daily for 1 month, then increase to 1 tablet daily 30 tablet 11  . ELIQUIS 5 MG TABS tablet TAKE 1 TABLET TWICE A DAY 60 tablet 25  . fexofenadine (ALLEGRA) 180 MG tablet Take 1 tablet (180 mg total) by mouth daily. For allergy symptoms 90 tablet 3  . fish oil-omega-3 fatty acids 1000 MG capsule Take 1 g by mouth daily.      . fluocinolone (VANOS) 0.01 % cream Apply 1 application topically daily as needed (for skin).       . furosemide (LASIX) 20 MG tablet Take 2 tablets (40 mg total) by mouth daily. 60 tablet 11  . isosorbide mononitrate (IMDUR) 30 MG 24 hr tablet TAKE 1 TABLET BY MOUTH ONCE A DAY 30 tablet 4  . ketoconazole (NIZORAL) 2 % shampoo USE 2-3 TIMES WEEKLY. LEAVE ON FOR 5 MIN. RINSE. 120 mL 0  . KLOR-CON M20 20 MEQ tablet TAKE 1 TABLET (20 MEQ TOTAL) BY MOUTH DAILY. 90 tablet 0  . metoprolol tartrate (LOPRESSOR) 25 MG tablet Take 1 tablet (25 mg total) by mouth 2 (two) times daily. 180 tablet 0  . rosuvastatin (CRESTOR) 5 MG tablet TAKE 1 TABLET BY MOUTH EVERY  DAY 90 tablet 0  . tamsulosin (FLOMAX) 0.4 MG CAPS capsule Take 2 capsules (0.8 mg total) by mouth at bedtime. 180 capsule 3  . triamcinolone cream (KENALOG) 0.1 % Apply 1 application topically 2 (two) times daily. Apply to AA of trunk, arms, legs. Avoid underarms, genitals, face, groin. 453.6 g 3  . TURMERIC CURCUMIN PO Take 1 tablet by mouth daily.     No current facility-administered medications on file prior to visit.     ALLERGIES: Allergies  Allergen Reactions  . Aspirin Shortness Of Breath and Swelling  . Atorvastatin Other (See Comments)    Leg pain, feet pain  . Cephalexin Other (See Comments)    unknown    FAMILY HISTORY: Family History  Problem Relation Age of Onset  . Cancer Mother        uterine  . Stroke Mother        hemi plagic  . Cancer Sister   . COPD Sister   . COPD Brother   . Cancer Brother        Lip  . Early death Sister     SOCIAL HISTORY: Social History   Social History  . Marital status: Widowed    Spouse name: N/A  . Number of children: N/A  . Years of education: N/A   Occupational History  . Not on file.   Social History Main Topics  . Smoking status: Former Smoker    Packs/day: 2.50    Years: 52.00    Types: Cigarettes    Quit date: 05/06/1996  . Smokeless tobacco: Never Used  . Alcohol use Yes     Comment: 11/20/2015 "used to drink heavily; quit 05/06/1996"  . Drug use: No  .  Sexual activity: No   Other Topics Concern  . Not on file   Social History Narrative  . No narrative on file    REVIEW OF SYSTEMS: Constitutional: No fevers, chills, or sweats, no generalized fatigue, change in appetite Eyes: No visual changes, double vision, eye pain Ear, nose and throat: No hearing loss, ear pain, nasal congestion, sore throat Cardiovascular: No chest pain, palpitations Respiratory:  No shortness of breath at rest or with exertion, wheezes GastrointestinaI: No nausea, vomiting, diarrhea, abdominal pain, fecal incontinence Genitourinary:  No dysuria, urinary retention or frequency Musculoskeletal:  No neck pain, back pain Integumentary: No rash, pruritus, skin lesions Neurological: as above Psychiatric: No depression, insomnia, anxiety Endocrine: No palpitations, fatigue, diaphoresis, mood swings, change in appetite, change in weight, increased thirst Hematologic/Lymphatic:  No anemia, purpura, petechiae. Allergic/Immunologic: no itchy/runny eyes, nasal congestion, recent allergic reactions, rashes  PHYSICAL EXAM: Vitals:   01/01/17 1127  BP: (!) 142/60  Pulse: 85  SpO2: 93%   General: No acute distress Head:  Normocephalic/atraumatic Neck: supple, no paraspinal tenderness, full range of motion Back: No paraspinal tenderness Heart: regular rate and rhythm Lungs: Clear to auscultation bilaterally. Vascular: No carotid bruits. Skin/Extremities: No rash, no edema Neurological Exam: Mental status: alert and oriented to person, place, month/day of week, no dysarthria or aphasia, Fund of knowledge is appropriate.  Remote memory intact.  Attention and concentration are normal.    Able to name objects and repeat phrases. CDT 4/5  MMSE - Mini Mental State Exam 01/01/2017 07/03/2016 02/19/2016  Orientation to time _0 Orientation to Place _1 Registration _2 Attention/ Calculation _3 Recall 0 0 0  Language- name 2 objects _4 Language- repeat  _0 Language- follow 3 step command _1 Language- read & follow direction _2 Write a sentence _3 Copy design 1 0 1  Total score _4 Neurological exam: Pupils equal , round. No facial asymmetry. Motor: moves all extremities symmetrically. Gait slow and cautious with cane, no ataxia.   IMPRESSION: This is a pleasant 83 yo RH man with a history of  hypertension, hyperlipidemia, CAD, sinus node dysfunction s/p PPM, paroxysmal atrial fibrillation, with mild dementia. MMSE today 23/30 (18/30 in February 2018). His son also feels he is less confused. Continue Donepezil 57m daily. We had an extensive discussion about driving, continue to monitor and recommend restricting driving locally in good driving conditions. If he continues to notice getting lost, would stop driving. Continue home safety, fall safety. He is on anticoagulation with Eliquis, continue to monitor falls, he has not had any bad falls in the past 4 months. We again discussed the importance of physical exercise and brain stimulation exercises for brain health. He will follow-up in 6 months and knows to call for any changes.  Thank you for allowing me to participate in his care.  Please do not hesitate to call for any questions or concerns.  The duration of this appointment visit was 25 minutes of face-to-face time with the patient.  Greater than 50% of this time was spent in counseling, explanation of diagnosis, planning of further management, and coordination of care.   KEllouise Newer M.D.   CC: Dr. SLivia Snellen

## 2017-01-13 ENCOUNTER — Ambulatory Visit (INDEPENDENT_AMBULATORY_CARE_PROVIDER_SITE_OTHER): Payer: Medicare Other | Admitting: Family Medicine

## 2017-01-13 ENCOUNTER — Encounter: Payer: Self-pay | Admitting: Family Medicine

## 2017-01-13 VITALS — BP 125/75 | HR 62 | Temp 97.0°F | Ht 66.0 in | Wt 177.0 lb

## 2017-01-13 DIAGNOSIS — I10 Essential (primary) hypertension: Secondary | ICD-10-CM

## 2017-01-13 DIAGNOSIS — F03A0 Unspecified dementia, mild, without behavioral disturbance, psychotic disturbance, mood disturbance, and anxiety: Secondary | ICD-10-CM

## 2017-01-13 DIAGNOSIS — F0390 Unspecified dementia without behavioral disturbance: Secondary | ICD-10-CM

## 2017-01-13 DIAGNOSIS — J410 Simple chronic bronchitis: Secondary | ICD-10-CM | POA: Diagnosis not present

## 2017-01-13 DIAGNOSIS — R3914 Feeling of incomplete bladder emptying: Secondary | ICD-10-CM

## 2017-01-13 DIAGNOSIS — I480 Paroxysmal atrial fibrillation: Secondary | ICD-10-CM | POA: Diagnosis not present

## 2017-01-13 DIAGNOSIS — I25118 Atherosclerotic heart disease of native coronary artery with other forms of angina pectoris: Secondary | ICD-10-CM

## 2017-01-13 DIAGNOSIS — N401 Enlarged prostate with lower urinary tract symptoms: Secondary | ICD-10-CM

## 2017-01-13 LAB — URINALYSIS
BILIRUBIN UA: NEGATIVE
Glucose, UA: NEGATIVE
Ketones, UA: NEGATIVE
Leukocytes, UA: NEGATIVE
NITRITE UA: NEGATIVE
PH UA: 5 (ref 5.0–7.5)
Protein, UA: NEGATIVE
Specific Gravity, UA: 1.01 (ref 1.005–1.030)
UUROB: 0.2 mg/dL (ref 0.2–1.0)

## 2017-01-13 MED ORDER — ROSUVASTATIN CALCIUM 5 MG PO TABS: 5.0000 mg | ORAL_TABLET | Freq: Every day | ORAL | 1 refills | Status: DC

## 2017-01-13 MED ORDER — FUROSEMIDE 20 MG PO TABS: 40.0000 mg | ORAL_TABLET | Freq: Every day | ORAL | 11 refills | Status: DC

## 2017-01-13 MED ORDER — CELECOXIB 200 MG PO CAPS: ORAL_CAPSULE | ORAL | 3 refills | Status: DC

## 2017-01-13 MED ORDER — POTASSIUM CHLORIDE CRYS ER 20 MEQ PO TBCR: 20.0000 meq | EXTENDED_RELEASE_TABLET | Freq: Every day | ORAL | 1 refills | Status: DC

## 2017-01-13 MED ORDER — METOPROLOL TARTRATE 25 MG PO TABS: 25.0000 mg | ORAL_TABLET | Freq: Two times a day (BID) | ORAL | 1 refills | Status: DC

## 2017-01-13 NOTE — Progress Notes (Signed)
Subjective:  Patient ID: Brad [name], male    DOB: 23-Jul-1927  Age: 83 y.o. MRN: [id number]  CC: Hypertension (pt here today for routine follow up and has f/u with Urologist next week and saw Neuro last week and was told he was doing better.)   HPI Brad [name] presents for Recheck of his high blood pressure. He continues to take his medicine as recommended. He denies any symptoms referable to cardiovascular or cerebrovascular disease recently. Specifically no chest pain shortness of breath focal numbness or weakness. He does have generalized weakness that has developed slowly with time. He has some ongoing dementia that was recently treated by neurology. Testing showed that he had improved somewhat with the use of very set. Additionally he has an indwelling pacemaker as well as atrial fibrillation and the use of Eliquis for his heart. He denies again any chest pain dyspnea palpitations or easy bruising from these conditions/treatments. He continues to have not to area 3-4 times a night. He was noted recently to have blood in his urine and due to each problem he has been set up to see urology in 3 days.  Depression screen Brad [name] 2/9 01/13/2017 11/08/2016 10/07/2016  Decreased Interest 0 0 0  Down, Depressed, Hopeless 0 0 0  PHQ - 2 Score 0 0 0    History Brad [name] has a past medical history of Ascending aortic aneurysm ([place]); CAD (coronary artery disease); Chronic lower back pain; COPD (chronic obstructive pulmonary disease) ([facility]); DJD (degenerative joint disease); Enlarged prostate; Ethanolism ([place]); GERD (gastroesophageal reflux disease); Hernia, abdominal; HLD (hyperlipidemia); HTN (hypertension); Macular degeneration; PAD (peripheral artery disease) ([place]); Paroxysmal atrial fibrillation ([place]); Psoriasis; S/P AAA repair; Sinus node dysfunction ([place]); Tobacco abuse; and Unilateral congenital absence of kidney.   He has a past surgical history that includes Appendectomy; RESECTION AND GRAFTING OF  INFARENAL  ABDOMINAL  AORTIC ANEURYSM (05/03/2002); Carpal tunnel release (Right, 12/26/2014); Insert / replace / remove pacemaker (11/20/2015); Hernia repair; Cataract extraction, bilateral (Bilateral); Coronary angioplasty with stent; and Cardiac catheterization (N/A, 11/20/2015).   His family history includes COPD in his brother and sister; Cancer in his brother, mother, and sister; Early death in his sister; Stroke in his mother.He reports that he quit smoking about 20 years ago. His smoking use included Cigarettes. He has a 130.00 pack-year smoking history. He has never used smokeless tobacco. He reports that he drinks alcohol. He reports that he does not use drugs.    ROS Review of Systems  Constitutional: Negative for chills, diaphoresis, fever and unexpected weight change.  HENT: Negative for congestion, hearing loss, rhinorrhea and sore throat.   Eyes: Negative for visual disturbance.  Respiratory: Negative for cough and shortness of breath.   Cardiovascular: Negative for chest pain.  Gastrointestinal: Negative for abdominal pain, constipation and diarrhea.  Genitourinary: Positive for difficulty urinating, frequency and hematuria. Negative for decreased urine volume, dysuria, flank pain and urgency.  Musculoskeletal: Negative for arthralgias and joint swelling.  Skin: Negative for rash.  Neurological: Negative for dizziness and headaches.  Hematological: Bruises/bleeds easily.  Psychiatric/Behavioral: Negative for dysphoric mood and sleep disturbance.    Objective:  BP 125/75   Pulse 62   Temp (!) 97 F (36.1 C) (Oral)   Ht 5' 6"  (1.676 m)   Wt 177 lb (80.3 kg)   BMI 28.57 kg/m   BP Readings from Last 3 Encounters:  01/13/17 125/75  01/01/17 (!) 142/60  12/17/16 120/74    Wt Readings from Last 3 Encounters:  01/13/17 177 lb (80.3 kg)  01/01/17 179 lb (81.2 kg)  12/17/16 180 lb 6.4 oz (81.8 kg)     Physical Exam  Constitutional: He is oriented to person, place, and  time. He appears well-developed and well-nourished. No distress.  HENT:  Head: Normocephalic and atraumatic.  Right Ear: External ear normal.  Left Ear: External ear normal.  Nose: Nose normal.  Mouth/Throat: Oropharynx is clear and moist.  Eyes: Pupils are equal, round, and reactive to light. Conjunctivae and EOM are normal.  Neck: Normal range of motion. Neck supple. No thyromegaly present.  Cardiovascular: Normal rate, regular rhythm and normal heart sounds.   No murmur heard. Pulmonary/Chest: Effort normal and breath sounds normal. No respiratory distress. He has no wheezes. He has no rales.  Abdominal: Soft. Bowel sounds are normal. He exhibits no distension. There is no tenderness.  Lymphadenopathy:    He has no cervical adenopathy.  Neurological: He is alert and oriented to person, place, and time. He has normal reflexes.  Skin: Skin is warm and dry.  Psychiatric: He has a normal mood and affect. His behavior is normal. Thought content normal.      Assessment & Plan:   Brad [name] was seen today for hypertension.  Diagnoses and all orders for this visit:  Essential hypertension -     CBC with Differential/Platelet -     Urinalysis -     furosemide (LASIX) 20 MG tablet; Take 2 tablets (40 mg total) by mouth daily. -     potassium chloride SA (KLOR-CON M20) 20 MEQ tablet; Take 1 tablet (20 mEq total) by mouth daily.  Simple chronic bronchitis (HCC)  Benign prostatic hyperplasia with incomplete bladder emptying  Paroxysmal atrial fibrillation ([facility]CC) -     CMP14+EGFR  Mild dementia  Other orders -     celecoxib (CELEBREX) 200 MG capsule; TAKE 1 CAPSULE (200 MG TOTAL) BY MOUTH DAILY. WITH FOOD. FOR JOINT PAIN -     metoprolol tartrate (LOPRESSOR) 25 MG tablet; Take 1 tablet (25 mg total) by mouth 2 (two) times daily. -     rosuvastatin (CRESTOR) 5 MG tablet; Take 1 tablet (5 mg total) by mouth daily.       I have changed Brad [name]'s KLOR-CON M20 to potassium chloride  SA. I have also changed his rosuvastatin. I am also having him maintain his fish oil-omega-3 fatty acids, TURMERIC CURCUMIN PO, triamcinolone cream, fluocinolone, albuterol, acetaminophen, clobetasol, bacitracin, ketoconazole, fexofenadine, acetaminophen, tamsulosin, ELIQUIS, isosorbide mononitrate, donepezil, furosemide, celecoxib, and metoprolol tartrate.  Allergies as of 01/13/2017      Reactions   Aspirin Shortness Of Breath, Swelling   Atorvastatin Other (See Comments)   Leg pain, feet pain   Cephalexin Other (See Comments)   unknown      Medication List       Accurate as of 01/13/17  5:47 PM. Always use your most recent med list.          acetaminophen 500 MG chewable tablet Commonly known as:  TYLENOL Chew 500 mg by mouth every 6 (six) hours as needed for pain.   TYLENOL ARTHRITIS PAIN 650 MG CR tablet Generic drug:  acetaminophen Take 650 mg by mouth every 8 (eight) hours as needed for pain.   albuterol 108 (90 Base) MCG/ACT inhaler Commonly known as:  PROVENTIL HFA;VENTOLIN HFA Inhale 2 puffs into the lungs every 6 (six) hours as needed for wheezing or shortness of breath.   bacitracin ointment Apply 1 application topically 2 (two) times  daily.   celecoxib 200 MG capsule Commonly known as:  CELEBREX TAKE 1 CAPSULE (200 MG TOTAL) BY MOUTH DAILY. WITH FOOD. FOR JOINT PAIN   clobetasol 0.05 % external solution Commonly known as:  TEMOVATE APPLY 2-3 DROPS TO AFFECTED AREAS OF SCALP TWICE A DAY   donepezil 10 MG tablet Commonly known as:  ARICEPT Take 1 tablet daily   ELIQUIS 5 MG Tabs tablet Generic drug:  apixaban TAKE 1 TABLET TWICE A DAY   fexofenadine 180 MG tablet Commonly known as:  ALLEGRA Take 1 tablet (180 mg total) by mouth daily. For allergy symptoms   fish oil-omega-3 fatty acids 1000 MG capsule Take 1 g by mouth daily.   fluocinolone 0.01 % cream Commonly known as:  VANOS Apply 1 application topically daily as needed (for skin).     furosemide 20 MG tablet Commonly known as:  LASIX Take 2 tablets (40 mg total) by mouth daily.   isosorbide mononitrate 30 MG 24 hr tablet Commonly known as:  IMDUR TAKE 1 TABLET BY MOUTH ONCE A DAY   ketoconazole 2 % shampoo Commonly known as:  NIZORAL USE 2-3 TIMES WEEKLY. LEAVE ON FOR 5 MIN. RINSE.   metoprolol tartrate 25 MG tablet Commonly known as:  LOPRESSOR Take 1 tablet (25 mg total) by mouth 2 (two) times daily.   potassium chloride SA 20 MEQ tablet Commonly known as:  KLOR-CON M20 Take 1 tablet (20 mEq total) by mouth daily.   rosuvastatin 5 MG tablet Commonly known as:  CRESTOR Take 1 tablet (5 mg total) by mouth daily.   tamsulosin 0.4 MG Caps capsule Commonly known as:  FLOMAX Take 2 capsules (0.8 mg total) by mouth at bedtime.   triamcinolone cream 0.1 % Commonly known as:  KENALOG Apply 1 application topically 2 (two) times daily. Apply to AA of trunk, arms, legs. Avoid underarms, genitals, face, groin.   TURMERIC CURCUMIN PO Take 1 tablet by mouth daily.            Discharge Care Instructions        Start     Ordered   01/13/17 0000  CBC with Differential/Platelet     01/13/17 0950   01/13/17 0000  CMP14+EGFR     01/13/17 0950   01/13/17 0000  Urinalysis     01/13/17 0950   01/13/17 0000  furosemide (LASIX) 20 MG tablet  Daily     01/13/17 0950   01/13/17 0000  potassium chloride SA (KLOR-CON M20) 20 MEQ tablet  Daily     01/13/17 0950   01/13/17 0000  celecoxib (CELEBREX) 200 MG capsule     01/13/17 0950   01/13/17 0000  metoprolol tartrate (LOPRESSOR) 25 MG tablet  2 times daily     01/13/17 0950   01/13/17 0000  rosuvastatin (CRESTOR) 5 MG tablet  Daily     01/13/17 0950       Follow-up: Return in about 3 months (around 04/15/2017).  [name], M.D.

## 2017-01-16 DIAGNOSIS — N401 Enlarged prostate with lower urinary tract symptoms: Secondary | ICD-10-CM | POA: Diagnosis not present

## 2017-01-16 DIAGNOSIS — R3912 Poor urinary stream: Secondary | ICD-10-CM | POA: Diagnosis not present

## 2017-01-16 DIAGNOSIS — R351 Nocturia: Secondary | ICD-10-CM | POA: Diagnosis not present

## 2017-02-14 DIAGNOSIS — H353132 Nonexudative age-related macular degeneration, bilateral, intermediate dry stage: Secondary | ICD-10-CM | POA: Diagnosis not present

## 2017-02-24 DIAGNOSIS — Z23 Encounter for immunization: Secondary | ICD-10-CM | POA: Diagnosis not present

## 2017-03-13 ENCOUNTER — Other Ambulatory Visit: Payer: Self-pay

## 2017-03-13 DIAGNOSIS — F03A0 Unspecified dementia, mild, without behavioral disturbance, psychotic disturbance, mood disturbance, and anxiety: Secondary | ICD-10-CM

## 2017-03-13 DIAGNOSIS — F0390 Unspecified dementia without behavioral disturbance: Secondary | ICD-10-CM

## 2017-03-13 MED ORDER — DONEPEZIL HCL 10 MG PO TABS: ORAL_TABLET | ORAL | 3 refills | Status: DC

## 2017-04-07 DIAGNOSIS — H33303 Unspecified retinal break, bilateral: Secondary | ICD-10-CM | POA: Diagnosis not present

## 2017-04-07 DIAGNOSIS — H353122 Nonexudative age-related macular degeneration, left eye, intermediate dry stage: Secondary | ICD-10-CM | POA: Diagnosis not present

## 2017-04-16 ENCOUNTER — Ambulatory Visit: Payer: Medicare Other | Admitting: Family Medicine

## 2017-04-16 ENCOUNTER — Ambulatory Visit (INDEPENDENT_AMBULATORY_CARE_PROVIDER_SITE_OTHER): Payer: Medicare Other | Admitting: Family Medicine

## 2017-04-16 ENCOUNTER — Encounter: Payer: Self-pay | Admitting: Family Medicine

## 2017-04-16 ENCOUNTER — Telehealth: Payer: Self-pay | Admitting: Family Medicine

## 2017-04-16 VITALS — BP 126/73 | HR 64 | Temp 96.9°F | Ht 66.0 in | Wt 178.0 lb

## 2017-04-16 DIAGNOSIS — I25118 Atherosclerotic heart disease of native coronary artery with other forms of angina pectoris: Secondary | ICD-10-CM | POA: Diagnosis not present

## 2017-04-16 DIAGNOSIS — E782 Mixed hyperlipidemia: Secondary | ICD-10-CM | POA: Diagnosis not present

## 2017-04-16 DIAGNOSIS — E1169 Type 2 diabetes mellitus with other specified complication: Secondary | ICD-10-CM

## 2017-04-16 DIAGNOSIS — I10 Essential (primary) hypertension: Secondary | ICD-10-CM | POA: Diagnosis not present

## 2017-04-16 DIAGNOSIS — J410 Simple chronic bronchitis: Secondary | ICD-10-CM

## 2017-04-16 DIAGNOSIS — E785 Hyperlipidemia, unspecified: Secondary | ICD-10-CM

## 2017-04-16 DIAGNOSIS — I480 Paroxysmal atrial fibrillation: Secondary | ICD-10-CM | POA: Diagnosis not present

## 2017-04-16 LAB — URINALYSIS
Bilirubin, UA: NEGATIVE
GLUCOSE, UA: NEGATIVE
Ketones, UA: NEGATIVE
LEUKOCYTES UA: NEGATIVE
Nitrite, UA: NEGATIVE
PH UA: 5 (ref 5.0–7.5)
PROTEIN UA: NEGATIVE
Specific Gravity, UA: 1.01 (ref 1.005–1.030)
Urobilinogen, Ur: 0.2 mg/dL (ref 0.2–1.0)

## 2017-04-16 MED ORDER — FINASTERIDE 5 MG PO TABS: 5.0000 mg | ORAL_TABLET | Freq: Every day | ORAL | 1 refills | Status: DC

## 2017-04-16 MED ORDER — APIXABAN 5 MG PO TABS: 5.0000 mg | ORAL_TABLET | Freq: Two times a day (BID) | ORAL | 1 refills | Status: DC

## 2017-04-16 MED ORDER — CELECOXIB 200 MG PO CAPS: ORAL_CAPSULE | ORAL | 1 refills | Status: DC

## 2017-04-16 MED ORDER — ISOSORBIDE MONONITRATE ER 30 MG PO TB24: 30.0000 mg | ORAL_TABLET | Freq: Every day | ORAL | 1 refills | Status: DC

## 2017-04-16 MED ORDER — FLUTICASONE PROPIONATE 50 MCG/ACT NA SUSP: 2.0000 | Freq: Every day | NASAL | 6 refills | Status: DC

## 2017-04-16 MED ORDER — POTASSIUM CHLORIDE CRYS ER 20 MEQ PO TBCR: 20.0000 meq | EXTENDED_RELEASE_TABLET | Freq: Every day | ORAL | 1 refills | Status: DC

## 2017-04-16 MED ORDER — METOPROLOL TARTRATE 25 MG PO TABS: 25.0000 mg | ORAL_TABLET | Freq: Two times a day (BID) | ORAL | 1 refills | Status: DC

## 2017-04-16 MED ORDER — FUROSEMIDE 20 MG PO TABS: 20.0000 mg | ORAL_TABLET | ORAL | 1 refills | Status: DC

## 2017-04-16 MED ORDER — ROSUVASTATIN CALCIUM 5 MG PO TABS: 5.0000 mg | ORAL_TABLET | Freq: Every day | ORAL | 1 refills | Status: DC

## 2017-04-16 NOTE — Progress Notes (Signed)
Subjective:  Patient ID: Brad [name],  male    DOB: 1927-06-01  Age: 83 y.o.    CC: Hypertension (pt here today for routine follow up of his chronic medical conditions, no other concerns voiced.)   HPI Brad [name] presents for  follow-up of hypertension. Patient has no history of headache chest pain or shortness of breath or recent cough. Patient also denies symptoms of TIA such as numbness weakness lateralizing. Patient denies side effects from medication. States taking it regularly.  Patient also  in for follow-up of elevated cholesterol. Doing well without complaints on current medication. Denies side effects of statin including myalgia and arthralgia and nausea. Also in today for liver function testing. Currently no chest pain, shortness of breath or other cardiovascular related symptoms noted.   Patient in for follow-up of atrial fibrillation. Patient denies any recent bouts of chest pain or palpitations. Additionally, patient is taking anticoagulants, Eliquis twice daily. Patient denies any recent excessive bleeding episodes including epistaxis, bleeding from the gums, genitalia, rectal bleeding or hematuria. Additionally there has been no excessive bruising.  Patient has indwelling pacemaker as well  COPD under good control.  No shortness of breath recently.  His activities are only mildly exertional.  He does have arthritis that slows him down.  Multiple aches and pains noted.  Celebrex is helpful.  Aricept seems to be doing a good job of limiting his dementia.  His son seems satisfied that he is staying pretty sharp.  Meanwhile he is getting up 2-3 times per night to go to the bathroom to urinate.  History Brad [name] has a past medical history of Ascending aortic aneurysm ([place]), CAD (coronary artery disease), Chronic lower back pain, COPD (chronic obstructive pulmonary disease) ([place]), DJD (degenerative joint disease), Enlarged prostate, Ethanolism ([place]), GERD (gastroesophageal reflux  disease), Hernia, abdominal, HLD (hyperlipidemia), HTN (hypertension), Macular degeneration, PAD (peripheral artery disease) ([place]), Paroxysmal atrial fibrillation ([place]), Psoriasis, S/P AAA repair, Sinus node dysfunction ([place]), Tobacco abuse, and Unilateral congenital absence of kidney.   He has a past surgical history that includes Appendectomy; RESECTION AND GRAFTING OF INFARENAL  ABDOMINAL  AORTIC ANEURYSM (05/03/2002); Carpal tunnel release (Right, 12/26/2014); Insert / replace / remove pacemaker (11/20/2015); Hernia repair; Cataract extraction, bilateral (Bilateral); Coronary angioplasty with stent; and Cardiac catheterization (N/A, 11/20/2015).   His family history includes COPD in his brother and sister; Cancer in his brother, mother, and sister; Early death in his sister; Stroke in his mother.He reports that he quit smoking about 20 years ago. His smoking use included cigarettes. He has a 130.00 pack-year smoking history. he has never used smokeless tobacco. He reports that he drinks alcohol. He reports that he does not use drugs.  Current Outpatient Medications on File Prior to Visit  Medication Sig Dispense Refill  . acetaminophen (TYLENOL ARTHRITIS PAIN) 650 MG CR tablet Take 650 mg by mouth every 8 (eight) hours as needed for pain.    Marland Kitchen acetaminophen (TYLENOL) 500 MG chewable tablet Chew 500 mg by mouth every 6 (six) hours as needed for pain.    Marland Kitchen albuterol (PROVENTIL HFA;VENTOLIN HFA) 108 (90 Base) MCG/ACT inhaler Inhale 2 puffs into the lungs every 6 (six) hours as needed for wheezing or shortness of breath. 1 Inhaler 2  . bacitracin ointment Apply 1 application topically 2 (two) times daily. 120 g 0  . clobetasol (TEMOVATE) 0.05 % external solution APPLY 2-3 DROPS TO AFFECTED AREAS OF SCALP TWICE A DAY 50 mL 2  . donepezil (ARICEPT) 10  MG tablet Take 1 tablet daily 90 tablet 3  . fexofenadine (ALLEGRA) 180 MG tablet Take 1 tablet (180 mg total) by mouth daily. For allergy symptoms 90 tablet 3    . fish oil-omega-3 fatty acids 1000 MG capsule Take 1 g by mouth daily.      . fluocinolone (VANOS) 0.01 % cream Apply 1 application topically daily as needed (for skin).     Marland Kitchen ketoconazole (NIZORAL) 2 % shampoo USE 2-3 TIMES WEEKLY. LEAVE ON FOR 5 MIN. RINSE. 120 mL 0  . tamsulosin (FLOMAX) 0.4 MG CAPS capsule Take 2 capsules (0.8 mg total) by mouth at bedtime. 180 capsule 3  . triamcinolone cream (KENALOG) 0.1 % Apply 1 application topically 2 (two) times daily. Apply to AA of trunk, arms, legs. Avoid underarms, genitals, face, groin. 453.6 g 3  . TURMERIC CURCUMIN PO Take 1 tablet by mouth daily.     No current facility-administered medications on file prior to visit.     ROS Review of Systems  Constitutional: Negative for chills, diaphoresis, fever and unexpected weight change.  HENT: Positive for congestion and rhinorrhea (Fairly constant particularly in the mornings). Negative for hearing loss and sore throat.   Eyes: Negative for visual disturbance.  Respiratory: Negative for cough and shortness of breath.   Cardiovascular: Negative for chest pain.  Gastrointestinal: Negative for abdominal pain, constipation and diarrhea.  Genitourinary: Positive for frequency (With nocturia). Negative for dysuria and flank pain.  Musculoskeletal: Positive for arthralgias (Multiple joints some relief with Celebrex). Negative for joint swelling.  Skin: Negative for rash.  Neurological: Negative for dizziness and headaches.  Hematological: Does not bruise/bleed easily (Although he does take anticoagulant, Eliquis).  Psychiatric/Behavioral: Negative for dysphoric mood and sleep disturbance.    Objective:  BP 126/73   Pulse 64   Temp (!) 96.9 F (36.1 C) (Oral)   Ht '5\' 6"'  (1.676 m)   Wt 178 lb (80.7 kg)   BMI 28.73 kg/m   BP Readings from Last 3 Encounters:  04/16/17 126/73  01/13/17 125/75  01/01/17 (!) 142/60    Wt Readings from Last 3 Encounters:  04/16/17 178 lb (80.7 kg)   01/13/17 177 lb (80.3 kg)  01/01/17 179 lb (81.2 kg)     Physical Exam  Constitutional: He is oriented to person, place, and time. He appears well-developed and well-nourished. No distress.  HENT:  Head: Normocephalic and atraumatic.  Right Ear: External ear normal.  Left Ear: External ear normal.  Nose: Nose normal.  Mouth/Throat: Oropharynx is clear and moist.  Eyes: Conjunctivae and EOM are normal. Pupils are equal, round, and reactive to light.  Neck: Normal range of motion. Neck supple. No thyromegaly present.  Cardiovascular: Normal rate, regular rhythm and normal heart sounds.  No murmur heard. Pulmonary/Chest: Effort normal and breath sounds normal. No respiratory distress. He has no wheezes. He has no rales.  Abdominal: Soft. Bowel sounds are normal. He exhibits no distension. There is no tenderness.  Lymphadenopathy:    He has no cervical adenopathy.  Neurological: He is alert and oriented to person, place, and time. He has normal reflexes.  Skin: Skin is warm and dry.  Psychiatric: He has a normal mood and affect. His behavior is normal. Judgment and thought content normal.    Diabetic Foot Exam - Simple   No data filed        Assessment & Plan:   Izrael was seen today for hypertension.  Diagnoses and all orders for this visit:  Hyperlipidemia associated with type 2 diabetes mellitus (HCC) -     CBC with Differential/Platelet -     CMP14+EGFR -     Lipid panel  Essential hypertension -     potassium chloride SA (KLOR-CON M20) 20 MEQ tablet; Take 1 tablet (20 mEq total) by mouth daily. -     furosemide (LASIX) 20 MG tablet; Take 1 tablet (20 mg total) by mouth every morning. -     CBC with Differential/Platelet -     CMP14+EGFR -     Lipid panel -     Urinalysis  Simple chronic bronchitis (HCC)  Mixed hyperlipidemia  Paroxysmal atrial fibrillation (HCC)  Other orders -     celecoxib (CELEBREX) 200 MG capsule; TAKE 1 CAPSULE (200 MG TOTAL) BY MOUTH  DAILY. WITH FOOD. FOR JOINT PAIN -     apixaban (ELIQUIS) 5 MG TABS tablet; Take 1 tablet (5 mg total) by mouth 2 (two) times daily. -     isosorbide mononitrate (IMDUR) 30 MG 24 hr tablet; Take 1 tablet (30 mg total) by mouth daily. -     metoprolol tartrate (LOPRESSOR) 25 MG tablet; Take 1 tablet (25 mg total) by mouth 2 (two) times daily. -     rosuvastatin (CRESTOR) 5 MG tablet; Take 1 tablet (5 mg total) by mouth daily. -     fluticasone (FLONASE) 50 MCG/ACT nasal spray; Place 2 sprays into both nostrils daily. -     finasteride (PROSCAR) 5 MG tablet; Take 1 tablet (5 mg total) by mouth daily.   I have changed Berel P. Besser's ELIQUIS to apixaban. I have also changed his isosorbide mononitrate and furosemide. I am also having him start on fluticasone and finasteride. Additionally, I am having him maintain his fish oil-omega-3 fatty acids, TURMERIC CURCUMIN PO, triamcinolone cream, fluocinolone, albuterol, acetaminophen, clobetasol, bacitracin, ketoconazole, fexofenadine, acetaminophen, tamsulosin, donepezil, potassium chloride SA, celecoxib, metoprolol tartrate, and rosuvastatin.  Meds ordered this encounter  Medications  . potassium chloride SA (KLOR-CON M20) 20 MEQ tablet    Sig: Take 1 tablet (20 mEq total) by mouth daily.    Dispense:  90 tablet    Refill:  1  . celecoxib (CELEBREX) 200 MG capsule    Sig: TAKE 1 CAPSULE (200 MG TOTAL) BY MOUTH DAILY. WITH FOOD. FOR JOINT PAIN    Dispense:  90 capsule    Refill:  1  . apixaban (ELIQUIS) 5 MG TABS tablet    Sig: Take 1 tablet (5 mg total) by mouth 2 (two) times daily.    Dispense:  180 tablet    Refill:  1  . isosorbide mononitrate (IMDUR) 30 MG 24 hr tablet    Sig: Take 1 tablet (30 mg total) by mouth daily.    Dispense:  90 tablet    Refill:  1  . metoprolol tartrate (LOPRESSOR) 25 MG tablet    Sig: Take 1 tablet (25 mg total) by mouth 2 (two) times daily.    Dispense:  180 tablet    Refill:  1  . rosuvastatin (CRESTOR) 5  MG tablet    Sig: Take 1 tablet (5 mg total) by mouth daily.    Dispense:  90 tablet    Refill:  1  . furosemide (LASIX) 20 MG tablet    Sig: Take 1 tablet (20 mg total) by mouth every morning.    Dispense:  90 tablet    Refill:  1  . fluticasone (FLONASE) 50 MCG/ACT nasal spray  Sig: Place 2 sprays into both nostrils daily.    Dispense:  16 g    Refill:  6  . finasteride (PROSCAR) 5 MG tablet    Sig: Take 1 tablet (5 mg total) by mouth daily.    Dispense:  90 tablet    Refill:  1     Follow-up: Return in about 6 months (around 10/14/2017).  [name], M.D.

## 2017-04-17 LAB — CMP14+EGFR
ALK PHOS: 54 IU/L (ref 39–117)
ALT: 9 IU/L (ref 0–44)
AST: 15 IU/L (ref 0–40)
Albumin/Globulin Ratio: 1.6 (ref 1.2–2.2)
Albumin: 4.2 g/dL (ref 3.5–4.7)
BUN / CREAT RATIO: 25 — AB (ref 10–24)
BUN: 19 mg/dL (ref 8–27)
Bilirubin Total: 0.9 mg/dL (ref 0.0–1.2)
CALCIUM: 9.1 mg/dL (ref 8.6–10.2)
CHLORIDE: 103 mmol/L (ref 96–106)
CO2: 27 mmol/L (ref 20–29)
CREATININE: 0.76 mg/dL (ref 0.76–1.27)
GFR calc non Af Amer: 82 mL/min/{1.73_m2} (ref >59)
GFR, EST AFRICAN AMERICAN: 95 mL/min/{1.73_m2} (ref >59)
GLOBULIN, TOTAL: 2.7 g/dL (ref 1.5–4.5)
GLUCOSE: 104 mg/dL — AB (ref 65–99)
Potassium: 4.5 mmol/L (ref 3.5–5.2)
Sodium: 143 mmol/L (ref 134–144)
Total Protein: 6.9 g/dL (ref 6.0–8.5)

## 2017-04-17 LAB — CBC WITH DIFFERENTIAL/PLATELET
BASOS ABS: 0 10*3/uL (ref 0.0–0.2)
Basos: 0 %
EOS (ABSOLUTE): 0.3 10*3/uL (ref 0.0–0.4)
EOS: 4 %
HEMATOCRIT: 40 % (ref 37.5–51.0)
Hemoglobin: 13 g/dL (ref 13.0–17.7)
IMMATURE GRANS (ABS): 0 10*3/uL (ref 0.0–0.1)
IMMATURE GRANULOCYTES: 0 %
LYMPHS: 26 %
Lymphocytes Absolute: 1.7 10*3/uL (ref 0.7–3.1)
MCH: 33.1 pg — ABNORMAL HIGH (ref 26.6–33.0)
MCHC: 32.5 g/dL (ref 31.5–35.7)
MCV: 102 fL — ABNORMAL HIGH (ref 79–97)
MONOCYTES: 10 %
Monocytes Absolute: 0.6 10*3/uL (ref 0.1–0.9)
Neutrophils Absolute: 4 10*3/uL (ref 1.4–7.0)
Neutrophils: 60 %
PLATELETS: 188 10*3/uL (ref 150–379)
RBC: 3.93 x10E6/uL — AB (ref 4.14–5.80)
RDW: 13.4 % (ref 12.3–15.4)
WBC: 6.6 10*3/uL (ref 3.4–10.8)

## 2017-04-17 LAB — LIPID PANEL
CHOLESTEROL TOTAL: 128 mg/dL (ref 100–199)
Chol/HDL Ratio: 2.4 ratio (ref 0.0–5.0)
HDL: 54 mg/dL (ref >39)
LDL CALC: 66 mg/dL (ref 0–99)
Triglycerides: 41 mg/dL (ref 0–149)
VLDL CHOLESTEROL CAL: 8 mg/dL (ref 5–40)

## 2017-04-21 NOTE — Telephone Encounter (Signed)
Spoke to pt's son who had questions regarding Proscar and what it was for. Advised pt's son it is for Prostate and he voiced understanding.

## 2017-05-07 ENCOUNTER — Ambulatory Visit (INDEPENDENT_AMBULATORY_CARE_PROVIDER_SITE_OTHER): Payer: Medicare Other | Admitting: *Deleted

## 2017-05-07 DIAGNOSIS — I495 Sick sinus syndrome: Secondary | ICD-10-CM

## 2017-05-07 NOTE — Progress Notes (Signed)
Remote pacemaker transmission.   

## 2017-05-08 ENCOUNTER — Encounter: Payer: Self-pay | Admitting: Cardiology

## 2017-05-15 LAB — CUP PACEART REMOTE DEVICE CHECK
Brady Statistic AP VS Percent: 6.43 %
Brady Statistic AS VP Percent: 0.04 %
Brady Statistic AS VS Percent: 3.46 %
Brady Statistic RA Percent Paced: 96.24 %
Implantable Lead Location: 753859
Implantable Lead Model: 5076
Lead Channel Impedance Value: 304 Ohm
Lead Channel Impedance Value: 418 Ohm
Lead Channel Pacing Threshold Amplitude: 1.25 V
Lead Channel Pacing Threshold Pulse Width: 0.4 ms
Lead Channel Pacing Threshold Pulse Width: 0.4 ms
Lead Channel Sensing Intrinsic Amplitude: 2.625 mV
Lead Channel Sensing Intrinsic Amplitude: 8.875 mV
Lead Channel Setting Pacing Pulse Width: 0.4 ms
MDC IDC LEAD IMPLANT DT: 20170703
MDC IDC LEAD IMPLANT DT: 20170703
MDC IDC LEAD LOCATION: 753860
MDC IDC MSMT BATTERY REMAINING LONGEVITY: 84 mo
MDC IDC MSMT BATTERY VOLTAGE: 3.01 V
MDC IDC MSMT LEADCHNL RA PACING THRESHOLD AMPLITUDE: 0.75 V
MDC IDC MSMT LEADCHNL RA SENSING INTR AMPL: 2.625 mV
MDC IDC MSMT LEADCHNL RV IMPEDANCE VALUE: 304 Ohm
MDC IDC MSMT LEADCHNL RV IMPEDANCE VALUE: 399 Ohm
MDC IDC MSMT LEADCHNL RV SENSING INTR AMPL: 8.875 mV
MDC IDC PG IMPLANT DT: 20170703
MDC IDC SESS DTM: 20181219134439
MDC IDC SET LEADCHNL RA PACING AMPLITUDE: 1.5 V
MDC IDC SET LEADCHNL RV PACING AMPLITUDE: 2.5 V
MDC IDC SET LEADCHNL RV SENSING SENSITIVITY: 0.9 mV
MDC IDC STAT BRADY AP VP PERCENT: 90.07 %
MDC IDC STAT BRADY RV PERCENT PACED: 90.09 %

## 2017-05-30 ENCOUNTER — Other Ambulatory Visit: Payer: Self-pay | Admitting: Family Medicine

## 2017-05-30 DIAGNOSIS — L409 Psoriasis, unspecified: Secondary | ICD-10-CM

## 2017-06-11 ENCOUNTER — Other Ambulatory Visit: Payer: Self-pay

## 2017-06-11 ENCOUNTER — Telehealth: Payer: Self-pay | Admitting: Family Medicine

## 2017-06-11 DIAGNOSIS — R300 Dysuria: Secondary | ICD-10-CM

## 2017-06-11 NOTE — Telephone Encounter (Signed)
Ok to bring urine per [employer] nurse. Orders placed in chart and son notified

## 2017-06-12 ENCOUNTER — Other Ambulatory Visit: Payer: Medicare Other

## 2017-06-12 DIAGNOSIS — R300 Dysuria: Secondary | ICD-10-CM | POA: Diagnosis not present

## 2017-06-12 LAB — MICROSCOPIC EXAMINATION
Bacteria, UA: NONE SEEN
EPITHELIAL CELLS (NON RENAL): NONE SEEN /HPF (ref 0–10)
RBC, UA: >30 /hpf — AB (ref 0–?)
RENAL EPITHEL UA: NONE SEEN /HPF
WBC UA: NONE SEEN /HPF (ref 0–?)

## 2017-06-12 LAB — URINALYSIS, COMPLETE
Bilirubin, UA: NEGATIVE
Glucose, UA: NEGATIVE
Ketones, UA: NEGATIVE
Leukocytes, UA: NEGATIVE
Nitrite, UA: NEGATIVE
PH UA: 6 (ref 5.0–7.5)
PROTEIN UA: NEGATIVE
Specific Gravity, UA: 1.015 (ref 1.005–1.030)
UUROB: 0.2 mg/dL (ref 0.2–1.0)

## 2017-06-16 ENCOUNTER — Encounter: Payer: Self-pay | Admitting: Family Medicine

## 2017-06-16 ENCOUNTER — Ambulatory Visit (INDEPENDENT_AMBULATORY_CARE_PROVIDER_SITE_OTHER): Payer: Medicare Other | Admitting: Family Medicine

## 2017-06-16 VITALS — BP 124/73 | HR 76 | Temp 97.2°F | Ht 66.0 in | Wt 180.4 lb

## 2017-06-16 DIAGNOSIS — N401 Enlarged prostate with lower urinary tract symptoms: Secondary | ICD-10-CM

## 2017-06-16 DIAGNOSIS — H6123 Impacted cerumen, bilateral: Secondary | ICD-10-CM | POA: Diagnosis not present

## 2017-06-16 DIAGNOSIS — I480 Paroxysmal atrial fibrillation: Secondary | ICD-10-CM

## 2017-06-16 DIAGNOSIS — R3914 Feeling of incomplete bladder emptying: Secondary | ICD-10-CM | POA: Diagnosis not present

## 2017-06-16 DIAGNOSIS — R3129 Other microscopic hematuria: Secondary | ICD-10-CM

## 2017-06-16 DIAGNOSIS — H9113 Presbycusis, bilateral: Secondary | ICD-10-CM

## 2017-06-16 DIAGNOSIS — I10 Essential (primary) hypertension: Secondary | ICD-10-CM

## 2017-06-16 NOTE — Progress Notes (Signed)
Subjective:  Patient ID: CECILE GUEVARA, male    DOB: 1928-05-03  Age: 84 y.o. MRN: [id number]  CC: Hearing Problem (pt here today saying he is a little hard of hearing of late and has increased confusion)   HPI KEMAL AMORES presents for patient in today for decreased hearing.  He also has had a bit of confusion which has been concerning for perhaps bladder infection and onset of sepsis.  Urine was brought by a couple of days ago and showed 3+ blood.  He does have a tendency to have cerumen impaction and they want to be sure that it is more than just that or clean it out if it is that.  Regarding the 3+ blood.  He is going to the bathroom 5-6 times a night sometimes.  This is in spite of using his tamsulosin twice a day.  He is on blood thinners for atrial fibrillation and some of the hematuria possibly could be coming from his anticoagulant.  He is not having any pain with urination or pelvic pain or flank pain.  No fever chills or sweats.  Depression screen [facility] 2/9 06/16/2017 04/16/2017 01/13/2017  Decreased Interest 0 0 0  Down, Depressed, Hopeless 0 0 0  PHQ - 2 Score 0 0 0    History Shalamar has a past medical history of Ascending aortic aneurysm ([place]), Atrial fibrillation ([place]) (11/21/2015), CAD (coronary artery disease), Chronic lower back pain, COPD (chronic obstructive pulmonary disease) ([place]), DJD (degenerative joint disease), Enlarged prostate, Ethanolism ([place]), GERD (gastroesophageal reflux disease), Hernia, abdominal, HLD (hyperlipidemia), HTN (hypertension), Macular degeneration, PAD (peripheral artery disease) ([place]), Paroxysmal atrial fibrillation ([place]), Psoriasis, S/P AAA repair, Sinus node dysfunction ([place]), Tobacco abuse, and Unilateral congenital absence of kidney.   He has a past surgical history that includes Appendectomy; RESECTION AND GRAFTING OF INFARENAL  ABDOMINAL  AORTIC ANEURYSM (05/03/2002); Carpal tunnel release (Right, 12/26/2014); Insert / replace / remove pacemaker  (11/20/2015); Hernia repair; Cataract extraction, bilateral (Bilateral); Coronary angioplasty with stent; and Cardiac catheterization (N/A, 11/20/2015).   His family history includes COPD in his brother and sister; Cancer in his brother, mother, and sister; Early death in his sister; Stroke in his mother.He reports that he quit smoking about 21 years ago. His smoking use included cigarettes. He has a 130.00 pack-year smoking history. he has never used smokeless tobacco. He reports that he drinks alcohol. He reports that he does not use drugs.    ROS Review of Systems  Constitutional: Negative for chills, diaphoresis and fever.  HENT: Positive for ear pain. Negative for rhinorrhea and sore throat.   Respiratory: Negative for cough and shortness of breath.   Cardiovascular: Negative for chest pain.  Gastrointestinal: Negative for abdominal pain.  Genitourinary: Positive for difficulty urinating, dysuria, frequency, hematuria and urgency. Negative for enuresis and flank pain.  Musculoskeletal: Negative for arthralgias and myalgias.  Skin: Negative for rash.  Neurological: Negative for weakness and headaches.  Psychiatric/Behavioral: Positive for confusion.    Objective:  BP 124/73   Pulse 76   Temp (!) 97.2 F (36.2 C) (Oral)   Ht 5\' 6"  (1.676 m)   Wt 180 lb 6 oz (81.8 kg)   BMI 29.11 kg/m   BP Readings from Last 3 Encounters:  06/16/17 124/73  04/16/17 126/73  01/13/17 125/75    Wt Readings from Last 3 Encounters:  06/16/17 180 lb 6 oz (81.8 kg)  04/16/17 178 lb (80.7 kg)  01/13/17 177 lb (80.3 kg)  Physical Exam  Constitutional: He is oriented to person, place, and time. He appears well-developed and well-nourished. No distress.  HENT:  Head: Normocephalic and atraumatic.  Right Ear: External ear normal.  Left Ear: External ear normal.  Nose: Nose normal.  Mouth/Throat: Oropharynx is clear and moist.  Eyes: Conjunctivae and EOM are normal. Pupils are equal, round, and  reactive to light.  Neck: Normal range of motion. Neck supple. No thyromegaly present.  Cardiovascular: Normal rate, regular rhythm and normal heart sounds.  No murmur heard. Pulmonary/Chest: Effort normal and breath sounds normal. No respiratory distress. He has no wheezes. He has no rales.  Abdominal: Soft. Bowel sounds are normal. He exhibits no distension. There is no tenderness.  Lymphadenopathy:    He has no cervical adenopathy.  Neurological: He is alert and oriented to person, place, and time. He has normal reflexes.  Skin: Skin is warm and dry.  Psychiatric: He has a normal mood and affect. His behavior is normal. Judgment and thought content normal.      Assessment & Plan:   Mike was seen today for hearing problem.  Diagnoses and all orders for this visit:  Other microscopic hematuria -     Ambulatory referral to Urology -     US RENAL; Future -     US PELVIS (TRANSABDOMINAL ONLY); Future  Presbycusis of both ears  Bilateral impacted cerumen  Paroxysmal atrial fibrillation (HCC)  Essential hypertension  Benign prostatic hyperplasia with incomplete bladder emptying -     Ambulatory referral to Urology -     US RENAL; Future -     US PELVIS (TRANSABDOMINAL ONLY); Future       I am having Jac P. Maino maintain his fish oil-omega-3 fatty acids, TURMERIC CURCUMIN PO, triamcinolone cream, fluocinolone, albuterol, acetaminophen, bacitracin, ketoconazole, fexofenadine, acetaminophen, tamsulosin, donepezil, potassium chloride SA, celecoxib, apixaban, isosorbide mononitrate, metoprolol tartrate, rosuvastatin, furosemide, fluticasone, finasteride, and clobetasol.  Allergies as of 06/16/2017      Reactions   Aspirin Shortness Of Breath, Swelling   Atorvastatin Other (See Comments)   Leg pain, feet pain   Cephalexin Other (See Comments)   unknown      Medication List        Accurate as of 06/16/17 10:42 AM. Always use your most recent med list.            acetaminophen 500 MG chewable tablet Commonly known as:  TYLENOL Chew 500 mg by mouth every 6 (six) hours as needed for pain.   TYLENOL ARTHRITIS PAIN 650 MG CR tablet Generic drug:  acetaminophen Take 650 mg by mouth every 8 (eight) hours as needed for pain.   albuterol 108 (90 Base) MCG/ACT inhaler Commonly known as:  PROVENTIL HFA;VENTOLIN HFA Inhale 2 puffs into the lungs every 6 (six) hours as needed for wheezing or shortness of breath.   apixaban 5 MG Tabs tablet Commonly known as:  ELIQUIS Take 1 tablet (5 mg total) by mouth 2 (two) times daily.   bacitracin ointment Apply 1 application topically 2 (two) times daily.   celecoxib 200 MG capsule Commonly known as:  CELEBREX TAKE 1 CAPSULE (200 MG TOTAL) BY MOUTH DAILY. WITH FOOD. FOR JOINT PAIN   clobetasol 0.05 % external solution Commonly known as:  TEMOVATE APPLY 2-3 DROPS TO AFFECTED AREAS OF SCALP TWICE A DAY   donepezil 10 MG tablet Commonly known as:  ARICEPT Take 1 tablet daily   fexofenadine 180 MG tablet Commonly known as:  ALLEGRA Take 1  tablet (180 mg total) by mouth daily. For allergy symptoms   finasteride 5 MG tablet Commonly known as:  PROSCAR Take 1 tablet (5 mg total) by mouth daily.   fish oil-omega-3 fatty acids 1000 MG capsule Take 1 g by mouth daily.   fluocinolone 0.01 % cream Commonly known as:  VANOS Apply 1 application topically daily as needed (for skin).   fluticasone 50 MCG/ACT nasal spray Commonly known as:  FLONASE Place 2 sprays into both nostrils daily.   furosemide 20 MG tablet Commonly known as:  LASIX Take 1 tablet (20 mg total) by mouth every morning.   isosorbide mononitrate 30 MG 24 hr tablet Commonly known as:  IMDUR Take 1 tablet (30 mg total) by mouth daily.   ketoconazole 2 % shampoo Commonly known as:  NIZORAL USE 2-3 TIMES WEEKLY. LEAVE ON FOR 5 MIN. RINSE.   metoprolol tartrate 25 MG tablet Commonly known as:  LOPRESSOR Take 1 tablet (25 mg total) by  mouth 2 (two) times daily.   potassium chloride SA 20 MEQ tablet Commonly known as:  KLOR-CON M20 Take 1 tablet (20 mEq total) by mouth daily.   rosuvastatin 5 MG tablet Commonly known as:  CRESTOR Take 1 tablet (5 mg total) by mouth daily.   tamsulosin 0.4 MG Caps capsule Commonly known as:  FLOMAX Take 2 capsules (0.8 mg total) by mouth at bedtime.   triamcinolone cream 0.1 % Commonly known as:  KENALOG Apply 1 application topically 2 (two) times daily. Apply to AA of trunk, arms, legs. Avoid underarms, genitals, face, groin.   TURMERIC CURCUMIN PO Take 1 tablet by mouth daily.        Follow-up: No Follow-up on file.  [name], M.D.

## 2017-06-19 ENCOUNTER — Other Ambulatory Visit: Payer: Self-pay | Admitting: Family Medicine

## 2017-06-25 ENCOUNTER — Ambulatory Visit (HOSPITAL_COMMUNITY): Admission: RE | Admit: 2017-06-25 | Payer: Medicare Other | Source: Ambulatory Visit

## 2017-07-16 ENCOUNTER — Other Ambulatory Visit: Payer: Self-pay

## 2017-07-16 ENCOUNTER — Encounter: Payer: Self-pay | Admitting: Neurology

## 2017-07-16 ENCOUNTER — Ambulatory Visit (INDEPENDENT_AMBULATORY_CARE_PROVIDER_SITE_OTHER): Payer: Medicare Other | Admitting: Neurology

## 2017-07-16 VITALS — BP 152/70 | HR 78 | Ht 66.0 in | Wt 190.0 lb

## 2017-07-16 DIAGNOSIS — F0390 Unspecified dementia without behavioral disturbance: Secondary | ICD-10-CM

## 2017-07-16 DIAGNOSIS — F03A0 Unspecified dementia, mild, without behavioral disturbance, psychotic disturbance, mood disturbance, and anxiety: Secondary | ICD-10-CM

## 2017-07-16 MED ORDER — DONEPEZIL HCL 10 MG PO TABS: ORAL_TABLET | ORAL | 3 refills | Status: DC

## 2017-07-16 NOTE — Patient Instructions (Signed)
1. Continue Donepezil 10mg  daily 2. Recommend driving only [id number] from home, during daytime only, in good driving conditions. If we keep getting lost, would stop driving 3. Follow-up in 6 months, call for any changes  FALL PRECAUTIONS: Be cautious when walking. Scan the area for obstacles that may increase the risk of trips and falls. When getting up in the mornings, sit up at the edge of the bed for a few minutes before getting out of bed. Consider elevating the bed at the head end to avoid drop of blood pressure when getting up. Walk always in a well-lit room (use night lights in the walls). Avoid area rugs or power cords from appliances in the middle of the walkways. Use a walker or a cane if necessary and consider physical therapy for balance exercise. Get your eyesight checked regularly.  FINANCIAL OVERSIGHT: Supervision, especially oversight when making financial decisions or transactions is also recommended.  HOME SAFETY: Consider the safety of the kitchen when operating appliances like stoves, microwave oven, and blender. Consider having supervision and share cooking responsibilities until no longer able to participate in those. Accidents with firearms and other hazards in the house should be identified and addressed as well.  DRIVING: Regarding driving, in patients with progressive memory problems, driving will be impaired. We advise to have someone else do the driving if trouble finding directions or if minor accidents are reported. Independent driving assessment is available to determine safety of driving.  ABILITY TO BE LEFT ALONE: If patient is unable to contact 911 operator, consider using LifeLine, or when the need is there, arrange for someone to stay with patients. Smoking is a fire hazard, consider supervision or cessation. Risk of wandering should be assessed by caregiver and if detected at any point, supervision and safe proof recommendations should be instituted.  MEDICATION  SUPERVISION: Inability to self-administer medication needs to be constantly addressed. Implement a mechanism to ensure safe administration of the medications.  RECOMMENDATIONS FOR ALL PATIENTS WITH MEMORY PROBLEMS: 1. Continue to exercise (Recommend 30 minutes of walking everyday, or 3 hours every week) 2. Increase social interactions - continue going to [place] and enjoy social gatherings with friends and family 3. Eat healthy, avoid fried foods and eat more fruits and vegetables 4. Maintain adequate blood pressure, blood sugar, and blood cholesterol level. Reducing the risk of stroke and cardiovascular disease also helps promoting better memory. 5. Avoid stressful situations. Live a simple life and avoid aggravations. Organize your time and prepare for the next day in anticipation. 6. Sleep well, avoid any interruptions of sleep and avoid any distractions in the bedroom that may interfere with adequate sleep quality 7. Avoid sugar, avoid sweets as there is a strong link between excessive sugar intake, diabetes, and cognitive impairment The Mediterranean diet has been shown to help patients reduce the risk of progressive memory disorders and reduces cardiovascular risk. This includes eating fish, eat fruits and green leafy vegetables, nuts like almonds and hazelnuts, walnuts, and also use olive oil. Avoid fast foods and fried foods as much as possible. Avoid sweets and sugar as sugar use has been linked to worsening of memory function.  There is always a concern of gradual progression of memory problems. If this is the case, then we may need to adjust level of care according to patient needs. Support, both to the patient and caregiver, should then be put into place.

## 2017-07-16 NOTE — Progress Notes (Signed)
NEUROLOGY FOLLOW UP OFFICE NOTE  Brad [name] [id number] [birth year]  HISTORY OF PRESENT ILLNESS: I had the pleasure of seeing Brad [name] in follow-up in the neurology clinic on 07/16/2017.  The patient was last seen 6 months ago for mild dementia. He is again accompanied by his son who helps supplement the history today.  MMSE in August 2018 was 23/30 (18/30 in February 2018). Head CT without contrast done 07/30/16 did not show any acute changes, there was moderate global atrophy with ventricular prominence, chronic microvascular disease. He is taking Donepezil 72m daily without side effects. His son feels that he is stable. He reports getting [place] He got turned around and had to stop and ask for directions. His son is in charge of finances and medications. Both sons come daily to make sure he has breakfast and dinner. No hygiene concerns, he is able to dress and bathe independently. No paranoia or hallucinations. He denies any headaches, vision changes, focal numbness/tingling/weakness, no recent falls.   HPI 07/03/2016: This is a pleasant 84yo RH man with a history of hypertension, hyperlipidemia, CAD, sinus node dysfunction s/p PPM, paroxysmal atrial fibrillation, with worsening memory. He reports his memory is "terrible." He lives alone and states his sons have been helping him with bill payments and medications. His son has been noticing gradual memory changes over the past couple of years. He was concerned about his ability to keep up with his medications and took over a year ago. He took over [employer] year ago as well. He and his brother noticed that confusion is worse in the evenings, he would get his phone and remote control confused, he would have trouble with his TV, or get confused as to where he is at. [place] His sons see him in the morning and evening  for meals. His son has always worried about his driving, he has not seen him drive recklessly, but states he would miss a turn but be able to find his way home. The patient states he has gotten lost driving. He states he has left the stove on a few times. There have been some hallucinations where he sees and talks to his dead wife, he has both visual and auditory hallucinations of her. No personality changes.   He gets dizzy when standing and has fallen a few times. He has neck and back pain worse in the past 6 months. He had right carpal tunnel surgery 1-2 years ago and is not dropping his fork anymore, but continues to have numbness and tingling in that hand. He denies any headaches, diplopia, dysarthria/dysphagia, bowel/bladder dysfunction, anosmia. He has occasional mild tremors. Sleep is good, but son reports he occasionally gets up at 1-2am and thinks it is time to wake up. He has had a 10lb weight loss even with good appetite. No family history of dementia. He denies any history of significant head injuries. He stopped drinking alcohol 25 years ago (used to be a heavy drinker).   PAST MEDICAL HISTORY: Past Medical History:  Diagnosis Date  . Ascending aortic aneurysm (HCC)    Dr. HRoxan Hockey . Atrial fibrillation ([facility] 11/21/2015  . CAD (coronary artery disease)    s/p stent to OM2 in past;  Last LHC 3/05: EF 60%, left RA occluded, right RA ok, prox to mid LAD 60%, oD1  75-80%, oD2 small 75%, pOM1 50%, OM2 stent ok with 50% before stent, mRCA 30%, dRCA 30%, PDA 70%.  Last dobutamine myoview 5/06: no scar or ischemia, EF 60%.  . Chronic lower back pain   . COPD (chronic obstructive pulmonary disease) ([place])   . DJD (degenerative joint disease)   . Enlarged prostate   . Ethanolism ([place])   . GERD (gastroesophageal reflux disease)   . Hernia, abdominal    "he has ~ 4; wears binder" (11/20/2015)  . HLD (hyperlipidemia)   . HTN (hypertension)    Last echo 1/07:  Normal LVF, LV thickness upper  limits of normal, mild aortic root dilatation, mild to mod MAC, mild LAE, borderline RVH, mild RAE.  . Macular degeneration   . PAD (peripheral artery disease) ([place])   . Paroxysmal atrial fibrillation (HCC)   . Psoriasis   . S/P AAA repair   . Sinus node dysfunction (HCC)    a. s/p MDT dual chamber PPM with His Bundle pacing  . Tobacco abuse   . Unilateral congenital absence of kidney     MEDICATIONS: Current Outpatient Medications on File Prior to Visit  Medication Sig Dispense Refill  . acetaminophen (TYLENOL ARTHRITIS PAIN) 650 MG CR tablet Take 650 mg by mouth every 8 (eight) hours as needed for pain.    Marland Kitchen acetaminophen (TYLENOL) 500 MG chewable tablet Chew 500 mg by mouth every 6 (six) hours as needed for pain.    Marland Kitchen albuterol (PROVENTIL HFA;VENTOLIN HFA) 108 (90 Base) MCG/ACT inhaler Inhale 2 puffs into the lungs every 6 (six) hours as needed for wheezing or shortness of breath. 1 Inhaler 2  . apixaban (ELIQUIS) 5 MG TABS tablet Take 1 tablet (5 mg total) by mouth 2 (two) times daily. 180 tablet 1  . bacitracin ointment Apply 1 application topically 2 (two) times daily. 120 g 0  . celecoxib (CELEBREX) 200 MG capsule TAKE 1 CAPSULE (200 MG TOTAL) BY MOUTH DAILY. WITH FOOD. FOR JOINT PAIN 90 capsule 1  . clobetasol (TEMOVATE) 0.05 % external solution APPLY 2-3 DROPS TO AFFECTED AREAS OF SCALP TWICE A DAY 50 mL 0  . donepezil (ARICEPT) 10 MG tablet Take 1 tablet daily 90 tablet 3  . fexofenadine (ALLEGRA) 180 MG tablet TAKE 1 TABLET (180 MG TOTAL) BY MOUTH DAILY. FOR ALLERGY SYMPTOMS 90 tablet 1  . finasteride (PROSCAR) 5 MG tablet Take 1 tablet (5 mg total) by mouth daily. 90 tablet 1  . fish oil-omega-3 fatty acids 1000 MG capsule Take 1 g by mouth daily.      . fluocinolone (VANOS) 0.01 % cream Apply 1 application topically daily as needed (for skin).     . fluticasone (FLONASE) 50 MCG/ACT nasal spray Place 2 sprays into both nostrils daily. 16 g 6  . furosemide (LASIX) 20 MG tablet  Take 1 tablet (20 mg total) by mouth every morning. 90 tablet 1  . isosorbide mononitrate (IMDUR) 30 MG 24 hr tablet Take 1 tablet (30 mg total) by mouth daily. 90 tablet 1  . ketoconazole (NIZORAL) 2 % shampoo USE 2-3 TIMES WEEKLY. LEAVE ON FOR 5 MIN. RINSE. 120 mL 0  . metoprolol tartrate (LOPRESSOR) 25 MG tablet Take 1 tablet (25 mg total) by mouth 2 (two) times daily. 180 tablet 1  . potassium chloride SA (KLOR-CON M20) 20 MEQ tablet Take 1 tablet (20 mEq total) by mouth daily. 90 tablet 1  . rosuvastatin (CRESTOR) 5 MG tablet Take 1 tablet (5 mg total) by mouth  daily. 90 tablet 1  . tamsulosin (FLOMAX) 0.4 MG CAPS capsule Take 2 capsules (0.8 mg total) by mouth at bedtime. 180 capsule 3  . triamcinolone cream (KENALOG) 0.1 % Apply 1 application topically 2 (two) times daily. Apply to AA of trunk, arms, legs. Avoid underarms, genitals, face, groin. 453.6 g 3  . TURMERIC CURCUMIN PO Take 1 tablet by mouth daily.     No current facility-administered medications on file prior to visit.     ALLERGIES: Allergies  Allergen Reactions  . Aspirin Shortness Of Breath and Swelling  . Atorvastatin Other (See Comments)    Leg pain, feet pain  . Cephalexin Other (See Comments)    unknown    FAMILY HISTORY: Family History  Problem Relation Age of Onset  . Cancer Mother        uterine  . Stroke Mother        hemi plagic  . Cancer Sister   . COPD Sister   . COPD Brother   . Cancer Brother        Lip  . Early death Sister     SOCIAL HISTORY: Social History   Socioeconomic History  . Marital status: Widowed    Spouse name: Not on file  . Number of children: Not on file  . Years of education: Not on file  . Highest education level: Not on file  Social Needs  . Financial resource strain: Not on file  . Food insecurity - worry: Not on file  . Food insecurity - inability: Not on file  . Transportation needs - medical: Not on file  . Transportation needs - non-medical: Not on file    Occupational History  . Not on file  Tobacco Use  . Smoking status: Former Smoker    Packs/day: 2.50    Years: 52.00    Pack years: 130.00    Types: Cigarettes    Last attempt to quit: 05/06/1996    Years since quitting: 21.2  . Smokeless tobacco: Never Used  Substance and Sexual Activity  . Alcohol use: Yes    Comment: 11/20/2015 "used to drink heavily; quit 05/06/1996"  . Drug use: No  . Sexual activity: No  Other Topics Concern  . Not on file  Social History Narrative  . Not on file    REVIEW OF SYSTEMS: Constitutional: No fevers, chills, or sweats, no generalized fatigue, change in appetite Eyes: No visual changes, double vision, eye pain Ear, nose and throat: No hearing loss, ear pain, nasal congestion, sore throat Cardiovascular: No chest pain, palpitations Respiratory:  No shortness of breath at rest or with exertion, wheezes GastrointestinaI: No nausea, vomiting, diarrhea, abdominal pain, fecal incontinence Genitourinary:  No dysuria, urinary retention or frequency Musculoskeletal:  No neck pain, back pain Integumentary: No rash, pruritus, skin lesions Neurological: as above Psychiatric: No depression, insomnia, anxiety Endocrine: No palpitations, fatigue, diaphoresis, mood swings, change in appetite, change in weight, increased thirst Hematologic/Lymphatic:  No anemia, purpura, petechiae. Allergic/Immunologic: no itchy/runny eyes, nasal congestion, recent allergic reactions, rashes  PHYSICAL EXAM: Vitals:   07/16/17 0955  BP: (!) 152/70  Pulse: 78  SpO2: 92%   General: No acute distress Head:  Normocephalic/atraumatic Neck: supple, no paraspinal tenderness, full range of motion Back: No paraspinal tenderness Heart: regular rate and rhythm Lungs: Clear to auscultation bilaterally. Vascular: No carotid bruits. Skin/Extremities: No rash, no edema Neurological Exam: Mental status: alert and oriented to person, place, no dysarthria or aphasia, Fund of  knowledge is appropriate.  Remote memory intact.0/3 delayed recall.  Attention and concentration are normal, 4/5 spelling WORLD backward. Able to name objects and repeat phrases. Neurological exam: Pupils equal , round. No facial asymmetry. Motor: 5/5 throughout with no pronator drift. No incoordination on finger to nose testing. Gait slow and cautious with cane, no ataxia.   IMPRESSION: This is a pleasant 84 yo RH man with a history of  hypertension, hyperlipidemia, CAD, sinus node dysfunction s/p PPM, paroxysmal atrial fibrillation, with mild dementia. MMSE in August 2018 23/30 (18/30 in February 2018). He continues to live alone with sons overseeing him. We again had an extensive discussion about driving, he was advised to only drive 10 miles from home in good driving conditions before sunset, and if he continues to get lost, would stop driving. His son will continue to monitor driving. Continue Aricept 16m daily. We again discussed the importance of physical exercise and brain stimulation exercises for brain health. He will follow-up in 6 months and knows to call for any changes.  Thank you for allowing me to participate in his care.  Please do not hesitate to call for any questions or concerns.  The duration of this appointment visit was 25 minutes of face-to-face time with the patient.  Greater than 50% of this time was spent in counseling, explanation of diagnosis, planning of further management, and coordination of care.   KEllouise Newer M.D.   CC: Dr. SLivia Snellen

## 2017-07-23 ENCOUNTER — Other Ambulatory Visit: Payer: Self-pay | Admitting: Family Medicine

## 2017-07-23 DIAGNOSIS — L409 Psoriasis, unspecified: Secondary | ICD-10-CM

## 2017-07-24 DIAGNOSIS — R351 Nocturia: Secondary | ICD-10-CM | POA: Diagnosis not present

## 2017-07-24 DIAGNOSIS — N401 Enlarged prostate with lower urinary tract symptoms: Secondary | ICD-10-CM | POA: Diagnosis not present

## 2017-07-24 DIAGNOSIS — R3121 Asymptomatic microscopic hematuria: Secondary | ICD-10-CM | POA: Diagnosis not present

## 2017-08-04 ENCOUNTER — Other Ambulatory Visit: Payer: Self-pay | Admitting: *Deleted

## 2017-08-04 ENCOUNTER — Telehealth: Payer: Self-pay | Admitting: Family Medicine

## 2017-08-04 DIAGNOSIS — L298 Other pruritus: Secondary | ICD-10-CM

## 2017-08-04 MED ORDER — TRIAMCINOLONE ACETONIDE 0.1 % EX CREA: 1.0000 "application " | TOPICAL_CREAM | Freq: Two times a day (BID) | CUTANEOUS | 3 refills | Status: DC

## 2017-08-04 NOTE — Telephone Encounter (Signed)
Refill sent to pharmacy.   

## 2017-08-06 ENCOUNTER — Ambulatory Visit (INDEPENDENT_AMBULATORY_CARE_PROVIDER_SITE_OTHER): Payer: Medicare Other | Admitting: *Deleted

## 2017-08-06 DIAGNOSIS — I495 Sick sinus syndrome: Secondary | ICD-10-CM | POA: Diagnosis not present

## 2017-08-06 NOTE — Progress Notes (Signed)
Remote pacemaker transmission.   

## 2017-08-07 ENCOUNTER — Encounter: Payer: Self-pay | Admitting: Cardiology

## 2017-08-09 ENCOUNTER — Other Ambulatory Visit: Payer: Self-pay | Admitting: Family Medicine

## 2017-08-09 DIAGNOSIS — L409 Psoriasis, unspecified: Secondary | ICD-10-CM

## 2017-08-20 DIAGNOSIS — E7800 Pure hypercholesterolemia, unspecified: Secondary | ICD-10-CM | POA: Diagnosis not present

## 2017-08-20 DIAGNOSIS — H353132 Nonexudative age-related macular degeneration, bilateral, intermediate dry stage: Secondary | ICD-10-CM | POA: Diagnosis not present

## 2017-08-20 DIAGNOSIS — H26492 Other secondary cataract, left eye: Secondary | ICD-10-CM | POA: Diagnosis not present

## 2017-08-20 DIAGNOSIS — I10 Essential (primary) hypertension: Secondary | ICD-10-CM | POA: Diagnosis not present

## 2017-08-28 LAB — CUP PACEART REMOTE DEVICE CHECK
Battery Voltage: 3.01 V
Brady Statistic AP VP Percent: 89.69 %
Brady Statistic RA Percent Paced: 95.72 %
Brady Statistic RV Percent Paced: 89.74 %
Date Time Interrogation Session: 20190320143803
Implantable Lead Implant Date: 20170703
Implantable Lead Location: 753859
Implantable Lead Model: 5076
Implantable Pulse Generator Implant Date: 20170703
Lead Channel Impedance Value: 304 Ohm
Lead Channel Impedance Value: 304 Ohm
Lead Channel Pacing Threshold Amplitude: 0.75 V
Lead Channel Pacing Threshold Pulse Width: 0.4 ms
Lead Channel Pacing Threshold Pulse Width: 0.4 ms
Lead Channel Setting Pacing Amplitude: 1.5 V
Lead Channel Setting Sensing Sensitivity: 0.9 mV
MDC IDC LEAD IMPLANT DT: 20170703
MDC IDC LEAD LOCATION: 753860
MDC IDC MSMT BATTERY REMAINING LONGEVITY: 81 mo
MDC IDC MSMT LEADCHNL RA IMPEDANCE VALUE: 437 Ohm
MDC IDC MSMT LEADCHNL RA SENSING INTR AMPL: 2.875 mV
MDC IDC MSMT LEADCHNL RA SENSING INTR AMPL: 2.875 mV
MDC IDC MSMT LEADCHNL RV IMPEDANCE VALUE: 399 Ohm
MDC IDC MSMT LEADCHNL RV PACING THRESHOLD AMPLITUDE: 1.25 V
MDC IDC MSMT LEADCHNL RV SENSING INTR AMPL: 8.125 mV
MDC IDC MSMT LEADCHNL RV SENSING INTR AMPL: 8.125 mV
MDC IDC SET LEADCHNL RV PACING AMPLITUDE: 2.5 V
MDC IDC SET LEADCHNL RV PACING PULSEWIDTH: 0.4 ms
MDC IDC STAT BRADY AP VS PERCENT: 6.06 %
MDC IDC STAT BRADY AS VP PERCENT: 0.01 %
MDC IDC STAT BRADY AS VS PERCENT: 4.24 %

## 2017-09-23 ENCOUNTER — Other Ambulatory Visit: Payer: Self-pay | Admitting: Family Medicine

## 2017-09-23 DIAGNOSIS — L409 Psoriasis, unspecified: Secondary | ICD-10-CM

## 2017-10-05 ENCOUNTER — Other Ambulatory Visit: Payer: Self-pay | Admitting: Family Medicine

## 2017-10-14 ENCOUNTER — Other Ambulatory Visit: Payer: Self-pay | Admitting: Family Medicine

## 2017-10-14 NOTE — Telephone Encounter (Signed)
Last seen 06/16/17

## 2017-10-17 ENCOUNTER — Ambulatory Visit (INDEPENDENT_AMBULATORY_CARE_PROVIDER_SITE_OTHER): Payer: Medicare Other | Admitting: Family Medicine

## 2017-10-17 ENCOUNTER — Encounter: Payer: Self-pay | Admitting: Family Medicine

## 2017-10-17 VITALS — BP 115/67 | HR 62 | Temp 97.2°F | Ht 66.0 in | Wt 176.5 lb

## 2017-10-17 DIAGNOSIS — F0390 Unspecified dementia without behavioral disturbance: Secondary | ICD-10-CM | POA: Diagnosis not present

## 2017-10-17 DIAGNOSIS — R159 Full incontinence of feces: Secondary | ICD-10-CM

## 2017-10-17 DIAGNOSIS — E782 Mixed hyperlipidemia: Secondary | ICD-10-CM

## 2017-10-17 DIAGNOSIS — I480 Paroxysmal atrial fibrillation: Secondary | ICD-10-CM

## 2017-10-17 DIAGNOSIS — I495 Sick sinus syndrome: Secondary | ICD-10-CM

## 2017-10-17 DIAGNOSIS — I10 Essential (primary) hypertension: Secondary | ICD-10-CM

## 2017-10-17 DIAGNOSIS — J410 Simple chronic bronchitis: Secondary | ICD-10-CM

## 2017-10-17 DIAGNOSIS — F03A0 Unspecified dementia, mild, without behavioral disturbance, psychotic disturbance, mood disturbance, and anxiety: Secondary | ICD-10-CM

## 2017-10-17 LAB — URINALYSIS
BILIRUBIN UA: NEGATIVE
Glucose, UA: NEGATIVE
Ketones, UA: NEGATIVE
Leukocytes, UA: NEGATIVE
Nitrite, UA: NEGATIVE
PH UA: 5 (ref 5.0–7.5)
PROTEIN UA: NEGATIVE
RBC, UA: NEGATIVE
Specific Gravity, UA: 1.015 (ref 1.005–1.030)
Urobilinogen, Ur: 0.2 mg/dL (ref 0.2–1.0)

## 2017-10-17 NOTE — Progress Notes (Signed)
Subjective:  Patient ID: Brad [name],  male    DOB: [birth year]  Age: 84 y.o.    CC: Medical Management of Chronic Issues   HPI Brad [name] presents for  follow-up of hypertension. Patient has no history of headache chest pain or shortness of breath or recent cough. Patient also denies symptoms of TIA such as numbness weakness lateralizing. Patient denies side effects from medication. States taking it regularly.  Patient also  in for follow-up of elevated cholesterol. Doing well without complaints on current medication. Denies side effects  including myalgia and arthralgia and nausea. Also in today for liver function testing. Currently no chest pain, shortness of breath or other cardiovascular related symptoms noted.  History Brad [name] has a past medical history of Ascending aortic aneurysm ([place]), Atrial fibrillation ([place]) (11/21/2015), CAD (coronary artery disease), Chronic lower back pain, COPD (chronic obstructive pulmonary disease) ([place]), DJD (degenerative joint disease), Enlarged prostate, Ethanolism ([place]), GERD (gastroesophageal reflux disease), Hernia, abdominal, HLD (hyperlipidemia), HTN (hypertension), Macular degeneration, PAD (peripheral artery disease) ([place]), Paroxysmal atrial fibrillation ([place]), Psoriasis, S/P AAA repair, Sinus node dysfunction ([place]), Tobacco abuse, and Unilateral congenital absence of kidney.   Brad [name] has a past surgical history that includes Appendectomy; RESECTION AND GRAFTING OF INFARENAL  ABDOMINAL  AORTIC ANEURYSM (05/03/2002); Carpal tunnel release (Right, 12/26/2014); Insert / replace / remove pacemaker (11/20/2015); Hernia repair; Cataract extraction, bilateral (Bilateral); Coronary angioplasty with stent; and Cardiac catheterization (N/A, 11/20/2015).   His family history includes COPD in his brother and sister; Cancer in his brother, mother, and sister; Early death in his sister; Stroke in his mother.Brad [name] reports that Brad [name] quit smoking about 21 years ago. His smoking  use included cigarettes. Brad [name] has a 130.00 pack-year smoking history. Brad [name] has never used smokeless tobacco. Brad [name] reports that Brad [name] drinks alcohol. Brad [name] reports that Brad [name] does not use drugs.  Current Outpatient Medications on File Prior to Visit  Medication Sig Dispense Refill  . acetaminophen (TYLENOL ARTHRITIS PAIN) 650 MG CR tablet Take 650 mg by mouth every 8 (eight) hours as needed for pain.    Marland Kitchen acetaminophen (TYLENOL) 500 MG chewable tablet Chew 500 mg by mouth every 6 (six) hours as needed for pain.    Marland Kitchen albuterol (PROVENTIL HFA;VENTOLIN HFA) 108 (90 Base) MCG/ACT inhaler Inhale 2 puffs into the lungs every 6 (six) hours as needed for wheezing or shortness of breath. 1 Inhaler 2  . apixaban (ELIQUIS) 5 MG TABS tablet Take 1 tablet (5 mg total) by mouth 2 (two) times daily. 180 tablet 1  . bacitracin ointment Apply 1 application topically 2 (two) times daily. 120 g 0  . celecoxib (CELEBREX) 200 MG capsule TAKE 1 CAPSULE (200 MG TOTAL) BY MOUTH DAILY. WITH FOOD. FOR JOINT PAIN 90 capsule 1  . clobetasol (TEMOVATE) 0.05 % external solution APPLY 2-3 DROPS TO AFFECTED AREAS OF SCALP TWICE A DAY 50 mL 0  . donepezil (ARICEPT) 10 MG tablet Take 1 tablet daily 90 tablet 3  . fexofenadine (ALLEGRA) 180 MG tablet TAKE 1 TABLET (180 MG TOTAL) BY MOUTH DAILY. FOR ALLERGY SYMPTOMS 90 tablet 1  . finasteride (PROSCAR) 5 MG tablet TAKE 1 TABLET BY MOUTH EVERY DAY 90 tablet 0  . fish oil-omega-3 fatty acids 1000 MG capsule Take 1 g by mouth daily.      . fluocinolone (VANOS) 0.01 % cream Apply 1 application topically daily as needed (for skin).     . fluticasone (FLONASE) 50 MCG/ACT nasal spray Place 2 sprays into both  nostrils daily. 16 g 6  . furosemide (LASIX) 20 MG tablet Take 1 tablet (20 mg total) by mouth every morning. 90 tablet 1  . isosorbide mononitrate (IMDUR) 30 MG 24 hr tablet Take 1 tablet (30 mg total) by mouth daily. 90 tablet 1  . ketoconazole (NIZORAL) 2 % shampoo USE 2-3 TIMES WEEKLY. LEAVE ON  FOR 5 MIN. RINSE. 120 mL 0  . metoprolol tartrate (LOPRESSOR) 25 MG tablet TAKE 1 TABLET BY MOUTH TWICE A DAY 180 tablet 0  . potassium chloride SA (KLOR-CON M20) 20 MEQ tablet Take 1 tablet (20 mEq total) by mouth daily. 90 tablet 1  . rosuvastatin (CRESTOR) 5 MG tablet Take 1 tablet (5 mg total) by mouth daily. 90 tablet 1  . tamsulosin (FLOMAX) 0.4 MG CAPS capsule Take 2 capsules (0.8 mg total) by mouth at bedtime. 180 capsule 3  . triamcinolone cream (KENALOG) 0.1 % Apply 1 application topically 2 (two) times daily. Apply to AA of trunk, arms, legs. Avoid underarms, genitals, face, groin. 453.6 g 3  . TURMERIC CURCUMIN PO Take 1 tablet by mouth daily.     No current facility-administered medications on file prior to visit.     ROS Review of Systems  Constitutional: Negative.   HENT: Negative.   Eyes: Negative for visual disturbance.  Respiratory: Negative for cough and shortness of breath.   Cardiovascular: Negative for chest pain and leg swelling.  Gastrointestinal: Negative for abdominal pain, diarrhea, nausea and vomiting.  Genitourinary: Negative for difficulty urinating.  Musculoskeletal: Negative for arthralgias and myalgias.  Skin: Negative for rash.  Neurological: Negative for headaches.  Psychiatric/Behavioral: Negative for sleep disturbance.    Objective:  BP 115/67   Pulse 62   Temp (!) 97.2 F (36.2 C) (Oral)   Ht 5' 6"  (1.676 m)   Wt 176 lb 8 oz (80.1 kg)   BMI 28.49 kg/m   BP Readings from Last 3 Encounters:  10/17/17 115/67  07/16/17 (!) 152/70  06/16/17 124/73    Wt Readings from Last 3 Encounters:  10/17/17 176 lb 8 oz (80.1 kg)  07/16/17 190 lb (86.2 kg)  06/16/17 180 lb 6 oz (81.8 kg)     Physical Exam  Constitutional: Brad [name] appears well-developed and well-nourished.  HENT:  Head: Normocephalic and atraumatic.  Right Ear: Tympanic membrane and external ear normal. No decreased hearing is noted.  Left Ear: Tympanic membrane and external ear  normal. No decreased hearing is noted.  Mouth/Throat: No oropharyngeal exudate or posterior oropharyngeal erythema.  Eyes: Pupils are equal, round, and reactive to light.  Neck: Normal range of motion. Neck supple.  Cardiovascular: Normal rate and regular rhythm.  No murmur heard. Pulmonary/Chest: Breath sounds normal. No respiratory distress.  Abdominal: Soft. Bowel sounds are normal. Brad [name] exhibits no mass. There is no tenderness.  Vitals reviewed.   Diabetic Foot Exam - Simple   No data filed        Assessment & Plan:   Brad [name] was seen today for medical management of chronic issues.  Diagnoses and all orders for this visit:  Mild dementia  Simple chronic bronchitis (HCC)  Essential hypertension -     Urinalysis  Paroxysmal atrial fibrillation (HCC)  Sinus node dysfunction (HCC)  Mixed hyperlipidemia -     CBC with Differential/Platelet -     CMP14+EGFR -     Lipid panel  Encopresis   I am having Brad [name] maintain his fish oil-omega-3 fatty acids, TURMERIC CURCUMIN PO, fluocinolone, albuterol, acetaminophen,  bacitracin, acetaminophen, tamsulosin, potassium chloride SA, celecoxib, apixaban, isosorbide mononitrate, rosuvastatin, furosemide, fluticasone, fexofenadine, donepezil, triamcinolone cream, clobetasol, ketoconazole, finasteride, and metoprolol tartrate.  No orders of the defined types were placed in this encounter.    Follow-up: Return in about 6 months (around 04/18/2018).  [name], M.D.

## 2017-10-18 LAB — CMP14+EGFR
A/G RATIO: 1.7 (ref 1.2–2.2)
ALBUMIN: 4.1 g/dL (ref 3.5–4.7)
ALT: 10 IU/L (ref 0–44)
AST: 13 IU/L (ref 0–40)
Alkaline Phosphatase: 51 IU/L (ref 39–117)
BILIRUBIN TOTAL: 0.8 mg/dL (ref 0.0–1.2)
BUN / CREAT RATIO: 20 (ref 10–24)
BUN: 16 mg/dL (ref 8–27)
CO2: 26 mmol/L (ref 20–29)
CREATININE: 0.79 mg/dL (ref 0.76–1.27)
Calcium: 9 mg/dL (ref 8.6–10.2)
Chloride: 105 mmol/L (ref 96–106)
GFR calc Af Amer: 93 mL/min/{1.73_m2} (ref >59)
GFR calc non Af Amer: 80 mL/min/{1.73_m2} (ref >59)
GLOBULIN, TOTAL: 2.4 g/dL (ref 1.5–4.5)
Glucose: 105 mg/dL — ABNORMAL HIGH (ref 65–99)
POTASSIUM: 4.5 mmol/L (ref 3.5–5.2)
SODIUM: 143 mmol/L (ref 134–144)
Total Protein: 6.5 g/dL (ref 6.0–8.5)

## 2017-10-18 LAB — CBC WITH DIFFERENTIAL/PLATELET
Basophils Absolute: 0 10*3/uL (ref 0.0–0.2)
Basos: 0 %
EOS (ABSOLUTE): 0.3 10*3/uL (ref 0.0–0.4)
EOS: 5 %
HEMATOCRIT: 37 % — AB (ref 37.5–51.0)
HEMOGLOBIN: 12.5 g/dL — AB (ref 13.0–17.7)
IMMATURE GRANULOCYTES: 0 %
Immature Grans (Abs): 0 10*3/uL (ref 0.0–0.1)
LYMPHS ABS: 1.4 10*3/uL (ref 0.7–3.1)
Lymphs: 26 %
MCH: 33.1 pg — AB (ref 26.6–33.0)
MCHC: 33.8 g/dL (ref 31.5–35.7)
MCV: 98 fL — AB (ref 79–97)
MONOS ABS: 0.6 10*3/uL (ref 0.1–0.9)
Monocytes: 10 %
NEUTROS ABS: 3.3 10*3/uL (ref 1.4–7.0)
NEUTROS PCT: 59 %
Platelets: 179 10*3/uL (ref 150–450)
RBC: 3.78 x10E6/uL — ABNORMAL LOW (ref 4.14–5.80)
RDW: 14.1 % (ref 12.3–15.4)
WBC: 5.6 10*3/uL (ref 3.4–10.8)

## 2017-10-18 LAB — LIPID PANEL
Chol/HDL Ratio: 2.1 ratio (ref 0.0–5.0)
Cholesterol, Total: 128 mg/dL (ref 100–199)
HDL: 60 mg/dL (ref >39)
LDL CALC: 58 mg/dL (ref 0–99)
TRIGLYCERIDES: 51 mg/dL (ref 0–149)
VLDL Cholesterol Cal: 10 mg/dL (ref 5–40)

## 2017-10-23 ENCOUNTER — Encounter: Payer: Self-pay | Admitting: Family Medicine

## 2017-10-24 ENCOUNTER — Other Ambulatory Visit: Payer: Self-pay | Admitting: Family Medicine

## 2017-10-27 ENCOUNTER — Ambulatory Visit (INDEPENDENT_AMBULATORY_CARE_PROVIDER_SITE_OTHER): Payer: Medicare Other | Admitting: *Deleted

## 2017-10-27 VITALS — BP 113/62 | HR 69 | Temp 97.0°F | Ht 66.0 in | Wt 177.0 lb

## 2017-10-27 DIAGNOSIS — Z0000 Encounter for general adult medical examination without abnormal findings: Secondary | ICD-10-CM

## 2017-10-27 NOTE — Patient Instructions (Signed)
  [name] , Thank you for taking time to come for your Medicare Wellness Visit. I appreciate your ongoing commitment to your health goals. Please review the following plan we discussed and let me know if I can assist you in the future.   These are the goals we discussed: Goals    . Prevent falls     Stay active, continue walking for exercise       This is a list of the screening recommended for you and due dates:  Health Maintenance  Topic Date Due  . Flu Shot  12/18/2017  . Tetanus Vaccine  04/08/2026  . Pneumonia vaccines  Completed    Keep follow up with Dr [name] and other specialist  Try and prevent falls Exercise when you can  Stay active

## 2017-10-27 NOTE — Progress Notes (Addendum)
Subjective:   Brad [name] is a 84 y.o. male who presents for Medicare Annual/Subsequent preventative care. He is retired from 79 years in [employer] fund[employer]. He also done several other jobs in his working years. He enjoys fishing and watching races on tv. For exercise, he walks to the mailbox several times a week. He states that he eats semi-healthy and gets in 2 meals a day. He is currently not active in the church. He lives at home alone, but has family that lives all around him. He has 3 children and 2 of them live local. He doesn't have any pets, but he feeds and cares for the neighborhood pets. He is aware of fall risk and states that his health is some worse than a year ago.    Cardiac Risk Factors include: male gender;advanced age (>42mn, >>57women);hypertension;sedentary lifestyle     Objective:    Vitals: BP 113/62 (BP Location: Left Arm)   Pulse 69   Temp (!) 97 F (36.1 C) (Oral)   Ht _0  (1.676 m)   Wt 177 lb (80.3 kg)   BMI 28.57 kg/m   Body mass index is 28.57 kg/m.  Advanced Directives 10/27/2017 04/08/2016 03/04/2016 02/19/2016 11/20/2015 02/17/2015 12/26/2014  Does Patient Have a Medical Advance Directive? Yes Yes Yes Yes No Yes Yes  Type of Advance Directive Living will;Healthcare Power of AP[place]Living will HRio RicoLiving will [place]af[place]Living will - [place]Living will [place]Living will  Does patient want to make changes to medical advance directive? - No - Patient declined - No - Patient declined - No - Patient declined No - Patient declined  Copy of HKempin Chart? No - copy requested No - copy requested - No - copy requested - No - copy requested Yes  Would patient like information on creating a medical advance directive? - - - - No - patient declined information - -    Tobacco Social History   Tobacco Use  Smoking Status Former  Smoker  . Packs/day: 2.50  . Years: 52.00  . Pack years: 130.00  . Types: Cigarettes  . Last attempt to quit: 05/06/1996  . Years since quitting: 21.4  Smokeless Tobacco Never Used     Counseling given: Not Answered   Past Medical History:  Diagnosis Date  . Ascending aortic aneurysm (HCC)    Dr. HRoxan Hockey . Atrial fibrillation ([place] 11/21/2015  . CAD (coronary artery disease)    s/p stent to OM2 in past;  Last LHC 3/05: EF 60%, left RA occluded, right RA ok, prox to mid LAD 60%, oD1 75-80%, oD2 small 75%, pOM1 50%, OM2 stent ok with 50% before stent, mRCA 30%, dRCA 30%, PDA 70%.  Last dobutamine myoview 5/06: no scar or ischemia, EF 60%.  . Chronic lower back pain   . COPD (chronic obstructive pulmonary disease) ([facility]   . DJD (degenerative joint disease)   . Enlarged prostate   . Ethanolism ([place]   . GERD (gastroesophageal reflux disease)   . Hernia, abdominal    "he has ~ 4; wears binder" (11/20/2015)  . HLD (hyperlipidemia)   . HTN (hypertension)    Last echo 1/07:  Normal LVF, LV thickness upper limits of normal, mild aortic root dilatation, mild to mod MAC, mild LAE, borderline RVH, mild RAE.  . Macular degeneration   . Pacemaker    DR TRadford Pax . PAD (peripheral artery disease) (  HCC)   . Paroxysmal atrial fibrillation ([place])   . Psoriasis   . S/P AAA repair   . Scoliosis   . Sinus node dysfunction (HCC)    a. s/p MDT dual chamber PPM with His Bundle pacing  . Tobacco abuse   . Unilateral congenital absence of kidney    Past Surgical History:  Procedure Laterality Date  . APPENDECTOMY    . CARPAL TUNNEL RELEASE Right 12/26/2014   Procedure: RIGHT ENDOSCOPIC CARPAL TUNNEL SYNDROME RELEASE;  Surgeon: Milly Jakob, MD;  Location: [facility];  Service: Orthopedics;  Laterality: Right;  . CATARACT EXTRACTION, BILATERAL Bilateral   . CORONARY ANGIOPLASTY WITH STENT PLACEMENT     s/p stent to OM2 in past;   . EP IMPLANTABLE DEVICE N/A 11/20/2015   Procedure:  Pacemaker Implant;  Surgeon: Evans Lance, MD;  Location: [facility];  Service: Cardiovascular;  Laterality: N/A;  . HERNIA REPAIR    . INSERT / REPLACE / REMOVE PACEMAKER  11/20/2015  . RESECTION AND GRAFTING OF INFARENAL  ABDOMINAL  AORTIC ANEURYSM  05/03/2002   Family History  Problem Relation Age of Onset  . Cancer Mother        uterine  . Stroke Mother        hemi plagic  . Cancer Sister   . COPD Sister   . Early death Brother   . Cancer Brother        Lip  . Stroke Father   . AAA (abdominal aortic aneurysm) Son   . Arthritis Son        knee  . COPD Brother   . Cancer Son        testicular   . Hypertension Son   . Early death Sister    Social History   Socioeconomic History  . Marital status: Widowed    Spouse name: Not on file  . Number of children: 3  . Years of education: Not on file  . Highest education level: Not on file  Occupational History  . Occupation: retire    Comment: Event organiser  . Financial resource strain: Not on file  . Food insecurity:    Worry: Not on file    Inability: Not on file  . Transportation needs:    Medical: Not on file    Non-medical: Not on file  Tobacco Use  . Smoking status: Former Smoker    Packs/day: 2.50    Years: 52.00    Pack years: 130.00    Types: Cigarettes    Last attempt to quit: 05/06/1996    Years since quitting: 21.4  . Smokeless tobacco: Never Used  Substance and Sexual Activity  . Alcohol use: Yes    Comment: 11/20/2015 "used to drink heavily; quit 05/06/1996"  . Drug use: No  . Sexual activity: Never  Lifestyle  . Physical activity:    Days per week: Not on file    Minutes per session: Not on file  . Stress: Not on file  Relationships  . Social connections:    Talks on phone: Not on file    Gets together: Not on file    Attends religious service: Not on file    Active member of club or organization: Not on file    Attends meetings of clubs or organizations: Not on file     Relationship status: Not on file  Other Topics Concern  . Not on file  Social History Narrative  .  Not on file    Outpatient Encounter Medications as of 10/27/2017  Medication Sig  . acetaminophen (TYLENOL ARTHRITIS PAIN) 650 MG CR tablet Take 650 mg by mouth every 8 (eight) hours as needed for pain.  Marland Kitchen albuterol (PROVENTIL HFA;VENTOLIN HFA) 108 (90 Base) MCG/ACT inhaler Inhale 2 puffs into the lungs every 6 (six) hours as needed for wheezing or shortness of breath.  Marland Kitchen apixaban (ELIQUIS) 5 MG TABS tablet Take 1 tablet (5 mg total) by mouth 2 (two) times daily.  . celecoxib (CELEBREX) 200 MG capsule TAKE 1 CAPSULE (200 MG TOTAL) BY MOUTH DAILY. WITH FOOD. FOR JOINT PAIN  . donepezil (ARICEPT) 10 MG tablet Take 1 tablet daily  . fexofenadine (ALLEGRA) 180 MG tablet TAKE 1 TABLET (180 MG TOTAL) BY MOUTH DAILY. FOR ALLERGY SYMPTOMS  . finasteride (PROSCAR) 5 MG tablet TAKE 1 TABLET BY MOUTH EVERY DAY  . fish oil-omega-3 fatty acids 1000 MG capsule Take 1 g by mouth daily.    . fluticasone (FLONASE) 50 MCG/ACT nasal spray Place 2 sprays into both nostrils daily.  . furosemide (LASIX) 20 MG tablet Take 1 tablet (20 mg total) by mouth every morning.  . isosorbide mononitrate (IMDUR) 30 MG 24 hr tablet Take 1 tablet (30 mg total) by mouth daily.  . metoprolol tartrate (LOPRESSOR) 25 MG tablet TAKE 1 TABLET BY MOUTH TWICE A DAY  . Multiple Vitamin (MULTIVITAMIN WITH MINERALS) TABS tablet Take 1 tablet by mouth daily. PRESER VISION  . potassium chloride SA (KLOR-CON M20) 20 MEQ tablet Take 1 tablet (20 mEq total) by mouth daily.  . rosuvastatin (CRESTOR) 5 MG tablet Take 1 tablet (5 mg total) by mouth daily.  . tamsulosin (FLOMAX) 0.4 MG CAPS capsule Take 2 capsules (0.8 mg total) by mouth at bedtime.  . TURMERIC CURCUMIN PO Take 1 tablet by mouth daily.  . [DISCONTINUED] acetaminophen (TYLENOL) 500 MG chewable tablet Chew 500 mg by mouth every 6 (six) hours as needed for pain.  . [DISCONTINUED]  bacitracin ointment Apply 1 application topically 2 (two) times daily.  . [DISCONTINUED] clobetasol (TEMOVATE) 0.05 % external solution APPLY 2-3 DROPS TO AFFECTED AREAS OF SCALP TWICE A DAY  . [DISCONTINUED] fluocinolone (VANOS) 0.01 % cream Apply 1 application topically daily as needed (for skin).   . [DISCONTINUED] ketoconazole (NIZORAL) 2 % shampoo USE 2-3 TIMES WEEKLY. LEAVE ON FOR 5 MIN. RINSE.  . [DISCONTINUED] triamcinolone cream (KENALOG) 0.1 % Apply 1 application topically 2 (two) times daily. Apply to AA of trunk, arms, legs. Avoid underarms, genitals, face, groin.   No facility-administered encounter medications on file as of 10/27/2017.     Activities of Daily Living In your present state of health, do you have any difficulty performing the following activities: 10/27/2017  Hearing? Y  Vision? Y  Comment wears rx glasses and mac degeneration   Difficulty concentrating or making decisions? Y  Walking or climbing stairs? Y  Dressing or bathing? N  Doing errands, shopping? Y  Preparing Food and eating ? Y  Using the Toilet? Y  In the past six months, have you accidently leaked urine? Y  Do you have problems with loss of bowel control? Y  Managing your Medications? N  Managing your Finances? N  Housekeeping or managing your Housekeeping? N  Some recent data might be hidden    Patient Care Team: [name], MD as PCP - General (Family Medicine) Fay Records, MD (Cardiology) Jovita Kussmaul, MD as Consulting Physician (General Surgery) Cristopher Peru  Viona Gilmore, MD as Consulting Physician (Cardiology) Cameron Sprang, MD as Consulting Physician (Neurology)   Assessment:   This is a routine wellness examination for Satoru.  Exercise Activities and Dietary recommendations Current Exercise Habits: Home exercise routine, Type of exercise: walking, Time (Minutes): 15, Frequency (Times/Week): 3, Weekly Exercise (Minutes/Week): 45, Intensity: Mild  Goals    . Prevent falls     Stay  active, continue walking for exercise       Fall Risk Fall Risk  10/27/2017 10/17/2017 07/16/2017 07/16/2017 06/16/2017  Falls in the past year? _0   Number falls in past yr: - - - - -  Injury with Fall? - - - - -  Risk Factor Category  - - - - -  Risk for fall due to : - - - - -  Follow up - - - - -   Is the patient's home free of loose throw rugs in walkways, pet beds, electrical cords, etc?  Fall risks and hazards were discussed today.   Depression Screen PHQ 2/9 Scores 10/27/2017 10/17/2017 06/16/2017 04/16/2017  PHQ - 2 Score 0 0 0 0    Cognitive Function MMSE - Mini Mental State Exam 10/27/2017 01/01/2017 07/03/2016 02/19/2016 02/19/2016  Orientation to time _1 Orientation to Place _2 Registration _3 Attention/ Calculation _4 -  Recall 2 0 0 0 0  Language- name 2 objects _5 -  Language- repeat _6 -  Language- follow 3 step command _7 -  Language- read & follow direction _8 -  Write a sentence _9 -  Copy design 1 1 0 1 -  Total score _10 -        Immunization History  Administered Date(s) Administered  . Influenza, High Dose Seasonal PF 01/24/2014, 01/16/2016, 02/24/2017  . Influenza,inj,Quad PF,6+ Mos 03/04/2013, 02/17/2015  . Pneumococcal Conjugate-13 02/17/2015  . Pneumococcal Polysaccharide-23 02/19/2016  . Tdap 04/08/2016    Qualifies for Shingles Vaccine? declined  Screening Tests Health Maintenance  Topic Date Due  . INFLUENZA VACCINE  12/18/2017  . TETANUS/TDAP  04/08/2026  . PNA vac Low Risk Adult  Completed   Cancer Screenings: Lung: Low Dose CT Chest recommended if Age 10-80 years, 30 pack-year currently smoking OR have quit w/in 15years. Patient does qualify. Colorectal: due at next ov  Additional Screenings: declined Hepatitis C Screening:      Plan:   pt is to keep follow up with Dr [name] and other specialist He is to keep trying to walk for exercise and stay as active as  possible He will try and prevent falls.   I have personally reviewed and noted the following in the patient's chart:   . Medical and social history . Use of alcohol, tobacco or illicit drugs  . Current medications and supplements . Functional ability and status . Nutritional status . Physical activity . Advanced directives . List of other physicians . Hospitalizations, surgeries, and ER visits in previous 12 months . Vitals . Screenings to include cognitive, depression, and falls . Referrals and appointments  In addition, I have reviewed and discussed with patient certain preventive protocols, quality metrics, and best practice recommendations. A written personalized care plan for preventive services as well as general preventive health recommendations were provided to patient.     Chaslyn Eisen,  Cameron Proud, LPN  08/26/8117 I have reviewed and agree with the above AWV documentation.  [name], M.D.

## 2017-10-30 ENCOUNTER — Other Ambulatory Visit: Payer: Self-pay | Admitting: Family Medicine

## 2017-11-01 ENCOUNTER — Other Ambulatory Visit: Payer: Self-pay | Admitting: Family Medicine

## 2017-11-05 ENCOUNTER — Ambulatory Visit (INDEPENDENT_AMBULATORY_CARE_PROVIDER_SITE_OTHER): Payer: Medicare Other | Admitting: *Deleted

## 2017-11-05 ENCOUNTER — Telehealth: Payer: Self-pay | Admitting: Cardiology

## 2017-11-05 DIAGNOSIS — I495 Sick sinus syndrome: Secondary | ICD-10-CM

## 2017-11-05 NOTE — Progress Notes (Signed)
Remote pacemaker transmission.   

## 2017-11-05 NOTE — Telephone Encounter (Signed)
Spoke with pt and reminded pt of remote transmission that is due today. Pt verbalized understanding.   

## 2017-11-07 LAB — CUP PACEART REMOTE DEVICE CHECK
Battery Voltage: 3 V
Brady Statistic AS VP Percent: 0.01 %
Brady Statistic AS VS Percent: 3.29 %
Brady Statistic RA Percent Paced: 96.66 %
Date Time Interrogation Session: 20190619182101
Implantable Lead Location: 753859
Implantable Lead Model: 5076
Lead Channel Impedance Value: 323 Ohm
Lead Channel Impedance Value: 399 Ohm
Lead Channel Pacing Threshold Amplitude: 0.5 V
Lead Channel Pacing Threshold Amplitude: 1.25 V
Lead Channel Pacing Threshold Pulse Width: 0.4 ms
Lead Channel Pacing Threshold Pulse Width: 0.4 ms
Lead Channel Sensing Intrinsic Amplitude: 3.375 mV
Lead Channel Sensing Intrinsic Amplitude: 7.375 mV
MDC IDC LEAD IMPLANT DT: 20170703
MDC IDC LEAD IMPLANT DT: 20170703
MDC IDC LEAD LOCATION: 753860
MDC IDC MSMT BATTERY REMAINING LONGEVITY: 79 mo
MDC IDC MSMT LEADCHNL RA SENSING INTR AMPL: 3.375 mV
MDC IDC MSMT LEADCHNL RV IMPEDANCE VALUE: 304 Ohm
MDC IDC MSMT LEADCHNL RV IMPEDANCE VALUE: 380 Ohm
MDC IDC MSMT LEADCHNL RV SENSING INTR AMPL: 7.375 mV
MDC IDC PG IMPLANT DT: 20170703
MDC IDC SET LEADCHNL RA PACING AMPLITUDE: 1.5 V
MDC IDC SET LEADCHNL RV PACING AMPLITUDE: 2.5 V
MDC IDC SET LEADCHNL RV PACING PULSEWIDTH: 0.4 ms
MDC IDC SET LEADCHNL RV SENSING SENSITIVITY: 0.9 mV
MDC IDC STAT BRADY AP VP PERCENT: 90.18 %
MDC IDC STAT BRADY AP VS PERCENT: 6.52 %
MDC IDC STAT BRADY RV PERCENT PACED: 90.25 %

## 2017-11-09 ENCOUNTER — Other Ambulatory Visit: Payer: Self-pay | Admitting: Family Medicine

## 2017-11-19 ENCOUNTER — Encounter: Payer: Self-pay | Admitting: Internal Medicine

## 2017-11-19 ENCOUNTER — Ambulatory Visit (INDEPENDENT_AMBULATORY_CARE_PROVIDER_SITE_OTHER): Payer: Medicare Other | Admitting: Internal Medicine

## 2017-11-19 VITALS — BP 114/68 | HR 64 | Ht 66.0 in | Wt 176.0 lb

## 2017-11-19 DIAGNOSIS — I10 Essential (primary) hypertension: Secondary | ICD-10-CM | POA: Diagnosis not present

## 2017-11-19 DIAGNOSIS — Z950 Presence of cardiac pacemaker: Secondary | ICD-10-CM | POA: Diagnosis not present

## 2017-11-19 DIAGNOSIS — I495 Sick sinus syndrome: Secondary | ICD-10-CM | POA: Diagnosis not present

## 2017-11-19 LAB — CUP PACEART INCLINIC DEVICE CHECK
Battery Remaining Longevity: 78 mo
Brady Statistic AP VS Percent: 6.32 %
Brady Statistic AS VS Percent: 3.62 %
Brady Statistic RV Percent Paced: 90.08 %
Implantable Lead Implant Date: 20170703
Implantable Lead Location: 753860
Implantable Lead Model: 5076
Lead Channel Impedance Value: 304 Ohm
Lead Channel Impedance Value: 380 Ohm
Lead Channel Impedance Value: 418 Ohm
Lead Channel Pacing Threshold Amplitude: 1.25 V
Lead Channel Sensing Intrinsic Amplitude: 10.375 mV
Lead Channel Sensing Intrinsic Amplitude: 2.375 mV
Lead Channel Sensing Intrinsic Amplitude: 2.375 mV
Lead Channel Setting Pacing Amplitude: 2.5 V
Lead Channel Setting Sensing Sensitivity: 0.9 mV
MDC IDC LEAD IMPLANT DT: 20170703
MDC IDC LEAD LOCATION: 753859
MDC IDC MSMT BATTERY VOLTAGE: 3.01 V
MDC IDC MSMT LEADCHNL RA PACING THRESHOLD AMPLITUDE: 0.5 V
MDC IDC MSMT LEADCHNL RA PACING THRESHOLD PULSEWIDTH: 0.4 ms
MDC IDC MSMT LEADCHNL RV IMPEDANCE VALUE: 285 Ohm
MDC IDC MSMT LEADCHNL RV PACING THRESHOLD PULSEWIDTH: 0.4 ms
MDC IDC MSMT LEADCHNL RV SENSING INTR AMPL: 8.5 mV
MDC IDC PG IMPLANT DT: 20170703
MDC IDC SESS DTM: 20190703093822
MDC IDC SET LEADCHNL RA PACING AMPLITUDE: 1.5 V
MDC IDC SET LEADCHNL RV PACING PULSEWIDTH: 0.4 ms
MDC IDC STAT BRADY AP VP PERCENT: 90.03 %
MDC IDC STAT BRADY AS VP PERCENT: 0.02 %
MDC IDC STAT BRADY RA PERCENT PACED: 96.22 %

## 2017-11-19 NOTE — Addendum Note (Signed)
Addended by: Rose Phi on: 11/19/2017 05:11 PM   Modules accepted: Orders

## 2017-11-19 NOTE — Progress Notes (Signed)
HPI [name] returns today for followup. He is a pleasant elderly man with a h/o syncope, s/p PPM insertion, and PAF. He has had a chronic ascending aneurysm. He denies chest pain or more syncope. His gait has become unstable. He is walking with a walker.  Allergies  Allergen Reactions  . Aspirin Shortness Of Breath and Swelling  . Atorvastatin Other (See Comments)    Leg pain, feet pain  . Cephalexin Other (See Comments)    unknown     Current Outpatient Medications  Medication Sig Dispense Refill  . acetaminophen (TYLENOL ARTHRITIS PAIN) 650 MG CR tablet Take 650 mg by mouth every 8 (eight) hours as needed for pain.    Marland Kitchen albuterol (PROVENTIL HFA;VENTOLIN HFA) 108 (90 Base) MCG/ACT inhaler Inhale 2 puffs into the lungs every 6 (six) hours as needed for wheezing or shortness of breath. 1 Inhaler 2  . apixaban (ELIQUIS) 5 MG TABS tablet Take 1 tablet (5 mg total) by mouth 2 (two) times daily. 180 tablet 1  . celecoxib (CELEBREX) 200 MG capsule TAKE 1 CAPSULE (200 MG TOTAL) BY MOUTH DAILY. WITH FOOD. FOR JOINT PAIN 90 capsule 0  . donepezil (ARICEPT) 10 MG tablet Take 1 tablet daily 90 tablet 3  . fexofenadine (ALLEGRA) 180 MG tablet TAKE 1 TABLET (180 MG TOTAL) BY MOUTH DAILY. FOR ALLERGY SYMPTOMS 90 tablet 1  . finasteride (PROSCAR) 5 MG tablet TAKE 1 TABLET BY MOUTH EVERY DAY 90 tablet 0  . fish oil-omega-3 fatty acids 1000 MG capsule Take 1 g by mouth daily.      . fluticasone (FLONASE) 50 MCG/ACT nasal spray SPRAY 2 SPRAYS INTO EACH NOSTRIL EVERY DAY 48 g 2  . furosemide (LASIX) 20 MG tablet Take 1 tablet (20 mg total) by mouth every morning. 90 tablet 1  . isosorbide mononitrate (IMDUR) 30 MG 24 hr tablet TAKE 1 TABLET BY MOUTH EVERY DAY 90 tablet 1  . metoprolol tartrate (LOPRESSOR) 25 MG tablet TAKE 1 TABLET BY MOUTH TWICE A DAY 180 tablet 0  . Multiple Vitamin (MULTIVITAMIN WITH MINERALS) TABS tablet Take 1 tablet by mouth daily. PRESER VISION    . potassium chloride  SA (KLOR-CON M20) 20 MEQ tablet Take 1 tablet (20 mEq total) by mouth daily. 90 tablet 1  . rosuvastatin (CRESTOR) 5 MG tablet Take 1 tablet (5 mg total) by mouth daily. 90 tablet 1  . tamsulosin (FLOMAX) 0.4 MG CAPS capsule Take 2 capsules (0.8 mg total) by mouth at bedtime. 180 capsule 3  . TURMERIC CURCUMIN PO Take 1 tablet by mouth daily.     No current facility-administered medications for this visit.      Past Medical History:  Diagnosis Date  . Ascending aortic aneurysm (HCC)    Dr. Roxan Hockey  . Atrial fibrillation ([place]) 11/21/2015  . CAD (coronary artery disease)    s/p stent to OM2 in past;  Last LHC 3/05: EF 60%, left RA occluded, right RA ok, prox to mid LAD 60%, oD1 75-80%, oD2 small 75%, pOM1 50%, OM2 stent ok with 50% before stent, mRCA 30%, dRCA 30%, PDA 70%.  Last dobutamine myoview 5/06: no scar or ischemia, EF 60%.  . Chronic lower back pain   . COPD (chronic obstructive pulmonary disease) ([place])   . DJD (degenerative joint disease)   . Enlarged prostate   . Ethanolism ([place])   . GERD (gastroesophageal reflux disease)   . Hernia, abdominal    "he has ~ 4; wears  binder" (11/20/2015)  . HLD (hyperlipidemia)   . HTN (hypertension)    Last echo 1/07:  Normal LVF, LV thickness upper limits of normal, mild aortic root dilatation, mild to mod MAC, mild LAE, borderline RVH, mild RAE.  . Macular degeneration   . Pacemaker    DR Radford Pax  . PAD (peripheral artery disease) ([place])   . Paroxysmal atrial fibrillation (HCC)   . Psoriasis   . S/P AAA repair   . Scoliosis   . Sinus node dysfunction (HCC)    a. s/p MDT dual chamber PPM with His Bundle pacing  . Tobacco abuse   . Unilateral congenital absence of kidney     ROS:   All systems reviewed and negative except as noted in the HPI.   Past Surgical History:  Procedure Laterality Date  . APPENDECTOMY    . CARPAL TUNNEL RELEASE Right 12/26/2014   Procedure: RIGHT ENDOSCOPIC CARPAL TUNNEL SYNDROME RELEASE;  Surgeon: Milly Jakob, MD;  Location: [facility];  Service: Orthopedics;  Laterality: Right;  . CATARACT EXTRACTION, BILATERAL Bilateral   . CORONARY ANGIOPLASTY WITH STENT PLACEMENT     s/p stent to OM2 in past;   . EP IMPLANTABLE DEVICE N/A 11/20/2015   Procedure: Pacemaker Implant;  Surgeon: Evans Lance, MD;  Location: [facility];  Service: Cardiovascular;  Laterality: N/A;  . HERNIA REPAIR    . INSERT / REPLACE / REMOVE PACEMAKER  11/20/2015  . RESECTION AND GRAFTING OF INFARENAL  ABDOMINAL  AORTIC ANEURYSM  05/03/2002     Family History  Problem Relation Age of Onset  . Cancer Mother        uterine  . Stroke Mother        hemi plagic  . Cancer Sister   . COPD Sister   . Early death Brother   . Cancer Brother        Lip  . Stroke Father   . AAA (abdominal aortic aneurysm) Son   . Arthritis Son        knee  . COPD Brother   . Cancer Son        testicular   . Hypertension Son   . Early death Sister      Social History   Socioeconomic History  . Marital status: Widowed    Spouse name: Not on file  . Number of children: 3  . Years of education: Not on file  . Highest education level: Not on file  Occupational History  . Occupation: retire    Comment: Event organiser  . Financial resource strain: Not on file  . Food insecurity:    Worry: Not on file    Inability: Not on file  . Transportation needs:    Medical: Not on file    Non-medical: Not on file  Tobacco Use  . Smoking status: Former Smoker    Packs/day: 2.50    Years: 52.00    Pack years: 130.00    Types: Cigarettes    Last attempt to quit: 05/06/1996    Years since quitting: 21.5  . Smokeless tobacco: Never Used  Substance and Sexual Activity  . Alcohol use: Yes    Comment: 11/20/2015 "used to drink heavily; quit 05/06/1996"  . Drug use: No  . Sexual activity: Never  Lifestyle  . Physical activity:    Days per week: Not on file    Minutes per session: Not on file  . Stress:  Not on  file  Relationships  . Social connections:    Talks on phone: Not on file    Gets together: Not on file    Attends religious service: Not on file    Active member of club or organization: Not on file    Attends meetings of clubs or organizations: Not on file    Relationship status: Not on file  . Intimate partner violence:    Fear of current or ex partner: Not on file    Emotionally abused: Not on file    Physically abused: Not on file    Forced sexual activity: Not on file  Other Topics Concern  . Not on file  Social History Narrative  . Not on file     BP 114/68   Pulse 64   Ht _0  (1.676 m)   Wt 176 lb (79.8 kg)   SpO2 94%   BMI 28.41 kg/m   Physical Exam:  Well appearing elderly man, NAD HEENT: Unremarkable Neck:  6 cm JVD, no thyromegally Lymphatics:  No adenopathy Back:  No CVA tenderness Lungs:  Clear HEART:  Regular rate rhythm, no murmurs, no rubs, no clicks Abd:  soft, positive bowel sounds, no organomegally, no rebound, no guarding Ext:  2 plus pulses, no edema, no cyanosis, no clubbing Skin:  No rashes no nodules Neuro:  CN II through XII intact, motor grossly intact  EKG - nsr with AV pacing  DEVICE  Normal device function.  See PaceArt for details.   Assess/Plan: 1. Sinus node dysfunction - he is s/p PPM and is asymptomatic 2. HTN - his blood pressure is well controlled. We will follow 3. CAD - he denies anginal symptoms. We will follow.  Mikle Bosworth.D.

## 2017-11-19 NOTE — Patient Instructions (Signed)
Medication Instructions:  Your physician recommends that you continue on your current medications as directed. Please refer to the Current Medication list given to you today.  Labwork: None ordered.  Testing/Procedures: None ordered.  Follow-Up: Your physician wants you to follow-up in: one year with Dr. Lovena Le.   You will receive a reminder letter in the mail two months in advance. If you don't receive a letter, please call our office to schedule the follow-up appointment.  Remote monitoring is used to monitor your Pacemaker from home. This monitoring reduces the number of office visits required to check your device to one time per year. It allows [place] to keep an eye on the functioning of your device to ensure it is working properly. You are scheduled for a device check from home on 02/04/2018. You may send your transmission at any time that day. If you have a wireless device, the transmission will be sent automatically. After your physician reviews your transmission, you will receive a postcard with your next transmission date.  Any Other Special Instructions Will Be Listed Below (If Applicable).  If you need a refill on your cardiac medications before your next appointment, please call your pharmacy.

## 2017-11-27 ENCOUNTER — Encounter: Payer: Self-pay | Admitting: Family Medicine

## 2017-11-27 ENCOUNTER — Ambulatory Visit (INDEPENDENT_AMBULATORY_CARE_PROVIDER_SITE_OTHER): Payer: Medicare Other | Admitting: Family Medicine

## 2017-11-27 VITALS — BP 139/83 | HR 79 | Temp 97.1°F | Ht 66.0 in | Wt 176.5 lb

## 2017-11-27 DIAGNOSIS — M722 Plantar fascial fibromatosis: Secondary | ICD-10-CM | POA: Diagnosis not present

## 2017-11-27 DIAGNOSIS — R609 Edema, unspecified: Secondary | ICD-10-CM | POA: Diagnosis not present

## 2017-11-27 MED ORDER — CELECOXIB 400 MG PO CAPS: 400.0000 mg | ORAL_CAPSULE | Freq: Every day | ORAL | 2 refills | Status: DC

## 2017-11-27 NOTE — Progress Notes (Signed)
Chief Complaint  Patient presents with  . Foot Pain    pt here today c/o left foot pain for the past 2-3 days and son noticed foot was swollen this morning    HPI  Patient presents today for patient relates that he has had pain in the left foot for about 3 days but he has no known injury.  He did start wearing some new shoes.  They have been broken in over 6 months by his son.  However, they are new to him.  He tells me they have have good arch support.  Today his son noticed that the lower leg with swelling as well.  That part has not been painful.  The patient has been taking Celebrex recently.  PMH: Smoking status noted ROS: Per HPI  Objective: BP 139/83   Pulse 79   Temp (!) 97.1 F (36.2 C) (Oral)   Ht 5\' 6"  (1.676 m)   Wt 176 lb 8 oz (80.1 kg)   BMI 28.49 kg/m  Gen: NAD, alert, cooperative with exam HEENT: NCAT, EOMI, PERRL CV: RRR, good S1/S2, no murmur Resp: CTABL, no wheezes, non-labored Ext: 2+ pitting edema one half with a up the left lower leg.  The right is negative.  There is tenderness at the instep and to the origin of the plantar fascia. Neuro: Alert and oriented, No gross deficits  Assessment and plan:  1. Plantar fasciitis of left foot   2. Dependent edema     Meds ordered this encounter  Medications  . celecoxib (CELEBREX) 400 MG capsule    Sig: Take 1 capsule (400 mg total) by mouth daily after breakfast. For foot, muscle, joint pain and swelling    Dispense:  30 capsule    Refill:  2    Please discontinue previous dose of 200mg     Patient should elevate the left leg to reduce the edema.  Add extra arch support for the plantar fascia on the left.  He can get this at his drugstore.  If this does not help consider physical therapy and bracing.  Follow up as needed.  [name], MD

## 2017-11-27 NOTE — Patient Instructions (Signed)
Elevate left foot above level of heart until swelling goes away.

## 2017-12-19 ENCOUNTER — Other Ambulatory Visit: Payer: Self-pay | Admitting: Family Medicine

## 2017-12-19 DIAGNOSIS — I10 Essential (primary) hypertension: Secondary | ICD-10-CM

## 2017-12-31 ENCOUNTER — Telehealth: Payer: Self-pay | Admitting: Family Medicine

## 2017-12-31 NOTE — Telephone Encounter (Signed)
HE can have 2 a day of the 200 mg pill that was previously prescribed, but one a day only of the new, 400 mg strength capsule. (Total daily dose not to exceed 400 mg) Thanks, WS

## 2017-12-31 NOTE — Telephone Encounter (Signed)
Pt's son aware of MD feedback and voiced understanding. 

## 2017-12-31 NOTE — Telephone Encounter (Signed)
Please review and advise.

## 2018-01-01 ENCOUNTER — Other Ambulatory Visit: Payer: Self-pay | Admitting: Family Medicine

## 2018-01-01 DIAGNOSIS — R351 Nocturia: Secondary | ICD-10-CM | POA: Diagnosis not present

## 2018-01-01 DIAGNOSIS — R3912 Poor urinary stream: Secondary | ICD-10-CM | POA: Diagnosis not present

## 2018-01-08 ENCOUNTER — Ambulatory Visit: Payer: Medicare Other | Admitting: Neurology

## 2018-01-12 ENCOUNTER — Other Ambulatory Visit: Payer: Self-pay | Admitting: Family Medicine

## 2018-01-16 ENCOUNTER — Ambulatory Visit: Payer: Medicare Other | Admitting: Neurology

## 2018-02-04 ENCOUNTER — Ambulatory Visit (INDEPENDENT_AMBULATORY_CARE_PROVIDER_SITE_OTHER): Payer: Medicare Other | Admitting: *Deleted

## 2018-02-04 ENCOUNTER — Telehealth: Payer: Self-pay | Admitting: Cardiology

## 2018-02-04 DIAGNOSIS — I495 Sick sinus syndrome: Secondary | ICD-10-CM | POA: Diagnosis not present

## 2018-02-04 NOTE — Telephone Encounter (Signed)
Confirmed remote transmission w/ pt son.    

## 2018-02-05 NOTE — Progress Notes (Signed)
Remote pacemaker transmission.   

## 2018-02-27 LAB — CUP PACEART REMOTE DEVICE CHECK
Brady Statistic AP VP Percent: 89.74 %
Brady Statistic AP VS Percent: 5.4 %
Brady Statistic AS VP Percent: 0.17 %
Brady Statistic AS VS Percent: 4.68 %
Brady Statistic RA Percent Paced: 94.69 %
Implantable Lead Implant Date: 20170703
Implantable Lead Location: 753859
Implantable Lead Model: 3830
Implantable Lead Model: 5076
Lead Channel Impedance Value: 285 Ohm
Lead Channel Impedance Value: 304 Ohm
Lead Channel Impedance Value: 418 Ohm
Lead Channel Pacing Threshold Amplitude: 1.25 V
Lead Channel Pacing Threshold Pulse Width: 0.4 ms
Lead Channel Sensing Intrinsic Amplitude: 1.25 mV
Lead Channel Sensing Intrinsic Amplitude: 9.375 mV
Lead Channel Sensing Intrinsic Amplitude: 9.375 mV
Lead Channel Setting Pacing Amplitude: 1.5 V
Lead Channel Setting Pacing Amplitude: 2.5 V
Lead Channel Setting Pacing Pulse Width: 0.4 ms
MDC IDC LEAD IMPLANT DT: 20170703
MDC IDC LEAD LOCATION: 753860
MDC IDC MSMT BATTERY REMAINING LONGEVITY: 77 mo
MDC IDC MSMT BATTERY VOLTAGE: 3.01 V
MDC IDC MSMT LEADCHNL RA PACING THRESHOLD AMPLITUDE: 0.5 V
MDC IDC MSMT LEADCHNL RA PACING THRESHOLD PULSEWIDTH: 0.4 ms
MDC IDC MSMT LEADCHNL RA SENSING INTR AMPL: 1.25 mV
MDC IDC MSMT LEADCHNL RV IMPEDANCE VALUE: 380 Ohm
MDC IDC PG IMPLANT DT: 20170703
MDC IDC SESS DTM: 20190918171437
MDC IDC SET LEADCHNL RV SENSING SENSITIVITY: 0.9 mV
MDC IDC STAT BRADY RV PERCENT PACED: 90.08 %

## 2018-03-03 IMAGING — CR DG HAND COMPLETE 3+V*L*
3 series · 3 of 3 positions shown · non-contrast
Comparison: None.

CLINICAL DATA: Abrasion with pain

EXAM:
LEFT HAND - COMPLETE 3+ VIEW

[hand pa]
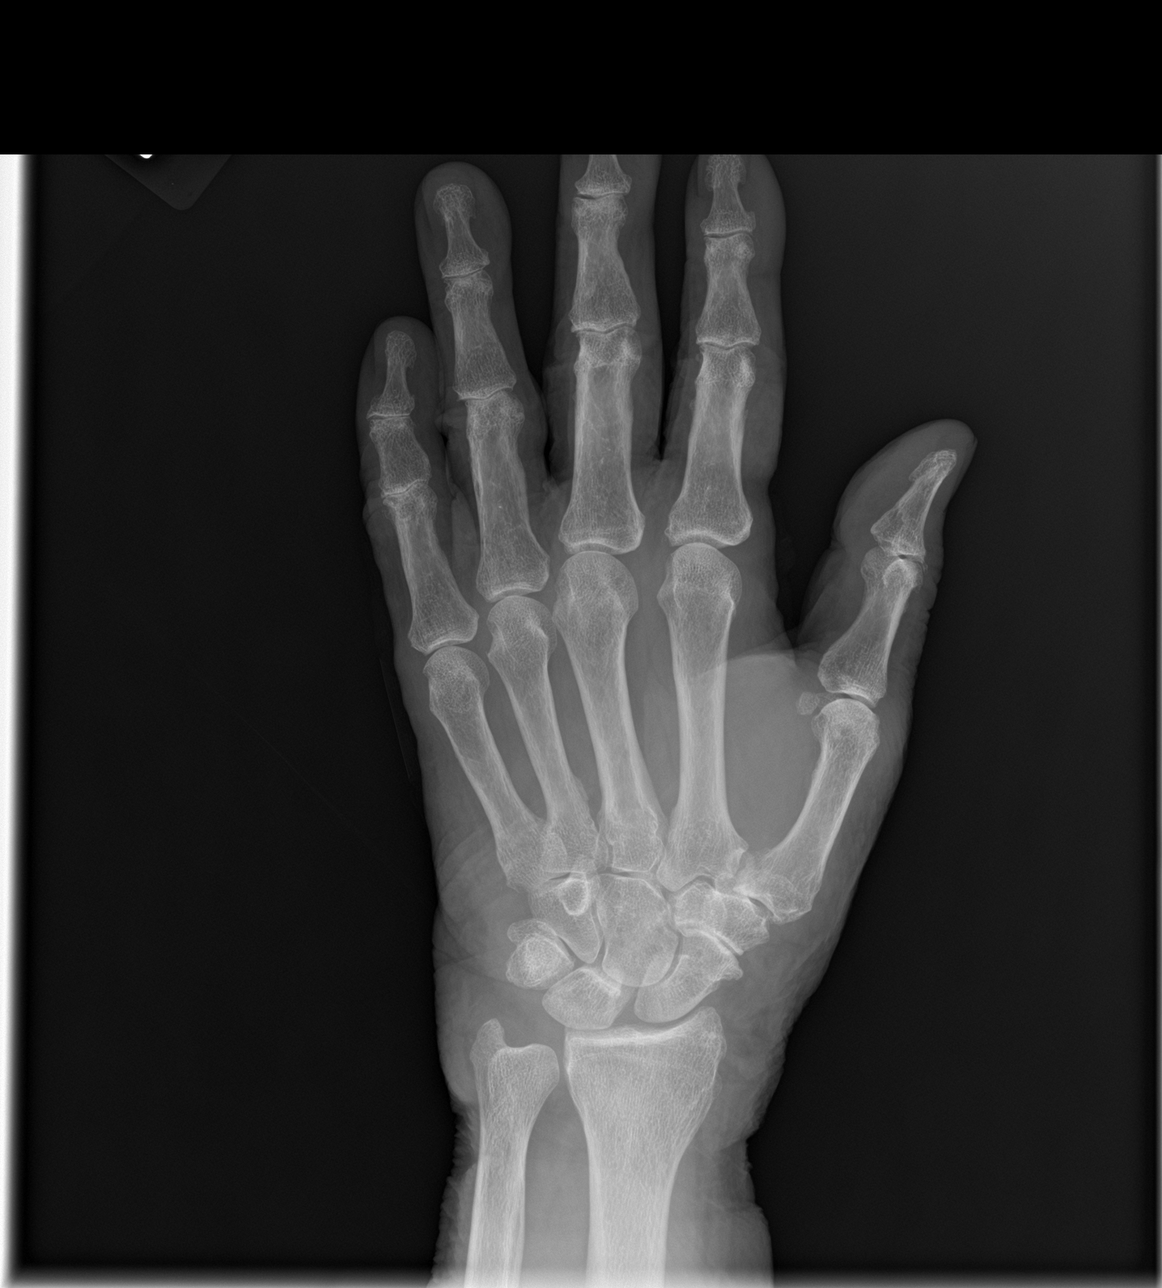

[hand obl]
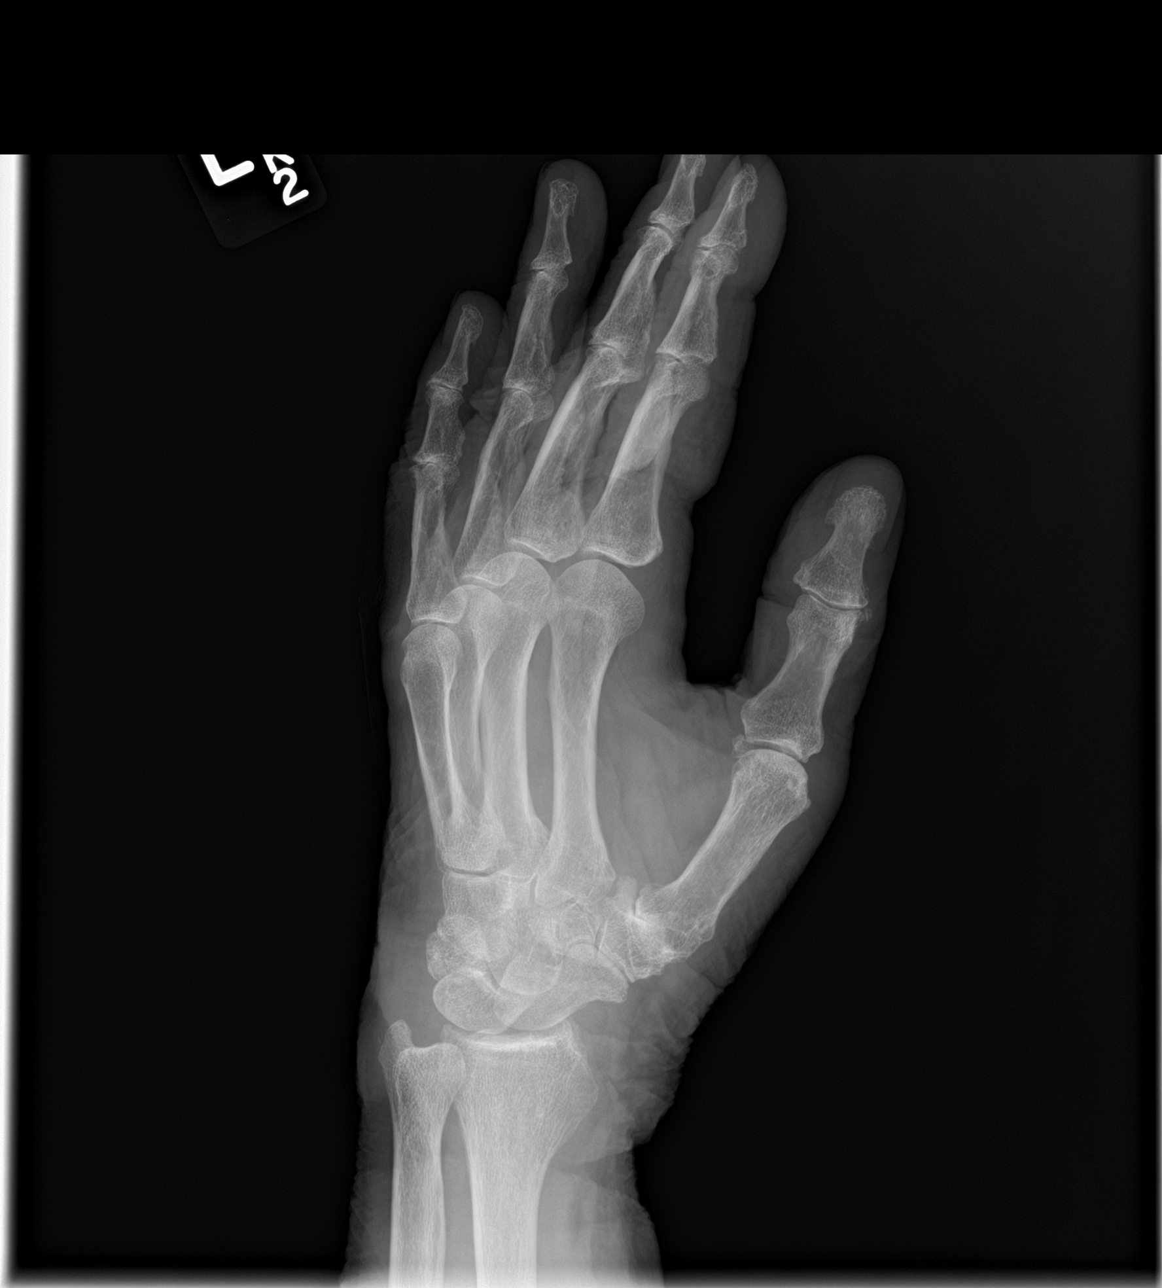

[hand lat]
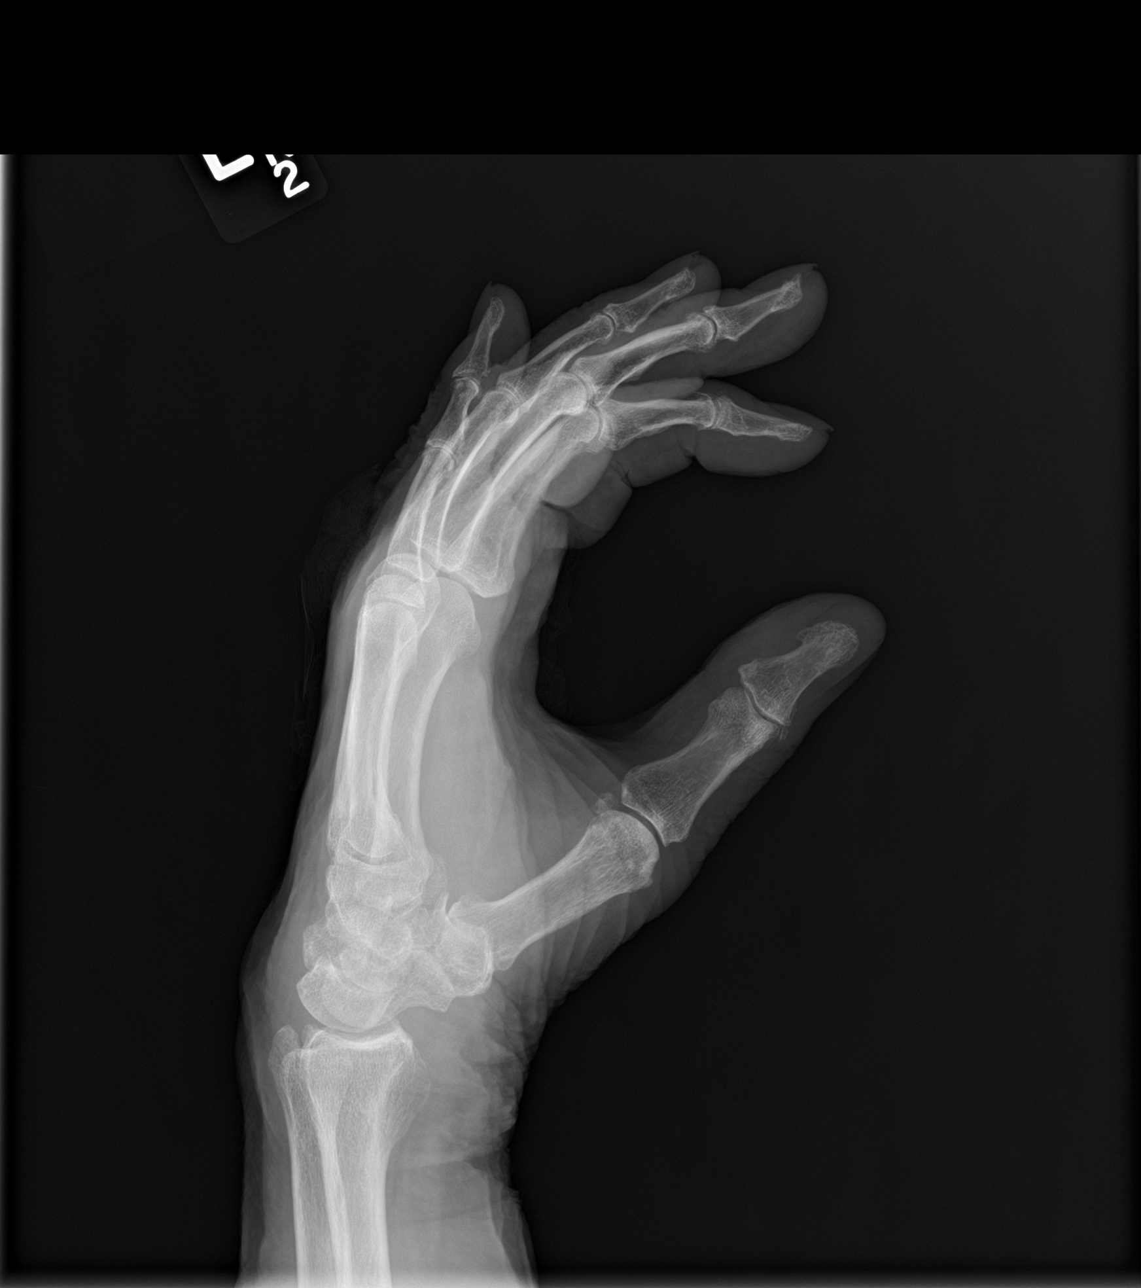

[3 of 3 positions shown; findings below may reference images not displayed]

FINDINGS: No acute displaced fracture or subluxation. No radiopaque foreign
body. Narrowing and osteophyte at all PIP and DIP joints consistent
with arthritis. Moderate degenerative changes at the first CMC
joint. Mild to moderate intercarpal degenerative changes.
IMPRESSION: No acute osseous abnormality

## 2018-03-03 IMAGING — CT CT CERVICAL SPINE W/O CM
4 of 8 series · 14 of 33 positions shown, 15 images · non-contrast
Comparison: CT scan of chest April 11, 2015 and April 12, 2014.

CLINICAL DATA: Fall.

EXAM:
CT CERVICAL SPINE WITHOUT CONTRAST
TECHNIQUE: Multidetector CT imaging of the cervical spine was performed without
intravenous contrast. Multiplanar CT image reconstructions were also
generated.

[Series 202: soft tissue, idose (2) · axial · 0.32mm/px · z∈[+810,+904]mm · 3 of 95 slices shown]
[im 24/95  soft-tissue]
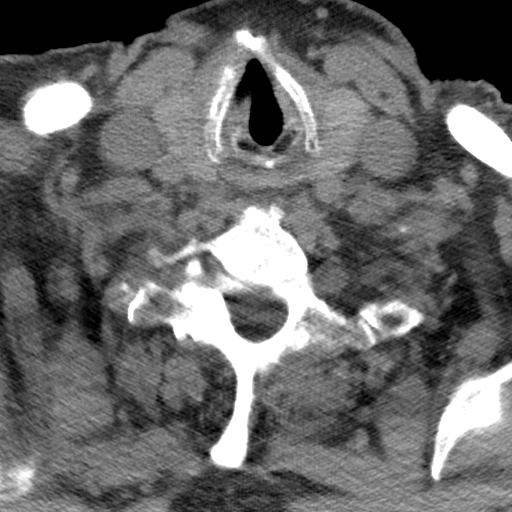
[im 48/95  soft-tissue]
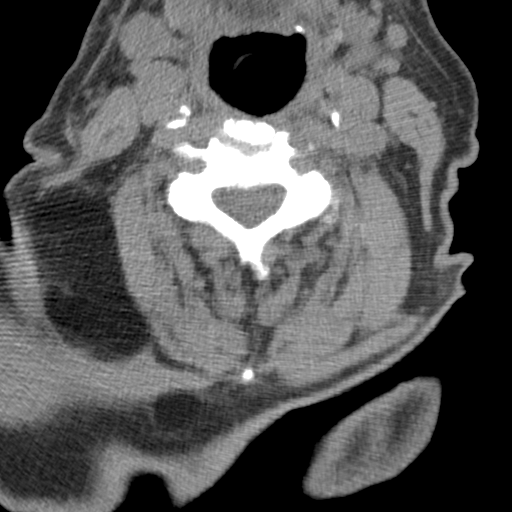
[im 71/95  soft-tissue]
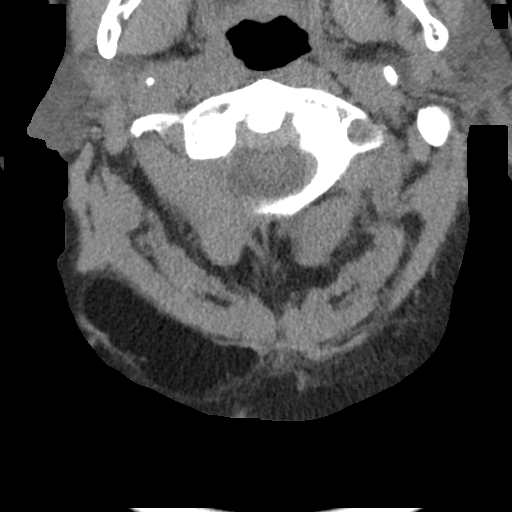

[Series 204: coronal, idose (2) · coronal · 0.36mm/px · 3 of 75 slices shown]
[im 3/75  bone]
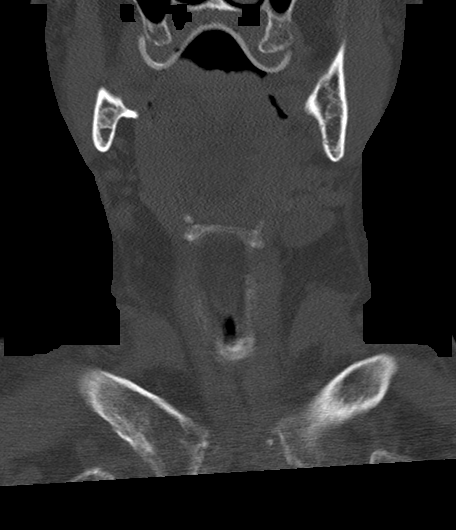
[im 38/75  bone]
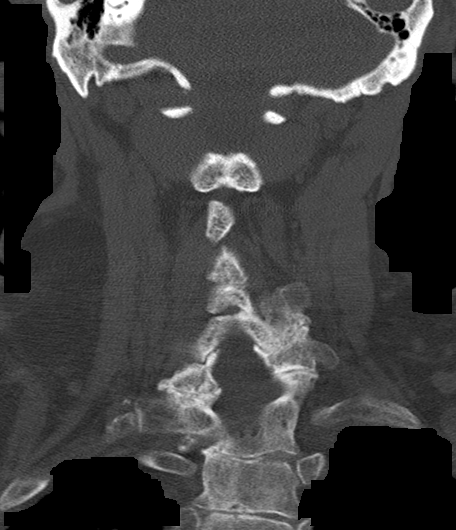
[im 73/75  bone]
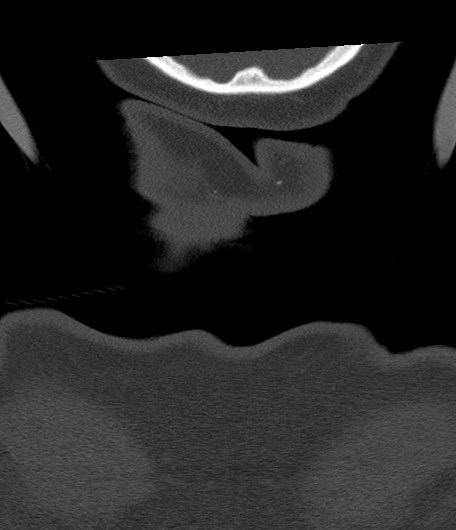

[Series 205: sagittal, idose (2) · sagittal · 0.32mm/px · 5 of 80 slices shown]
[im 14/80  bone]
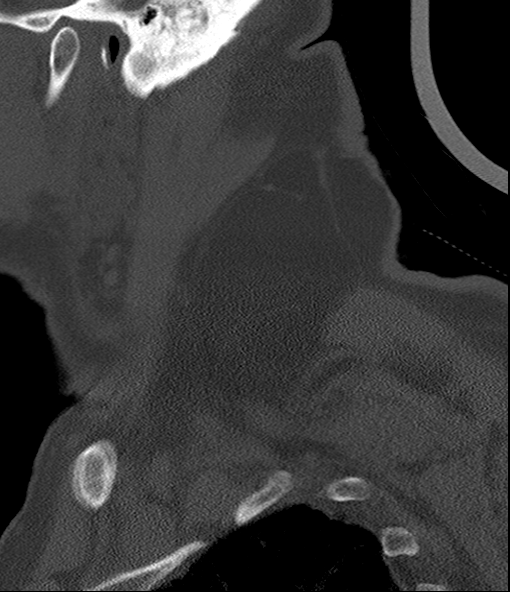
[im 27/80  bone]
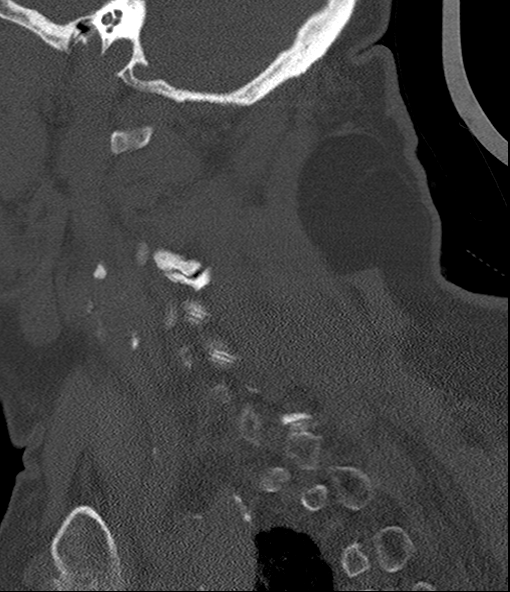
[im 40/80  bone]
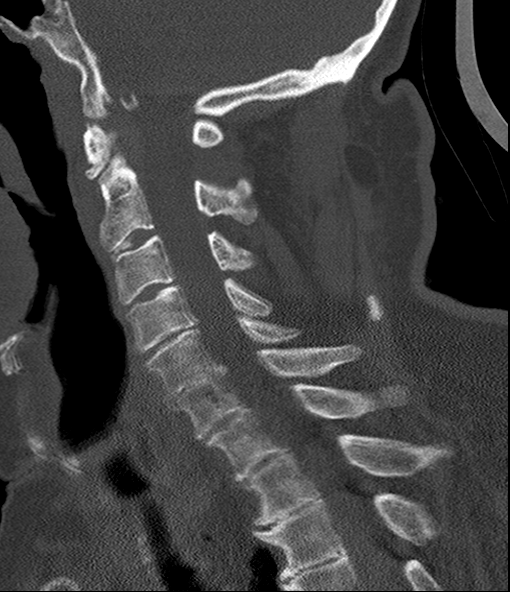
[im 53/80  bone]
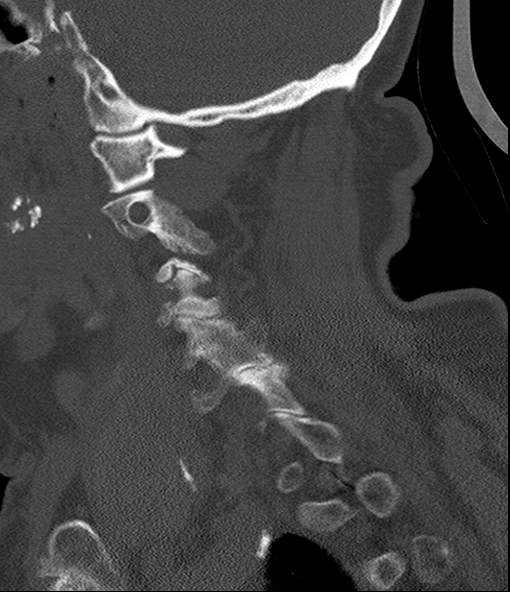
[im 66/80  bone]
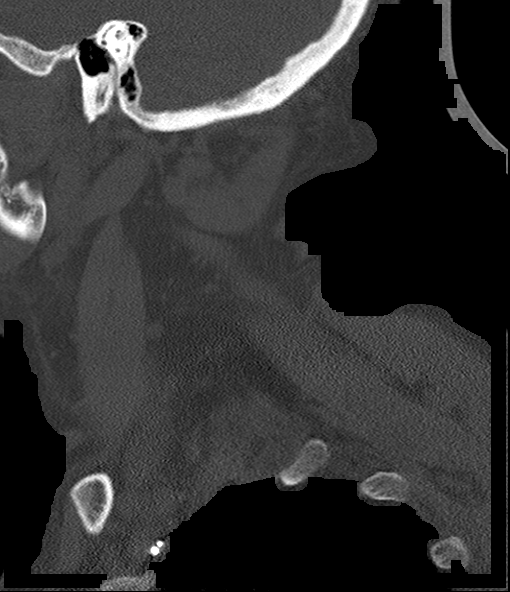

[Series 206: orthogonals, idose (2) · axial · 0.35mm/px · z∈[+814,+906]mm · 3 of 93 slices shown, 4 images]
[im 24/93  soft-tissue]
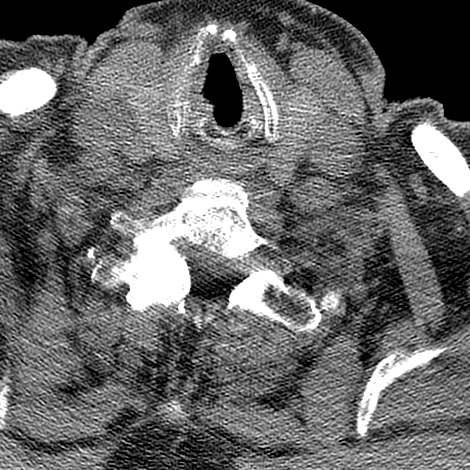
[im 24/93  bone]
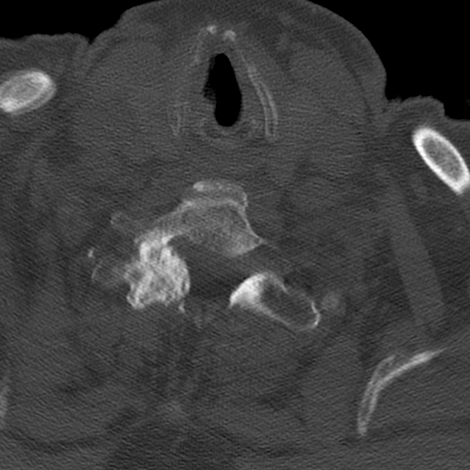
[im 47/93  bone]
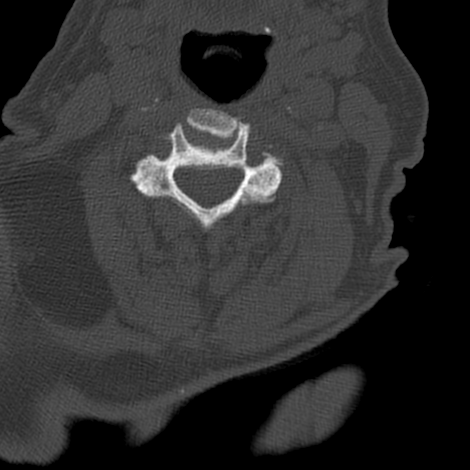
[im 70/93  bone]
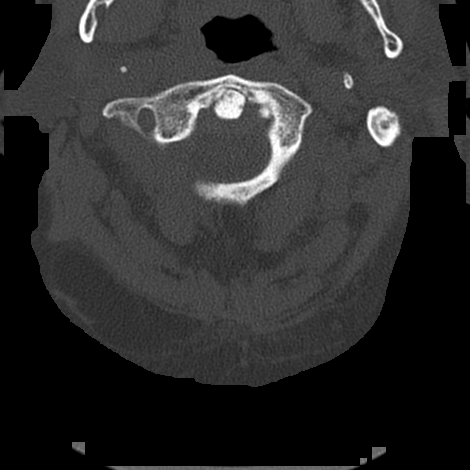

[14 of 33 positions shown; findings below may reference images not displayed]

FINDINGS: Alignment: No spondylolisthesis is noted.

Skull base and vertebrae: No definite fracture is noted.

Soft tissues and spinal canal: Large lipoma is seen in right
posterior portion of the neck.

Disc levels: Moderate degenerative disc disease is noted at C4-5,
C5-6, C6-7 and C7-T1.

Upper chest: Visualized lung apices are unremarkable.

Other: Degenerative changes seen involving posterior facet joints
bilaterally.
IMPRESSION: Multilevel degenerative disc disease. No fracture or significant
spondylolisthesis is noted. Large lipoma seen in the right posterior
soft tissues of the neck.

## 2018-03-08 ENCOUNTER — Other Ambulatory Visit: Payer: Self-pay | Admitting: Family Medicine

## 2018-03-08 DIAGNOSIS — I10 Essential (primary) hypertension: Secondary | ICD-10-CM

## 2018-03-19 ENCOUNTER — Other Ambulatory Visit: Payer: Self-pay | Admitting: Family Medicine

## 2018-03-21 ENCOUNTER — Other Ambulatory Visit: Payer: Self-pay | Admitting: Family Medicine

## 2018-03-29 ENCOUNTER — Other Ambulatory Visit: Payer: Self-pay | Admitting: Family Medicine

## 2018-04-07 ENCOUNTER — Other Ambulatory Visit: Payer: Self-pay | Admitting: Family Medicine

## 2018-04-07 DIAGNOSIS — H33193 Other retinoschisis and retinal cysts, bilateral: Secondary | ICD-10-CM | POA: Diagnosis not present

## 2018-04-07 DIAGNOSIS — H353122 Nonexudative age-related macular degeneration, left eye, intermediate dry stage: Secondary | ICD-10-CM | POA: Diagnosis not present

## 2018-04-21 ENCOUNTER — Encounter: Payer: Self-pay | Admitting: Family Medicine

## 2018-04-21 ENCOUNTER — Ambulatory Visit (INDEPENDENT_AMBULATORY_CARE_PROVIDER_SITE_OTHER): Payer: Medicare Other | Admitting: Family Medicine

## 2018-04-21 VITALS — BP 154/82 | HR 73 | Temp 96.6°F | Ht 66.0 in | Wt 178.0 lb

## 2018-04-21 DIAGNOSIS — I10 Essential (primary) hypertension: Secondary | ICD-10-CM

## 2018-04-21 DIAGNOSIS — I480 Paroxysmal atrial fibrillation: Secondary | ICD-10-CM | POA: Diagnosis not present

## 2018-04-21 DIAGNOSIS — N401 Enlarged prostate with lower urinary tract symptoms: Secondary | ICD-10-CM

## 2018-04-21 DIAGNOSIS — R3914 Feeling of incomplete bladder emptying: Secondary | ICD-10-CM | POA: Diagnosis not present

## 2018-04-21 DIAGNOSIS — D7589 Other specified diseases of blood and blood-forming organs: Secondary | ICD-10-CM

## 2018-04-21 DIAGNOSIS — R3989 Other symptoms and signs involving the genitourinary system: Secondary | ICD-10-CM

## 2018-04-21 DIAGNOSIS — F0390 Unspecified dementia without behavioral disturbance: Secondary | ICD-10-CM

## 2018-04-21 DIAGNOSIS — F03A0 Unspecified dementia, mild, without behavioral disturbance, psychotic disturbance, mood disturbance, and anxiety: Secondary | ICD-10-CM

## 2018-04-21 DIAGNOSIS — Z23 Encounter for immunization: Secondary | ICD-10-CM

## 2018-04-21 DIAGNOSIS — E538 Deficiency of other specified B group vitamins: Secondary | ICD-10-CM

## 2018-04-21 LAB — URINALYSIS
Bilirubin, UA: NEGATIVE
Glucose, UA: NEGATIVE
Ketones, UA: NEGATIVE
Leukocytes, UA: NEGATIVE
Nitrite, UA: NEGATIVE
Protein, UA: NEGATIVE
RBC, UA: NEGATIVE
Specific Gravity, UA: 1.015 (ref 1.005–1.030)
Urobilinogen, Ur: 0.2 mg/dL (ref 0.2–1.0)
pH, UA: 6 (ref 5.0–7.5)

## 2018-04-21 MED ORDER — ISOSORBIDE MONONITRATE ER 30 MG PO TB24: 30.0000 mg | ORAL_TABLET | Freq: Every day | ORAL | 1 refills | Status: DC

## 2018-04-21 MED ORDER — METOPROLOL TARTRATE 25 MG PO TABS: 25.0000 mg | ORAL_TABLET | Freq: Two times a day (BID) | ORAL | 1 refills | Status: DC

## 2018-04-21 MED ORDER — FUROSEMIDE 20 MG PO TABS: ORAL_TABLET | ORAL | 1 refills | Status: DC

## 2018-04-21 MED ORDER — CELECOXIB 400 MG PO CAPS: ORAL_CAPSULE | ORAL | 1 refills | Status: DC

## 2018-04-21 MED ORDER — POTASSIUM CHLORIDE CRYS ER 20 MEQ PO TBCR: 20.0000 meq | EXTENDED_RELEASE_TABLET | Freq: Every day | ORAL | 1 refills | Status: DC

## 2018-04-21 MED ORDER — TAMSULOSIN HCL 0.4 MG PO CAPS: 0.8000 mg | ORAL_CAPSULE | Freq: Every day | ORAL | 1 refills | Status: DC

## 2018-04-21 MED ORDER — APIXABAN 5 MG PO TABS: 5.0000 mg | ORAL_TABLET | Freq: Two times a day (BID) | ORAL | 1 refills | Status: DC

## 2018-04-21 MED ORDER — ROSUVASTATIN CALCIUM 5 MG PO TABS: 5.0000 mg | ORAL_TABLET | Freq: Every day | ORAL | 1 refills | Status: DC

## 2018-04-21 NOTE — Progress Notes (Signed)
Subjective:  Patient ID: Brad [name], male    DOB: 01-20-1928  Age: 84 y.o. MRN: [id number]  CC: Hypertension (6 month follow up) and Hyperlipidemia   HPI Brad [name] presents for follow-up of hypertension. Patient has no history of headache chest pain or shortness of breath or recent cough. Patient also denies symptoms of TIA such as numbness weakness lateralizing. Patient checks  blood pressure at home and has not had any elevated readings recently. Patient denies side effects from his medication. States taking it regularly.  He  Patient in for follow-up of elevated cholesterol. Doing well without complaints on current medication. Denies side effects of statin including myalgia and arthralgia and nausea. Also in today for liver function testing. Currently no chest pain, shortness of breath or other cardiovascular related symptoms noted.  Patient in for follow-up of atrial fibrillation. Patient denies any recent bouts of chest pain or palpitations.    Depression screen  [facility] 2/9 04/21/2018 11/27/2017 10/27/2017  Decreased Interest 0 0 0  Down, Depressed, Hopeless 0 0 0  PHQ - 2 Score 0 0 0  Some recent data might be hidden    History Vonnie has a past medical history of Ascending aortic aneurysm ([place]), Atrial fibrillation ([place]) (11/21/2015), CAD (coronary artery disease), Chronic lower back pain, COPD (chronic obstructive pulmonary disease) ([place]), DJD (degenerative joint disease), Enlarged prostate, Ethanolism ([place]), GERD (gastroesophageal reflux disease), Hernia, abdominal, HLD (hyperlipidemia), HTN (hypertension), Macular degeneration, Pacemaker, PAD (peripheral artery disease) ([place]), Paroxysmal atrial fibrillation ([place]), Psoriasis, S/P AAA repair, Scoliosis, Sinus node dysfunction ([place]), Tobacco abuse, and Unilateral congenital absence of kidney.   He has a past surgical history that includes Appendectomy; RESECTION AND GRAFTING OF INFARENAL  ABDOMINAL  AORTIC ANEURYSM (05/03/2002);  Carpal tunnel release (Right, 12/26/2014); Insert / replace / remove pacemaker (11/20/2015); Cataract extraction, bilateral (Bilateral); Coronary angioplasty with stent; Cardiac catheterization (N/A, 11/20/2015); and Hernia repair.   His family history includes AAA (abdominal aortic aneurysm) in his son; Arthritis in his son; COPD in his brother and sister; Cancer in his brother, mother, sister, and son; Early death in his brother and sister; Hypertension in his son; Stroke in his father and mother.He reports that he quit smoking about 21 years ago. His smoking use included cigarettes. He has a 130.00 pack-year smoking history. He has never used smokeless tobacco. He reports that he drinks alcohol. He reports that he does not use drugs.    ROS Review of Systems  Constitutional: Negative.   HENT: Negative.   Eyes: Negative for visual disturbance.  Respiratory: Negative for cough and shortness of breath.   Cardiovascular: Negative for chest pain and leg swelling.  Gastrointestinal: Negative for abdominal pain, diarrhea, nausea and vomiting.  Genitourinary: Positive for frequency. Negative for difficulty urinating and dysuria.  Musculoskeletal: Negative for arthralgias and myalgias.  Skin: Negative for rash.  Neurological: Negative for headaches.  Psychiatric/Behavioral: Negative for sleep disturbance.    Objective:  BP (!) 154/82   Pulse 73   Temp (!) 96.6 F (35.9 C) (Oral)   Ht 5' 6" (1.676 m)   Wt 178 lb (80.7 kg)   BMI 28.73 kg/m   BP Readings from Last 3 Encounters:  04/21/18 (!) 154/82  11/27/17 139/83  11/19/17 114/68    Wt Readings from Last 3 Encounters:  04/21/18 178 lb (80.7 kg)  11/27/17 176 lb 8 oz (80.1 kg)  11/19/17 176 lb (79.8 kg)     Physical Exam  Constitutional: He is oriented to person, place,  and time. He appears well-developed and well-nourished. No distress.  HENT:  Head: Normocephalic and atraumatic.  Right Ear: External ear normal.  Left Ear:  External ear normal.  Nose: Nose normal.  Mouth/Throat: Oropharynx is clear and moist.  Eyes: Pupils are equal, round, and reactive to light. Conjunctivae and EOM are normal.  Neck: Normal range of motion. Neck supple.  Cardiovascular: Normal rate, regular rhythm and normal heart sounds.  No murmur heard. Pulmonary/Chest: Effort normal and breath sounds normal. No respiratory distress. He has no wheezes. He has no rales.  Abdominal: Soft. There is no tenderness.  Musculoskeletal: Normal range of motion.  Neurological: He is alert and oriented to person, place, and time. He has normal reflexes.  Skin: Skin is warm and dry.  Psychiatric: He has a normal mood and affect. His behavior is normal. Judgment and thought content normal.      Assessment & Plan:   Brad [name] was seen today for hypertension and hyperlipidemia.  Diagnoses and all orders for this visit:  Essential hypertension -     furosemide (LASIX) 20 MG tablet; TAKE 1 TABLET BY MOUTH EVERY DAY IN THE MORNING -     potassium chloride SA (KLOR-CON M20) 20 MEQ tablet; Take 1 tablet (20 mEq total) by mouth daily. -     CBC with Differential/Platelet -     CMP14+EGFR  Mild dementia (HCC)  Urine troubles -     tamsulosin (FLOMAX) 0.4 MG CAPS capsule; Take 2 capsules (0.8 mg total) by mouth at bedtime. -     Urinalysis  Macrocytosis -     Vitamin B12  Paroxysmal atrial fibrillation (HCC)  Vitamin B 12 deficiency -     Vitamin B12  Benign prostatic hyperplasia with incomplete bladder emptying -     PSA, total and free  Encounter for immunization -     Flu vaccine HIGH DOSE PF  Other orders -     apixaban (ELIQUIS) 5 MG TABS tablet; Take 1 tablet (5 mg total) by mouth 2 (two) times daily. -     celecoxib (CELEBREX) 400 MG capsule; TAKE 1 CAPSULE (400 MG TOTAL) DAILY AFTER BREAKFAST. FOR FOOT, MUSCLE, JOINT PAIN AND SWELLING -     isosorbide mononitrate (IMDUR) 30 MG 24 hr tablet; Take 1 tablet (30 mg total) by mouth  daily. -     metoprolol tartrate (LOPRESSOR) 25 MG tablet; Take 1 tablet (25 mg total) by mouth 2 (two) times daily. -     rosuvastatin (CRESTOR) 5 MG tablet; Take 1 tablet (5 mg total) by mouth daily.       I have changed Brad [name]'s KLOR-CON M20 to potassium chloride SA. I have also changed his isosorbide mononitrate, metoprolol tartrate, and rosuvastatin. I am also having him maintain his fish oil-omega-3 fatty acids, TURMERIC CURCUMIN PO, acetaminophen, donepezil, multivitamin with minerals, fluticasone, fexofenadine, finasteride, furosemide, apixaban, tamsulosin, and celecoxib.  Allergies as of 04/21/2018      Reactions   Aspirin Shortness Of Breath, Swelling   Atorvastatin Other (See Comments)   Leg pain, feet pain   Cephalexin Other (See Comments)   unknown      Medication List        Accurate as of 04/21/18  8:48 PM. Always use your most recent med list.          apixaban 5 MG Tabs tablet Commonly known as:  ELIQUIS Take 1 tablet (5 mg total) by mouth 2 (two) times daily.   celecoxib 400   MG capsule Commonly known as:  CELEBREX TAKE 1 CAPSULE (400 MG TOTAL) DAILY AFTER BREAKFAST. FOR FOOT, MUSCLE, JOINT PAIN AND SWELLING   donepezil 10 MG tablet Commonly known as:  ARICEPT Take 1 tablet daily   fexofenadine 180 MG tablet Commonly known as:  ALLEGRA TAKE 1 TABLET (180 MG TOTAL) BY MOUTH DAILY. FOR ALLERGY SYMPTOMS   finasteride 5 MG tablet Commonly known as:  PROSCAR TAKE 1 TABLET BY MOUTH EVERY DAY   fish oil-omega-3 fatty acids 1000 MG capsule Take 1 g by mouth daily.   fluticasone 50 MCG/ACT nasal spray Commonly known as:  FLONASE SPRAY 2 SPRAYS INTO EACH NOSTRIL EVERY DAY   furosemide 20 MG tablet Commonly known as:  LASIX TAKE 1 TABLET BY MOUTH EVERY DAY IN THE MORNING   isosorbide mononitrate 30 MG 24 hr tablet Commonly known as:  IMDUR Take 1 tablet (30 mg total) by mouth daily.   metoprolol tartrate 25 MG tablet Commonly known as:   LOPRESSOR Take 1 tablet (25 mg total) by mouth 2 (two) times daily.   multivitamin with minerals Tabs tablet Take 1 tablet by mouth daily. PRESER VISION   potassium chloride SA 20 MEQ tablet Commonly known as:  K-DUR,KLOR-CON Take 1 tablet (20 mEq total) by mouth daily.   rosuvastatin 5 MG tablet Commonly known as:  CRESTOR Take 1 tablet (5 mg total) by mouth daily.   tamsulosin 0.4 MG Caps capsule Commonly known as:  FLOMAX Take 2 capsules (0.8 mg total) by mouth at bedtime.   TURMERIC CURCUMIN PO Take 1 tablet by mouth daily.   TYLENOL ARTHRITIS PAIN 650 MG CR tablet Generic drug:  acetaminophen Take 650 mg by mouth every 8 (eight) hours as needed for pain.        Follow-up: Return in about 6 months (around 10/21/2018).   , M.D. 

## 2018-04-22 LAB — CBC WITH DIFFERENTIAL/PLATELET
BASOS: 0 %
Basophils Absolute: 0 10*3/uL (ref 0.0–0.2)
EOS (ABSOLUTE): 0.2 10*3/uL (ref 0.0–0.4)
Eos: 4 %
HEMATOCRIT: 35.8 % — AB (ref 37.5–51.0)
Hemoglobin: 11.6 g/dL — ABNORMAL LOW (ref 13.0–17.7)
Immature Grans (Abs): 0 10*3/uL (ref 0.0–0.1)
Immature Granulocytes: 0 %
Lymphocytes Absolute: 1.5 10*3/uL (ref 0.7–3.1)
Lymphs: 29 %
MCH: 32.2 pg (ref 26.6–33.0)
MCHC: 32.4 g/dL (ref 31.5–35.7)
MCV: 99 fL — ABNORMAL HIGH (ref 79–97)
Monocytes Absolute: 0.5 10*3/uL (ref 0.1–0.9)
Monocytes: 9 %
Neutrophils Absolute: 3 10*3/uL (ref 1.4–7.0)
Neutrophils: 58 %
Platelets: 176 10*3/uL (ref 150–450)
RBC: 3.6 x10E6/uL — ABNORMAL LOW (ref 4.14–5.80)
RDW: 11.8 % — ABNORMAL LOW (ref 12.3–15.4)
WBC: 5.1 10*3/uL (ref 3.4–10.8)

## 2018-04-22 LAB — CMP14+EGFR
A/G RATIO: 1.9 (ref 1.2–2.2)
ALT: 10 IU/L (ref 0–44)
AST: 12 IU/L (ref 0–40)
Albumin: 4.4 g/dL (ref 3.5–4.7)
Alkaline Phosphatase: 55 IU/L (ref 39–117)
BUN/Creatinine Ratio: 23 (ref 10–24)
BUN: 19 mg/dL (ref 8–27)
Bilirubin Total: 0.6 mg/dL (ref 0.0–1.2)
CO2: 25 mmol/L (ref 20–29)
Calcium: 9 mg/dL (ref 8.6–10.2)
Chloride: 104 mmol/L (ref 96–106)
Creatinine, Ser: 0.83 mg/dL (ref 0.76–1.27)
GFR calc Af Amer: 91 mL/min/{1.73_m2} (ref >59)
GFR calc non Af Amer: 79 mL/min/{1.73_m2} (ref >59)
Globulin, Total: 2.3 g/dL (ref 1.5–4.5)
Glucose: 105 mg/dL — ABNORMAL HIGH (ref 65–99)
POTASSIUM: 4.5 mmol/L (ref 3.5–5.2)
Sodium: 141 mmol/L (ref 134–144)
Total Protein: 6.7 g/dL (ref 6.0–8.5)

## 2018-04-22 LAB — PSA, TOTAL AND FREE
PSA, Free Pct: 35 %
PSA, Free: 0.07 ng/mL
Prostate Specific Ag, Serum: 0.2 ng/mL (ref 0.0–4.0)

## 2018-04-22 LAB — VITAMIN B12: Vitamin B-12: 275 pg/mL (ref 232–1245)

## 2018-05-06 ENCOUNTER — Ambulatory Visit (INDEPENDENT_AMBULATORY_CARE_PROVIDER_SITE_OTHER): Payer: Medicare Other

## 2018-05-06 DIAGNOSIS — I495 Sick sinus syndrome: Secondary | ICD-10-CM

## 2018-05-06 NOTE — Progress Notes (Signed)
Remote pacemaker transmission.   

## 2018-05-07 ENCOUNTER — Encounter: Payer: Self-pay | Admitting: Cardiology

## 2018-05-23 ENCOUNTER — Ambulatory Visit (INDEPENDENT_AMBULATORY_CARE_PROVIDER_SITE_OTHER): Payer: Medicare Other | Admitting: Family Medicine

## 2018-05-23 ENCOUNTER — Encounter: Payer: Self-pay | Admitting: Family Medicine

## 2018-05-23 VITALS — BP 139/80 | HR 78 | Temp 97.0°F | Ht 66.0 in | Wt 178.0 lb

## 2018-05-23 DIAGNOSIS — R3129 Other microscopic hematuria: Secondary | ICD-10-CM | POA: Diagnosis not present

## 2018-05-23 DIAGNOSIS — N3001 Acute cystitis with hematuria: Secondary | ICD-10-CM | POA: Diagnosis not present

## 2018-05-23 DIAGNOSIS — R410 Disorientation, unspecified: Secondary | ICD-10-CM | POA: Diagnosis not present

## 2018-05-23 MED ORDER — SULFAMETHOXAZOLE-TRIMETHOPRIM 800-160 MG PO TABS: 1.0000 | ORAL_TABLET | Freq: Two times a day (BID) | ORAL | 0 refills | Status: DC

## 2018-05-23 NOTE — Progress Notes (Signed)
BP 139/80   Pulse 78   Temp (!) 97 F (36.1 C) (Oral)   Ht 5\' 6"  (1.676 m)   Wt 178 lb (80.7 kg)   BMI 28.73 kg/m    Subjective:    Patient ID: Brad [name], male    DOB: [birth year], 85 y.o.   MRN: [id number]  HPI: Brad [name] is a 85 y.o. male presenting on 05/23/2018 for Altered Mental Status and Nocturia   HPI Patient is brought in by his son today that says he is having a little more confusion than he normally has and was concerned about maybe there was some kind of infection going on.  The son does say he has been urinating more frequently at night as well but the patient himself denies any burning or pain with urination that he can recall.  Patient is noted to have some confusion and trouble drawing full sentences and sometimes will forget midsentence but eventually is able to bring most things out that he is trying to say.  Patient does have some sinus congestion and does not know if that could be attributing but he has not had any fevers or chills or flank pain or abdominal pain and does not complain of sinus pain today in the office.  He is having some nasal drainage and congestion and a little more cough than he normally has.  His son is mainly concerned because he has been more confused.  His son denies him having any new weakness on one side or changes in his smile or any signs of what would look like a mini stroke or a stroke.  Relevant past medical, surgical, family and social history reviewed and updated as indicated. Interim medical history since our last visit reviewed. Allergies and medications reviewed and updated.  Review of Systems  Constitutional: Negative for chills and fever.  HENT: Positive for congestion.   Eyes: Negative for visual disturbance.  Respiratory: Positive for cough. Negative for shortness of breath and wheezing.   Cardiovascular: Negative for chest pain and leg swelling.  Gastrointestinal: Negative for abdominal pain, diarrhea, nausea and  vomiting.  Genitourinary: Positive for frequency. Negative for decreased urine volume, difficulty urinating, dysuria, flank pain, hematuria and urgency.  Musculoskeletal: Negative for back pain and gait problem.  Skin: Negative for rash.  All other systems reviewed and are negative.   Per HPI unless specifically indicated above   Allergies as of 05/23/2018      Reactions   Aspirin Shortness Of Breath, Swelling   Atorvastatin Other (See Comments)   Leg pain, feet pain   Cephalexin Other (See Comments)   unknown      Medication List       Accurate as of May 23, 2018 10:59 AM. Always use your most recent med list.        apixaban 5 MG Tabs tablet Commonly known as:  ELIQUIS Take 1 tablet (5 mg total) by mouth 2 (two) times daily.   celecoxib 400 MG capsule Commonly known as:  CELEBREX TAKE 1 CAPSULE (400 MG TOTAL) DAILY AFTER BREAKFAST. FOR FOOT, MUSCLE, JOINT PAIN AND SWELLING   donepezil 10 MG tablet Commonly known as:  ARICEPT Take 1 tablet daily   fexofenadine 180 MG tablet Commonly known as:  ALLEGRA TAKE 1 TABLET (180 MG TOTAL) BY MOUTH DAILY. FOR ALLERGY SYMPTOMS   finasteride 5 MG tablet Commonly known as:  PROSCAR TAKE 1 TABLET BY MOUTH EVERY DAY   fish oil-omega-3 fatty acids 1000  MG capsule Take 1 g by mouth daily.   fluticasone 50 MCG/ACT nasal spray Commonly known as:  FLONASE SPRAY 2 SPRAYS INTO EACH NOSTRIL EVERY DAY   furosemide 20 MG tablet Commonly known as:  LASIX TAKE 1 TABLET BY MOUTH EVERY DAY IN THE MORNING   isosorbide mononitrate 30 MG 24 hr tablet Commonly known as:  IMDUR Take 1 tablet (30 mg total) by mouth daily.   metoprolol tartrate 25 MG tablet Commonly known as:  LOPRESSOR Take 1 tablet (25 mg total) by mouth 2 (two) times daily.   multivitamin with minerals Tabs tablet Take 1 tablet by mouth daily. PRESER VISION   potassium chloride SA 20 MEQ tablet Commonly known as:  KLOR-CON M20 Take 1 tablet (20 mEq total) by  mouth daily.   rosuvastatin 5 MG tablet Commonly known as:  CRESTOR Take 1 tablet (5 mg total) by mouth daily.   sulfamethoxazole-trimethoprim 800-160 MG tablet Commonly known as:  BACTRIM DS,SEPTRA DS Take 1 tablet by mouth 2 (two) times daily.   tamsulosin 0.4 MG Caps capsule Commonly known as:  FLOMAX Take 2 capsules (0.8 mg total) by mouth at bedtime.   TURMERIC CURCUMIN PO Take 1 tablet by mouth daily.   TYLENOL ARTHRITIS PAIN 650 MG CR tablet Generic drug:  acetaminophen Take 650 mg by mouth every 8 (eight) hours as needed for pain.          Objective:    BP 139/80   Pulse 78   Temp (!) 97 F (36.1 C) (Oral)   Ht 5\' 6"  (1.676 m)   Wt 178 lb (80.7 kg)   BMI 28.73 kg/m   Wt Readings from Last 3 Encounters:  05/23/18 178 lb (80.7 kg)  04/21/18 178 lb (80.7 kg)  11/27/17 176 lb 8 oz (80.1 kg)    Physical Exam Vitals signs and nursing note reviewed.  Constitutional:      General: He is not in acute distress.    Appearance: He is well-developed. He is not diaphoretic.  HENT:     Right Ear: Tympanic membrane, ear canal and external ear normal. There is no impacted cerumen.     Left Ear: Tympanic membrane, ear canal and external ear normal. There is no impacted cerumen.     Nose: Nose normal.     Mouth/Throat:     Mouth: Mucous membranes are moist.     Pharynx: No oropharyngeal exudate or posterior oropharyngeal erythema.  Eyes:     General: No scleral icterus.    Extraocular Movements: Extraocular movements intact.     Conjunctiva/sclera: Conjunctivae normal.     Pupils: Pupils are equal, round, and reactive to light.  Neck:     Musculoskeletal: Neck supple.     Thyroid: No thyromegaly.  Cardiovascular:     Rate and Rhythm: Normal rate and regular rhythm.     Heart sounds: Normal heart sounds. No murmur.  Pulmonary:     Effort: Pulmonary effort is normal. No respiratory distress.     Breath sounds: Normal breath sounds. No wheezing.  Abdominal:      General: Abdomen is flat. Bowel sounds are normal. There is no distension.     Tenderness: There is no abdominal tenderness.  Musculoskeletal: Normal range of motion.  Lymphadenopathy:     Cervical: No cervical adenopathy.  Skin:    General: Skin is warm and dry.     Findings: No rash.  Neurological:     Mental Status: He is alert and oriented  to person, place, and time.     Cranial Nerves: Cranial nerves are intact.     Sensory: Sensation is intact.     Motor: Motor function is intact. No weakness or abnormal muscle tone.     Coordination: Coordination normal.  Psychiatric:        Behavior: Behavior normal.     Urinalysis dip: 3+ blood and 2+ protein, positive ictotest, will run culture    Assessment & Plan:   Problem List Items Addressed This Visit    None    Visit Diagnoses    Confusion    -  Primary   Relevant Medications   sulfamethoxazole-trimethoprim (BACTRIM DS,SEPTRA DS) 800-160 MG tablet   Other Relevant Orders   Urine Culture   Urinalysis   Acute cystitis with hematuria       Relevant Medications   sulfamethoxazole-trimethoprim (BACTRIM DS,SEPTRA DS) 800-160 MG tablet   Other Relevant Orders   Urine Culture   Urinalysis   Microscopic hematuria       Relevant Medications   sulfamethoxazole-trimethoprim (BACTRIM DS,SEPTRA DS) 800-160 MG tablet   Other Relevant Orders   Urine Culture   Urinalysis      With altered mental status confusion and dysuria and 3+ blood on urinalysis will run a culture on the urine but will also treat with an antibiotic and if his confusion continues he needs to follow-up with his PCP within a week or 2.  Also recommend to follow-up on PCP on repeat urinalysis to rule out blood in the future. Follow up plan: Return in about 2 weeks (around 06/06/2018), or if symptoms worsen or fail to improve, for Follow-up with PCP on hematuria and confusion.  Counseling provided for all of the vaccine components Orders Placed This Encounter    Procedures  . Urine Culture  . Urinalysis    Caryl Pina, MD [facility] 05/23/2018, 10:59 AM

## 2018-05-24 LAB — CUP PACEART REMOTE DEVICE CHECK
Battery Remaining Longevity: 74 mo
Battery Voltage: 3 V
Brady Statistic AP VP Percent: 95.15 %
Brady Statistic AS VP Percent: 0.22 %
Brady Statistic AS VS Percent: 2.56 %
Brady Statistic RA Percent Paced: 96.99 %
Brady Statistic RV Percent Paced: 95.4 %
Implantable Lead Implant Date: 20170703
Implantable Lead Implant Date: 20170703
Implantable Lead Location: 753859
Implantable Lead Location: 753860
Implantable Lead Model: 3830
Implantable Lead Model: 5076
Implantable Pulse Generator Implant Date: 20170703
Lead Channel Impedance Value: 304 Ohm
Lead Channel Impedance Value: 304 Ohm
Lead Channel Impedance Value: 399 Ohm
Lead Channel Impedance Value: 418 Ohm
Lead Channel Pacing Threshold Amplitude: 0.5 V
Lead Channel Pacing Threshold Amplitude: 1.25 V
Lead Channel Pacing Threshold Pulse Width: 0.4 ms
Lead Channel Pacing Threshold Pulse Width: 0.4 ms
Lead Channel Sensing Intrinsic Amplitude: 1.5 mV
Lead Channel Sensing Intrinsic Amplitude: 1.5 mV
Lead Channel Sensing Intrinsic Amplitude: 9.625 mV
Lead Channel Setting Pacing Amplitude: 1.5 V
Lead Channel Setting Pacing Amplitude: 2.5 V
Lead Channel Setting Pacing Pulse Width: 0.4 ms
Lead Channel Setting Sensing Sensitivity: 0.9 mV
MDC IDC MSMT LEADCHNL RV SENSING INTR AMPL: 9.625 mV
MDC IDC SESS DTM: 20191218153456
MDC IDC STAT BRADY AP VS PERCENT: 2.07 %

## 2018-05-25 LAB — URINALYSIS
Bilirubin, UA: NEGATIVE
Glucose, UA: NEGATIVE
Ketones, UA: NEGATIVE
Leukocytes, UA: NEGATIVE
Nitrite, UA: NEGATIVE
UUROB: 1 mg/dL (ref 0.2–1.0)
pH, UA: 6 (ref 5.0–7.5)

## 2018-05-26 LAB — URINE CULTURE

## 2018-06-08 ENCOUNTER — Encounter: Payer: Self-pay | Admitting: Family Medicine

## 2018-06-08 ENCOUNTER — Ambulatory Visit (INDEPENDENT_AMBULATORY_CARE_PROVIDER_SITE_OTHER): Payer: Medicare Other | Admitting: Family Medicine

## 2018-06-08 VITALS — BP 131/75 | HR 83 | Temp 95.8°F | Ht 66.0 in | Wt 177.2 lb

## 2018-06-08 DIAGNOSIS — F0280 Dementia in other diseases classified elsewhere without behavioral disturbance: Secondary | ICD-10-CM

## 2018-06-08 DIAGNOSIS — G309 Alzheimer's disease, unspecified: Secondary | ICD-10-CM | POA: Diagnosis not present

## 2018-06-08 DIAGNOSIS — N3001 Acute cystitis with hematuria: Secondary | ICD-10-CM

## 2018-06-08 LAB — URINALYSIS
Bilirubin, UA: NEGATIVE
Glucose, UA: NEGATIVE
Ketones, UA: NEGATIVE
Nitrite, UA: NEGATIVE
Protein, UA: NEGATIVE
Specific Gravity, UA: 1.02 (ref 1.005–1.030)
Urobilinogen, Ur: 0.2 mg/dL (ref 0.2–1.0)
pH, UA: 5.5 (ref 5.0–7.5)

## 2018-06-08 MED ORDER — MEMANTINE HCL ER 7 & 14 & 21 &28 MG PO CP24: ORAL_CAPSULE | ORAL | 0 refills | Status: DC

## 2018-06-08 NOTE — Progress Notes (Signed)
Subjective:  Patient ID: Brad [name], male    DOB: Jun 17, 1927  Age: 85 y.o. MRN: [id number]  CC: Dysuria (pt here today for 2 week follow up on confusion and dysuria)   HPI Brad [name] presents for recheck of dysuria.  The patient had been confused when he was here couple weeks ago and also having some burning with urination.  His urine at that time showed 3+ blood.  He was given an antibiotic and symptoms have cleared with regard to his bladder and urinary system.  However he seems to have continued to have his problems with memory.  His son is with him and gives history as does the patient.  He says a few days ago he found his father wandering around looking out in a field for his grandpa's old car.  His son says that that was given away over 15 years ago.  He seems to frequently have old memories pop up as if they were current events.  He continues to take the donepezil as is for the last 2 to 3 years since an event where he fell while in a river.  Patient says there has been some improvement but not adequate improvement with the urinary tract treatment.  Depression screen [facility] 2/9 06/08/2018 05/23/2018 04/21/2018  Decreased Interest 0 0 0  Down, Depressed, Hopeless 2 0 0  PHQ - 2 Score 2 0 0  Altered sleeping 2 - -  Tired, decreased energy 3 - -  Change in appetite 0 - -  Feeling bad or failure about yourself  1 - -  Trouble concentrating 3 - -  Moving slowly or fidgety/restless 0 - -  Suicidal thoughts 0 - -  PHQ-9 Score 11 - -  Some recent data might be hidden    History Brad [name] has a past medical history of Ascending aortic aneurysm ([place]), Atrial fibrillation ([place]) (11/21/2015), CAD (coronary artery disease), Chronic lower back pain, COPD (chronic obstructive pulmonary disease) ([place]), DJD (degenerative joint disease), Enlarged prostate, Ethanolism ([place]), GERD (gastroesophageal reflux disease), Hernia, abdominal, HLD (hyperlipidemia), HTN (hypertension), Macular degeneration, Pacemaker,  PAD (peripheral artery disease) ([place]), Paroxysmal atrial fibrillation ([place]), Psoriasis, S/P AAA repair, Scoliosis, Sinus node dysfunction ([place]), Tobacco abuse, and Unilateral congenital absence of kidney.   He has a past surgical history that includes Appendectomy; RESECTION AND GRAFTING OF INFARENAL  ABDOMINAL  AORTIC ANEURYSM (05/03/2002); Carpal tunnel release (Right, 12/26/2014); Insert / replace / remove pacemaker (11/20/2015); Cataract extraction, bilateral (Bilateral); Coronary angioplasty with stent; Cardiac catheterization (N/A, 11/20/2015); and Hernia repair.   His family history includes AAA (abdominal aortic aneurysm) in his son; Arthritis in his son; COPD in his brother and sister; Cancer in his brother, mother, sister, and son; Early death in his brother and sister; Hypertension in his son; Stroke in his father and mother.He reports that he quit smoking about 22 years ago. His smoking use included cigarettes. He has a 130.00 pack-year smoking history. He has never used smokeless tobacco. He reports current alcohol use. He reports that he does not use drugs.    ROS Review of Systems  Constitutional: Negative for fever.  Respiratory: Negative for shortness of breath.   Cardiovascular: Negative for chest pain.  Genitourinary: Negative for difficulty urinating and hematuria.  Musculoskeletal: Negative for arthralgias.  Skin: Negative for rash.  Psychiatric/Behavioral: Positive for confusion and decreased concentration. Negative for agitation, dysphoric mood and sleep disturbance. The patient is not nervous/anxious.     Objective:  BP 131/75  Pulse 83   Temp (!) 95.8 F (35.4 C) (Oral)   Ht 5\' 6"  (1.676 m)   Wt 177 lb 4 oz (80.4 kg)   BMI 28.61 kg/m   BP Readings from Last 3 Encounters:  06/08/18 131/75  05/23/18 139/80  04/21/18 (!) 154/82    Wt Readings from Last 3 Encounters:  06/08/18 177 lb 4 oz (80.4 kg)  05/23/18 178 lb (80.7 kg)  04/21/18 178 lb (80.7 kg)      Physical Exam Constitutional:      General: He is not in acute distress.    Appearance: He is well-developed.  HENT:     Head: Normocephalic and atraumatic.     Right Ear: External ear normal.     Left Ear: External ear normal.     Nose: Nose normal.  Eyes:     Conjunctiva/sclera: Conjunctivae normal.     Pupils: Pupils are equal, round, and reactive to light.  Neck:     Musculoskeletal: Normal range of motion and neck supple.  Cardiovascular:     Rate and Rhythm: Normal rate and regular rhythm.     Heart sounds: Normal heart sounds. No murmur.  Pulmonary:     Effort: Pulmonary effort is normal. No respiratory distress.     Breath sounds: Normal breath sounds. No wheezing or rales.  Abdominal:     Palpations: Abdomen is soft.     Tenderness: There is no abdominal tenderness.  Musculoskeletal: Normal range of motion.  Skin:    General: Skin is warm and dry.  Neurological:     Mental Status: He is alert. Mental status is at baseline.     Cranial Nerves: No cranial nerve deficit.     Sensory: No sensory deficit.     Coordination: Coordination normal.     Deep Tendon Reflexes: Reflexes are normal and symmetric. Reflexes normal.  Psychiatric:        Mood and Affect: Mood normal.        Behavior: Behavior normal.     Clock test performed.  Image to be scanned. Shws numbers clumped onto one side. No hands. Used a dot to signify time.  Assessment & Plan:   Brad [name] was seen today for dysuria.  Diagnoses and all orders for this visit:  Acute cystitis with hematuria -     Urinalysis -     Urine Culture  Dementia due to Alzheimer's disease ([place])  Other orders -     Memantine HCl ER (NAMENDA XR TITRATION PACK) 7 & 14 & 21 &28 MG CP24; Use as directed by label instructions       I have discontinued Brad [name]'s sulfamethoxazole-trimethoprim. I am also having him start on Memantine HCl ER. Additionally, I am having him maintain his fish oil-omega-3 fatty acids,  TURMERIC CURCUMIN PO, acetaminophen, donepezil, multivitamin with minerals, fluticasone, fexofenadine, finasteride, furosemide, potassium chloride SA, apixaban, tamsulosin, celecoxib, isosorbide mononitrate, metoprolol tartrate, and rosuvastatin.  Allergies as of 06/08/2018      Reactions   Aspirin Shortness Of Breath, Swelling   Atorvastatin Other (See Comments)   Leg pain, feet pain   Cephalexin Other (See Comments)   unknown      Medication List       Accurate as of June 08, 2018  2:00 PM. Always use your most recent med list.        apixaban 5 MG Tabs tablet Commonly known as:  ELIQUIS Take 1 tablet (5 mg total) by mouth 2 (two) times  daily.   celecoxib 400 MG capsule Commonly known as:  CELEBREX TAKE 1 CAPSULE (400 MG TOTAL) DAILY AFTER BREAKFAST. FOR FOOT, MUSCLE, JOINT PAIN AND SWELLING   donepezil 10 MG tablet Commonly known as:  ARICEPT Take 1 tablet daily   fexofenadine 180 MG tablet Commonly known as:  ALLEGRA TAKE 1 TABLET (180 MG TOTAL) BY MOUTH DAILY. FOR ALLERGY SYMPTOMS   finasteride 5 MG tablet Commonly known as:  PROSCAR TAKE 1 TABLET BY MOUTH EVERY DAY   fish oil-omega-3 fatty acids 1000 MG capsule Take 1 g by mouth daily.   fluticasone 50 MCG/ACT nasal spray Commonly known as:  FLONASE SPRAY 2 SPRAYS INTO EACH NOSTRIL EVERY DAY   furosemide 20 MG tablet Commonly known as:  LASIX TAKE 1 TABLET BY MOUTH EVERY DAY IN THE MORNING   isosorbide mononitrate 30 MG 24 hr tablet Commonly known as:  IMDUR Take 1 tablet (30 mg total) by mouth daily.   Memantine HCl ER 7 & 14 & 21 &28 MG Cp24 Commonly known as:  NAMENDA XR TITRATION PACK Use as directed by label instructions   metoprolol tartrate 25 MG tablet Commonly known as:  LOPRESSOR Take 1 tablet (25 mg total) by mouth 2 (two) times daily.   multivitamin with minerals Tabs tablet Take 1 tablet by mouth daily. PRESER VISION   potassium chloride SA 20 MEQ tablet Commonly known as:   KLOR-CON M20 Take 1 tablet (20 mEq total) by mouth daily.   rosuvastatin 5 MG tablet Commonly known as:  CRESTOR Take 1 tablet (5 mg total) by mouth daily.   tamsulosin 0.4 MG Caps capsule Commonly known as:  FLOMAX Take 2 capsules (0.8 mg total) by mouth at bedtime.   TURMERIC CURCUMIN PO Take 1 tablet by mouth daily.   TYLENOL ARTHRITIS PAIN 650 MG CR tablet Generic drug:  acetaminophen Take 650 mg by mouth every 8 (eight) hours as needed for pain.        Follow-up: Return in about 6 weeks (around 07/20/2018).  [name], M.D.

## 2018-06-10 LAB — URINE CULTURE: Organism ID, Bacteria: NO GROWTH

## 2018-06-12 ENCOUNTER — Other Ambulatory Visit: Payer: Self-pay | Admitting: Family Medicine

## 2018-06-12 ENCOUNTER — Telehealth: Payer: Self-pay | Admitting: Family Medicine

## 2018-06-12 MED ORDER — MEMANTINE HCL ER 7 & 14 & 21 &28 MG PO CP24: ORAL_CAPSULE | ORAL | 0 refills | Status: DC

## 2018-06-12 NOTE — Telephone Encounter (Signed)
[facility] [place] [facility] not [facility] Namenda in the titration pack. Rx sent to [facility] in [place] per son request.

## 2018-06-16 ENCOUNTER — Telehealth: Payer: Self-pay | Admitting: *Deleted

## 2018-06-16 ENCOUNTER — Other Ambulatory Visit: Payer: Self-pay | Admitting: Family Medicine

## 2018-06-16 MED ORDER — MEMANTINE HCL ER 7 MG PO CP24: ORAL_CAPSULE | ORAL | 0 refills | Status: DC

## 2018-06-16 NOTE — Telephone Encounter (Signed)
Pt's son ron aware of change

## 2018-06-16 NOTE — Telephone Encounter (Signed)
I sent in the requested prescription 

## 2018-06-16 NOTE — Telephone Encounter (Signed)
Fax from [facility] XR titration capsules Medication is on manuf back order till 07/15/18 Please advise on change & advise pt

## 2018-06-18 ENCOUNTER — Telehealth: Payer: Self-pay

## 2018-06-18 NOTE — Telephone Encounter (Signed)
Because of tapering of Namantine ER the pharmacy needs a RX for Memantine 28mg  once daily

## 2018-06-19 ENCOUNTER — Other Ambulatory Visit: Payer: Self-pay | Admitting: Family Medicine

## 2018-06-19 MED ORDER — MEMANTINE HCL ER 28 MG PO CP24: 28.0000 mg | ORAL_CAPSULE | Freq: Every day | ORAL | 2 refills | Status: DC

## 2018-06-23 ENCOUNTER — Telehealth: Payer: Self-pay | Admitting: Family Medicine

## 2018-06-23 NOTE — Telephone Encounter (Signed)
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

## 2018-06-24 ENCOUNTER — Telehealth: Payer: Self-pay | Admitting: Family Medicine

## 2018-06-24 NOTE — Telephone Encounter (Signed)
Please advise 

## 2018-06-26 ENCOUNTER — Other Ambulatory Visit: Payer: Self-pay | Admitting: Family Medicine

## 2018-06-26 MED ORDER — MEMANTINE HCL ER 21 MG PO CP24: 14.0000 mg | ORAL_CAPSULE | Freq: Every day | ORAL | 0 refills | Status: DC

## 2018-06-26 MED ORDER — MEMANTINE HCL ER 14 MG PO CP24: 14.0000 mg | ORAL_CAPSULE | Freq: Every day | ORAL | 0 refills | Status: DC

## 2018-06-26 NOTE — Telephone Encounter (Signed)
I sent in the requested prescription 

## 2018-06-27 ENCOUNTER — Other Ambulatory Visit: Payer: Self-pay | Admitting: Family Medicine

## 2018-07-01 DIAGNOSIS — Z029 Encounter for administrative examinations, unspecified: Secondary | ICD-10-CM

## 2018-07-16 ENCOUNTER — Other Ambulatory Visit: Payer: Self-pay | Admitting: Family Medicine

## 2018-07-17 ENCOUNTER — Other Ambulatory Visit: Payer: Self-pay | Admitting: Family Medicine

## 2018-07-17 ENCOUNTER — Telehealth: Payer: Self-pay | Admitting: Family Medicine

## 2018-07-17 NOTE — Telephone Encounter (Signed)
Pt son has called and said that the medication had been canceled according to the pharmacy and that we need to resend it in to the pharmacy.

## 2018-07-17 NOTE — Telephone Encounter (Signed)
Pt's son aware rx for Namenda 28mg  #30 with 2 refills [facility] and [facility] is [facility] and will call me back if he has any problems with them not having rx on file.

## 2018-07-17 NOTE — Telephone Encounter (Signed)
Pt son is wanting to talk to Dr [name] nurse about the memantine (NAMENDA XR) 28 MG CP24 24 hr capsule before the pts apt next week.

## 2018-07-20 ENCOUNTER — Ambulatory Visit (INDEPENDENT_AMBULATORY_CARE_PROVIDER_SITE_OTHER): Payer: Medicare Other | Admitting: Family Medicine

## 2018-07-20 ENCOUNTER — Encounter: Payer: Self-pay | Admitting: Family Medicine

## 2018-07-20 VITALS — BP 122/65 | HR 66 | Temp 96.9°F | Ht 66.0 in | Wt 180.0 lb

## 2018-07-20 DIAGNOSIS — F03A0 Unspecified dementia, mild, without behavioral disturbance, psychotic disturbance, mood disturbance, and anxiety: Secondary | ICD-10-CM

## 2018-07-20 DIAGNOSIS — F0390 Unspecified dementia without behavioral disturbance: Secondary | ICD-10-CM | POA: Diagnosis not present

## 2018-07-20 MED ORDER — MEMANTINE HCL ER 28 MG PO CP24: 28.0000 mg | ORAL_CAPSULE | Freq: Every day | ORAL | 0 refills | Status: DC

## 2018-07-20 NOTE — Progress Notes (Signed)
Subjective:  Patient ID: Brad [name], male    DOB: 1927-10-13  Age: 85 y.o. MRN: [id number]  CC: Medical Management of Chronic Issues (pt here today for 6 week recheck after starting Namenda and has now worked his way up to 28mg  )   HPI ALARIC GLADWIN presents for  [employer] of alzheimers treatment. He and his son, who accompanies him and provides history along with the patient, feel there has been significant improvement already even though he has only been at full, 28 mg dose of namenda for a couple of weeks.  Depression screen [facility] 2/9 07/20/2018 06/08/2018 05/23/2018  Decreased Interest 0 0 0  Down, Depressed, Hopeless 3 2 0  PHQ - 2 Score 3 2 0  Altered sleeping 0 2 -  Tired, decreased energy 3 3 -  Change in appetite 0 0 -  Feeling bad or failure about yourself  3 1 -  Trouble concentrating 3 3 -  Moving slowly or fidgety/restless 0 0 -  Suicidal thoughts 0 0 -  PHQ-9 Score 12 11 -  Some recent data might be hidden    History Art has a past medical history of Ascending aortic aneurysm ([place]), Atrial fibrillation ([place]) (11/21/2015), CAD (coronary artery disease), Chronic lower back pain, COPD (chronic obstructive pulmonary disease) ([place]), DJD (degenerative joint disease), Enlarged prostate, Ethanolism ([place]), GERD (gastroesophageal reflux disease), Hernia, abdominal, HLD (hyperlipidemia), HTN (hypertension), Macular degeneration, Pacemaker, PAD (peripheral artery disease) ([place]), Paroxysmal atrial fibrillation ([place]), Psoriasis, S/P AAA repair, Scoliosis, Sinus node dysfunction ([place]), Tobacco abuse, and Unilateral congenital absence of kidney.   He has a past surgical history that includes Appendectomy; RESECTION AND GRAFTING OF INFARENAL  ABDOMINAL  AORTIC ANEURYSM (05/03/2002); Carpal tunnel release (Right, 12/26/2014); Insert / replace / remove pacemaker (11/20/2015); Cataract extraction, bilateral (Bilateral); Coronary angioplasty with stent; Cardiac catheterization (N/A, 11/20/2015); and  Hernia repair.   His family history includes AAA (abdominal aortic aneurysm) in his son; Arthritis in his son; COPD in his brother and sister; Cancer in his brother, mother, sister, and son; Early death in his brother and sister; Hypertension in his son; Stroke in his father and mother.He reports that he quit smoking about 22 years ago. His smoking use included cigarettes. He has a 130.00 pack-year smoking history. He has never used smokeless tobacco. He reports current alcohol use. He reports that he does not use drugs.    ROS Review of Systems  Constitutional: Negative for fever.  Respiratory: Negative for shortness of breath.   Cardiovascular: Negative for chest pain.  Musculoskeletal: Negative for arthralgias.  Skin: Negative for rash.    Objective:  BP 122/65   Pulse 66   Temp (!) 96.9 F (36.1 C) (Oral)   Ht 5\' 6"  (1.676 m)   Wt 180 lb (81.6 kg)   BMI 29.05 kg/m   BP Readings from Last 3 Encounters:  07/20/18 122/65  06/08/18 131/75  05/23/18 139/80    Wt Readings from Last 3 Encounters:  07/20/18 180 lb (81.6 kg)  06/08/18 177 lb 4 oz (80.4 kg)  05/23/18 178 lb (80.7 kg)     Physical Exam Vitals signs reviewed.  Constitutional:      Appearance: He is well-developed.  HENT:     Head: Normocephalic and atraumatic.     Right Ear: External ear normal.     Left Ear: External ear normal.     Mouth/Throat:     Pharynx: No oropharyngeal exudate or posterior oropharyngeal erythema.  Eyes:  Pupils: Pupils are equal, round, and reactive to light.  Neck:     Musculoskeletal: Normal range of motion and neck supple.  Cardiovascular:     Rate and Rhythm: Normal rate and regular rhythm.     Heart sounds: No murmur.  Pulmonary:     Effort: No respiratory distress.     Breath sounds: Normal breath sounds.  Neurological:     Mental Status: He is alert and oriented to person, place, and time.       Assessment & Plan:   Darrol was seen today for medical  management of chronic issues.  Diagnoses and all orders for this visit:  Mild dementia ([place])  Other orders -     memantine (NAMENDA XR) 28 MG CP24 24 hr capsule; Take 1 capsule (28 mg total) by mouth daily.       I am having Savon P. Coutant maintain his fish oil-omega-3 fatty acids, TURMERIC CURCUMIN PO, acetaminophen, donepezil, multivitamin with minerals, fluticasone, fexofenadine, furosemide, potassium chloride SA, apixaban, tamsulosin, celecoxib, isosorbide mononitrate, metoprolol tartrate, rosuvastatin, finasteride, and memantine.  Allergies as of 07/20/2018      Reactions   Aspirin Shortness Of Breath, Swelling   Atorvastatin Other (See Comments)   Leg pain, feet pain   Cephalexin Other (See Comments)   unknown      Medication List       Accurate as of July 20, 2018 10:12 PM. Always use your most recent med list.        apixaban 5 MG Tabs tablet Commonly known as:  ELIQUIS Take 1 tablet (5 mg total) by mouth 2 (two) times daily.   celecoxib 400 MG capsule Commonly known as:  CELEBREX TAKE 1 CAPSULE (400 MG TOTAL) DAILY AFTER BREAKFAST. FOR FOOT, MUSCLE, JOINT PAIN AND SWELLING   donepezil 10 MG tablet Commonly known as:  ARICEPT Take 1 tablet daily   fexofenadine 180 MG tablet Commonly known as:  ALLEGRA TAKE 1 TABLET (180 MG TOTAL) BY MOUTH DAILY. FOR ALLERGY SYMPTOMS   finasteride 5 MG tablet Commonly known as:  PROSCAR TAKE 1 TABLET BY MOUTH EVERY DAY   fish oil-omega-3 fatty acids 1000 MG capsule Take 1 g by mouth daily.   fluticasone 50 MCG/ACT nasal spray Commonly known as:  FLONASE SPRAY 2 SPRAYS INTO EACH NOSTRIL EVERY DAY   furosemide 20 MG tablet Commonly known as:  LASIX TAKE 1 TABLET BY MOUTH EVERY DAY IN THE MORNING   isosorbide mononitrate 30 MG 24 hr tablet Commonly known as:  IMDUR Take 1 tablet (30 mg total) by mouth daily.   memantine 28 MG Cp24 24 hr capsule Commonly known as:  NAMENDA XR Take 1 capsule (28 mg total) by  mouth daily.   metoprolol tartrate 25 MG tablet Commonly known as:  LOPRESSOR Take 1 tablet (25 mg total) by mouth 2 (two) times daily.   multivitamin with minerals Tabs tablet Take 1 tablet by mouth daily. PRESER VISION   potassium chloride SA 20 MEQ tablet Commonly known as:  KLOR-CON M20 Take 1 tablet (20 mEq total) by mouth daily.   rosuvastatin 5 MG tablet Commonly known as:  CRESTOR Take 1 tablet (5 mg total) by mouth daily.   tamsulosin 0.4 MG Caps capsule Commonly known as:  FLOMAX Take 2 capsules (0.8 mg total) by mouth at bedtime.   TURMERIC CURCUMIN PO Take 1 tablet by mouth daily.   TYLENOL ARTHRITIS PAIN 650 MG CR tablet Generic drug:  acetaminophen Take 650 mg by  mouth every 8 (eight) hours as needed for pain.        Follow-up: Return in about 3 months (around 10/20/2018).  [name], M.D.

## 2018-08-01 ENCOUNTER — Other Ambulatory Visit: Payer: Self-pay | Admitting: Neurology

## 2018-08-01 DIAGNOSIS — F0390 Unspecified dementia without behavioral disturbance: Secondary | ICD-10-CM

## 2018-08-01 DIAGNOSIS — F03A0 Unspecified dementia, mild, without behavioral disturbance, psychotic disturbance, mood disturbance, and anxiety: Secondary | ICD-10-CM

## 2018-08-05 ENCOUNTER — Ambulatory Visit (INDEPENDENT_AMBULATORY_CARE_PROVIDER_SITE_OTHER): Payer: Medicare Other | Admitting: *Deleted

## 2018-08-05 ENCOUNTER — Other Ambulatory Visit: Payer: Self-pay

## 2018-08-05 DIAGNOSIS — I495 Sick sinus syndrome: Secondary | ICD-10-CM

## 2018-08-05 DIAGNOSIS — R001 Bradycardia, unspecified: Secondary | ICD-10-CM

## 2018-08-06 LAB — CUP PACEART REMOTE DEVICE CHECK
Battery Remaining Longevity: 66 mo
Battery Voltage: 3 V
Brady Statistic AP VP Percent: 92.61 %
Brady Statistic AP VS Percent: 2.57 %
Brady Statistic AS VP Percent: 0.07 %
Brady Statistic AS VS Percent: 4.75 %
Brady Statistic RA Percent Paced: 95.15 %
Brady Statistic RV Percent Paced: 92.71 %
Date Time Interrogation Session: 20200318143336
Implantable Lead Implant Date: 20170703
Implantable Lead Implant Date: 20170703
Implantable Lead Location: 753859
Implantable Lead Location: 753860
Implantable Lead Model: 3830
Lead Channel Impedance Value: 304 Ohm
Lead Channel Impedance Value: 304 Ohm
Lead Channel Impedance Value: 399 Ohm
Lead Channel Impedance Value: 418 Ohm
Lead Channel Pacing Threshold Amplitude: 0.625 V
Lead Channel Pacing Threshold Pulse Width: 0.4 ms
Lead Channel Pacing Threshold Pulse Width: 0.4 ms
Lead Channel Sensing Intrinsic Amplitude: 1.75 mV
Lead Channel Sensing Intrinsic Amplitude: 6.875 mV
Lead Channel Sensing Intrinsic Amplitude: 6.875 mV
Lead Channel Setting Pacing Amplitude: 1.5 V
Lead Channel Setting Pacing Amplitude: 2.5 V
Lead Channel Setting Pacing Pulse Width: 0.4 ms
Lead Channel Setting Sensing Sensitivity: 0.9 mV
MDC IDC MSMT LEADCHNL RA SENSING INTR AMPL: 1.75 mV
MDC IDC MSMT LEADCHNL RV PACING THRESHOLD AMPLITUDE: 1.25 V
MDC IDC PG IMPLANT DT: 20170703

## 2018-08-13 NOTE — Progress Notes (Signed)
Remote pacemaker transmission.   

## 2018-08-19 ENCOUNTER — Ambulatory Visit (INDEPENDENT_AMBULATORY_CARE_PROVIDER_SITE_OTHER): Payer: Medicare Other | Admitting: Physician Assistant

## 2018-08-19 ENCOUNTER — Other Ambulatory Visit: Payer: Self-pay

## 2018-08-19 ENCOUNTER — Encounter: Payer: Self-pay | Admitting: Physician Assistant

## 2018-08-19 VITALS — BP 167/89 | HR 74 | Temp 97.0°F | Ht 66.0 in | Wt 177.2 lb

## 2018-08-19 DIAGNOSIS — H6121 Impacted cerumen, right ear: Secondary | ICD-10-CM

## 2018-08-19 DIAGNOSIS — H6122 Impacted cerumen, left ear: Secondary | ICD-10-CM | POA: Diagnosis not present

## 2018-08-19 DIAGNOSIS — H6123 Impacted cerumen, bilateral: Secondary | ICD-10-CM

## 2018-08-19 NOTE — Progress Notes (Signed)
BP (!) 167/89    Pulse 74    Temp (!) 97 F (36.1 C) (Oral)    Ht _0  (1.676 m)    Wt 177 lb 3.2 oz (80.4 kg)    BMI 28.60 kg/m    Subjective:    Patient ID: Brad [name], male    DOB: 05-30-1927, 85 y.o.   MRN: [id number]  HPI: Brad [name] is a 85 y.o. male presenting on 08/19/2018 for decreased hearing  Just in the past day [name] has noticed decreased hearing bilaterally.  His son is here with him.  He notes that he has had impacted cerumen in the past and has had to have irrigation performed.  He is ready to have his ears cleaned out today.  Past Medical History:  Diagnosis Date   Ascending aortic aneurysm ([place])    Dr. Roxan Hockey   Atrial fibrillation ([place]) 11/21/2015   CAD (coronary artery disease)    s/p stent to OM2 in past;  Last LHC 3/05: EF 60%, left RA occluded, right RA ok, prox to mid LAD 60%, oD1 75-80%, oD2 small 75%, pOM1 50%, OM2 stent ok with 50% before stent, mRCA 30%, dRCA 30%, PDA 70%.  Last dobutamine myoview 5/06: no scar or ischemia, EF 60%.   Chronic lower back pain    COPD (chronic obstructive pulmonary disease) ([facility])    DJD (degenerative joint disease)    Enlarged prostate    Ethanolism ([facility])    GERD (gastroesophageal reflux disease)    Hernia, abdominal    "he has ~ 4; wears binder" (11/20/2015)   HLD (hyperlipidemia)    HTN (hypertension)    Last echo 1/07:  Normal LVF, LV thickness upper limits of normal, mild aortic root dilatation, mild to mod MAC, mild LAE, borderline RVH, mild RAE.   Macular degeneration    Pacemaker    DR Radford Pax   PAD (peripheral artery disease) ([facility])    Paroxysmal atrial fibrillation ([facility])    Psoriasis    S/P AAA repair    Scoliosis    Sinus node dysfunction ([facility])    a. s/p MDT dual chamber PPM with His Bundle pacing   Tobacco abuse    Unilateral congenital absence of kidney    Relevant past medical, surgical, family and social history reviewed and updated as indicated. Interim medical  history since our last visit reviewed. Allergies and medications reviewed and updated. DATA REVIEWED: CHART IN EPIC  Family History reviewed for pertinent findings.  Review of Systems  HENT: Positive for ear pain and hearing loss.     Allergies as of 08/19/2018      Reactions   Aspirin Shortness Of Breath, Swelling   Atorvastatin Other (See Comments)   Leg pain, feet pain   Cephalexin Other (See Comments)   unknown      Medication List       Accurate as of August 19, 2018  5:05 PM. Always use your most recent med list.        apixaban 5 MG Tabs tablet Commonly known as:  Eliquis Take 1 tablet (5 mg total) by mouth 2 (two) times daily.   celecoxib 400 MG capsule Commonly known as:  CELEBREX TAKE 1 CAPSULE (400 MG TOTAL) DAILY AFTER BREAKFAST. FOR FOOT, MUSCLE, JOINT PAIN AND SWELLING   donepezil 10 MG tablet Commonly known as:  ARICEPT TAKE 1 TABLET BY MOUTH EVERY DAY   fexofenadine 180 MG tablet Commonly known as:  ALLEGRA TAKE 1 TABLET (  180 MG TOTAL) BY MOUTH DAILY. FOR ALLERGY SYMPTOMS   finasteride 5 MG tablet Commonly known as:  PROSCAR TAKE 1 TABLET BY MOUTH EVERY DAY   fish oil-omega-3 fatty acids 1000 MG capsule Take 1 g by mouth daily.   fluticasone 50 MCG/ACT nasal spray Commonly known as:  FLONASE SPRAY 2 SPRAYS INTO EACH NOSTRIL EVERY DAY   furosemide 20 MG tablet Commonly known as:  LASIX TAKE 1 TABLET BY MOUTH EVERY DAY IN THE MORNING   isosorbide mononitrate 30 MG 24 hr tablet Commonly known as:  IMDUR Take 1 tablet (30 mg total) by mouth daily.   memantine 28 MG Cp24 24 hr capsule Commonly known as:  NAMENDA XR Take 1 capsule (28 mg total) by mouth daily.   metoprolol tartrate 25 MG tablet Commonly known as:  LOPRESSOR Take 1 tablet (25 mg total) by mouth 2 (two) times daily.   multivitamin with minerals Tabs tablet Take 1 tablet by mouth daily. PRESER VISION   potassium chloride SA 20 MEQ tablet Commonly known as:  [place]-Con  M20 Take 1 tablet (20 mEq total) by mouth daily.   rosuvastatin 5 MG tablet Commonly known as:  CRESTOR Take 1 tablet (5 mg total) by mouth daily.   tamsulosin 0.4 MG Caps capsule Commonly known as:  FLOMAX Take 2 capsules (0.8 mg total) by mouth at bedtime.   TURMERIC CURCUMIN PO Take 1 tablet by mouth daily.   Tylenol Arthritis Pain 650 MG CR tablet Generic drug:  acetaminophen Take 650 mg by mouth every 8 (eight) hours as needed for pain.          Objective:    BP (!) 167/89    Pulse 74    Temp (!) 97 F (36.1 C) (Oral)    Ht _0  (1.676 m)    Wt 177 lb 3.2 oz (80.4 kg)    BMI 28.60 kg/m   Allergies  Allergen Reactions   Aspirin Shortness Of Breath and Swelling   Atorvastatin Other (See Comments)    Leg pain, feet pain   Cephalexin Other (See Comments)    unknown    Wt Readings from Last 3 Encounters:  08/19/18 177 lb 3.2 oz (80.4 kg)  07/20/18 180 lb (81.6 kg)  06/08/18 177 lb 4 oz (80.4 kg)    Physical Exam Vitals signs and nursing note reviewed.  Constitutional:      General: He is not in acute distress.    Appearance: He is well-developed.  HENT:     Head: Normocephalic and atraumatic.     Ears:     Comments: Bilateral impacted cerumen, cerumen is soft. Eyes:     Conjunctiva/sclera: Conjunctivae normal.     Pupils: Pupils are equal, round, and reactive to light.  Pulmonary:     Effort: No respiratory distress.  Psychiatric:        Behavior: Behavior normal.         Assessment & Plan:   1. Bilateral impacted cerumen Both ears are successfully irrigated by the nurse.  There are clear canals bilaterally   Continue all other maintenance medications as listed above.  Follow up plan: Return if symptoms worsen or fail to improve.  Educational handout given for [place] PA-C [place] [address]  [place], [place] [address] [phone]   08/19/2018, 5:05 PM

## 2018-08-20 ENCOUNTER — Ambulatory Visit: Payer: Medicare Other | Admitting: Neurology

## 2018-09-04 ENCOUNTER — Other Ambulatory Visit: Payer: Self-pay | Admitting: Family Medicine

## 2018-09-21 ENCOUNTER — Other Ambulatory Visit: Payer: Self-pay | Admitting: Family Medicine

## 2018-10-02 ENCOUNTER — Other Ambulatory Visit: Payer: Self-pay | Admitting: Family Medicine

## 2018-10-02 ENCOUNTER — Encounter: Payer: Self-pay | Admitting: Family Medicine

## 2018-10-02 ENCOUNTER — Other Ambulatory Visit: Payer: Self-pay

## 2018-10-02 ENCOUNTER — Ambulatory Visit (INDEPENDENT_AMBULATORY_CARE_PROVIDER_SITE_OTHER): Payer: Medicare Other | Admitting: Family Medicine

## 2018-10-02 DIAGNOSIS — F0280 Dementia in other diseases classified elsewhere without behavioral disturbance: Secondary | ICD-10-CM

## 2018-10-02 DIAGNOSIS — N3001 Acute cystitis with hematuria: Secondary | ICD-10-CM

## 2018-10-02 DIAGNOSIS — G309 Alzheimer's disease, unspecified: Secondary | ICD-10-CM | POA: Diagnosis not present

## 2018-10-02 LAB — URINALYSIS, COMPLETE
Bilirubin, UA: NEGATIVE
Glucose, UA: NEGATIVE
Ketones, UA: NEGATIVE
Nitrite, UA: NEGATIVE
Protein,UA: NEGATIVE
Specific Gravity, UA: 1.02 (ref 1.005–1.030)
Urobilinogen, Ur: 0.2 mg/dL (ref 0.2–1.0)
pH, UA: 5 (ref 5.0–7.5)

## 2018-10-02 LAB — MICROSCOPIC EXAMINATION
Bacteria, UA: NONE SEEN
Epithelial Cells (non renal): NONE SEEN /hpf (ref 0–10)
RBC, Urine: >30 /hpf — AB (ref 0–2)
Renal Epithel, UA: NONE SEEN /hpf

## 2018-10-02 MED ORDER — CIPROFLOXACIN HCL 500 MG PO TABS: 500.0000 mg | ORAL_TABLET | Freq: Two times a day (BID) | ORAL | 0 refills | Status: AC

## 2018-10-02 NOTE — Progress Notes (Signed)
Virtual Visit via telephone Note Due to COVID-19, visit is conducted virtually and was requested by patient. This visit type was conducted due to national recommendations for restrictions regarding the COVID-19 Pandemic (e.g. social distancing) in an effort to limit this patient's exposure and mitigate transmission in our community. All issues noted in this document were discussed and addressed.  A physical exam was not performed with this format.   I connected with Brad [name] son who is on the HIPPA form on 10/02/18 at 1120 by telephone and verified that I am speaking with the correct person using two identifiers. Brad [name] is currently located at home and family is currently with them during visit. The provider, Monia Pouch, FNP is located in their office at time of visit.  I discussed the limitations, risks, security and privacy concerns of performing an evaluation and management service by telephone and the availability of in person appointments. I also discussed with the patient that there may be a patient responsible charge related to this service. The patient expressed understanding and agreed to proceed.  Subjective:  Patient ID: Brad [name], male    DOB: 02-05-1928, 85 y.o.   MRN: [id number]  Chief Complaint:  Altered Mental Status   HPI: Brad [name] is a 85 y.o. male presenting on 10/02/2018 for Altered Mental Status   Son reports pt has had increasing confusion over the last few days and he is concerned the pt has another UTI. States he has frequency but does not complain of anything. Pt has dementia. Son states he is eating and drinking. Denies fever or fatigue.   Altered Mental Status  This is a new problem. The current episode started in the past 7 days. The problem has been gradually worsening. Pertinent negatives include no chest pain, chills, fatigue, fever or weakness.     Relevant past medical, surgical, family, and social history reviewed and  updated as indicated.  Allergies and medications reviewed and updated.   Past Medical History:  Diagnosis Date  . Ascending aortic aneurysm (HCC)    Dr. Roxan Hockey  . Atrial fibrillation ([place]) 11/21/2015  . CAD (coronary artery disease)    s/p stent to OM2 in past;  Last LHC 3/05: EF 60%, left RA occluded, right RA ok, prox to mid LAD 60%, oD1 75-80%, oD2 small 75%, pOM1 50%, OM2 stent ok with 50% before stent, mRCA 30%, dRCA 30%, PDA 70%.  Last dobutamine myoview 5/06: no scar or ischemia, EF 60%.  . Chronic lower back pain   . COPD (chronic obstructive pulmonary disease) ([place])   . DJD (degenerative joint disease)   . Enlarged prostate   . 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 GERD (gastroesophageal reflux disease)   . Hernia, abdominal    "he has ~ 4; wears binder" (11/20/2015)  . HLD (hyperlipidemia)   . HTN (hypertension)    Last echo 1/07:  Normal LVF, LV thickness upper limits of normal, mild aortic root dilatation, mild to mod MAC, mild LAE, borderline RVH, mild RAE.  . Macular degeneration   . Pacemaker    DR Radford Pax  . PAD (peripheral artery disease) ([place])   . Paroxysmal atrial fibrillation (HCC)   . Psoriasis   . S/P AAA repair   . Scoliosis   . Sinus node dysfunction (HCC)    a. s/p MDT dual chamber PPM with His Bundle pacing  . Tobacco abuse   . Unilateral congenital absence of kidney     Past Surgical History:  Procedure Laterality Date  . APPENDECTOMY    . CARPAL TUNNEL RELEASE Right 12/26/2014   Procedure: RIGHT ENDOSCOPIC CARPAL TUNNEL SYNDROME RELEASE;  Surgeon: Milly Jakob, MD;  Location: [facility];  Service: Orthopedics;  Laterality: Right;  . CATARACT EXTRACTION, BILATERAL Bilateral   . CORONARY ANGIOPLASTY WITH STENT PLACEMENT     s/p stent to OM2 in past;   . EP IMPLANTABLE DEVICE N/A 11/20/2015   Procedure: Pacemaker Implant;  Surgeon: Evans Lance, MD;  Location: [facility];  Service: Cardiovascular;  Laterality: N/A;  . HERNIA REPAIR    .  INSERT / REPLACE / REMOVE PACEMAKER  11/20/2015  . RESECTION AND GRAFTING OF INFARENAL  ABDOMINAL  AORTIC ANEURYSM  05/03/2002    Social History   Socioeconomic History  . Marital status: Widowed    Spouse name: Not on file  . Number of children: 3  . Years of education: Not on file  . Highest education level: Not on file  Occupational History  . Occupation: retire    Comment: Event organiser  . Financial resource strain: Not on file  . Food insecurity:    Worry: Not on file    Inability: Not on file  . Transportation needs:    Medical: Not on file    Non-medical: Not on file  Tobacco Use  . Smoking status: Former Smoker    Packs/day: 2.50    Years: 52.00    Pack years: 130.00    Types: Cigarettes    Last attempt to quit: 05/06/1996    Years since quitting: 22.4  . Smokeless tobacco: Never Used  Substance and Sexual Activity  . Alcohol use: Yes    Comment: 11/20/2015 "used to drink heavily; quit 05/06/1996"  . Drug use: No  . Sexual activity: Never  Lifestyle  . Physical activity:    Days per week: Not on file    Minutes per session: Not on file  . Stress: Not on file  Relationships  . Social connections:    Talks on phone: Not on file    Gets together: Not on file    Attends religious service: Not on file    Active member of club or organization: Not on file    Attends meetings of clubs or organizations: Not on file    Relationship status: Not on file  . Intimate partner violence:    Fear of current or ex partner: Not on file    Emotionally abused: Not on file    Physically abused: Not on file    Forced sexual activity: Not on file  Other Topics Concern  . Not on file  Social History Narrative  . Not on file    Outpatient Encounter Medications as of 10/02/2018  Medication Sig  . acetaminophen (TYLENOL ARTHRITIS PAIN) 650 MG CR tablet Take 650 mg by mouth every 8 (eight) hours as needed for pain.  Marland Kitchen apixaban (ELIQUIS) 5 MG TABS tablet Take 1 tablet  (5 mg total) by mouth 2 (two) times daily.  . celecoxib (CELEBREX) 400 MG capsule TAKE 1 CAPSULE (400 MG TOTAL) DAILY AFTER BREAKFAST. FOR FOOT, MUSCLE, JOINT PAIN AND SWELLING  . ciprofloxacin (CIPRO) 500 MG tablet Take 1 tablet (500 mg total) by mouth 2 (two) times daily for 5 days.  Marland Kitchen donepezil (ARICEPT) 10 MG tablet TAKE 1 TABLET BY MOUTH EVERY DAY  . fexofenadine (ALLEGRA) 180 MG tablet TAKE 1 TABLET (180 MG TOTAL) BY MOUTH DAILY. FOR ALLERGY  SYMPTOMS  . finasteride (PROSCAR) 5 MG tablet TAKE 1 TABLET BY MOUTH EVERY DAY  . fish oil-omega-3 fatty acids 1000 MG capsule Take 1 g by mouth daily.    . fluticasone (FLONASE) 50 MCG/ACT nasal spray SPRAY 2 SPRAYS INTO EACH NOSTRIL EVERY DAY  . furosemide (LASIX) 20 MG tablet TAKE 1 TABLET BY MOUTH EVERY DAY IN THE MORNING  . isosorbide mononitrate (IMDUR) 30 MG 24 hr tablet TAKE 1 TABLET BY MOUTH EVERY DAY  . memantine (NAMENDA XR) 28 MG CP24 24 hr capsule Take 1 capsule (28 mg total) by mouth daily.  . metoprolol tartrate (LOPRESSOR) 25 MG tablet Take 1 tablet (25 mg total) by mouth 2 (two) times daily.  . Multiple Vitamin (MULTIVITAMIN WITH MINERALS) TABS tablet Take 1 tablet by mouth daily. PRESER VISION  . potassium chloride SA (KLOR-CON M20) 20 MEQ tablet Take 1 tablet (20 mEq total) by mouth daily.  . rosuvastatin (CRESTOR) 5 MG tablet Take 1 tablet (5 mg total) by mouth daily.  . tamsulosin (FLOMAX) 0.4 MG CAPS capsule Take 2 capsules (0.8 mg total) by mouth at bedtime.  . TURMERIC CURCUMIN PO Take 1 tablet by mouth daily.   No facility-administered encounter medications on file as of 10/02/2018.     Allergies  Allergen Reactions  . Aspirin Shortness Of Breath and Swelling  . Atorvastatin Other (See Comments)    Leg pain, feet pain  . Cephalexin Other (See Comments)    unknown    Review of Systems  Constitutional: Negative for chills, fatigue and fever.  Respiratory: Negative for shortness of breath.   Cardiovascular: Negative for  chest pain, palpitations and leg swelling.  Genitourinary: Positive for frequency.  Neurological: Negative for weakness.  Psychiatric/Behavioral: Positive for confusion (baseline due to dementia, but increasing per son).  All other systems reviewed and are negative.     Son brought urine sample to office. Urinalysis in office: 3+ blood, trace leukocytes    Observations/Objective: No vital signs or physical exam, this was a telephone or virtual health encounter.  Pt alert and oriented, answers all questions appropriately, and able to speak in full sentences.    Assessment and Plan: Angad was seen today for altered mental status.  Diagnoses and all orders for this visit:  Dementia due to Alzheimer's disease ([place]) Increasing confusion likely due to UTI. Will treat. Report any new or worsening symptoms.   Acute cystitis with hematuria Increase water intake. Medications as prescribed. Urine culture pending, will change therapy if warranted. Follow up in 1 week.  -     ciprofloxacin (CIPRO) 500 MG tablet; Take 1 tablet (500 mg total) by mouth 2 (two) times daily for 5 days. -     Urinalysis, Complete -     Urine Culture     Follow Up Instructions: Return in about 1 week (around 10/09/2018), or if symptoms worsen or fail to improve, for increased confusion.    I discussed the assessment and treatment plan with the patient. The patient was provided an opportunity to ask questions and all were answered. The patient agreed with the plan and demonstrated an understanding of the instructions.   The patient was advised to call back or seek an in-person evaluation if the symptoms worsen or if the condition fails to improve as anticipated.  The above assessment and management plan was discussed with the patient. The patient verbalized understanding of and has agreed to the management plan. Patient is aware to call the clinic if symptoms  persist or worsen. Patient is aware when to return to  the clinic for a follow-up visit. Patient educated on when it is appropriate to go to the emergency department.    I provided 15 minutes of non-face-to-face time during this encounter. The call started at 1120. The call ended at 1135. The other time was used for coordination of care.    Monia Pouch, FNP-C [facility] [address] [place], [place] [address] [phone]

## 2018-10-04 LAB — URINE CULTURE

## 2018-10-05 ENCOUNTER — Ambulatory Visit: Payer: Medicare Other | Admitting: Neurology

## 2018-10-07 ENCOUNTER — Encounter (HOSPITAL_COMMUNITY): Payer: Self-pay | Admitting: Emergency Medicine

## 2018-10-07 ENCOUNTER — Emergency Department (HOSPITAL_COMMUNITY): Payer: Medicare Other

## 2018-10-07 ENCOUNTER — Other Ambulatory Visit: Payer: Self-pay

## 2018-10-07 ENCOUNTER — Emergency Department (HOSPITAL_COMMUNITY)
Admission: EM | Admit: 2018-10-07 | Discharge: 2018-10-07 | Disposition: A | Discharge status: Home / Self Care | Payer: Medicare Other | Attending: Emergency Medicine | Admitting: Emergency Medicine

## 2018-10-07 DIAGNOSIS — Z87891 Personal history of nicotine dependence: Secondary | ICD-10-CM | POA: Insufficient documentation

## 2018-10-07 DIAGNOSIS — J449 Chronic obstructive pulmonary disease, unspecified: Secondary | ICD-10-CM | POA: Insufficient documentation

## 2018-10-07 DIAGNOSIS — Z950 Presence of cardiac pacemaker: Secondary | ICD-10-CM | POA: Insufficient documentation

## 2018-10-07 DIAGNOSIS — Z79899 Other long term (current) drug therapy: Secondary | ICD-10-CM | POA: Diagnosis not present

## 2018-10-07 DIAGNOSIS — Z8679 Personal history of other diseases of the circulatory system: Secondary | ICD-10-CM

## 2018-10-07 DIAGNOSIS — I4510 Unspecified right bundle-branch block: Secondary | ICD-10-CM | POA: Diagnosis not present

## 2018-10-07 DIAGNOSIS — I10 Essential (primary) hypertension: Secondary | ICD-10-CM | POA: Diagnosis not present

## 2018-10-07 DIAGNOSIS — R1033 Periumbilical pain: Secondary | ICD-10-CM | POA: Diagnosis not present

## 2018-10-07 DIAGNOSIS — R1030 Lower abdominal pain, unspecified: Secondary | ICD-10-CM | POA: Diagnosis not present

## 2018-10-07 DIAGNOSIS — R109 Unspecified abdominal pain: Secondary | ICD-10-CM

## 2018-10-07 DIAGNOSIS — M25511 Pain in right shoulder: Secondary | ICD-10-CM | POA: Diagnosis not present

## 2018-10-07 DIAGNOSIS — I2510 Atherosclerotic heart disease of native coronary artery without angina pectoris: Secondary | ICD-10-CM | POA: Diagnosis not present

## 2018-10-07 DIAGNOSIS — Z7901 Long term (current) use of anticoagulants: Secondary | ICD-10-CM | POA: Diagnosis not present

## 2018-10-07 DIAGNOSIS — I712 Thoracic aortic aneurysm, without rupture: Secondary | ICD-10-CM | POA: Diagnosis not present

## 2018-10-07 LAB — COMPREHENSIVE METABOLIC PANEL
ALT: 14 U/L (ref 0–44)
AST: 21 U/L (ref 15–41)
Albumin: 3.1 g/dL — ABNORMAL LOW (ref 3.5–5.0)
Alkaline Phosphatase: 41 U/L (ref 38–126)
Anion gap: 8 (ref 5–15)
BUN: 19 mg/dL (ref 8–23)
CO2: 23 mmol/L (ref 22–32)
Calcium: 8.7 mg/dL — ABNORMAL LOW (ref 8.9–10.3)
Chloride: 111 mmol/L (ref 98–111)
Creatinine, Ser: 0.99 mg/dL (ref 0.61–1.24)
GFR calc Af Amer: >60 mL/min (ref >60)
GFR calc non Af Amer: >60 mL/min (ref >60)
Glucose, Bld: 60 mg/dL — ABNORMAL LOW (ref 70–99)
Potassium: 4.4 mmol/L (ref 3.5–5.1)
Sodium: 142 mmol/L (ref 135–145)
Total Bilirubin: 0.7 mg/dL (ref 0.3–1.2)
Total Protein: 5.9 g/dL — ABNORMAL LOW (ref 6.5–8.1)

## 2018-10-07 LAB — URINALYSIS, ROUTINE W REFLEX MICROSCOPIC
Bilirubin Urine: NEGATIVE
Glucose, UA: NEGATIVE mg/dL
Ketones, ur: NEGATIVE mg/dL
Leukocytes,Ua: NEGATIVE
Nitrite: NEGATIVE
Protein, ur: 30 mg/dL — AB
RBC / HPF: >50 RBC/hpf — ABNORMAL HIGH (ref 0–5)
Specific Gravity, Urine: 1.019 (ref 1.005–1.030)
pH: 5 (ref 5.0–8.0)

## 2018-10-07 LAB — CBC WITH DIFFERENTIAL/PLATELET
Abs Immature Granulocytes: 0.01 10*3/uL (ref 0.00–0.07)
Basophils Absolute: 0 10*3/uL (ref 0.0–0.1)
Basophils Relative: 1 %
Eosinophils Absolute: 0.3 10*3/uL (ref 0.0–0.5)
Eosinophils Relative: 5 %
HCT: 35.9 % — ABNORMAL LOW (ref 39.0–52.0)
Hemoglobin: 11.5 g/dL — ABNORMAL LOW (ref 13.0–17.0)
Immature Granulocytes: 0 %
Lymphocytes Relative: 27 %
Lymphs Abs: 1.6 10*3/uL (ref 0.7–4.0)
MCH: 33 pg (ref 26.0–34.0)
MCHC: 32 g/dL (ref 30.0–36.0)
MCV: 102.9 fL — ABNORMAL HIGH (ref 80.0–100.0)
Monocytes Absolute: 0.6 10*3/uL (ref 0.1–1.0)
Monocytes Relative: 11 %
Neutro Abs: 3.3 10*3/uL (ref 1.7–7.7)
Neutrophils Relative %: 56 %
Platelets: 138 10*3/uL — ABNORMAL LOW (ref 150–400)
RBC: 3.49 MIL/uL — ABNORMAL LOW (ref 4.22–5.81)
RDW: 13.1 % (ref 11.5–15.5)
WBC: 5.8 10*3/uL (ref 4.0–10.5)
nRBC: 0 % (ref 0.0–0.2)

## 2018-10-07 LAB — TROPONIN I: Troponin I: <0.03 ng/mL (ref <0.03)

## 2018-10-07 MED ORDER — IOHEXOL 300 MG/ML  SOLN
100.0000 mL | Freq: Once | INTRAMUSCULAR | Status: AC | PRN
  Administered 2018-10-07: 100 mL via INTRAVENOUS

## 2018-10-07 NOTE — ED Notes (Signed)
CT awaiting lab results.  Pt and family updated on POC.  NAD noted.  VSS.

## 2018-10-07 NOTE — ED Triage Notes (Signed)
Pt arrives with son- pt lives at home alone and this morning he called son and stated he needed to go to the hospital. Pt states he had a sudden pain in his groin that radiated into his right shoulder and right upper back. Pt states the pain only last about 1 hour but while pain was occurring he was sweating and "felt like the worst pain of my life".

## 2018-10-07 NOTE — Discharge Instructions (Addendum)
Continue medications as previously prescribed.  Return to the emergency department if you develop worsening pain, high fever, bloody stool, difficulty breathing, or other new and concerning symptoms.

## 2018-10-07 NOTE — ED Notes (Signed)
Patient verbalizes understanding of discharge instructions. Opportunity for questioning and answering were provided. , patient discharged from ED. [name] ,son, here at bedside and will be taking patient home

## 2018-10-07 NOTE — ED Provider Notes (Signed)
[facility] EMERGENCY DEPARTMENT Provider Note   CSN: [id number] Arrival date & time: 10/07/18  1057    History   Chief Complaint Chief Complaint  Patient presents with  . Abdominal Pain    HPI Brad [name] is a 85 y.o. male.     Patient is an 85 year old male with a past medical history of coronary artery disease with prior stent, ascending aortic aneurysm with repair, COPD, hypertension presenting with complaints of abdominal pain.  This began this morning at approximately 1030 shortly after eating breakfast.  He describes severe pain in his lower abdomen radiating into his right shoulder.  This caused him to break out in a sweat.  It lasted approximately 1 hour, however has since resolved.  He denies any trouble with his bowel or bladder.  He denies any difficulty breathing.  The history is provided by the patient.  Abdominal Pain  Pain location:  Suprapubic Pain quality: stabbing   Pain radiates to:  R shoulder Pain severity:  Severe Onset quality:  Sudden Timing:  Constant Progression:  Resolved Chronicity:  New Relieved by:  Nothing Worsened by:  Nothing   Past Medical History:  Diagnosis Date  . Ascending aortic aneurysm (HCC)    Dr. Roxan Hockey  . Atrial fibrillation ([place]) 11/21/2015  . CAD (coronary artery disease)    s/p stent to OM2 in past;  Last LHC 3/05: EF 60%, left RA occluded, right RA ok, prox to mid LAD 60%, oD1 75-80%, oD2 small 75%, pOM1 50%, OM2 stent ok with 50% before stent, mRCA 30%, dRCA 30%, PDA 70%.  Last dobutamine myoview 5/06: no scar or ischemia, EF 60%.  . Chronic lower back pain   . COPD (chronic obstructive pulmonary disease) ([place])   . DJD (degenerative joint disease)   . Enlarged prostate   . 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 GERD (gastroesophageal reflux disease)   . Hernia, abdominal    "he has ~ 4; wears binder" (11/20/2015)  . HLD (hyperlipidemia)   . HTN (hypertension)    Last echo 1/07:  Normal LVF, LV thickness upper  limits of normal, mild aortic root dilatation, mild to mod MAC, mild LAE, borderline RVH, mild RAE.  . Macular degeneration   . Pacemaker    DR Radford Pax  . PAD (peripheral artery disease) ([place])   . Paroxysmal atrial fibrillation (HCC)   . Psoriasis   . S/P AAA repair   . Scoliosis   . Sinus node dysfunction (HCC)    a. s/p MDT dual chamber PPM with His Bundle pacing  . Tobacco abuse   . Unilateral congenital absence of kidney     Patient Active Problem List   Diagnosis Date Noted  . 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 Blood in urine 12/04/2016  . Mild dementia ([place]) 07/03/2016  . Atrial fibrillation ([place]) 11/21/2015  . Sinus node dysfunction ([place]) 11/20/2015  . BPH (benign prostatic hyperplasia) 07/07/2015  . Hyperlipemia 07/07/2015  . Ventral hernia 05/31/2013  . Carpal tunnel syndrome, bilateral 05/07/2011  . 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 COPD (chronic obstructive pulmonary disease) ([place])   . ADENOMATOUS COLONIC POLYP 04/18/2008  . Essential hypertension 04/18/2008  . Coronary atherosclerosis 04/18/2008  . AAA 04/18/2008  . PVD 04/18/2008  . GERD 04/18/2008  . DIVERTICULOSIS, COLON 04/18/2008    Past Surgical History:  Procedure Laterality Date  . APPENDECTOMY    . CARPAL TUNNEL RELEASE Right 12/26/2014   Procedure: RIGHT ENDOSCOPIC CARPAL TUNNEL SYNDROME RELEASE;  Surgeon: Milly Jakob, MD;  Location: [facility];  Service: Orthopedics;  Laterality: Right;  . CATARACT EXTRACTION, BILATERAL Bilateral   . CORONARY ANGIOPLASTY WITH STENT PLACEMENT     s/p stent to OM2 in past;   . EP IMPLANTABLE DEVICE N/A 11/20/2015   Procedure: Pacemaker Implant;  Surgeon: Evans Lance, MD;  Location: [facility];  Service: Cardiovascular;  Laterality: N/A;  . HERNIA REPAIR    . INSERT / REPLACE / REMOVE PACEMAKER  11/20/2015  . RESECTION AND GRAFTING OF INFARENAL  ABDOMINAL  AORTIC ANEURYSM  05/03/2002        Home Medications    Prior to  Admission medications   Medication Sig Start Date End Date Taking? Authorizing Provider  acetaminophen (TYLENOL ARTHRITIS PAIN) 650 MG CR tablet Take 650 mg by mouth every 8 (eight) hours as needed for pain.    [provider]  apixaban (ELIQUIS) 5 MG TABS tablet Take 1 tablet (5 mg total) by mouth 2 (two) times daily. 04/21/18   [name], MD  celecoxib (CELEBREX) 400 MG capsule TAKE 1 CAPSULE (400 MG TOTAL) DAILY AFTER BREAKFAST. FOR FOOT, MUSCLE, JOINT PAIN AND SWELLING 04/21/18   [name], MD  ciprofloxacin (CIPRO) 500 MG tablet Take 1 tablet (500 mg total) by mouth 2 (two) times daily for 5 days. 10/02/18 10/07/18  Baruch Gouty, FNP  donepezil (ARICEPT) 10 MG tablet TAKE 1 TABLET BY MOUTH EVERY DAY 08/03/18   Cameron Sprang, MD  fexofenadine (ALLEGRA) 180 MG tablet TAKE 1 TABLET (180 MG TOTAL) BY MOUTH DAILY. FOR ALLERGY SYMPTOMS 09/04/18   [name], MD  finasteride (PROSCAR) 5 MG tablet TAKE 1 TABLET BY MOUTH EVERY DAY 09/21/18   [name], MD  fish oil-omega-3 fatty acids 1000 MG capsule Take 1 g by mouth daily.      [provider]  fluticasone (FLONASE) 50 MCG/ACT nasal spray SPRAY 2 SPRAYS INTO EACH NOSTRIL EVERY DAY 11/10/17   [name], MD  furosemide (LASIX) 20 MG tablet TAKE 1 TABLET BY MOUTH EVERY DAY IN THE MORNING 04/21/18   [name], MD  isosorbide mononitrate (IMDUR) 30 MG 24 hr tablet TAKE 1 TABLET BY MOUTH EVERY DAY 10/02/18   [name], MD  memantine (NAMENDA XR) 28 MG CP24 24 hr capsule Take 1 capsule (28 mg total) by mouth daily. 07/20/18   [name], MD  metoprolol tartrate (LOPRESSOR) 25 MG tablet Take 1 tablet (25 mg total) by mouth 2 (two) times daily. 04/21/18   [name], MD  Multiple Vitamin (MULTIVITAMIN WITH MINERALS) TABS tablet Take 1 tablet by mouth daily. [place]    [provider]  potassium chloride SA (KLOR-CON M20) 20 MEQ tablet Take 1 tablet (20 mEq total) by mouth daily. 04/21/18   [name], MD  rosuvastatin (CRESTOR) 5 MG tablet Take 1 tablet (5 mg total) by mouth daily. 04/21/18   [name], MD  tamsulosin (FLOMAX) 0.4 MG CAPS capsule Take 2 capsules (0.8 mg total) by mouth at bedtime. 04/21/18   [name], MD  TURMERIC CURCUMIN PO Take 1 tablet by mouth daily.    [provider]    Family History Family History  Problem Relation Age of Onset  . Cancer Mother        uterine  . Stroke Mother        hemi plagic  . Cancer Sister   . COPD Sister   . Early death Brother   .  Cancer Brother        Lip  . Stroke Father   . AAA (abdominal aortic aneurysm) Son   . Arthritis Son        knee  . COPD Brother   . Cancer Son        testicular   . Hypertension Son   . Early death Sister     Social History Social History   Tobacco Use  . Smoking status: Former Smoker    Packs/day: 2.50    Years: 52.00    Pack years: 130.00    Types: Cigarettes    Last attempt to quit: 05/06/1996    Years since quitting: 22.4  . Smokeless tobacco: Never Used  Substance Use Topics  . Alcohol use: Yes    Comment: 11/20/2015 "used to drink heavily; quit 05/06/1996"  . Drug use: No     Allergies   Aspirin; Atorvastatin; and Cephalexin   Review of Systems Review of Systems  Gastrointestinal: Positive for abdominal pain.  All other systems reviewed and are negative.    Physical Exam Updated Vital Signs BP 129/62 (BP Location: Right Arm)   Pulse 63   Temp 98.2 F (36.8 C) (Oral)   SpO2 96%   Physical Exam Vitals signs and nursing note reviewed.  Constitutional:      General: He is not in acute distress.    Appearance: He is well-developed. He is not diaphoretic.  HENT:     Head: Normocephalic and atraumatic.  Neck:     Musculoskeletal: Normal range of motion and neck supple.  Cardiovascular:     Rate and Rhythm: Normal rate and regular rhythm.     Heart sounds: No murmur. No friction rub.  Pulmonary:     Effort: Pulmonary effort is normal.  No respiratory distress.     Breath sounds: Normal breath sounds. No wheezing or rales.  Abdominal:     General: Bowel sounds are normal. There is no distension.     Palpations: Abdomen is soft.     Tenderness: There is no abdominal tenderness.  Musculoskeletal: Normal range of motion.  Skin:    General: Skin is warm and dry.  Neurological:     Mental Status: He is alert and oriented to person, place, and time.     Coordination: Coordination normal.      ED Treatments / Results  Labs (all labs ordered are listed, but only abnormal results are displayed) Labs Reviewed  COMPREHENSIVE METABOLIC PANEL  CBC WITH DIFFERENTIAL/PLATELET  TROPONIN I    EKG EKG Interpretation  Date/Time:  Wednesday Oct 07 2018 11:28:35 EDT Ventricular Rate:  67 PR Interval:    QRS Duration: 129 QT Interval:  444 QTC Calculation: 469 R Axis:   -56 Text Interpretation:  Sinus or ectopic atrial rhythm Prolonged PR interval RBBB and LAFB Confirmed by Veryl Speak [phone]) on 10/07/2018 11:42:43 AM   Radiology No results found.  Procedures Procedures (including critical care time)  Medications Ordered in ED Medications - No data to display   Initial Impression / Assessment and Plan / ED Course  I have reviewed the triage vital signs and the nursing notes.  Pertinent labs & imaging results that were available during my care of the patient were reviewed by me and considered in my medical decision making (see chart for details).  Patient presenting here with complaints of an episode of pain in his lower abdomen that radiated into his right shoulder.  The pain was severe and began  shortly after eating breakfast.  Patient's physical examination is unremarkable and work-up is also essentially normal.  CT scan of the chest, abdomen, and pelvis reveals no issue with the aorta or other identifiable pathology.  Patient does have a blood in the urine which according to the son is his baseline.  Based on the  nature of the complaint, this may have been a passed stone.  Patient has had an aortic aneurysm repair in the past and there appears to be no complications with this based on the CT scan.  At this point, patient is now symptom-free and will be discharged to home.  Final Clinical Impressions(s) / ED Diagnoses   Final diagnoses:  None    ED Discharge Orders    None       Veryl Speak, MD 10/07/18 1701

## 2018-10-13 ENCOUNTER — Other Ambulatory Visit: Payer: Self-pay | Admitting: *Deleted

## 2018-10-13 ENCOUNTER — Other Ambulatory Visit: Payer: Medicare Other

## 2018-10-13 ENCOUNTER — Other Ambulatory Visit: Payer: Self-pay

## 2018-10-13 ENCOUNTER — Telehealth: Payer: Self-pay | Admitting: Family Medicine

## 2018-10-13 DIAGNOSIS — R82998 Other abnormal findings in urine: Secondary | ICD-10-CM

## 2018-10-13 LAB — URINALYSIS
Bilirubin, UA: NEGATIVE
Glucose, UA: NEGATIVE
Ketones, UA: NEGATIVE
Leukocytes,UA: NEGATIVE
Nitrite, UA: NEGATIVE
Specific Gravity, UA: 1.025 (ref 1.005–1.030)
Urobilinogen, Ur: 0.2 mg/dL (ref 0.2–1.0)
pH, UA: 5.5 (ref 5.0–7.5)

## 2018-10-13 NOTE — Telephone Encounter (Signed)
Specimen bottle at front for patient's son .  He will bring in his dad's urine specimen for testing.

## 2018-10-14 ENCOUNTER — Telehealth: Payer: Self-pay | Admitting: Family Medicine

## 2018-10-14 NOTE — Telephone Encounter (Signed)
There was blood, but no infection on initial test. Awaiting culture result. Blood will make urine dark even if not infected. Needs urology follow up because blood has been present several times recently.

## 2018-10-14 NOTE — Telephone Encounter (Signed)
Fyi for provider.  Was medicine ordered for infection?

## 2018-10-14 NOTE — Telephone Encounter (Signed)
Aware.  Son will schedule follow up with his urologist.

## 2018-10-15 DIAGNOSIS — R310 Gross hematuria: Secondary | ICD-10-CM | POA: Diagnosis not present

## 2018-10-15 DIAGNOSIS — R350 Frequency of micturition: Secondary | ICD-10-CM | POA: Diagnosis not present

## 2018-10-15 LAB — URINE CULTURE

## 2018-10-21 ENCOUNTER — Other Ambulatory Visit: Payer: Self-pay

## 2018-10-21 ENCOUNTER — Encounter: Payer: Self-pay | Admitting: Family Medicine

## 2018-10-21 ENCOUNTER — Ambulatory Visit (INDEPENDENT_AMBULATORY_CARE_PROVIDER_SITE_OTHER): Payer: Medicare Other | Admitting: Family Medicine

## 2018-10-21 DIAGNOSIS — F0280 Dementia in other diseases classified elsewhere without behavioral disturbance: Secondary | ICD-10-CM | POA: Diagnosis not present

## 2018-10-21 DIAGNOSIS — K439 Ventral hernia without obstruction or gangrene: Secondary | ICD-10-CM | POA: Diagnosis not present

## 2018-10-21 DIAGNOSIS — I480 Paroxysmal atrial fibrillation: Secondary | ICD-10-CM

## 2018-10-21 DIAGNOSIS — E782 Mixed hyperlipidemia: Secondary | ICD-10-CM | POA: Diagnosis not present

## 2018-10-21 DIAGNOSIS — R319 Hematuria, unspecified: Secondary | ICD-10-CM | POA: Diagnosis not present

## 2018-10-21 DIAGNOSIS — J410 Simple chronic bronchitis: Secondary | ICD-10-CM | POA: Diagnosis not present

## 2018-10-21 DIAGNOSIS — G309 Alzheimer's disease, unspecified: Secondary | ICD-10-CM

## 2018-10-21 NOTE — Progress Notes (Signed)
Subjective:    Patient ID: Brad [name], male    DOB: 27-Jan-1928, 85 y.o.   MRN: [id number]   HPI: Brad [name] is a 85 y.o. male presenting for routine medical management. Had a fall and hit his head a few days ago. No LOC. Completely cleared now. Gets dizzy when he first stands up. Legs are weak.   Has had hematuria. Seeing urology. They feel there is a small chance of cancer in the bladder. He decided to opt out of testing.    Depression screen Brad [name] - [facility] 2/9 08/19/2018 07/20/2018 06/08/2018 05/23/2018 04/21/2018  Decreased Interest 0 0 0 0 0  Down, Depressed, Hopeless 0 3 2 0 0  PHQ - 2 Score 0 3 2 0 0  Altered sleeping - 0 2 - -  Tired, decreased energy - 3 3 - -  Change in appetite - 0 0 - -  Feeling bad or failure about yourself  - 3 1 - -  Trouble concentrating - 3 3 - -  Moving slowly or fidgety/restless - 0 0 - -  Suicidal thoughts - 0 0 - -  PHQ-9 Score - 12 11 - -  Some recent data might be hidden     Relevant past medical, surgical, family and social history reviewed and updated as indicated.  Interim medical history since our last visit reviewed. Allergies and medications reviewed and updated.  ROS:  Review of Systems  Constitutional: Negative.   HENT: Negative.   Eyes: Negative for visual disturbance.  Respiratory: Negative for cough and shortness of breath.   Cardiovascular: Negative for chest pain and leg swelling.  Gastrointestinal: Positive for abdominal pain (no change since E.R. evaluation. ). Negative for diarrhea, nausea and vomiting.  Genitourinary: Negative for difficulty urinating.  Musculoskeletal: Negative for arthralgias and myalgias.  Skin: Negative for rash.  Neurological: Negative for headaches.  Psychiatric/Behavioral: Negative for sleep disturbance.     Social History   Tobacco Use  Smoking Status Former Smoker  . Packs/day: 2.50  . Years: 52.00  . Pack years: 130.00  . Types: Cigarettes  . Last attempt to quit: 05/06/1996  . Years  since quitting: 22.4  Smokeless Tobacco Never Used       Objective:     Wt Readings from Last 3 Encounters:  08/19/18 177 lb 3.2 oz (80.4 kg)  07/20/18 180 lb (81.6 kg)  06/08/18 177 lb 4 oz (80.4 kg)     Exam deferred. Pt. Harboring due to COVID 19. Phone visit performed.   Assessment & Plan:   1. Dementia due to Alzheimer's disease (Brad [name])   2. Ventral hernia without obstruction or gangrene   3. Paroxysmal atrial fibrillation (HCC)   4. Simple chronic bronchitis (Brad [name])   5. Mixed hyperlipidemia   6. Hematuria, unspecified type     No orders of the defined types were placed in this encounter.   No orders of the defined types were placed in this encounter.     Diagnoses and all orders for this visit:  Dementia due to Alzheimer's disease (Brad [name])  Ventral hernia without obstruction or gangrene  Paroxysmal atrial fibrillation (HCC)  Simple chronic bronchitis (HCC)  Mixed hyperlipidemia  Hematuria, unspecified type    Virtual Visit via telephone Note  I discussed the limitations, risks, security and privacy concerns of performing an evaluation and management service by telephone and the availability of in person appointments. The patient was identified with two identifiers. Pt.expressed understanding and agreed to proceed. Pt. Is  at home. Dr. Livia [name] is in his office.  Follow Up Instructions:   I discussed the assessment and treatment plan with the patient. The patient was provided an opportunity to ask questions and all were answered. The patient agreed with the plan and demonstrated an understanding of the instructions.   The patient was advised to call back or seek an in-person evaluation if the symptoms worsen or if the condition fails to improve as anticipated.   Total minutes including chart review and phone contact time: 25   Follow up plan: Return in about 3 months (around 01/21/2019).  Brad Fraise, MD [place]

## 2018-10-24 ENCOUNTER — Other Ambulatory Visit: Payer: Self-pay | Admitting: Family Medicine

## 2018-10-27 ENCOUNTER — Other Ambulatory Visit: Payer: Self-pay | Admitting: Neurology

## 2018-10-27 DIAGNOSIS — F0390 Unspecified dementia without behavioral disturbance: Secondary | ICD-10-CM

## 2018-10-27 DIAGNOSIS — F03A0 Unspecified dementia, mild, without behavioral disturbance, psychotic disturbance, mood disturbance, and anxiety: Secondary | ICD-10-CM

## 2018-11-04 ENCOUNTER — Ambulatory Visit (INDEPENDENT_AMBULATORY_CARE_PROVIDER_SITE_OTHER): Payer: Medicare Other | Admitting: *Deleted

## 2018-11-04 DIAGNOSIS — I495 Sick sinus syndrome: Secondary | ICD-10-CM

## 2018-11-04 LAB — CUP PACEART REMOTE DEVICE CHECK
Battery Remaining Longevity: 63 mo
Battery Voltage: 3 V
Brady Statistic AP VP Percent: 96.76 %
Brady Statistic AP VS Percent: 1.78 %
Brady Statistic AS VP Percent: 0.02 %
Brady Statistic AS VS Percent: 1.44 %
Brady Statistic RA Percent Paced: 98.51 %
Brady Statistic RV Percent Paced: 96.84 %
Date Time Interrogation Session: 20200617104341
Implantable Lead Implant Date: 20170703
Implantable Lead Implant Date: 20170703
Implantable Lead Location: 753859
Implantable Lead Location: 753860
Implantable Lead Model: 3830
Implantable Lead Model: 5076
Implantable Pulse Generator Implant Date: 20170703
Lead Channel Impedance Value: 285 Ohm
Lead Channel Impedance Value: 304 Ohm
Lead Channel Impedance Value: 380 Ohm
Lead Channel Impedance Value: 418 Ohm
Lead Channel Pacing Threshold Amplitude: 0.625 V
Lead Channel Pacing Threshold Amplitude: 1.25 V
Lead Channel Pacing Threshold Pulse Width: 0.4 ms
Lead Channel Pacing Threshold Pulse Width: 0.4 ms
Lead Channel Sensing Intrinsic Amplitude: 0.75 mV
Lead Channel Sensing Intrinsic Amplitude: 0.75 mV
Lead Channel Sensing Intrinsic Amplitude: 5.5 mV
Lead Channel Sensing Intrinsic Amplitude: 5.5 mV
Lead Channel Setting Pacing Amplitude: 1.5 V
Lead Channel Setting Pacing Amplitude: 2.5 V
Lead Channel Setting Pacing Pulse Width: 0.4 ms
Lead Channel Setting Sensing Sensitivity: 0.9 mV

## 2018-11-18 ENCOUNTER — Encounter: Payer: Self-pay | Admitting: Cardiology

## 2018-11-18 NOTE — Progress Notes (Signed)
Remote pacemaker transmission.   

## 2018-11-23 ENCOUNTER — Telehealth: Payer: Self-pay | Admitting: Family Medicine

## 2018-11-23 NOTE — Telephone Encounter (Signed)
Aware , will need to schedule an appointment to be seen if rash gets worse.

## 2018-11-26 ENCOUNTER — Other Ambulatory Visit: Payer: Self-pay

## 2018-11-26 ENCOUNTER — Ambulatory Visit (INDEPENDENT_AMBULATORY_CARE_PROVIDER_SITE_OTHER): Payer: Medicare Other | Admitting: Family Medicine

## 2018-11-26 ENCOUNTER — Encounter: Payer: Self-pay | Admitting: Family Medicine

## 2018-11-26 VITALS — BP 157/86 | HR 80 | Temp 97.1°F | Ht 66.0 in | Wt 183.2 lb

## 2018-11-26 DIAGNOSIS — B372 Candidiasis of skin and nail: Secondary | ICD-10-CM | POA: Diagnosis not present

## 2018-11-26 MED ORDER — BUTENAFINE HCL 1 % EX CREA: TOPICAL_CREAM | CUTANEOUS | 5 refills | Status: DC

## 2018-11-26 NOTE — Progress Notes (Signed)
Chief Complaint  Patient presents with  . Rash    on abdomen    HPI  Patient presents today for pruritic red patches on abdomen. Present for a week or two, spreading. No fever. Denies tick bite. Appetite is normal. At baseline for joint pain. His son is with him and gives much of the history today. [name] is rather forgetful.   Office Visit from 11/26/2018 in [place]  PHQ-2 Total Score  0      PMH: Smoking status noted ROS: Per HPI  Objective: BP (!) 157/86   Pulse 80   Temp (!) 97.1 F (36.2 C) (Oral)   Ht 5\' 6"  (1.676 m)   Wt 183 lb 3.2 oz (83.1 kg)   BMI 29.57 kg/m  Gen: NAD, alert, cooperative with exam HEENT: NCAT, EOMI, PERRL CV: RRR, good S1/S2, no murmur Resp: CTABL, no wheezes, non-labored Abd: SNTND, BS present, no guarding. Papular erythema in central abdomen with scattered satelite papules.  Ext: No edema, warm Neuro: Alert and oriented, No gross deficits  Assessment and plan:  1. Yeast dermatitis     Meds ordered this encounter  Medications  . Butenafine HCl 1 % cream    Sig: Apply twice daily to affected areas    Dispense:  30 g    Refill:  5      Follow up as needed.  [name], MD

## 2018-11-27 ENCOUNTER — Other Ambulatory Visit: Payer: Self-pay | Admitting: Family Medicine

## 2018-11-27 DIAGNOSIS — I10 Essential (primary) hypertension: Secondary | ICD-10-CM

## 2018-11-28 ENCOUNTER — Other Ambulatory Visit: Payer: Self-pay | Admitting: Family Medicine

## 2018-11-28 DIAGNOSIS — I10 Essential (primary) hypertension: Secondary | ICD-10-CM

## 2018-12-04 ENCOUNTER — Encounter: Payer: Self-pay | Admitting: Family Medicine

## 2018-12-04 ENCOUNTER — Other Ambulatory Visit: Payer: Self-pay | Admitting: Family Medicine

## 2018-12-11 ENCOUNTER — Telehealth: Payer: Self-pay | Admitting: Family Medicine

## 2018-12-11 NOTE — Telephone Encounter (Signed)
If he is having rectal bleeding, he needs to be seen. If it is a large amount, he needs to go to the ED.

## 2018-12-11 NOTE — Telephone Encounter (Signed)
appt made for 12/14/18

## 2018-12-11 NOTE — Telephone Encounter (Signed)
Patient son states that after patient used the restroom this morning he noticed bright red blood on the toilet paper and in the toilet.

## 2018-12-11 NOTE — Telephone Encounter (Signed)
Pt notice blood in stool this morning. When he wiped it was bright red on paper. Denies any stomach pain, diarrhea, fever. Asking to speak with [facility] nurse/.

## 2018-12-13 ENCOUNTER — Other Ambulatory Visit: Payer: Self-pay

## 2018-12-13 ENCOUNTER — Encounter (HOSPITAL_COMMUNITY): Payer: Self-pay | Admitting: Emergency Medicine

## 2018-12-13 ENCOUNTER — Inpatient Hospital Stay (HOSPITAL_COMMUNITY)
Admission: EM | Admit: 2018-12-13 | Discharge: 2018-12-16 | DRG: 378 | Disposition: A | Discharge status: Home Health | Payer: Medicare Other | Attending: Internal Medicine | Admitting: Internal Medicine

## 2018-12-13 DIAGNOSIS — I495 Sick sinus syndrome: Secondary | ICD-10-CM | POA: Diagnosis present

## 2018-12-13 DIAGNOSIS — R791 Abnormal coagulation profile: Secondary | ICD-10-CM | POA: Diagnosis present

## 2018-12-13 DIAGNOSIS — K922 Gastrointestinal hemorrhage, unspecified: Secondary | ICD-10-CM

## 2018-12-13 DIAGNOSIS — D539 Nutritional anemia, unspecified: Secondary | ICD-10-CM | POA: Diagnosis present

## 2018-12-13 DIAGNOSIS — I10 Essential (primary) hypertension: Secondary | ICD-10-CM | POA: Diagnosis present

## 2018-12-13 DIAGNOSIS — F05 Delirium due to known physiological condition: Secondary | ICD-10-CM | POA: Diagnosis not present

## 2018-12-13 DIAGNOSIS — K219 Gastro-esophageal reflux disease without esophagitis: Secondary | ICD-10-CM | POA: Diagnosis present

## 2018-12-13 DIAGNOSIS — Q600 Renal agenesis, unilateral: Secondary | ICD-10-CM

## 2018-12-13 DIAGNOSIS — Z66 Do not resuscitate: Secondary | ICD-10-CM | POA: Diagnosis present

## 2018-12-13 DIAGNOSIS — K625 Hemorrhage of anus and rectum: Secondary | ICD-10-CM | POA: Diagnosis not present

## 2018-12-13 DIAGNOSIS — Z8679 Personal history of other diseases of the circulatory system: Secondary | ICD-10-CM

## 2018-12-13 DIAGNOSIS — J439 Emphysema, unspecified: Secondary | ICD-10-CM | POA: Diagnosis present

## 2018-12-13 DIAGNOSIS — I4891 Unspecified atrial fibrillation: Secondary | ICD-10-CM | POA: Diagnosis present

## 2018-12-13 DIAGNOSIS — D62 Acute posthemorrhagic anemia: Secondary | ICD-10-CM | POA: Diagnosis present

## 2018-12-13 DIAGNOSIS — N400 Enlarged prostate without lower urinary tract symptoms: Secondary | ICD-10-CM | POA: Diagnosis present

## 2018-12-13 DIAGNOSIS — G8929 Other chronic pain: Secondary | ICD-10-CM | POA: Diagnosis present

## 2018-12-13 DIAGNOSIS — Z87891 Personal history of nicotine dependence: Secondary | ICD-10-CM

## 2018-12-13 DIAGNOSIS — Z03818 Encounter for observation for suspected exposure to other biological agents ruled out: Secondary | ICD-10-CM | POA: Diagnosis not present

## 2018-12-13 DIAGNOSIS — Z79899 Other long term (current) drug therapy: Secondary | ICD-10-CM

## 2018-12-13 DIAGNOSIS — I739 Peripheral vascular disease, unspecified: Secondary | ICD-10-CM | POA: Diagnosis present

## 2018-12-13 DIAGNOSIS — K5731 Diverticulosis of large intestine without perforation or abscess with bleeding: Secondary | ICD-10-CM | POA: Diagnosis not present

## 2018-12-13 DIAGNOSIS — E785 Hyperlipidemia, unspecified: Secondary | ICD-10-CM | POA: Diagnosis present

## 2018-12-13 DIAGNOSIS — K649 Unspecified hemorrhoids: Secondary | ICD-10-CM | POA: Diagnosis present

## 2018-12-13 DIAGNOSIS — Z7901 Long term (current) use of anticoagulants: Secondary | ICD-10-CM

## 2018-12-13 DIAGNOSIS — I2510 Atherosclerotic heart disease of native coronary artery without angina pectoris: Secondary | ICD-10-CM | POA: Diagnosis present

## 2018-12-13 DIAGNOSIS — F0280 Dementia in other diseases classified elsewhere without behavioral disturbance: Secondary | ICD-10-CM | POA: Diagnosis present

## 2018-12-13 DIAGNOSIS — G309 Alzheimer's disease, unspecified: Secondary | ICD-10-CM | POA: Diagnosis present

## 2018-12-13 DIAGNOSIS — Z950 Presence of cardiac pacemaker: Secondary | ICD-10-CM

## 2018-12-13 DIAGNOSIS — D7589 Other specified diseases of blood and blood-forming organs: Secondary | ICD-10-CM | POA: Diagnosis present

## 2018-12-13 DIAGNOSIS — K432 Incisional hernia without obstruction or gangrene: Secondary | ICD-10-CM | POA: Diagnosis present

## 2018-12-13 DIAGNOSIS — I480 Paroxysmal atrial fibrillation: Secondary | ICD-10-CM | POA: Diagnosis present

## 2018-12-13 DIAGNOSIS — I712 Thoracic aortic aneurysm, without rupture: Secondary | ICD-10-CM | POA: Diagnosis present

## 2018-12-13 DIAGNOSIS — Z20828 Contact with and (suspected) exposure to other viral communicable diseases: Secondary | ICD-10-CM | POA: Diagnosis not present

## 2018-12-13 DIAGNOSIS — H3530 Unspecified macular degeneration: Secondary | ICD-10-CM | POA: Diagnosis present

## 2018-12-13 DIAGNOSIS — Z955 Presence of coronary angioplasty implant and graft: Secondary | ICD-10-CM

## 2018-12-13 LAB — CBC WITH DIFFERENTIAL/PLATELET
Abs Immature Granulocytes: 0.02 10*3/uL (ref 0.00–0.07)
Basophils Absolute: 0 10*3/uL (ref 0.0–0.1)
Basophils Relative: 0 %
Eosinophils Absolute: 0.2 10*3/uL (ref 0.0–0.5)
Eosinophils Relative: 3 %
HCT: 35.6 % — ABNORMAL LOW (ref 39.0–52.0)
Hemoglobin: 11.2 g/dL — ABNORMAL LOW (ref 13.0–17.0)
Immature Granulocytes: 0 %
Lymphocytes Relative: 21 %
Lymphs Abs: 1.3 10*3/uL (ref 0.7–4.0)
MCH: 32.6 pg (ref 26.0–34.0)
MCHC: 31.5 g/dL (ref 30.0–36.0)
MCV: 103.5 fL — ABNORMAL HIGH (ref 80.0–100.0)
Monocytes Absolute: 0.5 10*3/uL (ref 0.1–1.0)
Monocytes Relative: 9 %
Neutro Abs: 3.9 10*3/uL (ref 1.7–7.7)
Neutrophils Relative %: 67 %
Platelets: 166 10*3/uL (ref 150–400)
RBC: 3.44 MIL/uL — ABNORMAL LOW (ref 4.22–5.81)
RDW: 13.9 % (ref 11.5–15.5)
WBC: 5.9 10*3/uL (ref 4.0–10.5)
nRBC: 0 % (ref 0.0–0.2)

## 2018-12-13 LAB — CBC
HCT: 31.7 % — ABNORMAL LOW (ref 39.0–52.0)
Hemoglobin: 10.4 g/dL — ABNORMAL LOW (ref 13.0–17.0)
MCH: 33.5 pg (ref 26.0–34.0)
MCHC: 32.8 g/dL (ref 30.0–36.0)
MCV: 102.3 fL — ABNORMAL HIGH (ref 80.0–100.0)
Platelets: 153 10*3/uL (ref 150–400)
RBC: 3.1 MIL/uL — ABNORMAL LOW (ref 4.22–5.81)
RDW: 13.8 % (ref 11.5–15.5)
WBC: 5.2 10*3/uL (ref 4.0–10.5)
nRBC: 0 % (ref 0.0–0.2)

## 2018-12-13 LAB — COMPREHENSIVE METABOLIC PANEL
ALT: 16 U/L (ref 0–44)
AST: 19 U/L (ref 15–41)
Albumin: 3.4 g/dL — ABNORMAL LOW (ref 3.5–5.0)
Alkaline Phosphatase: 42 U/L (ref 38–126)
Anion gap: 8 (ref 5–15)
BUN: 21 mg/dL (ref 8–23)
CO2: 26 mmol/L (ref 22–32)
Calcium: 9.1 mg/dL (ref 8.9–10.3)
Chloride: 110 mmol/L (ref 98–111)
Creatinine, Ser: 1.03 mg/dL (ref 0.61–1.24)
GFR calc Af Amer: >60 mL/min (ref >60)
GFR calc non Af Amer: >60 mL/min (ref >60)
Glucose, Bld: 126 mg/dL — ABNORMAL HIGH (ref 70–99)
Potassium: 4.1 mmol/L (ref 3.5–5.1)
Sodium: 144 mmol/L (ref 135–145)
Total Bilirubin: 0.9 mg/dL (ref 0.3–1.2)
Total Protein: 6.2 g/dL — ABNORMAL LOW (ref 6.5–8.1)

## 2018-12-13 LAB — PROTIME-INR
INR: 1.4 — ABNORMAL HIGH (ref 0.8–1.2)
Prothrombin Time: 17.3 seconds — ABNORMAL HIGH (ref 11.4–15.2)

## 2018-12-13 LAB — TYPE AND SCREEN
ABO/RH(D): A POS
Antibody Screen: NEGATIVE

## 2018-12-13 LAB — POC OCCULT BLOOD, ED: Fecal Occult Bld: POSITIVE — AB

## 2018-12-13 LAB — SARS CORONAVIRUS 2 BY RT PCR (HOSPITAL ORDER, PERFORMED IN ~~LOC~~ HOSPITAL LAB): SARS Coronavirus 2: NEGATIVE

## 2018-12-13 MED ORDER — ACETAMINOPHEN 650 MG RE SUPP: 650.0000 mg | Freq: Four times a day (QID) | RECTAL | Status: DC | PRN

## 2018-12-13 MED ORDER — ONDANSETRON HCL 4 MG/2ML IJ SOLN: 4.0000 mg | Freq: Four times a day (QID) | INTRAMUSCULAR | Status: DC | PRN

## 2018-12-13 MED ORDER — METOPROLOL TARTRATE 25 MG PO TABS
25.0000 mg | ORAL_TABLET | Freq: Two times a day (BID) | ORAL | Status: DC
  Administered 2018-12-13 – 2018-12-16 (×6): 25 mg via ORAL
  Filled 2018-12-13 (×6): qty 1

## 2018-12-13 MED ORDER — LACTATED RINGERS IV SOLN
INTRAVENOUS | Status: AC
  Administered 2018-12-13: 12:00:00 via INTRAVENOUS

## 2018-12-13 MED ORDER — ACETAMINOPHEN 325 MG PO TABS: 650.0000 mg | ORAL_TABLET | Freq: Four times a day (QID) | ORAL | Status: DC | PRN

## 2018-12-13 MED ORDER — BUTENAFINE HCL 1 % EX CREA: 1.0000 "application " | TOPICAL_CREAM | Freq: Two times a day (BID) | CUTANEOUS | Status: DC

## 2018-12-13 MED ORDER — SODIUM CHLORIDE 0.9 % IV SOLN
INTRAVENOUS | Status: DC
  Administered 2018-12-13: 10:00:00 via INTRAVENOUS

## 2018-12-13 MED ORDER — ROSUVASTATIN CALCIUM 5 MG PO TABS
5.0000 mg | ORAL_TABLET | Freq: Every day | ORAL | Status: DC
  Administered 2018-12-13 – 2018-12-15 (×3): 5 mg via ORAL
  Filled 2018-12-13 (×3): qty 1

## 2018-12-13 MED ORDER — DONEPEZIL HCL 10 MG PO TABS
10.0000 mg | ORAL_TABLET | ORAL | Status: DC
  Administered 2018-12-14 – 2018-12-16 (×3): 10 mg via ORAL
  Filled 2018-12-13 (×4): qty 1

## 2018-12-13 MED ORDER — MEMANTINE HCL ER 28 MG PO CP24
28.0000 mg | ORAL_CAPSULE | Freq: Every day | ORAL | Status: DC
  Administered 2018-12-13 – 2018-12-15 (×3): 28 mg via ORAL
  Filled 2018-12-13 (×3): qty 1

## 2018-12-13 MED ORDER — FINASTERIDE 5 MG PO TABS
5.0000 mg | ORAL_TABLET | Freq: Every day | ORAL | Status: DC
  Administered 2018-12-14 – 2018-12-16 (×3): 5 mg via ORAL
  Filled 2018-12-13 (×3): qty 1

## 2018-12-13 MED ORDER — ISOSORBIDE MONONITRATE ER 30 MG PO TB24
30.0000 mg | ORAL_TABLET | Freq: Every day | ORAL | Status: DC
  Administered 2018-12-14 – 2018-12-16 (×3): 30 mg via ORAL
  Filled 2018-12-13 (×3): qty 1

## 2018-12-13 MED ORDER — ONDANSETRON HCL 4 MG PO TABS: 4.0000 mg | ORAL_TABLET | Freq: Four times a day (QID) | ORAL | Status: DC | PRN

## 2018-12-13 MED ORDER — TAMSULOSIN HCL 0.4 MG PO CAPS
0.8000 mg | ORAL_CAPSULE | Freq: Every day | ORAL | Status: DC
  Administered 2018-12-13 – 2018-12-15 (×3): 0.8 mg via ORAL
  Filled 2018-12-13 (×3): qty 2

## 2018-12-13 MED ORDER — CLOTRIMAZOLE 1 % EX CREA
TOPICAL_CREAM | Freq: Two times a day (BID) | CUTANEOUS | Status: DC
  Administered 2018-12-13 (×2): via TOPICAL
  Administered 2018-12-14: 1 via TOPICAL
  Administered 2018-12-14 – 2018-12-15 (×3): via TOPICAL
  Filled 2018-12-13: qty 15

## 2018-12-13 MED ORDER — LORATADINE 10 MG PO TABS
10.0000 mg | ORAL_TABLET | Freq: Every day | ORAL | Status: DC
  Administered 2018-12-14 – 2018-12-16 (×3): 10 mg via ORAL
  Filled 2018-12-13 (×3): qty 1

## 2018-12-13 NOTE — H&P (Signed)
History and Physical    Brad [name] VEH:[id number] DOB: 06/13/1927 DOA: 12/13/2018  PCP: [name], MD Consultants:  Radford Pax - cardiology; [facility] - CT surgery; [facility] - urology; Henrene Pastor - GI Patient coming from:  Home - lives alone with lots of family nearby; [facility] - [phone]  Chief Complaint: Rectal bleeding  HPI: Brad [name] is a 85 y.o. male with medical history significant of pacemaker placement; s/p AAA repair; PAF on Eliquis; macular degeneration; HTN; HLD; CAD s/p stent; and COPD presenting with rectal bleeding.  Friday, he went to the bathroom and found BRBPR.  His son called his PCP and made an appointment for tomorrow.  No issues yesterday.  This AM, he had a large BM with clots and so decided to come to the ER.  He felt a little weak this AM.  He was not short of breath.  No prior episodes in the past.  He does also have a rash on his abdomen, thought to be a yeast and given prescription cream.  He has 4 hernias, has chronic abdominal pain, unchanged (recommended not to have repair due to heart condition).  His bloody BM was at about 0530, but he still took this AM's dose of Eliquis at 0700.   ED Course:  Lower GI bleeding, on AC.  Bleeding Friday intermittently, worse this AM.  Also weak.  Hgb 11.2, appears to be baseline.  Grossly maroon stools on exam.  Severe diverticulosis on colonoscopy in 2009.  Has not called GI.  Review of Systems: As per HPI; otherwise review of systems reviewed and negative.   Ambulatory Status:  Ambulates with a cane  Past Medical History:  Diagnosis Date   CAD (coronary artery disease)    s/p stent to OM2 in past;  Last LHC 3/05: EF 60%, left RA occluded, right RA ok, prox to mid LAD 60%, oD1 75-80%, oD2 small 75%, pOM1 50%, OM2 stent ok with 50% before stent, mRCA 30%, dRCA 30%, PDA 70%.  Last dobutamine myoview 5/06: no scar or ischemia, EF 60%.   Chronic lower back pain    COPD (chronic obstructive pulmonary disease)  (HCC)    DJD (degenerative joint disease)    Enlarged prostate    Ethanolism (HCC)    GERD (gastroesophageal reflux disease)    Hernia, abdominal    "he has ~ 4; wears binder" (11/20/2015)   HLD (hyperlipidemia)    HTN (hypertension)    Last echo 1/07:  Normal LVF, LV thickness upper limits of normal, mild aortic root dilatation, mild to mod MAC, mild LAE, borderline RVH, mild RAE.   Macular degeneration    Pacemaker    DR Radford Pax; a. s/p MDT dual chamber PPM with His Bundle pacing   PAD (peripheral artery disease) (HCC)    Paroxysmal atrial fibrillation ([facility])    Psoriasis    S/P AAA repair    Dr. Roxan Hockey   Scoliosis    Tobacco abuse    Unilateral congenital absence of kidney     Past Surgical History:  Procedure Laterality Date   APPENDECTOMY     CARPAL TUNNEL RELEASE Right 12/26/2014   Procedure: RIGHT ENDOSCOPIC CARPAL TUNNEL SYNDROME RELEASE;  Surgeon: Milly Jakob, MD;  Location: [facility];  Service: Orthopedics;  Laterality: Right;   CATARACT EXTRACTION, BILATERAL Bilateral    CORONARY ANGIOPLASTY WITH STENT PLACEMENT     s/p stent to OM2 in past;    EP IMPLANTABLE DEVICE N/A 11/20/2015   Procedure: Pacemaker  Implant;  Surgeon: Evans Lance, MD;  Location: [facility];  Service: Cardiovascular;  Laterality: N/A;   HERNIA REPAIR     INSERT / REPLACE / REMOVE PACEMAKER  11/20/2015   RESECTION AND GRAFTING OF INFARENAL  ABDOMINAL  AORTIC ANEURYSM  05/03/2002    Social History   Socioeconomic History   Marital status: Widowed    Spouse name: Not on file   Number of children: 3   Years of education: Not on file   Highest education level: Not on file  Occupational History   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: Not on file   Food insecurity    Worry: Not on file    Inability: Not on file   Transportation needs    Medical: Not on file    Non-medical: Not on file    Tobacco Use   Smoking status: Former Smoker    Packs/day: 2.50    Years: 52.00    Pack years: 130.00    Types: Cigarettes    Quit date: 05/06/1996    Years since quitting: 22.6   Smokeless tobacco: Never Used  Substance and Sexual Activity   Alcohol use: Yes    Comment: 11/20/2015 "used to drink heavily; quit 05/06/1996"   Drug use: No   Sexual activity: Never  Lifestyle   Physical activity    Days per week: Not on file    Minutes per session: Not on file   Stress: Not on file  Relationships   Social connections    Talks on phone: Not on file    Gets together: Not on file    Attends religious service: Not on file    Active member of club or organization: Not on file    Attends meetings of clubs or organizations: Not on file    Relationship status: Not on file   Intimate partner violence    Fear of current or ex partner: Not on file    Emotionally abused: Not on file    Physically abused: Not on file    Forced sexual activity: Not on file  Other Topics Concern   Not on file  Social History Narrative   Not on file    Allergies  Allergen Reactions   Aspirin Shortness Of Breath and Swelling   Atorvastatin Other (See Comments)    Leg pain, feet pain   Cephalexin Other (See Comments)    unknown    Family History  Problem Relation Age of Onset   Cancer Mother        uterine   Stroke Mother        hemi plagic   Cancer Sister    COPD Sister    Early death Brother    Cancer Brother        Lip   Stroke Father    AAA (abdominal aortic aneurysm) Son    Arthritis Son        knee   COPD Brother    Cancer Son        testicular    Hypertension Son    Early death Sister     Prior to Admission medications   Medication Sig Start Date End Date Taking? Authorizing Provider  acetaminophen (TYLENOL ARTHRITIS PAIN) 650 MG CR tablet Take 650 mg by mouth 2 (two) times a day.    Yes [provider]  b complex vitamins tablet Take 1  tablet by mouth  daily.   Yes [provider]  Butenafine HCl 1 % cream Apply twice daily to affected areas Patient taking differently: Apply 1 application topically 2 (two) times daily. Apply twice daily to affected areas 11/26/18  Yes Stacks, Cletus Gash, MD  celecoxib (CELEBREX) 400 MG capsule Take 1 capsule (400 mg total) by mouth daily. FOR FOOT, MUSCLE, JOINT PAIN AND SWELLING 12/04/18  Yes Stacks, Cletus Gash, MD  donepezil (ARICEPT) 10 MG tablet TAKE 1 TABLET BY MOUTH EVERY DAY Patient taking differently: Take 10 mg by mouth every morning. TAKE 1 TABLET BY MOUTH EVERY DAY 10/27/18  Yes Cameron Sprang, MD  ELIQUIS 5 MG TABS tablet TAKE 1 TABLET BY MOUTH TWICE A DAY Patient taking differently: Take 5 mg by mouth 2 (two) times daily.  10/26/18  Yes Stacks, Cletus Gash, MD  fexofenadine (ALLEGRA) 180 MG tablet TAKE 1 TABLET (180 MG TOTAL) BY MOUTH DAILY. FOR ALLERGY SYMPTOMS 09/04/18  Yes Stacks, Cletus Gash, MD  finasteride (PROSCAR) 5 MG tablet TAKE 1 TABLET BY MOUTH EVERY DAY Patient taking differently: Take 5 mg by mouth daily.  09/21/18  Yes [name], MD  fish oil-omega-3 fatty acids 1000 MG capsule Take 1 g by mouth at bedtime.    Yes [provider]  fluticasone (FLONASE) 50 MCG/ACT nasal spray SPRAY 2 SPRAYS INTO EACH NOSTRIL EVERY DAY Patient taking differently: Place 1 spray into both nostrils daily as needed for allergies.  11/10/17  Yes Stacks, Cletus Gash, MD  furosemide (LASIX) 20 MG tablet TAKE 1 TABLET BY MOUTH EVERY DAY IN THE MORNING Patient taking differently: Take 20 mg by mouth daily. TAKE 1 TABLET BY MOUTH EVERY DAY IN THE MORNING 11/27/18  Yes Stacks, Cletus Gash, MD  isosorbide mononitrate (IMDUR) 30 MG 24 hr tablet TAKE 1 TABLET BY MOUTH EVERY DAY Patient taking differently: Take 30 mg by mouth daily.  10/02/18  Yes Stacks, Cletus Gash, MD  KLOR-CON M20 20 MEQ tablet TAKE 1 TABLET BY MOUTH EVERY DAY Patient taking differently: Take 20 mEq by mouth daily.  11/30/18  Yes [name], MD   memantine (NAMENDA XR) 28 MG CP24 24 hr capsule Take 1 capsule (28 mg total) by mouth daily. Patient taking differently: Take 28 mg by mouth at bedtime.  07/20/18  Yes [name], MD  metoprolol tartrate (LOPRESSOR) 25 MG tablet Take 1 tablet (25 mg total) by mouth 2 (two) times daily. 04/21/18  Yes StacksCletus Gash, MD  Multiple Vitamin (MULTIVITAMIN WITH MINERALS) TABS tablet Take 1 tablet by mouth every evening. PRESER VISION    Yes [provider]  rosuvastatin (CRESTOR) 5 MG tablet Take 1 tablet (5 mg total) by mouth daily. Patient taking differently: Take 5 mg by mouth at bedtime.  04/21/18  Yes [name], MD  tamsulosin (FLOMAX) 0.4 MG CAPS capsule Take 2 capsules (0.8 mg total) by mouth at bedtime. 04/21/18  Yes Stacks, Cletus Gash, MD  TURMERIC CURCUMIN PO Take 1 tablet by mouth at bedtime.    Yes [provider]    Physical Exam: Vitals:   12/13/18 1000 12/13/18 1030 12/13/18 1100 12/13/18 1144  BP: 125/73 120/69 121/70 140/72  Pulse: 61 (!) 59 60 63  Resp: 17 17 (!) 25 (!) 23  Temp:    97.6 F (36.4 C)  TempSrc:    Oral  SpO2: 97% 99% 98% 99%  Weight:    82.4 kg  Height:    _0  (1.676 m)      General:  Appears calm and comfortable and is NAD  Eyes:  PERRL, EOMI, normal lids, iris  ENT:  grossly normal hearing, lips & tongue, mmm; artificial dentition  Neck:  no LAD, masses or thyromegaly  Cardiovascular:  RRR, no m/r/g. No LE edema.   Respiratory:   CTA bilaterally with no wheezes/rales/rhonchi.  Normal respiratory effort.  Abdomen:  soft, NT, ND, NABS; umbilical/ventral hernias noted  Skin:  Scattered maculopapular rash on abdomen and chest which his son says is improved with antifungal cream  Musculoskeletal:  grossly normal tone BUE/BLE, good ROM, no bony abnormality  Psychiatric:  grossly normal mood and affect, speech fluent and appropriate, AOx2  Neurologic:  CN 2-12 grossly intact, moves all extremities in coordinated fashion,  sensation intact    Radiological Exams on Admission: No results found.  EKG: Independently reviewed.  NSR with rate 62; RBBB, LAFB; LVH; no evidence of acute ischemia; NSCSLT   Labs on Admission: I have personally reviewed the available labs and imaging studies at the time of the admission.  Pertinent labs:   Glucose 126 Albumin 3.4 WBC 5.9 Hgb 11.2; 11.5 on 5/20 INR 1.4 Heme positive COVID negative  Assessment/Plan Principal Problem:   Lower GI bleeding Active Problems:   Essential hypertension   Hyperlipemia   Atrial fibrillation (HCC)   Dementia due to Alzheimer's disease ([place])   Lower GI bleeding -Differential to include diverticulosis (most likely given prior h/o significant diverticular disease on C-scope), bleeding hemorrhoids,and AVM (although the patient denies massive bleeding and his vitals are not consistent with massive acute blood loss).  -Bleeding was likely exacerbated by Eliquis; will hold.  His last dose was this AM and so it may be overnight or tomorrow before his risk is diminished. -While he does have Celebrex as one of his listed medications, this does not appear to be consistent with an UGI bleed at this time (but will hold) -Due to the nature of diverticular bleeds, the patient will likely just need supportive treatment.  -He is afebrile at this time without tachycardia, and no leukocytosis; will not give antibiotics at this time.  -Observation for now -CBC tonight and again in AM; transfuse for Hgb <7 -Continue to monitor for recurrent bleeding  -If patient has recurrent bleeding, he may benefit from tagged RBC scan. -If he has continued bleeding, he may also need GI consultation. -Clear liquids for now.  Afib, on Eliquis -He is rate controlled with pacemaker and on Lopressor (continued) -Hold Eliquis due to bleeding -While he is at increased stroke risk if he does not resume this medication, his son reports recent falls and it may be time to  consider stopping it; will defer to day team and/or PCP  Dementia -Continue Aricept and Namenda -The son reports prior sundowning, but the patient responded well to having a chair at the nursing station -Will order a telesitter for now -The patient's son is willing to come in and stay with him if requested  HTN -Continue Lopressor  HLD -Continue Crestor    Note: This patient has been tested and is negative for the novel coronavirus COVID-19.  DVT prophylaxis:  SCDs Code Status: DNR - confirmed with patient/family Family Communication: Son was present throughout evaluation Disposition Plan:  Home once clinically improved Consults called: None  Admission status: It is my clinical opinion that referral for OBSERVATION is reasonable and necessary in this patient based on the above information provided. The aforementioned taken together are felt to place the patient at high risk for further clinical deterioration. However it is anticipated that  the patient may be medically stable for discharge from the hospital within 24 to 48 hours.    Karmen Bongo MD [facility]   How to contact the [facility] Attending or Consulting provider [place] or covering provider during after hours [place], for this patient?  1. Check the care team in [facility] and look for a) attending/consulting TR[facility] provider listed and b) the [facility] team listed 2. Log into [email or web address] and use [place]'s universal password to access. If you do not have the password, please contact the hospital operator. 3. Locate the [facility] provider you are looking for under [facility] and page to a number that you can be directly reached. 4. If you still have difficulty reaching the provider, please page the [facility] (Director on Call) for the Hospitalists listed on amion for assistance.   12/13/2018, 1:56 PM

## 2018-12-13 NOTE — ED Provider Notes (Signed)
[facility] EMERGENCY DEPARTMENT Provider Note   CSN: [id number] Arrival date & time: 12/13/18  0830     History   Chief Complaint No chief complaint on file.   HPI Brad [name] is a 85 y.o. male.     Patient with h/o CAD, aortic aneurysm s/p repair, s/p pacemaker placement 2/2 sinus node dysfunction, chronic anticoagulation on Eliquis, h/o severe diverticulosis on colonoscopy in 2009, dementia -- presents with c/o rectal bleeding.  Patient had a couple episodes of red blood from his rectum 2 days ago.  No blood noted yesterday but bleeding resumed this morning.  Patient has not had any abdominal pain, chest pain, shortness of breath but has been generally weak today.  Son has pictures on his phone of obviously bloody stool.  Patient has never had a GI bleed in the past.  He has had some blood in his urine over the past month and is currently being treated for a fungal rash on his abdomen.  The onset of this condition was acute. The course is constant. Aggravating factors: none. Alleviating factors: none.       Past Medical History:  Diagnosis Date  . Ascending aortic aneurysm (HCC)    Dr. Roxan Hockey  . Atrial fibrillation ([place]) 11/21/2015  . CAD (coronary artery disease)    s/p stent to OM2 in past;  Last LHC 3/05: EF 60%, left RA occluded, right RA ok, prox to mid LAD 60%, oD1 75-80%, oD2 small 75%, pOM1 50%, OM2 stent ok with 50% before stent, mRCA 30%, dRCA 30%, PDA 70%.  Last dobutamine myoview 5/06: no scar or ischemia, EF 60%.  . Chronic lower back pain   . COPD (chronic obstructive pulmonary disease) ([place])   . DJD (degenerative joint disease)   . Enlarged prostate   . 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 GERD (gastroesophageal reflux disease)   . Hernia, abdominal    "he has ~ 4; wears binder" (11/20/2015)  . HLD (hyperlipidemia)   . HTN (hypertension)    Last echo 1/07:  Normal LVF, LV thickness upper limits of normal, mild aortic root dilatation, mild to mod MAC,  mild LAE, borderline RVH, mild RAE.  . Macular degeneration   . Pacemaker    DR Radford Pax  . PAD (peripheral artery disease) ([place])   . Paroxysmal atrial fibrillation (HCC)   . Psoriasis   . S/P AAA repair   . Scoliosis   . Sinus node dysfunction (HCC)    a. s/p MDT dual chamber PPM with His Bundle pacing  . Tobacco abuse   . Unilateral congenital absence of kidney     Patient Active Problem List   Diagnosis Date Noted  . Dementia due to Alzheimer's disease ([place]) 10/02/2018  . Blood in urine 12/04/2016  . Mild dementia ([place]) 07/03/2016  . Atrial fibrillation ([place]) 11/21/2015  . Sinus node dysfunction ([place]) 11/20/2015  . BPH (benign prostatic hyperplasia) 07/07/2015  . Hyperlipemia 07/07/2015  . Ventral hernia 05/31/2013  . Carpal tunnel syndrome, bilateral 05/07/2011  . 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 COPD (chronic obstructive pulmonary disease) ([place])   . ADENOMATOUS COLONIC POLYP 04/18/2008  . Essential hypertension 04/18/2008  . Coronary atherosclerosis 04/18/2008  . AAA 04/18/2008  . PVD 04/18/2008  . GERD 04/18/2008  . DIVERTICULOSIS, COLON 04/18/2008    Past Surgical History:  Procedure Laterality Date  . APPENDECTOMY    . CARPAL TUNNEL RELEASE Right 12/26/2014   Procedure: RIGHT ENDOSCOPIC CARPAL TUNNEL SYNDROME RELEASE;  Surgeon: Milly Jakob,  MD;  Location: [facility];  Service: Orthopedics;  Laterality: Right;  . CATARACT EXTRACTION, BILATERAL Bilateral   . CORONARY ANGIOPLASTY WITH STENT PLACEMENT     s/p stent to OM2 in past;   . EP IMPLANTABLE DEVICE N/A 11/20/2015   Procedure: Pacemaker Implant;  Surgeon: Evans Lance, MD;  Location: [facility];  Service: Cardiovascular;  Laterality: N/A;  . HERNIA REPAIR    . INSERT / REPLACE / REMOVE PACEMAKER  11/20/2015  . RESECTION AND GRAFTING OF INFARENAL  ABDOMINAL  AORTIC ANEURYSM  05/03/2002        Home Medications    Prior to Admission medications   Medication Sig Start Date End Date Taking?  Authorizing Provider  acetaminophen (TYLENOL ARTHRITIS PAIN) 650 MG CR tablet Take 650 mg by mouth 2 (two) times a day.     [provider]  b complex vitamins tablet Take 1 tablet by mouth daily.    [provider]  Butenafine HCl 1 % cream Apply twice daily to affected areas 11/26/18   [name], MD  celecoxib (CELEBREX) 400 MG capsule Take 1 capsule (400 mg total) by mouth daily. FOR FOOT, MUSCLE, JOINT PAIN AND SWELLING 12/04/18   [name], MD  donepezil (ARICEPT) 10 MG tablet TAKE 1 TABLET BY MOUTH EVERY DAY 10/27/18   Cameron Sprang, MD  ELIQUIS 5 MG TABS tablet TAKE 1 TABLET BY MOUTH TWICE A DAY 10/26/18   [name], MD  fexofenadine (ALLEGRA) 180 MG tablet TAKE 1 TABLET (180 MG TOTAL) BY MOUTH DAILY. FOR ALLERGY SYMPTOMS 09/04/18   [name], MD  finasteride (PROSCAR) 5 MG tablet TAKE 1 TABLET BY MOUTH EVERY DAY Patient taking differently: Take 5 mg by mouth daily.  09/21/18   [name], MD  fish oil-omega-3 fatty acids 1000 MG capsule Take 1 g by mouth at bedtime.     [provider]  fluticasone (FLONASE) 50 MCG/ACT nasal spray SPRAY 2 SPRAYS INTO EACH NOSTRIL EVERY DAY Patient taking differently: Place 1 spray into both nostrils as needed for allergies.  11/10/17   [name], MD  furosemide (LASIX) 20 MG tablet TAKE 1 TABLET BY MOUTH EVERY DAY IN THE MORNING 11/27/18   [name], MD  isosorbide mononitrate (IMDUR) 30 MG 24 hr tablet TAKE 1 TABLET BY MOUTH EVERY DAY Patient taking differently: Take 30 mg by mouth daily.  10/02/18   [name], MD  KLOR-CON M20 20 MEQ tablet TAKE 1 TABLET BY MOUTH EVERY DAY 11/30/18   [name], MD  memantine (NAMENDA XR) 28 MG CP24 24 hr capsule Take 1 capsule (28 mg total) by mouth daily. Patient taking differently: Take 28 mg by mouth at bedtime.  07/20/18   [name], MD  metoprolol tartrate (LOPRESSOR) 25 MG tablet Take 1 tablet (25 mg total) by mouth 2 (two) times daily. 04/21/18   [name], MD  Multiple Vitamin (MULTIVITAMIN WITH MINERALS) TABS tablet Take 1 tablet by mouth daily. [place]    [provider]  rosuvastatin (CRESTOR) 5 MG tablet Take 1 tablet (5 mg total) by mouth daily. Patient taking differently: Take 5 mg by mouth at bedtime.  04/21/18   [name], MD  tamsulosin (FLOMAX) 0.4 MG CAPS capsule Take 2 capsules (0.8 mg total) by mouth at bedtime. 04/21/18   [name], MD  TURMERIC CURCUMIN PO Take 1 tablet by mouth at bedtime.     [provider]    Family History  Family History  Problem Relation Age of Onset  . Cancer Mother        uterine  . Stroke Mother        hemi plagic  . Cancer Sister   . COPD Sister   . Early death Brother   . Cancer Brother        Lip  . Stroke Father   . AAA (abdominal aortic aneurysm) Son   . Arthritis Son        knee  . COPD Brother   . Cancer Son        testicular   . Hypertension Son   . Early death Sister     Social History Social History   Tobacco Use  . Smoking status: Former Smoker    Packs/day: 2.50    Years: 52.00    Pack years: 130.00    Types: Cigarettes    Quit date: 05/06/1996    Years since quitting: 22.6  . Smokeless tobacco: Never Used  Substance Use Topics  . Alcohol use: Yes    Comment: 11/20/2015 "used to drink heavily; quit 05/06/1996"  . Drug use: No     Allergies   Aspirin, Atorvastatin, and Cephalexin   Review of Systems Review of Systems  Constitutional: Negative for fever.  HENT: Negative for rhinorrhea and sore throat.   Eyes: Negative for redness.  Respiratory: Negative for cough.   Cardiovascular: Negative for chest pain.  Gastrointestinal: Positive for blood in stool. Negative for abdominal pain, constipation, diarrhea, nausea and vomiting.  Genitourinary: Negative for dysuria.  Musculoskeletal: Negative for myalgias.  Skin: Negative for rash.  Neurological: Positive for weakness. Negative for headaches.     Physical Exam  Updated Vital Signs BP 128/79   Pulse 84   Temp 97.7 F (36.5 C) (Oral)   Resp (!) 22   Ht _0  (1.676 m)   Wt 83.1 kg   SpO2 95%   BMI 29.57 kg/m   Physical Exam Vitals signs and nursing note reviewed.  Constitutional:      Appearance: He is well-developed.  HENT:     Head: Normocephalic and atraumatic.  Eyes:     General:        Right eye: No discharge.        Left eye: No discharge.     Comments: Slightly pale conjunctiva noted bilaterally.  Neck:     Musculoskeletal: Normal range of motion and neck supple.  Cardiovascular:     Rate and Rhythm: Normal rate and regular rhythm.     Heart sounds: Normal heart sounds.  Pulmonary:     Effort: Pulmonary effort is normal.     Breath sounds: Normal breath sounds.  Abdominal:     Palpations: Abdomen is soft.     Tenderness: There is no abdominal tenderness.     Comments: Patient with several small easily reducible abdominal hernias.  Genitourinary:    Prostate: Enlarged.     Rectum: Guaiac result positive. No external hemorrhoid or internal hemorrhoid.     Comments: Maroon stool on glove. No actively bleeding hemorrhoids.  Skin:    General: Skin is warm and dry.  Neurological:     Mental Status: He is alert.      ED Treatments / Results  Labs (all labs ordered are listed, but only abnormal results are displayed) Labs Reviewed  COMPREHENSIVE METABOLIC PANEL - Abnormal; Notable for the following components:      Result Value   Glucose, Bld 126 (*)  Total Protein 6.2 (*)    Albumin 3.4 (*)    All other components within normal limits  CBC WITH DIFFERENTIAL/PLATELET - Abnormal; Notable for the following components:   RBC 3.44 (*)    Hemoglobin 11.2 (*)    HCT 35.6 (*)    MCV 103.5 (*)    All other components within normal limits  PROTIME-INR - Abnormal; Notable for the following components:   Prothrombin Time 17.3 (*)    INR 1.4 (*)    All other components within normal limits  POC OCCULT BLOOD, ED -  Abnormal; Notable for the following components:   Fecal Occult Bld POSITIVE (*)    All other components within normal limits  SARS CORONAVIRUS 2 (HOSPITAL ORDER, [facility])  TYPE AND SCREEN  ABO/RH    EKG EKG Interpretation  Date/Time:  Sunday December 13 2018 08:48:11 EDT Ventricular Rate:  62 PR Interval:    QRS Duration: 126 QT Interval:  423 QTC Calculation: 430 R Axis:   -62 Text Interpretation:  Sinus rhythm RBBB and LAFB Left ventricular hypertrophy Baseline wander No significant change since last tracing Confirmed by Lajean Saver [phone]) on 12/13/2018 8:59:36 AM   Radiology No results found.  Procedures Procedures (including critical care time)  Medications Ordered in ED Medications  0.9 %  sodium chloride infusion ( Intravenous New Bag/Given 12/13/18 0958)     Initial Impression / Assessment and Plan / ED Course  I have reviewed the triage vital signs and the nursing notes.  Pertinent labs & imaging results that were available during my care of the patient were reviewed by me and considered in my medical decision making (see chart for details).        Patient seen and examined.  Rectal exam performed with RN chaperone.  Stool was maroon in color and grossly positive.  Patient will need admission to the hospital for continued hemoglobin monitoring.  Patient is willing to have a blood transfusion if we deem it necessary.  Currently patient is hemodynamically stable.  Vital signs reviewed and are as follows: BP 111/61 (BP Location: Right Arm)   Pulse 62   Temp 97.7 F (36.5 C) (Oral)   Resp 16   Ht _0  (1.676 m)   Wt 83.1 kg   SpO2 97%   BMI 29.57 kg/m   9:33 AM Patient's hemoglobin is 11.2 today which appears close to his baseline.  10:25 AM I spoke with Dr. Lorin Mercy of [facility] who will see.    Final Clinical Impressions(s) / ED Diagnoses   Final diagnoses:  Acute lower GI bleeding  Chronic anticoagulation   Stable lower GI  bleed.   ED Discharge Orders    None       Carlisle Cater, Hershal Coria 12/13/18 1025    Lajean Saver, MD 12/13/18 [phone]

## 2018-12-13 NOTE — ED Triage Notes (Signed)
Patient arrives with son c/o rectal bleeding onset of Friday morning. Patient denies any episodes yesterday but another epsisode of bleeding this am. Son states patient seems a little weaker today. Patient has Medtronic pacemaker. Patient had eliquis and other am meds PTA.

## 2018-12-13 NOTE — ED Notes (Signed)
ED TO INPATIENT HANDOFF REPORT  ED Nurse Name and Phone #: Joellen Jersey [phone]  S Name/Age/Gender Brad [name] 85 y.o. male Room/Bed: 019C/019C  Code Status   Code Status: Prior  Home/SNF/Other Home Patient oriented to: self, place, time and situation Is this baseline? Yes   Triage Complete: Triage complete  Chief Complaint Rectal Bleeding  Triage Note Patient arrives with son c/o rectal bleeding onset of Friday morning. Patient denies any episodes yesterday but another epsisode of bleeding this am. Son states patient seems a little weaker today. Patient has Medtronic pacemaker. Patient had eliquis and other am meds PTA.    Allergies Allergies  Allergen Reactions  . Aspirin Shortness Of Breath and Swelling  . Atorvastatin Other (See Comments)    Leg pain, feet pain  . Cephalexin Other (See Comments)    unknown    Level of Care/Admitting Diagnosis ED Disposition    ED Disposition Condition Comment   Admit  Hospital Area: [facility] [[id number]]  Level of Care: Med-Surg [16]  I expect the patient will be discharged within 24 hours: Yes  LOW acuity---Tx typically complete <24 hrs---ACUTE conditions typically can be evaluated <24 hours---LABS likely to return to acceptable levels <24 hours---IS near functional baseline---EXPECTED to return to current living arrangement---NOT newly hypoxic: Meets criteria for 5C-Observation unit  Covid Evaluation: Asymptomatic Screening Protocol (No Symptoms)  Diagnosis: Lower GI bleeding [193790]  Admitting Physician: Karmen Bongo [[id number]]  Attending Physician: Karmen Bongo [[id number]]  PT Class (Do Not Modify): Observation [104]  PT Acc Code (Do Not Modify): Observation [10022]       B Medical/Surgery History Past Medical History:  Diagnosis Date  . CAD (coronary artery disease)    s/p stent to OM2 in past;  Last LHC 3/05: EF 60%, left RA occluded, right RA ok, prox to mid LAD 60%, oD1 75-80%, oD2 small 75%, pOM1 50%,  OM2 stent ok with 50% before stent, mRCA 30%, dRCA 30%, PDA 70%.  Last dobutamine myoview 5/06: no scar or ischemia, EF 60%.  . Chronic lower back pain   . COPD (chronic obstructive pulmonary disease) ([place])   . DJD (degenerative joint disease)   . Enlarged prostate   . 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 GERD (gastroesophageal reflux disease)   . Hernia, abdominal    "he has ~ 4; wears binder" (11/20/2015)  . HLD (hyperlipidemia)   . HTN (hypertension)    Last echo 1/07:  Normal LVF, LV thickness upper limits of normal, mild aortic root dilatation, mild to mod MAC, mild LAE, borderline RVH, mild RAE.  . Macular degeneration   . Pacemaker    DR Radford Pax; a. s/p MDT dual chamber PPM with His Bundle pacing  . PAD (peripheral artery disease) ([place])   . Paroxysmal atrial fibrillation (HCC)   . Psoriasis   . S/P AAA repair    Dr. Roxan Hockey  . Scoliosis   . Tobacco abuse   . Unilateral congenital absence of kidney    Past Surgical History:  Procedure Laterality Date  . APPENDECTOMY    . CARPAL TUNNEL RELEASE Right 12/26/2014   Procedure: RIGHT ENDOSCOPIC CARPAL TUNNEL SYNDROME RELEASE;  Surgeon: Milly Jakob, MD;  Location: [facility];  Service: Orthopedics;  Laterality: Right;  . CATARACT EXTRACTION, BILATERAL Bilateral   . CORONARY ANGIOPLASTY WITH STENT PLACEMENT     s/p stent to OM2 in past;   . EP IMPLANTABLE DEVICE N/A 11/20/2015   Procedure: Pacemaker Implant;  Surgeon: Evans Lance, MD;  Location: [facility];  Service: Cardiovascular;  Laterality: N/A;  . HERNIA REPAIR    . INSERT / REPLACE / REMOVE PACEMAKER  11/20/2015  . RESECTION AND GRAFTING OF INFARENAL  ABDOMINAL  AORTIC ANEURYSM  05/03/2002     A IV Location/Drains/Wounds Patient Lines/Drains/Airways Status   Active Line/Drains/Airways    Name:   Placement date:   Placement time:   Site:   Days:   Peripheral IV 12/13/18 Right;Posterior Arm   12/13/18    0901    Arm   less than 1   Closed System Drain 1 Right  Other (Comment) Autotranfusion system (Autovac) 7 Fr.   12/26/14    1134    Other (Comment)   1448   Incision (Closed) 12/26/14 Hand Right   12/26/14    1143     1448   Incision (Closed) 11/20/15 Chest Left   11/20/15    1630     1119          Intake/Output Last 24 hours No intake or output data in the 24 hours ending 12/13/18 1054  Labs/Imaging Results for orders placed or performed during the hospital encounter of 12/13/18 (from the past 48 hour(s))  Comprehensive metabolic panel     Status: Abnormal   Collection Time: 12/13/18  9:03 AM  Result Value Ref Range   Sodium 144 135 - 145 mmol/L   Potassium 4.1 3.5 - 5.1 mmol/L   Chloride 110 98 - 111 mmol/L   CO2 26 22 - 32 mmol/L   Glucose, Bld 126 (H) 70 - 99 mg/dL   BUN 21 8 - 23 mg/dL   Creatinine, Ser 1.03 0.61 - 1.24 mg/dL   Calcium 9.1 8.9 - 10.3 mg/dL   Total Protein 6.2 (L) 6.5 - 8.1 g/dL   Albumin 3.4 (L) 3.5 - 5.0 g/dL   AST 19 15 - 41 U/L   ALT 16 0 - 44 U/L   Alkaline Phosphatase 42 38 - 126 U/L   Total Bilirubin 0.9 0.3 - 1.2 mg/dL   GFR calc non Af Amer >60 >60 mL/min   GFR calc Af Amer >60 >60 mL/min   Anion gap 8 5 - 15    Comment: Performed at [facility], [address] [place], [place] [address]  CBC WITH DIFFERENTIAL     Status: Abnormal   Collection Time: 12/13/18  9:03 AM  Result Value Ref Range   WBC 5.9 4.0 - 10.5 K/uL   RBC 3.44 (L) 4.22 - 5.81 MIL/uL   Hemoglobin 11.2 (L) 13.0 - 17.0 g/dL   HCT 35.6 (L) 39.0 - 52.0 %   MCV 103.5 (H) 80.0 - 100.0 fL   MCH 32.6 26.0 - 34.0 pg   MCHC 31.5 30.0 - 36.0 g/dL   RDW 13.9 11.5 - 15.5 %   Platelets 166 150 - 400 K/uL   nRBC 0.0 0.0 - 0.2 %   Neutrophils Relative % 67 %   Neutro Abs 3.9 1.7 - 7.7 K/uL   Lymphocytes Relative 21 %   Lymphs Abs 1.3 0.7 - 4.0 K/uL   Monocytes Relative 9 %   Monocytes Absolute 0.5 0.1 - 1.0 K/uL   Eosinophils Relative 3 %   Eosinophils Absolute 0.2 0.0 - 0.5 K/uL   Basophils Relative 0 %   Basophils Absolute 0.0  0.0 - 0.1 K/uL   Immature Granulocytes 0 %   Abs Immature Granulocytes 0.02 0.00 - 0.07 K/uL    Comment: Performed  at [facility], [place] [address] [place], [place] [address]  Protime-INR     Status: Abnormal   Collection Time: 12/13/18  9:03 AM  Result Value Ref Range   Prothrombin Time 17.3 (H) 11.4 - 15.2 seconds   INR 1.4 (H) 0.8 - 1.2    Comment: (NOTE) INR goal varies based on device and disease states. Performed at [facility], [place] [address], [place], [place] [address]   Type and screen [place]     Status: None   Collection Time: 12/13/18  9:04 AM  Result Value Ref Range   ABO/RH(D) A POS    Antibody Screen NEG    Sample Expiration      12/16/2018,2359 Performed at [facility], [place] [address] [place], [place] [address]   SARS Coronavirus 2 (CEPHEID - Performed in [facility] hospital lab), Hosp Order     Status: None   Collection Time: 12/13/18  9:10 AM   Specimen: Nasopharyngeal Swab  Result Value Ref Range   SARS Coronavirus 2 NEGATIVE NEGATIVE    Comment: (NOTE) If result is NEGATIVE SARS-CoV-2 target nucleic acids are NOT DETECTED. The SARS-CoV-2 RNA is generally detectable in upper and lower  respiratory specimens during the acute phase of infection. The lowest  concentration of SARS-CoV-2 viral copies this assay can detect is 250  copies / mL. A negative result does not preclude SARS-CoV-2 infection  and should not be used as the sole basis for treatment or other  patient management decisions.  A negative result may occur with  improper specimen collection / handling, submission of specimen other  than nasopharyngeal swab, presence of viral mutation(s) within the  areas targeted by this assay, and inadequate number of viral copies  (<250 copies / mL). A negative result must be combined with clinical  observations, patient history, and epidemiological information. If result is POSITIVE SARS-CoV-2 target nucleic acids  are DETECTED. The SARS-CoV-2 RNA is generally detectable in upper and lower  respiratory specimens dur ing the acute phase of infection.  Positive  results are indicative of active infection with SARS-CoV-2.  Clinical  correlation with patient history and other diagnostic information is  necessary to determine patient infection status.  Positive results do  not rule out bacterial infection or co-infection with other viruses. If result is PRESUMPTIVE POSTIVE SARS-CoV-2 nucleic acids MAY BE PRESENT.   A presumptive positive result was obtained on the submitted specimen  and confirmed on repeat testing.  While 2019 novel coronavirus  (SARS-CoV-2) nucleic acids may be present in the submitted sample  additional confirmatory testing may be necessary for epidemiological  and / or clinical management purposes  to differentiate between  SARS-CoV-2 and other Sarbecovirus currently known to infect humans.  If clinically indicated additional testing with an alternate test  methodology [phone]) is advised. The SARS-CoV-2 RNA is generally  detectable in upper and lower respiratory sp ecimens during the acute  phase of infection. The expected result is Negative. Fact Sheet for Patients:  StrictlyIdeas.no Fact Sheet for Healthcare Providers: BankingDealers.co.za This test is not yet approved or cleared by the [place] FDA and has been authorized for detection and/or diagnosis of SARS-CoV-2 by FDA under an Emergency Use Authorization (EUA).  This EUA will remain in effect (meaning this test can be used) for the duration of the COVID-19 declaration under Section 564(b)(1) of the Act, 21 U.S.C. section 360bbb-3(b)(1), unless the authorization is terminated or revoked sooner. Performed at  [facility], [place] [address], [place], [place] [address]   POC occult blood, ED Provider will collect     Status: Abnormal   Collection Time: 12/13/18  9:15  AM  Result Value Ref Range   Fecal Occult Bld POSITIVE (A) NEGATIVE   No results found.  Pending Labs Unresulted Labs (From admission, onward)    Start     Ordered   12/13/18 0904  ABO/Rh  Once,   R     12/13/18 0904          Vitals/Pain Today's Vitals   12/13/18 0850 12/13/18 0930 12/13/18 1000 12/13/18 1030  BP:  128/79 125/73 120/69  Pulse:  84 61 (!) 59  Resp:  (!) _0 Temp:      TempSrc:      SpO2:  95% 97% 99%  Weight: 83.1 kg     Height: _1  (1.676 m)       Isolation Precautions No active isolations  Medications Medications  0.9 %  sodium chloride infusion ( Intravenous New Bag/Given 12/13/18 0958)    Mobility walks with device High fall risk    R Recommendations: See Admitting Provider Note  Report given to:   Additional Notes:

## 2018-12-14 ENCOUNTER — Ambulatory Visit: Payer: Medicare Other | Admitting: Physician Assistant

## 2018-12-14 DIAGNOSIS — I480 Paroxysmal atrial fibrillation: Secondary | ICD-10-CM | POA: Diagnosis present

## 2018-12-14 DIAGNOSIS — I739 Peripheral vascular disease, unspecified: Secondary | ICD-10-CM | POA: Diagnosis present

## 2018-12-14 DIAGNOSIS — K5731 Diverticulosis of large intestine without perforation or abscess with bleeding: Secondary | ICD-10-CM | POA: Diagnosis present

## 2018-12-14 DIAGNOSIS — E785 Hyperlipidemia, unspecified: Secondary | ICD-10-CM

## 2018-12-14 DIAGNOSIS — I2510 Atherosclerotic heart disease of native coronary artery without angina pectoris: Secondary | ICD-10-CM | POA: Diagnosis present

## 2018-12-14 DIAGNOSIS — D62 Acute posthemorrhagic anemia: Secondary | ICD-10-CM | POA: Diagnosis present

## 2018-12-14 DIAGNOSIS — Z8719 Personal history of other diseases of the digestive system: Secondary | ICD-10-CM | POA: Diagnosis not present

## 2018-12-14 DIAGNOSIS — K921 Melena: Secondary | ICD-10-CM

## 2018-12-14 DIAGNOSIS — D7589 Other specified diseases of blood and blood-forming organs: Secondary | ICD-10-CM | POA: Diagnosis present

## 2018-12-14 DIAGNOSIS — K432 Incisional hernia without obstruction or gangrene: Secondary | ICD-10-CM | POA: Diagnosis present

## 2018-12-14 DIAGNOSIS — F0280 Dementia in other diseases classified elsewhere without behavioral disturbance: Secondary | ICD-10-CM

## 2018-12-14 DIAGNOSIS — H3530 Unspecified macular degeneration: Secondary | ICD-10-CM | POA: Diagnosis present

## 2018-12-14 DIAGNOSIS — G8929 Other chronic pain: Secondary | ICD-10-CM | POA: Diagnosis present

## 2018-12-14 DIAGNOSIS — K625 Hemorrhage of anus and rectum: Secondary | ICD-10-CM | POA: Diagnosis not present

## 2018-12-14 DIAGNOSIS — K219 Gastro-esophageal reflux disease without esophagitis: Secondary | ICD-10-CM | POA: Diagnosis present

## 2018-12-14 DIAGNOSIS — R791 Abnormal coagulation profile: Secondary | ICD-10-CM | POA: Diagnosis present

## 2018-12-14 DIAGNOSIS — I712 Thoracic aortic aneurysm, without rupture: Secondary | ICD-10-CM | POA: Diagnosis present

## 2018-12-14 DIAGNOSIS — F05 Delirium due to known physiological condition: Secondary | ICD-10-CM | POA: Diagnosis present

## 2018-12-14 DIAGNOSIS — G309 Alzheimer's disease, unspecified: Secondary | ICD-10-CM

## 2018-12-14 DIAGNOSIS — K649 Unspecified hemorrhoids: Secondary | ICD-10-CM | POA: Diagnosis present

## 2018-12-14 DIAGNOSIS — D539 Nutritional anemia, unspecified: Secondary | ICD-10-CM | POA: Diagnosis present

## 2018-12-14 DIAGNOSIS — Q600 Renal agenesis, unilateral: Secondary | ICD-10-CM | POA: Diagnosis not present

## 2018-12-14 DIAGNOSIS — I10 Essential (primary) hypertension: Secondary | ICD-10-CM

## 2018-12-14 DIAGNOSIS — J439 Emphysema, unspecified: Secondary | ICD-10-CM | POA: Diagnosis present

## 2018-12-14 DIAGNOSIS — Z20828 Contact with and (suspected) exposure to other viral communicable diseases: Secondary | ICD-10-CM | POA: Diagnosis present

## 2018-12-14 DIAGNOSIS — K922 Gastrointestinal hemorrhage, unspecified: Secondary | ICD-10-CM | POA: Diagnosis not present

## 2018-12-14 DIAGNOSIS — N400 Enlarged prostate without lower urinary tract symptoms: Secondary | ICD-10-CM | POA: Diagnosis present

## 2018-12-14 DIAGNOSIS — I495 Sick sinus syndrome: Secondary | ICD-10-CM | POA: Diagnosis present

## 2018-12-14 LAB — ABO/RH: ABO/RH(D): A POS

## 2018-12-14 LAB — CBC
HCT: 30.5 % — ABNORMAL LOW (ref 39.0–52.0)
HCT: 31.8 % — ABNORMAL LOW (ref 39.0–52.0)
Hemoglobin: 9.9 g/dL — ABNORMAL LOW (ref 13.0–17.0)
Hemoglobin: 10.5 g/dL — ABNORMAL LOW (ref 13.0–17.0)
MCH: 33.4 pg (ref 26.0–34.0)
MCH: 33.3 pg (ref 26.0–34.0)
MCHC: 32.5 g/dL (ref 30.0–36.0)
MCHC: 33 g/dL (ref 30.0–36.0)
MCV: 103 fL — ABNORMAL HIGH (ref 80.0–100.0)
MCV: 101 fL — ABNORMAL HIGH (ref 80.0–100.0)
Platelets: 158 10*3/uL (ref 150–400)
Platelets: 161 10*3/uL (ref 150–400)
RBC: 2.96 MIL/uL — ABNORMAL LOW (ref 4.22–5.81)
RBC: 3.15 MIL/uL — ABNORMAL LOW (ref 4.22–5.81)
RDW: 13.8 % (ref 11.5–15.5)
RDW: 13.5 % (ref 11.5–15.5)
WBC: 5.5 10*3/uL (ref 4.0–10.5)
WBC: 5.9 10*3/uL (ref 4.0–10.5)
nRBC: 0 % (ref 0.0–0.2)
nRBC: 0 % (ref 0.0–0.2)

## 2018-12-14 LAB — BASIC METABOLIC PANEL
Anion gap: 8 (ref 5–15)
BUN: 15 mg/dL (ref 8–23)
CO2: 26 mmol/L (ref 22–32)
Calcium: 8.8 mg/dL — ABNORMAL LOW (ref 8.9–10.3)
Chloride: 107 mmol/L (ref 98–111)
Creatinine, Ser: 0.68 mg/dL (ref 0.61–1.24)
GFR calc Af Amer: >60 mL/min (ref >60)
GFR calc non Af Amer: >60 mL/min (ref >60)
Glucose, Bld: 109 mg/dL — ABNORMAL HIGH (ref 70–99)
Potassium: 3.9 mmol/L (ref 3.5–5.1)
Sodium: 141 mmol/L (ref 135–145)

## 2018-12-14 LAB — VITAMIN B12: Vitamin B-12: 698 pg/mL (ref 180–914)

## 2018-12-14 LAB — PROTIME-INR
INR: 1.3 — ABNORMAL HIGH (ref 0.8–1.2)
Prothrombin Time: 15.6 seconds — ABNORMAL HIGH (ref 11.4–15.2)

## 2018-12-14 MED ORDER — PHYTONADIONE 5 MG PO TABS
5.0000 mg | ORAL_TABLET | Freq: Every day | ORAL | Status: AC
  Administered 2018-12-14 – 2018-12-16 (×3): 5 mg via ORAL
  Filled 2018-12-14 (×3): qty 1

## 2018-12-14 NOTE — Progress Notes (Signed)
PROGRESS NOTE    Brad [name]  YIR:[id number] DOB: 1928-04-14 DOA: 12/13/2018 PCP: [name], MD   Brief Narrative: Brad [name] is a 85 y.o. male with medical history significant of pacemaker placement; s/p AAA repair; PAF on Eliquis; macular degeneration; HTN; HLD; CAD s/p stent; and COPD. Patient presented secondary to rectal bleeding.    Assessment & Plan:   Principal Problem:   Lower GI bleeding Active Problems:   Essential hypertension   Hyperlipemia   Atrial fibrillation (HCC)   Dementia due to Alzheimer's disease ([place])   Lower GI bleeding History of diverticular disease which is most likely etiology. Continues to have frequent episodes of hematochezia. Hemoglobin is steadily dropping -CBC q12 hours -GI consult  Acute blood loss anemia Worsening anemia in setting of continued hematochezia. CBC pending this morning. Multiple episodes overnight. -Trend CBC  Essential hypertension On metoprolol and Imdur as an outpatient -Continue metoprolol and Imdur for now. If blood pressure decreases, will discontinue Imdur  Atrial fibrillation Currently rate controlled. On Eliquis as an outpatient. -Hold Eliquis -Continue metoprolol  Dementia Patient is on Namenda and Aricept -Continue Namenda and Aricept  Hyperlipidemia -Continue Crestor  Aortic aneurysm Stable on CT angio  Elevated INR Mild. Recheck.   DVT prophylaxis: SCDs Code Status:   Code Status: DNR Family Communication: None at bedside Disposition Plan: Discharge pending resolution of bleeding and/or stabilization of hemoglobin   Consultants:   None  Procedures:   None  Antimicrobials:  None    Subjective: No issues this morning. Has had multiple bloody stools.  Objective: Vitals:   12/13/18 1144 12/13/18 2005 12/14/18 0407 12/14/18 0500  BP: 140/72 (!) 146/83 (!) 164/82 (!) 158/85  Pulse: 63 62 61   Resp: (!) 23 18 18    Temp: 97.6 F (36.4 C) 98.2 F (36.8 C) 98.3 F  (36.8 C)   TempSrc: Oral Oral Oral   SpO2: 99% 96% 96%   Weight: 82.4 kg     Height: 5\' 6"  (1.676 m)       Intake/Output Summary (Last 24 hours) at 12/14/2018 0749 Last data filed at 12/14/2018 0415 Gross per 24 hour  Intake 346.5 ml  Output -  Net 346.5 ml   Filed Weights   12/13/18 0850 12/13/18 1144  Weight: 83.1 kg 82.4 kg    Examination:  General exam: Appears calm and comfortable Respiratory system: Clear to auscultation. Respiratory effort normal. Cardiovascular system: S1 & S2 heard, RRR. No murmurs, rubs, gallops or clicks. Gastrointestinal system: Abdomen is nondistended, soft and nontender. No organomegaly or masses felt. Normal bowel sounds heard. Central nervous system: Alert and oriented to person and place. No focal neurological deficits. Extremities: No edema. No calf tenderness Skin: No cyanosis. No rashes Psychiatry: Judgement and insight appear normal. Mood & affect appropriate.     Data Reviewed: I have personally reviewed following labs and imaging studies  CBC: Recent Labs  Lab 12/13/18 0903 12/13/18 1707  WBC 5.9 5.2  NEUTROABS 3.9  --   HGB 11.2* 10.4*  HCT 35.6* 31.7*  MCV 103.5* 102.3*  PLT 166 500   Basic Metabolic Panel: Recent Labs  Lab 12/13/18 0903  NA 144  K 4.1  CL 110  CO2 26  GLUCOSE 126*  BUN 21  CREATININE 1.03  CALCIUM 9.1   GFR: Estimated Creatinine Clearance: 49 mL/min (by C-G formula based on SCr of 1.03 mg/dL). Liver Function Tests: Recent Labs  Lab 12/13/18 0903  AST 19  ALT 16  ALKPHOS 42  BILITOT 0.9  PROT 6.2*  ALBUMIN 3.4*   No results for input(s): LIPASE, AMYLASE in the last 168 hours. No results for input(s): AMMONIA in the last 168 hours. Coagulation Profile: Recent Labs  Lab 12/13/18 0903  INR 1.4*   Cardiac Enzymes: No results for input(s): CKTOTAL, CKMB, CKMBINDEX, TROPONINI in the last 168 hours. BNP (last 3 results) No results for input(s): PROBNP in the last 8760 hours. HbA1C:  No results for input(s): HGBA1C in the last 72 hours. CBG: No results for input(s): GLUCAP in the last 168 hours. Lipid Profile: No results for input(s): CHOL, HDL, LDLCALC, TRIG, CHOLHDL, LDLDIRECT in the last 72 hours. Thyroid Function Tests: No results for input(s): TSH, T4TOTAL, FREET4, T3FREE, THYROIDAB in the last 72 hours. Anemia Panel: No results for input(s): VITAMINB12, FOLATE, FERRITIN, TIBC, IRON, RETICCTPCT in the last 72 hours. Sepsis Labs: No results for input(s): PROCALCITON, LATICACIDVEN in the last 168 hours.  Recent Results (from the past 240 hour(s))  SARS Coronavirus 2 (CEPHEID - Performed in [place] hospital lab), Hosp Order     Status: None   Collection Time: 12/13/18  9:10 AM   Specimen: Nasopharyngeal Swab  Result Value Ref Range Status   SARS Coronavirus 2 NEGATIVE NEGATIVE Final    Comment: (NOTE) If result is NEGATIVE SARS-CoV-2 target nucleic acids are NOT DETECTED. The SARS-CoV-2 RNA is generally detectable in upper and lower  respiratory specimens during the acute phase of infection. The lowest  concentration of SARS-CoV-2 viral copies this assay can detect is 250  copies / mL. A negative result does not preclude SARS-CoV-2 infection  and should not be used as the sole basis for treatment or other  patient management decisions.  A negative result may occur with  improper specimen collection / handling, submission of specimen other  than nasopharyngeal swab, presence of viral mutation(s) within the  areas targeted by this assay, and inadequate number of viral copies  (<250 copies / mL). A negative result must be combined with clinical  observations, patient history, and epidemiological information. If result is POSITIVE SARS-CoV-2 target nucleic acids are DETECTED. The SARS-CoV-2 RNA is generally detectable in upper and lower  respiratory specimens dur ing the acute phase of infection.  Positive  results are indicative of active infection with  SARS-CoV-2.  Clinical  correlation with patient history and other diagnostic information is  necessary to determine patient infection status.  Positive results do  not rule out bacterial infection or co-infection with other viruses. If result is PRESUMPTIVE POSTIVE SARS-CoV-2 nucleic acids MAY BE PRESENT.   A presumptive positive result was obtained on the submitted specimen  and confirmed on repeat testing.  While 2019 novel coronavirus  (SARS-CoV-2) nucleic acids may be present in the submitted sample  additional confirmatory testing may be necessary for epidemiological  and / or clinical management purposes  to differentiate between  SARS-CoV-2 and other Sarbecovirus currently known to infect humans.  If clinically indicated additional testing with an alternate test  methodology [id number]) is advised. The SARS-CoV-2 RNA is generally  detectable in upper and lower respiratory sp ecimens during the acute  phase of infection. The expected result is Negative. Fact Sheet for Patients:  StrictlyIdeas.no Fact Sheet for Healthcare Providers: BankingDealers.co.za This test is not yet approved or cleared by the [place] FDA and has been authorized for detection and/or diagnosis of SARS-CoV-2 by FDA under an Emergency Use Authorization (EUA).  This EUA will remain in effect (meaning this test can be  used) for the duration of the COVID-19 declaration under Section 564(b)(1) of the Act, 21 U.S.C. section 360bbb-3(b)(1), unless the authorization is terminated or revoked sooner. Performed at [facility], [place] [address], [place], [place] [address]          Radiology Studies: No results found.      Scheduled Meds: . clotrimazole   Topical BID  . donepezil  10 mg Oral BH-q7a  . finasteride  5 mg Oral Daily  . isosorbide mononitrate  30 mg Oral Daily  . loratadine  10 mg Oral Daily  . memantine  28 mg Oral QHS  . metoprolol  tartrate  25 mg Oral BID  . rosuvastatin  5 mg Oral QHS  . tamsulosin  0.8 mg Oral QHS   Continuous Infusions:   LOS: 0 days     Cordelia Poche, MD [facility] 12/14/2018, 7:49 AM  If 7PM-7AM, please contact night-coverage [email or web address]

## 2018-12-14 NOTE — Progress Notes (Signed)
Pt is getting restless and getting up to the bathroom without assistance.  Due to his fall risk, AC authorized for son [name] to spend the night to assist reorienting pt.

## 2018-12-14 NOTE — Consult Note (Addendum)
[facility] Consult: 1:54 PM 12/14/2018  LOS: 0 days    Referring Provider: Dr. Lonny Prude Primary Care Physician:  [name], MD in [facility] Primary Gastroenterologist:  Dr. Scarlette Shorts   Reason for Consultation: Painless hematochezia.   HPI: Brad [name] is a 85 y.o. male.  PMH CAD.  Chronic Eliquis.  Aortic aneurysm, status post repair.  Sinus node dysfunction, pacemaker placed 06/2018.  Dementia.  COPD, emphysema.  Congenital solitary kidney.  PAF.  Multiple, painless abdominal hernias.  04/2008 colonoscopy for surveillance of hx polyps 2004, none on repeat study 2005.  Dr. Henrene Pastor noted severe left colon diverticulosis, no recurrent polyps.   Seen in the ED for evaluation of acute, severe right shoulder and upper back pain 10/07/2018 CT angio chest, abdomen, pelvis.  For evaluation of acute, severe, right shoulder, right upper back pain.  No acute aortic dissection.  Stable a sending aortic aneurysm 5.1 cm.  Stable, small, 6 mm penetrating ulcer at transverse arch.  S/p infrarenal abdominal aortic graft, no associated high-grade stenosis or occlusive disease.  Aneurysmal dilation of left internal iliac artery and bilateral common femoral arteries.  Mild, 3.2 sonometer aneurysmal dilation of suprarenal abdominal aorta.  Solitary right kidney.  No acute intra-abdominal or pelvic abnormality.  Low ventral hernia containing mesenteric fat and small bowel without obstruction or incarceration.  Emphysema. As he had baseline level of hematuria, apparently a common occurrence for this patient, there was some suspicion that he may have passed a kidney stone.   Pt reports bleeding per rectum 2 days ago, it resolved and he had no bleeding yesterday.  Painless rectal bleeding recurred this morning.  Feels tired, weak.  No  abdominal pain, nausea, vomiting, shortness of breath, chest pain.  In the last month patient has seen some blood in his urine but no prior bleeding per rectum. Today he has had 4 episodes of bright red blood, not burgundy not melenic stool.  He denies dizziness.  No NSAIDs.    Hgb 11.2 >> 9.9.  Was 11.5 on 10/07/18.   MCV 103.  Platelets okay. PT/INR 17.3/1.4. COVID-19 negative.  Patient lost his wife of many years about 2 months ago.  He does not drink alcohol.  Previously smoked but no longer. Fm hx reviewed.  Listed below.  Past Medical History:  Diagnosis Date  . CAD (coronary artery disease)    s/p stent to OM2 in past;  Last LHC 3/05: EF 60%, left RA occluded, right RA ok, prox to mid LAD 60%, oD1 75-80%, oD2 small 75%, pOM1 50%, OM2 stent ok with 50% before stent, mRCA 30%, dRCA 30%, PDA 70%.  Last dobutamine myoview 5/06: no scar or ischemia, EF 60%.  . Chronic lower back pain   . COPD (chronic obstructive pulmonary disease) ([place])   . DJD (degenerative joint disease)   . Enlarged prostate   . Ethanolism ([place])   . GERD (gastroesophageal reflux disease)   . Hernia, abdominal    "he has ~ 4; wears binder" (11/20/2015)  . HLD (hyperlipidemia)   . HTN (  hypertension)    Last echo 1/07:  Normal LVF, LV thickness upper limits of normal, mild aortic root dilatation, mild to mod MAC, mild LAE, borderline RVH, mild RAE.  . Macular degeneration   . Pacemaker    DR Radford Pax; a. s/p MDT dual chamber PPM with His Bundle pacing  . PAD (peripheral artery disease) ([place])   . Paroxysmal atrial fibrillation (HCC)   . Psoriasis   . S/P AAA repair    Dr. Roxan Hockey  . Scoliosis   . Tobacco abuse   . Unilateral congenital absence of kidney     Past Surgical History:  Procedure Laterality Date  . APPENDECTOMY    . CARPAL TUNNEL RELEASE Right 12/26/2014   Procedure: RIGHT ENDOSCOPIC CARPAL TUNNEL SYNDROME RELEASE;  Surgeon: Milly Jakob, MD;  Location: [facility];  Service:  Orthopedics;  Laterality: Right;  . CATARACT EXTRACTION, BILATERAL Bilateral   . CORONARY ANGIOPLASTY WITH STENT PLACEMENT     s/p stent to OM2 in past;   . EP IMPLANTABLE DEVICE N/A 11/20/2015   Procedure: Pacemaker Implant;  Surgeon: Evans Lance, MD;  Location: [facility];  Service: Cardiovascular;  Laterality: N/A;  . HERNIA REPAIR    . INSERT / REPLACE / REMOVE PACEMAKER  11/20/2015  . RESECTION AND GRAFTING OF INFARENAL  ABDOMINAL  AORTIC ANEURYSM  05/03/2002    Prior to Admission medications   Medication Sig Start Date End Date Taking? Authorizing Provider  acetaminophen (TYLENOL ARTHRITIS PAIN) 650 MG CR tablet Take 650 mg by mouth 2 (two) times a day.    Yes [provider]  b complex vitamins tablet Take 1 tablet by mouth daily.   Yes [provider]  Butenafine HCl 1 % cream Apply twice daily to affected areas Patient taking differently: Apply 1 application topically 2 (two) times daily. Apply twice daily to affected areas 11/26/18  Yes Stacks, Cletus Gash, MD  celecoxib (CELEBREX) 400 MG capsule Take 1 capsule (400 mg total) by mouth daily. FOR FOOT, MUSCLE, JOINT PAIN AND SWELLING 12/04/18  Yes Stacks, Cletus Gash, MD  donepezil (ARICEPT) 10 MG tablet TAKE 1 TABLET BY MOUTH EVERY DAY Patient taking differently: Take 10 mg by mouth every morning. TAKE 1 TABLET BY MOUTH EVERY DAY 10/27/18  Yes Cameron Sprang, MD  ELIQUIS 5 MG TABS tablet TAKE 1 TABLET BY MOUTH TWICE A DAY Patient taking differently: Take 5 mg by mouth 2 (two) times daily.  10/26/18  Yes Stacks, Cletus Gash, MD  fexofenadine (ALLEGRA) 180 MG tablet TAKE 1 TABLET (180 MG TOTAL) BY MOUTH DAILY. FOR ALLERGY SYMPTOMS 09/04/18  Yes Stacks, Cletus Gash, MD  finasteride (PROSCAR) 5 MG tablet TAKE 1 TABLET BY MOUTH EVERY DAY Patient taking differently: Take 5 mg by mouth daily.  09/21/18  Yes [name], MD  fish oil-omega-3 fatty acids 1000 MG capsule Take 1 g by mouth at bedtime.    Yes [provider]   fluticasone (FLONASE) 50 MCG/ACT nasal spray SPRAY 2 SPRAYS INTO EACH NOSTRIL EVERY DAY Patient taking differently: Place 1 spray into both nostrils daily as needed for allergies.  11/10/17  Yes Stacks, Cletus Gash, MD  furosemide (LASIX) 20 MG tablet TAKE 1 TABLET BY MOUTH EVERY DAY IN THE MORNING Patient taking differently: Take 20 mg by mouth daily. TAKE 1 TABLET BY MOUTH EVERY DAY IN THE MORNING 11/27/18  Yes Stacks, Cletus Gash, MD  isosorbide mononitrate (IMDUR) 30 MG 24 hr tablet TAKE 1 TABLET BY MOUTH EVERY DAY Patient taking differently: Take  30 mg by mouth daily.  10/02/18  Yes Stacks, Cletus Gash, MD  KLOR-CON M20 20 MEQ tablet TAKE 1 TABLET BY MOUTH EVERY DAY Patient taking differently: Take 20 mEq by mouth daily.  11/30/18  Yes [name], MD  memantine (NAMENDA XR) 28 MG CP24 24 hr capsule Take 1 capsule (28 mg total) by mouth daily. Patient taking differently: Take 28 mg by mouth at bedtime.  07/20/18  Yes [name], MD  metoprolol tartrate (LOPRESSOR) 25 MG tablet Take 1 tablet (25 mg total) by mouth 2 (two) times daily. 04/21/18  Yes StacksCletus Gash, MD  Multiple Vitamin (MULTIVITAMIN WITH MINERALS) TABS tablet Take 1 tablet by mouth every evening. PRESER VISION    Yes [provider]  rosuvastatin (CRESTOR) 5 MG tablet Take 1 tablet (5 mg total) by mouth daily. Patient taking differently: Take 5 mg by mouth at bedtime.  04/21/18  Yes [name], MD  tamsulosin (FLOMAX) 0.4 MG CAPS capsule Take 2 capsules (0.8 mg total) by mouth at bedtime. 04/21/18  Yes Stacks, Cletus Gash, MD  TURMERIC CURCUMIN PO Take 1 tablet by mouth at bedtime.    Yes [provider]    Scheduled Meds: . clotrimazole   Topical BID  . donepezil  10 mg Oral BH-q7a  . finasteride  5 mg Oral Daily  . isosorbide mononitrate  30 mg Oral Daily  . loratadine  10 mg Oral Daily  . memantine  28 mg Oral QHS  . metoprolol tartrate  25 mg Oral BID  . rosuvastatin  5 mg Oral QHS  . tamsulosin  0.8 mg Oral QHS    Infusions:  PRN Meds: acetaminophen **OR** acetaminophen, ondansetron **OR** ondansetron (ZOFRAN) IV   Allergies as of 12/13/2018 - Review Complete 12/13/2018  Allergen Reaction Noted  . Aspirin Shortness Of Breath and Swelling 04/19/2008  . Atorvastatin Other (See Comments) 05/27/2008  . Cephalexin Other (See Comments)     Family History  Problem Relation Age of Onset  . Cancer Mother        uterine  . Stroke Mother        hemi plagic  . Cancer Sister   . COPD Sister   . Early death Brother   . Cancer Brother        Lip  . Stroke Father   . AAA (abdominal aortic aneurysm) Son   . Arthritis Son        knee  . COPD Brother   . Cancer Son        testicular   . Hypertension Son   . Early death Sister     Social History   Socioeconomic History  . Marital status: Widowed    Spouse name: Not on file  . Number of children: 3  . Years of education: Not on file  . Highest education level: Not on file  Occupational History  . Occupation: retire    Comment: Event organiser  . Financial resource strain: Not on file  . Food insecurity    Worry: Not on file    Inability: Not on file  . Transportation needs    Medical: Not on file    Non-medical: Not on file  Tobacco Use  . Smoking status: Former Smoker    Packs/day: 2.50    Years: 52.00    Pack years: 130.00    Types: Cigarettes    Quit date: 05/06/1996    Years since quitting: 22.6  . Smokeless tobacco: Never Used  Substance and Sexual Activity  . Alcohol use: Yes    Comment: 11/20/2015 "used to drink heavily; quit 05/06/1996"  . Drug use: No  . Sexual activity: Never  Lifestyle  . Physical activity    Days per week: Not on file    Minutes per session: Not on file  . Stress: Not on file  Relationships  . Social Herbalist on phone: Not on file    Gets together: Not on file    Attends religious service: Not on file    Active member of club or organization: Not on file    Attends  meetings of clubs or organizations: Not on file    Relationship status: Not on file  . Intimate partner violence    Fear of current or ex partner: Not on file    Emotionally abused: Not on file    Physically abused: Not on file    Forced sexual activity: Not on file  Other Topics Concern  . Not on file  Social History Narrative  . Not on file    REVIEW OF SYSTEMS: Constitutional: Felt weak earlier, not currently. ENT:  No nose bleeds Pulm: No dyspnea.  No cough. CV:  No palpitations, no LE edema.  No chest pain GU: Sounds like fairly chronic hematuria, plan outlined in notes was referral to urology do not know if this happened yet.  No frequency GI: No nausea.  Appetite fair. Heme: Denies excessive or unusual bleeding other than the hematochezia. Transfusions: See HPI.  None in the epic record. Neuro:  No headaches, no peripheral tingling or numbness.  No seizures, no syncope. Derm:  No itching, no rash or sores.  Endocrine:  No sweats or chills.  No polyuria or dysuria Immunization: Reviewed. Travel:  None beyond local counties in last few months.    PHYSICAL EXAM: Vital signs in last 24 hours: Vitals:   12/14/18 0407 12/14/18 0500  BP: (!) 164/82 (!) 158/85  Pulse: 61   Resp: 18   Temp: 98.3 F (36.8 C)   SpO2: 96%    Wt Readings from Last 3 Encounters:  12/13/18 82.4 kg  11/26/18 83.1 kg  08/19/18 80.4 kg    General: Mildly overweight, pleasant, forgetful elderly WM.  Does not look ill. Head: Facial asymmetry or swelling.  No signs of head trauma. Eyes: No scleral icterus.  Conjunctiva is a bit pale. Ears: Not HOH. Nose: No discharge or congestion. Mouth: Full set of dentures in place, not removed for the exam.  Oral mucosa is pink, moist, clear.  Tongue midline. Neck: No JVD, no thyromegaly, no masses. Lungs: Clear bilaterally.  Good breath sounds.  No labored breathing or cough. Heart: RRR.  No MRG.  S1, S2 present Abdomen: Soft.  Active bowel sounds.  Not  tender.  At least 2 hernias arising from midline incision.  These are not tender..   Rectal: Deferred rectal exam.  RN described bright red blood. Musc/Skeltl: No swelling or erythema.  Some kyphosis.  Arthritic changes in the fingers. Extremities: Slight, nonpitting pedal edema. Neurologic: Alert.  Oriented to self and place.  Cannot recall the year.  Difficulty recalling recent events but does recall the recent bleeding.  Moves all 4 limbs.  Able to get up out of the lounge chair, a bit unsteady on his feet. Skin: A few annular erythematous lesions on the abdomen, ?  Ringworm. Tattoos: None observed. Nodes: No cervical adenopathy. Psych: Calm, cooperative, pleasant.  Follows commands  Intake/Output  from previous day: 07/26 0701 - 07/27 0700 In: 346.5 [P.O.:180; I.V.:166.5] Out: -  Intake/Output this shift: No intake/output data recorded.  LAB RESULTS: Recent Labs    12/13/18 0903 12/13/18 1707 12/14/18 0731  WBC 5.9 5.2 5.5  HGB 11.2* 10.4* 9.9*  HCT 35.6* 31.7* 30.5*  PLT 166 153 158   BMET Lab Results  Component Value Date   NA 141 12/14/2018   NA 144 12/13/2018   NA 142 10/07/2018   K 3.9 12/14/2018   K 4.1 12/13/2018   K 4.4 10/07/2018   CL 107 12/14/2018   CL 110 12/13/2018   CL 111 10/07/2018   CO2 26 12/14/2018   CO2 26 12/13/2018   CO2 23 10/07/2018   GLUCOSE 109 (H) 12/14/2018   GLUCOSE 126 (H) 12/13/2018   GLUCOSE 60 (L) 10/07/2018   BUN 15 12/14/2018   BUN 21 12/13/2018   BUN 19 10/07/2018   CREATININE 0.68 12/14/2018   CREATININE 1.03 12/13/2018   CREATININE 0.99 10/07/2018   CALCIUM 8.8 (L) 12/14/2018   CALCIUM 9.1 12/13/2018   CALCIUM 8.7 (L) 10/07/2018   LFT Recent Labs    12/13/18 0903  PROT 6.2*  ALBUMIN 3.4*  AST 19  ALT 16  ALKPHOS 42  BILITOT 0.9   PT/INR Lab Results  Component Value Date   INR 1.4 (H) 12/13/2018    RADIOLOGY STUDIES: No results found.    IMPRESSION:   *    Painless hematochezia, suspect  diverticular bleeding.  *     Macrocytic anemia.  *     Chronic Eliquis in patient with history of CAD and PAF.  Last dose 7/26 at 0700.    *   Slight coagulopathy.    PLAN:     *    At his age, not clear that colonoscopy is indicated in what is likely a diverticular bleed.  If bleeding recurs  ?Pursue nuclear medicine bleeding scan versus CT angiogram?  *   Po vitamin K B12 and folate levels at next cbc 5 PM.   Leave clear diet in place for now.     Azucena Freed  12/14/2018, 1:54 PM Phone [phone]

## 2018-12-14 NOTE — TOC Initial Note (Signed)
Transition of Care [facility]) - Initial/Assessment Note    Patient Details  Name: Brad [name] MRN: [id number] Date of Birth: 1927/12/22  Transition of Care [facility]) CM/SW Contact:    Marilu Favre, RN Phone Number: 12/14/2018, 11:57 AM  Clinical Narrative:                  Spoke to patient's son [name] [phone] via phone.  Patient from home alone however, has multiple family members close by who assists him. Patient's son Heffley lives next door to patient and makes sure patient gets breakfast and dinner everyday. [name] lives 20 miles and helps with breakfast and dinner three times a week and takes patient to appointments. Patient also has other family members close by to assist him .Barriers to Discharge: Continued Medical Work up   Patient Goals and CMS Choice   CMS Medicare.gov Compare Post Acute Care list provided to:: Patient Represenative (must comment)(SOn [name] [phone])    Expected Discharge Plan and Services Expected Discharge Plan: [place] arrangements for the past 2 months: Single Family Home                                      Prior Living Arrangements/Services Living arrangements for the past 2 months: Single Family Home Lives with:: Self Patient language and need for interpreter reviewed:: Yes        Need for Family Participation in Patient Care: Yes (Comment) Care giver support system in place?: Yes (comment)   Criminal Activity/Legal Involvement Pertinent to Current Situation/Hospitalization: No - Comment as needed  Activities of Daily Living      Permission Sought/Granted   Permission granted to share information with : Yes, Verbal Permission Granted  Share Information with NAME: [name]     Permission granted to share info w Relationship: son     Emotional Assessment Appearance:: Appears stated age     Orientation: : Fluctuating Orientation (Suspected and/or reported [place])      Admission  diagnosis:  Acute lower GI bleeding [K92.2] Chronic anticoagulation [Z79.01] Patient Active Problem List   Diagnosis Date Noted  . Lower GI bleeding 12/13/2018  . Dementia due to Alzheimer's disease ([place]) 10/02/2018  . Blood in urine 12/04/2016  . Mild dementia ([place]) 07/03/2016  . Atrial fibrillation ([place]) 11/21/2015  . Sinus node dysfunction ([place]) 11/20/2015  . BPH (benign prostatic hyperplasia) 07/07/2015  . Hyperlipemia 07/07/2015  . Ventral hernia 05/31/2013  . Carpal tunnel syndrome, bilateral 05/07/2011  . Ethanolism ([place])   . COPD (chronic obstructive pulmonary disease) ([place])   . ADENOMATOUS COLONIC POLYP 04/18/2008  . Essential hypertension 04/18/2008  . Coronary atherosclerosis 04/18/2008  . AAA 04/18/2008  . PVD 04/18/2008  . GERD 04/18/2008  . DIVERTICULOSIS, COLON 04/18/2008   PCP:  [name], MD Pharmacy:   [id number] [place], [place] [address] Phone: [phone] Fax: [phone]  [facility] [place], [place] - [address] [place] [address] [place] [address] Phone: [phone] Fax: [phone]     Social Determinants of Health (SDOH) Interventions    Readmission Risk Interventions No flowsheet data found.

## 2018-12-14 NOTE — Progress Notes (Signed)
Pt had large bloody liquid stool x 4 overnight. Denies dizziness. Will monitor pt.

## 2018-12-14 NOTE — Care Management Obs Status (Signed)
[place] NOTIFICATION   Patient Details  Name: Brad [name] MRN: [id number] Date of Birth: 1928-01-08   Medicare Observation Status Notification Given:  Yes    Marilu Favre, RN 12/14/2018, 11:49 AM

## 2018-12-15 ENCOUNTER — Other Ambulatory Visit: Payer: Self-pay | Admitting: Family Medicine

## 2018-12-15 DIAGNOSIS — K922 Gastrointestinal hemorrhage, unspecified: Secondary | ICD-10-CM

## 2018-12-15 LAB — CBC
HCT: 29.4 % — ABNORMAL LOW (ref 39.0–52.0)
Hemoglobin: 9.6 g/dL — ABNORMAL LOW (ref 13.0–17.0)
MCH: 33.2 pg (ref 26.0–34.0)
MCHC: 32.7 g/dL (ref 30.0–36.0)
MCV: 101.7 fL — ABNORMAL HIGH (ref 80.0–100.0)
Platelets: 157 10*3/uL (ref 150–400)
RBC: 2.89 MIL/uL — ABNORMAL LOW (ref 4.22–5.81)
RDW: 13.6 % (ref 11.5–15.5)
WBC: 5.8 10*3/uL (ref 4.0–10.5)
nRBC: 0 % (ref 0.0–0.2)

## 2018-12-15 LAB — FOLATE RBC
Folate, Hemolysate: 412 ng/mL
Folate, RBC: 1351 ng/mL (ref >498)
Hematocrit: 30.5 % — ABNORMAL LOW (ref 37.5–51.0)

## 2018-12-15 LAB — PROTIME-INR
INR: 1.3 — ABNORMAL HIGH (ref 0.8–1.2)
Prothrombin Time: 15.7 seconds — ABNORMAL HIGH (ref 11.4–15.2)

## 2018-12-15 NOTE — Progress Notes (Addendum)
Daily Rounding Note  12/15/2018, 9:25 AM  LOS: 1 day   SUBJECTIVE:   Chief complaint:   Painless hematochezia    No further episodes of bleeding after yesterday morning.  No bowel movements or bleeding since then.  Patient feels well.  He is still on clear liquids..  OBJECTIVE:         Vital signs in last 24 hours:    Temp:  [97.9 F (36.6 C)-98.6 F (37 C)] 98.6 F (37 C) (07/28 0634) Pulse Rate:  [69-98] 98 (07/28 0634) Resp:  [18-20] 20 (07/28 0634) BP: (108-130)/(62-68) 108/68 (07/28 0634) SpO2:  [98 %-99 %] 98 % (07/28 0634) Last BM Date: 12/15/18 Filed Weights   12/13/18 0850 12/13/18 1144  Weight: 83.1 kg 82.4 kg   General: Looks well.  Comfortable.  Alert. Heart: Irregularly irregular, rate controlled.  Pacemaker palpable in upper left sternum. Chest: Clear bilaterally.  No labored breathing.  No cough. Abdomen: Soft.  Not tender.  Ventral hernias.  Active bowel sounds. Extremities: No CCE. Neuro/Psych: Pleasant, cooperative, calm.  Appropriate.  Not confused  Intake/Output from previous day: 07/27 0701 - 07/28 0700 In: 1460 [P.O.:1460] Out: -   Intake/Output this shift: No intake/output data recorded.  Lab Results: Recent Labs    12/14/18 0731 12/14/18 1638 12/15/18 0629  WBC 5.5 5.9 5.8  HGB 9.9* 10.5* 9.6*  HCT 30.5* 31.8* 29.4*  PLT 158 161 157   BMET Recent Labs    12/13/18 0903 12/14/18 0731  NA 144 141  K 4.1 3.9  CL 110 107  CO2 26 26  GLUCOSE 126* 109*  BUN 21 15  CREATININE 1.03 0.68  CALCIUM 9.1 8.8*   LFT Recent Labs    12/13/18 0903  PROT 6.2*  ALBUMIN 3.4*  AST 19  ALT 16  ALKPHOS 42  BILITOT 0.9   PT/INR Recent Labs    12/14/18 1638 12/15/18 0629  LABPROT 15.6* 15.7*  INR 1.3* 1.3*   Hepatitis Panel No results for input(s): HEPBSAG, HCVAB, HEPAIGM, HEPBIGM in the last 72 hours.  Studies/Results: No results found.   Scheduled Meds: .  clotrimazole   Topical BID  . donepezil  10 mg Oral BH-q7a  . finasteride  5 mg Oral Daily  . isosorbide mononitrate  30 mg Oral Daily  . loratadine  10 mg Oral Daily  . memantine  28 mg Oral QHS  . metoprolol tartrate  25 mg Oral BID  . phytonadione  5 mg Oral Daily  . rosuvastatin  5 mg Oral QHS  . tamsulosin  0.8 mg Oral QHS   Continuous Infusions: PRN Meds:.acetaminophen **OR** acetaminophen, ondansetron **OR** ondansetron (ZOFRAN) IV   ASSESMENT:   *     Painless hematochezia.  Suspect diverticular bleed. Bleeding resolved off Eliquis.  *     Blood loss anemia.  Macrocytosis.  B12 normal, folate level pending  *     Chronic Eliquis.   Last dose a.m. 7/26.  CAD, previous cardiac stents.  PAF.   Mild elevation PT/INR.  Day 2/3 oral vitamin K  *     Ventral/incisional hernias.  Asymptomatic.   PLAN   *     Continue observation.  Advance patient to heart healthy diet.  *      CBC in the morning.  *       Would be valuable to have cardiology input regarding necessity for Eliquis.  At the very least this should be  held for another several days.    Brad [name]  12/15/2018, 9:25 AM Phone [phone]

## 2018-12-15 NOTE — Evaluation (Signed)
Physical Therapy Evaluation Patient Details Name: Brad [name] MRN: [id number] DOB: Jul 12, 1927 Today's Date: 12/15/2018   History of Present Illness  Pt is an 85 y/o male admitted secondary to GI bleed. PMH includes HTN, a fib, dementia, s/p pacemaker, s/p AAA repair, CAD s/p stent, and COPD.   Clinical Impression  Pt admitted secondary to problem above with deficits below. Pt requiring min to min guard A for mobility using RW this session. Educated pt and pt's son about need for 24/7 assist initially and use of RW to increase safety at home; both agreeable. Will continue to follow acutely to maximize functional mobility independence and safety.     Follow Up Recommendations Home health PT;Supervision/Assistance - 24 hour(initially)    Equipment Recommendations  None recommended by PT    Recommendations for Other Services       Precautions / Restrictions Precautions Precautions: Fall Restrictions Weight Bearing Restrictions: No      Mobility  Bed Mobility               General bed mobility comments: In chair upon entry   Transfers Overall transfer level: Needs assistance Equipment used: Rolling walker (2 wheeled) Transfers: Sit to/from Stand Sit to Stand: Min assist         General transfer comment: Min A for steadying assist.   Ambulation/Gait Ambulation/Gait assistance: Min guard Gait Distance (Feet): 100 Feet Assistive device: Rolling walker (2 wheeled) Gait Pattern/deviations: Step-through pattern;Decreased stride length;Trunk flexed Gait velocity: Decreased    General Gait Details: Slow, mildly unsteady gait. Required cues for safe use of RW and proximity to device. Min guard for steadying assist.   Stairs            Wheelchair Mobility    Modified Rankin (Stroke Patients Only)       Balance Overall balance assessment: Needs assistance Sitting-balance support: No upper extremity supported;Feet supported Sitting balance-[name] Scale:  Good     Standing balance support: Bilateral upper extremity supported;During functional activity Standing balance-[name] Scale: Poor Standing balance comment: Reliant on BUE support                              Pertinent Vitals/Pain Pain Assessment: No/denies pain    Home Living Family/patient expects to be discharged to:: Private residence Living Arrangements: Alone Available Help at Discharge: Family;Available 24 hours/day Type of Home: House Home Access: Ramped entrance     Home Layout: One level Home Equipment: Walker - 2 wheels;Cane - single point Additional Comments: Son present and confirmed home information    Prior Function Level of Independence: Independent with assistive device(s);Needs assistance   Gait / Transfers Assistance Needed: Reports using cane for mobility.   ADL's / Homemaking Assistance Needed: Sons assist with meal prep and drive pt to appointments. Pt independent with ADLs        Hand Dominance        Extremity/Trunk Assessment   Upper Extremity Assessment Upper Extremity Assessment: Generalized weakness    Lower Extremity Assessment Lower Extremity Assessment: Generalized weakness    Cervical / Trunk Assessment Cervical / Trunk Assessment: Kyphotic  Communication   Communication: HOH  Cognition Arousal/Alertness: Awake/alert Behavior During Therapy: WFL for tasks assessed/performed Overall Cognitive Status: History of cognitive impairments - at baseline  General Comments: Dementia at baseline       General Comments General comments (skin integrity, edema, etc.): Educated pt and pt's son about increased need for assist at d/c and son reports he can provide. Educated about using RW at home to increase safety with mobility.     Exercises     Assessment/Plan    PT Assessment Patient needs continued PT services  PT Problem List Decreased strength;Decreased balance;Decreased  mobility;Decreased knowledge of precautions;Decreased knowledge of use of DME;Decreased cognition       PT Treatment Interventions Gait training;DME instruction;Functional mobility training;Therapeutic activities;Therapeutic exercise;Balance training;Stair training;Patient/family education    PT Goals (Current goals can be found in the Care Plan section)  Acute Rehab PT Goals Patient Stated Goal: for pt to return home PT Goal Formulation: With patient Time For Goal Achievement: 12/29/18 Potential to Achieve Goals: Good    Frequency Min 3X/week   Barriers to discharge        Co-evaluation               AM-PAC PT "6 Clicks" Mobility  Outcome Measure Help needed turning from your back to your side while in a flat bed without using bedrails?: A Little Help needed moving from lying on your back to sitting on the side of a flat bed without using bedrails?: A Little Help needed moving to and from a bed to a chair (including a wheelchair)?: A Little Help needed standing up from a chair using your arms (e.g., wheelchair or bedside chair)?: A Little Help needed to walk in hospital room?: A Little Help needed climbing 3-5 steps with a railing? : A Lot 6 Click Score: 17    End of Session Equipment Utilized During Treatment: Gait belt Activity Tolerance: Patient tolerated treatment well Patient left: in chair;with call bell/phone within reach;with family/visitor present Nurse Communication: Mobility status PT Visit Diagnosis: Unsteadiness on feet (R26.81);Muscle weakness (generalized) (M62.81)    Time: 0034-9179 PT Time Calculation (min) (ACUTE ONLY): 14 min   Charges:   PT Evaluation $PT Eval Low Complexity: [name], PT, DPT  Acute Rehabilitation Services  Pager: [phone] Office: [phone]   Rudean Hitt 12/15/2018, 5:15 PM

## 2018-12-15 NOTE — Progress Notes (Signed)
PROGRESS NOTE    Brad [name]  PNT:[id number] DOB: 1927/12/05 DOA: 12/13/2018 PCP: [name], MD   Brief Narrative: Brad [name] is a 85 y.o. male with medical history significant of pacemaker placement; s/p AAA repair; PAF on Eliquis; macular degeneration; HTN; HLD; CAD s/p stent; and COPD. Patient presented secondary to rectal bleeding.    Assessment & Plan:   Principal Problem:   Lower GI bleeding Active Problems:   Essential hypertension   Hyperlipemia   Atrial fibrillation (HCC)   Dementia due to Alzheimer's disease ([place])   Lower GI bleeding History of diverticular disease which is most likely etiology. Continues to have frequent episodes of hematochezia. Hemoglobin is stable today -CBC in AM -GI recommendations pending today  Acute blood loss anemia Worsening anemia in setting of continued hematochezia initially. Hemoglobin now stable. Hematochezia slowed down overnight. -Trend CBC  Essential hypertension On metoprolol and Imdur as an outpatient -Continue metoprolol and Imdur for now. If blood pressure decreases, will discontinue Imdur  Atrial fibrillation Currently rate controlled. On Eliquis as an outpatient. CHA2DS2-VASc Score is 4, high risk, and stroke risk is about 4.8%. -Hold Eliquis -Continue metoprolol  Dementia Patient is on Namenda and Aricept -Continue Namenda and Aricept  Hyperlipidemia -Continue Crestor  Aortic aneurysm Stable on CT angio  Elevated INR Mild. Recheck.   DVT prophylaxis: SCDs Code Status:   Code Status: DNR Family Communication: Son at bedside Disposition Plan: Discharge pending resolution of bleeding and/or stabilization of hemoglobin   Consultants:   [facility]  Procedures:   None  Antimicrobials:  None    Subjective: Small amount of bloody stool last night. No other issues  Objective: Vitals:   12/14/18 0407 12/14/18 0500 12/14/18 2006 12/15/18 0634  BP: (!) 164/82 (!) 158/85 130/62 108/68   Pulse: 61  69 98  Resp: 18  18 20   Temp: 98.3 F (36.8 C)  97.9 F (36.6 C) 98.6 F (37 C)  TempSrc: Oral  Oral Oral  SpO2: 96%  99% 98%  Weight:      Height:        Intake/Output Summary (Last 24 hours) at 12/15/2018 1134 Last data filed at 12/15/2018 0639 Gross per 24 hour  Intake 900 ml  Output -  Net 900 ml   Filed Weights   12/13/18 0850 12/13/18 1144  Weight: 83.1 kg 82.4 kg    Examination:  General exam: Appears calm and comfortable Respiratory system: Clear to auscultation. Respiratory effort normal. Cardiovascular system: S1 & S2 heard, RRR. No murmurs, rubs, gallops or clicks. Gastrointestinal system: Abdomen is nondistended, soft and nontender. Normal bowel sounds heard. Central nervous system: Alert and oriented to person and place. No focal neurological deficits. Extremities: No edema. No calf tenderness Skin: No cyanosis. No rashes Psychiatry: Judgement and insight appear normal. Mood & affect appropriate.     Data Reviewed: I have personally reviewed following labs and imaging studies  CBC: Recent Labs  Lab 12/13/18 0903 12/13/18 1707 12/14/18 0731 12/14/18 1638 12/15/18 0629  WBC 5.9 5.2 5.5 5.9 5.8  NEUTROABS 3.9  --   --   --   --   HGB 11.2* 10.4* 9.9* 10.5* 9.6*  HCT 35.6* 31.7* 30.5* 31.8* 29.4*  MCV 103.5* 102.3* 103.0* 101.0* 101.7*  PLT 166 153 158 161 086   Basic Metabolic Panel: Recent Labs  Lab 12/13/18 0903 12/14/18 0731  NA 144 141  K 4.1 3.9  CL 110 107  CO2 26 26  GLUCOSE 126* 109*  BUN 21 15  CREATININE 1.03 0.68  CALCIUM 9.1 8.8*   GFR: Estimated Creatinine Clearance: 63 mL/min (by C-G formula based on SCr of 0.68 mg/dL). Liver Function Tests: Recent Labs  Lab 12/13/18 0903  AST 19  ALT 16  ALKPHOS 42  BILITOT 0.9  PROT 6.2*  ALBUMIN 3.4*   No results for input(s): LIPASE, AMYLASE in the last 168 hours. No results for input(s): AMMONIA in the last 168 hours. Coagulation Profile: Recent Labs  Lab  12/13/18 0903 12/14/18 1638 12/15/18 0629  INR 1.4* 1.3* 1.3*   Cardiac Enzymes: No results for input(s): CKTOTAL, CKMB, CKMBINDEX, TROPONINI in the last 168 hours. BNP (last 3 results) No results for input(s): PROBNP in the last 8760 hours. HbA1C: No results for input(s): HGBA1C in the last 72 hours. CBG: No results for input(s): GLUCAP in the last 168 hours. Lipid Profile: No results for input(s): CHOL, HDL, LDLCALC, TRIG, CHOLHDL, LDLDIRECT in the last 72 hours. Thyroid Function Tests: No results for input(s): TSH, T4TOTAL, FREET4, T3FREE, THYROIDAB in the last 72 hours. Anemia Panel: Recent Labs    12/14/18 1638  VITAMINB12 698   Sepsis Labs: No results for input(s): PROCALCITON, LATICACIDVEN in the last 168 hours.  Recent Results (from the past 240 hour(s))  SARS Coronavirus 2 (CEPHEID - Performed in [facility] hospital lab), Hosp Order     Status: None   Collection Time: 12/13/18  9:10 AM   Specimen: Nasopharyngeal Swab  Result Value Ref Range Status   SARS Coronavirus 2 NEGATIVE NEGATIVE Final    Comment: (NOTE) If result is NEGATIVE SARS-CoV-2 target nucleic acids are NOT DETECTED. The SARS-CoV-2 RNA is generally detectable in upper and lower  respiratory specimens during the acute phase of infection. The lowest  concentration of SARS-CoV-2 viral copies this assay can detect is 250  copies / mL. A negative result does not preclude SARS-CoV-2 infection  and should not be used as the sole basis for treatment or other  patient management decisions.  A negative result may occur with  improper specimen collection / handling, submission of specimen other  than nasopharyngeal swab, presence of viral mutation(s) within the  areas targeted by this assay, and inadequate number of viral copies  (<250 copies / mL). A negative result must be combined with clinical  observations, patient history, and epidemiological information. If result is POSITIVE SARS-CoV-2 target  nucleic acids are DETECTED. The SARS-CoV-2 RNA is generally detectable in upper and lower  respiratory specimens dur ing the acute phase of infection.  Positive  results are indicative of active infection with SARS-CoV-2.  Clinical  correlation with patient history and other diagnostic information is  necessary to determine patient infection status.  Positive results do  not rule out bacterial infection or co-infection with other viruses. If result is PRESUMPTIVE POSTIVE SARS-CoV-2 nucleic acids MAY BE PRESENT.   A presumptive positive result was obtained on the submitted specimen  and confirmed on repeat testing.  While 2019 novel coronavirus  (SARS-CoV-2) nucleic acids may be present in the submitted sample  additional confirmatory testing may be necessary for epidemiological  and / or clinical management purposes  to differentiate between  SARS-CoV-2 and other Sarbecovirus currently known to infect humans.  If clinically indicated additional testing with an alternate test  methodology [id number]) is advised. The SARS-CoV-2 RNA is generally  detectable in upper and lower respiratory sp ecimens during the acute  phase of infection. The expected result is Negative. Fact Sheet for Patients:  StrictlyIdeas.no Fact Sheet for Healthcare Providers: BankingDealers.co.za This test is not yet approved or cleared by the [place] FDA and has been authorized for detection and/or diagnosis of SARS-CoV-2 by FDA under an Emergency Use Authorization (EUA).  This EUA will remain in effect (meaning this test can be used) for the duration of the COVID-19 declaration under Section 564(b)(1) of the Act, 21 U.S.C. section 360bbb-3(b)(1), unless the authorization is terminated or revoked sooner. Performed at [facility], [place] [address], [place], [place] [address]          Radiology Studies: No results found.      Scheduled Meds: .  clotrimazole   Topical BID  . donepezil  10 mg Oral BH-q7a  . finasteride  5 mg Oral Daily  . isosorbide mononitrate  30 mg Oral Daily  . loratadine  10 mg Oral Daily  . memantine  28 mg Oral QHS  . metoprolol tartrate  25 mg Oral BID  . phytonadione  5 mg Oral Daily  . rosuvastatin  5 mg Oral QHS  . tamsulosin  0.8 mg Oral QHS   Continuous Infusions:   LOS: 1 day     Cordelia Poche, MD [facility] 12/15/2018, 11:34 AM  If 7PM-7AM, please contact night-coverage [email or web address]

## 2018-12-15 NOTE — TOC Progression Note (Signed)
Transition of Care [facility]) - Progression Note    Patient Details  Name: Brad [name] MRN: [id number] Date of Birth: 23-Mar-1928  [facility]) CM/SW Contact  Jacalyn Lefevre Edson Snowball, RN Phone Number: 12/15/2018, 3:23 PM  Clinical Narrative:     PT recommending HHPT no DME ( already has) . Spoke to patient and son [name] at bedside, discussed PT recommendations. Provided Medicare.gov list. Will follow up for their decision on home health agency.  Expected Discharge Plan: [place] Barriers to Discharge: Continued Medical Work up  Expected Discharge Plan and Services Expected Discharge Plan: [place] arrangements for the past 2 months: Single Family Home                                       Social Determinants of Health (SDOH) Interventions    Readmission Risk Interventions No flowsheet data found.

## 2018-12-16 ENCOUNTER — Telehealth: Payer: Self-pay | Admitting: Internal Medicine

## 2018-12-16 ENCOUNTER — Telehealth: Payer: Self-pay | Admitting: Family Medicine

## 2018-12-16 LAB — CBC
HCT: 28.5 % — ABNORMAL LOW (ref 39.0–52.0)
Hemoglobin: 9.4 g/dL — ABNORMAL LOW (ref 13.0–17.0)
MCH: 33.1 pg (ref 26.0–34.0)
MCHC: 33 g/dL (ref 30.0–36.0)
MCV: 100.4 fL — ABNORMAL HIGH (ref 80.0–100.0)
Platelets: 154 10*3/uL (ref 150–400)
RBC: 2.84 MIL/uL — ABNORMAL LOW (ref 4.22–5.81)
RDW: 13.4 % (ref 11.5–15.5)
WBC: 6 10*3/uL (ref 4.0–10.5)
nRBC: 0 % (ref 0.0–0.2)

## 2018-12-16 MED ORDER — FERROUS SULFATE 325 (65 FE) MG PO TABS: 325.0000 mg | ORAL_TABLET | Freq: Two times a day (BID) | ORAL | 0 refills | Status: DC

## 2018-12-16 MED ORDER — APIXABAN 5 MG PO TABS: 5.0000 mg | ORAL_TABLET | Freq: Two times a day (BID) | ORAL | Status: DC

## 2018-12-16 MED ORDER — ACETAMINOPHEN 325 MG PO TABS: 650.0000 mg | ORAL_TABLET | Freq: Four times a day (QID) | ORAL | Status: DC | PRN

## 2018-12-16 NOTE — Progress Notes (Signed)
[name] to be D/C'd  per MD order. Discussed with the patient and all questions fully answered.  VSS, Skin clean, dry and intact without evidence of skin break down, no evidence of skin tears noted.  IV catheter discontinued intact. Site without signs and symptoms of complications. Dressing and pressure applied.  An After Visit Summary was printed and given to the patient. Patient received prescription.  D/c education completed with patient/family including follow up instructions, medication list, d/c activities limitations if indicated, with other d/c instructions as indicated by MD - patient able to verbalize understanding, all questions fully answered.   Patient instructed to return to ED, call 911, or call MD for any changes in condition.   Patient to be escorted via [place], and D/C home via private auto.

## 2018-12-16 NOTE — Telephone Encounter (Signed)
New Message    Pt c/o medication issue:  1. Name of Medication: Eliquis   2. How are you currently taking this medication (dosage and times per day)? 5mg  1 tablet 2 times daily  3. Are you having a reaction (difficulty breathing--STAT)? NO  4. What is your medication issue? Patient was in the hospital for rectal bleeding and is discharged now and wants to know if they should continue Eliquis.

## 2018-12-16 NOTE — Progress Notes (Signed)
Physical Therapy Treatment Patient Details Name: Brad [name] MRN: [id number] DOB: 1928/03/21 Today's Date: 12/16/2018    History of Present Illness Pt is an 85 y/o male admitted secondary to GI bleed. PMH includes HTN, a fib, dementia, s/p pacemaker, s/p AAA repair, CAD s/p stent, and COPD.    PT Comments    Pt progressing well with mobiltiy. Initiated gait training with SPC, demonstrating mild instability with min guard for balance. DOE 3/4 with short ambulation distance, SpO2 down to 86% on RA and HR 135; slow to recover with seated rest. Pt reports using SPC outside and RW indoors. Increased time educating regarding fall risk reduction and safety recommendations (pt enjoys fly fishing but recently had fall in the water). Pt preparing for d/c home this morning with support from sons. If to remain admitted, will follow acutely.   Follow Up Recommendations  Home health PT;Supervision/Assistance - 24 hour     Equipment Recommendations  None recommended by PT    Recommendations for Other Services       Precautions / Restrictions Precautions Precautions: Fall Restrictions Weight Bearing Restrictions: No    Mobility  Bed Mobility               General bed mobility comments: In chair upon entry   Transfers Overall transfer level: Needs assistance Equipment used: Straight cane Transfers: Sit to/from Stand Sit to Stand: Min guard         General transfer comment: 2x sit<>stand from recliner with SPC and min guard for balance; reliant on momentum and UE support to power up  Ambulation/Gait Ambulation/Gait assistance: Min guard Gait Distance (Feet): 50 Feet Assistive device: Straight cane Gait Pattern/deviations: Step-through pattern;Decreased stride length;Trunk flexed Gait velocity: Decreased  Gait velocity interpretation: <1.31 ft/sec, indicative of household ambulator General Gait Details: Slow, mildly unsteady gait with SPC and min guard for balance; 1x  standing rest break due to DOE, but pt also conversing during walk. SpO2 down to 86% on RA and HR up to 135, slowly recovering with seated rest. Pt reports using RW in house and SPC outside   Stairs Stairs: (Unable due to pt d/c)           Wheelchair Mobility    Modified Rankin (Stroke Patients Only)       Balance Overall balance assessment: Needs assistance Sitting-balance support: No upper extremity supported;Feet supported Sitting balance-[name] Scale: Good     Standing balance support: Bilateral upper extremity supported;During functional activity Standing balance-[name] Scale: Poor Standing balance comment: Reliant on at least single UE support                            Cognition Arousal/Alertness: Awake/alert Behavior During Therapy: WFL for tasks assessed/performed Overall Cognitive Status: History of cognitive impairments - at baseline                                 General Comments: Dementia at baseline. Carrying conversation and following simple commands appropriately; [facility]      Exercises      General Comments General comments (skin integrity, edema, etc.): Pt preparing for d/c with support from sons. 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 Pt seems to have good insight into current deficits and wants to be safe      Pertinent Vitals/Pain Pain Assessment: No/denies pain    Home Living                      Prior Function            PT Goals (current goals can now be found in the care plan section) Acute Rehab PT Goals Patient Stated Goal: Home today PT Goal Formulation: With patient Time For Goal Achievement: 12/29/18 Potential to Achieve Goals: Good Progress towards PT goals:  Progressing toward goals    Frequency    Min 3X/week      PT Plan Current plan remains appropriate    Co-evaluation              AM-PAC PT "6 Clicks" Mobility   Outcome Measure  Help needed turning from your back to your side while in a flat bed without using bedrails?: A Little Help needed moving from lying on your back to sitting on the side of a flat bed without using bedrails?: A Little Help needed moving to and from a bed to a chair (including a wheelchair)?: A Little Help needed standing up from a chair using your arms (e.g., wheelchair or bedside chair)?: A Little Help needed to walk in hospital room?: A Little Help needed climbing 3-5 steps with a railing? : A Little 6 Click Score: 18    End of Session   Activity Tolerance: Patient tolerated treatment well Patient left: (in transport chair for d/c) Nurse Communication: Mobility status PT Visit Diagnosis: Unsteadiness on feet (R26.81);Muscle weakness (generalized) (M62.81)     Time: 8016-5537 PT Time Calculation (min) (ACUTE ONLY): 13 min  Charges:  $Gait Training: 8-22 mins                    Mabeline Caras, PT, DPT Acute Rehabilitation Services  Pager [phone] Office [place] 12/16/2018, 11:30 AM

## 2018-12-16 NOTE — Discharge Summary (Signed)
Physician Discharge Summary  Brad [name] SEG:[id number] DOB: [birth year] DOA: 12/13/2018  PCP: [name], MD  Admit date: 12/13/2018 Discharge date: 12/16/2018  Time spent: 34minutes  Recommendations for Outpatient Follow-up:  1. PCP Dr. [name] in 1 week, check CBC in 1 week and start iron 2. Restart Eliquis on Saturday 8/1   Discharge Diagnoses:  Principal Problem:   Lower GI bleeding   Presumed diverticular bleed   Acute blood loss anemia   Essential hypertension   Hyperlipemia   Atrial fibrillation (HCC)   Dementia due to Alzheimer's disease [facility])   Discharge Condition: Stable  Diet recommendation: Heart healthy  Filed Weights   12/13/18 0850 12/13/18 1144  Weight: 83.1 kg 82.4 kg    History of present illness:  Brad [name] is a 85 y.o. malewith medical history significant ofpacemaker placement; s/p AAA repair; PAF on Eliquis; macular degeneration; HTN; HLD; CAD s/p stent; and COPD. [name] with hematochezia  Hospital Course:   Suspected diverticular bleed/hematochezia -History of last colonoscopy in 2009 noted diffuse diverticulosis, patient admitted with painless lower GI bleeding which resolved, no further bleeding for over 30 hours -After initial mild drop in hemoglobin and subsequently hemoglobin has remained stable, he was seen by [place] [facility] this admission, his Eliquis was held initially and can be restarted on Saturday 8/1 -Follow-up with PCP in 1 week, recheck CBC then  Acute blood loss anemia -Secondary to diverticular bleeding, now resolved, hemoglobin now stabilized, 9.5 at discharge, advised to start oral iron therapy in 1 week  Essential hypertension On metoprolol and Imdur as an outpatient -Continue metoprolol and Imdur  Atrial fibrillation Currently rate controlled. On Eliquis as an outpatient. CHA2DS2-VASc Scoreis 4, high risk, and stroke risk is about 4.8%. -Held Eliquis initially,  bleeding has resolved, advised to restart Eliquis this weekend on Saturday 8/1 -Continue metoprolol  Dementia Patient is on Namenda and Aricept -Continue Namenda and Aricept  Hyperlipidemia -Continue Crestor  Aortic aneurysm Stable on CT angio  Consultations:  [facility]  Discharge Exam: Vitals:   12/15/18 2011 12/16/18 0602  BP: 92/62 120/80  Pulse: 87 74  Resp: 18 18  Temp: 97.8 F (36.6 C) 98.6 F (37 C)  SpO2: 97% 97%    General: Alert awake oriented to self and place only, cognitive deficits Cardiovascular: S1-S2/regular rate rhythm Respiratory: Clear bilaterally  Discharge Instructions   Discharge Instructions    Diet - low sodium heart healthy   Complete by: As directed    Increase activity slowly   Complete by: As directed      Allergies as of 12/16/2018      Reactions   Aspirin Shortness Of Breath, Swelling   Atorvastatin Other (See Comments)   Leg pain, feet pain   Cephalexin Other (See Comments)   unknown      Medication List    STOP taking these medications   celecoxib 400 MG capsule Commonly known as: CELEBREX   furosemide 20 MG tablet Commonly known as: LASIX     TAKE these medications   apixaban 5 MG Tabs tablet Commonly known as: Eliquis Take 1 tablet (5 mg total) by mouth 2 (two) times daily. Start taking on: December 19, 2018 What changed:   how much to take  These instructions start on December 19, 2018. If you are unsure what to do until then, ask your doctor or other care provider.   b complex vitamins tablet Take 1 tablet by mouth daily.   Butenafine  HCl 1 % cream Apply twice daily to affected areas What changed:   how much to take  how to take this  when to take this   donepezil 10 MG tablet Commonly known as: ARICEPT TAKE 1 TABLET BY MOUTH EVERY DAY What changed:   when to take this  additional instructions   ferrous sulfate 325 (65 FE) MG tablet Commonly known as: FerrouSul Take 1 tablet (325 mg  total) by mouth 2 (two) times daily with a meal.   fexofenadine 180 MG tablet Commonly known as: ALLEGRA TAKE 1 TABLET (180 MG TOTAL) BY MOUTH DAILY. FOR ALLERGY SYMPTOMS   finasteride 5 MG tablet Commonly known as: PROSCAR TAKE 1 TABLET BY MOUTH EVERY DAY   fish oil-omega-3 fatty acids 1000 MG capsule Take 1 g by mouth at bedtime.   fluticasone 50 MCG/ACT nasal spray Commonly known as: FLONASE SPRAY 2 SPRAYS INTO EACH NOSTRIL EVERY DAY What changed: See the new instructions.   isosorbide mononitrate 30 MG 24 hr tablet Commonly known as: IMDUR TAKE 1 TABLET BY MOUTH EVERY DAY   Klor-Con M20 20 MEQ tablet Generic drug: potassium chloride SA TAKE 1 TABLET BY MOUTH EVERY DAY What changed: how much to take   memantine 28 MG Cp24 24 hr capsule Commonly known as: NAMENDA XR Take 1 capsule (28 mg total) by mouth daily. What changed: when to take this   metoprolol tartrate 25 MG tablet Commonly known as: LOPRESSOR Take 1 tablet (25 mg total) by mouth 2 (two) times daily.   multivitamin with minerals Tabs tablet Take 1 tablet by mouth every evening. PRESER VISION   rosuvastatin 5 MG tablet Commonly known as: CRESTOR Take 1 tablet (5 mg total) by mouth daily. What changed: when to take this   tamsulosin 0.4 MG Caps capsule Commonly known as: FLOMAX Take 2 capsules (0.8 mg total) by mouth at bedtime.   TURMERIC CURCUMIN PO Take 1 tablet by mouth at bedtime.   Tylenol Arthritis Pain 650 MG CR tablet Generic drug: acetaminophen Take 650 mg by mouth 2 (two) times a day. What changed: Another medication with the same name was added. Make sure you understand how and when to take each.   acetaminophen 325 MG tablet Commonly known as: TYLENOL Take 2 tablets (650 mg total) by mouth every 6 (six) hours as needed for mild pain (or Fever >/= 101). What changed: You were already taking a medication with the same name, and this prescription was added. Make sure you understand how  and when to take each.      Allergies  Allergen Reactions  . Aspirin Shortness Of Breath and Swelling  . Atorvastatin Other (See Comments)    Leg pain, feet pain  . Cephalexin Other (See Comments)    unknown   Follow-up Information    Stacks, Cletus Gash, MD. Schedule an appointment as soon as possible for a visit in 1 week(s).   Specialty: Family Medicine Contact information: [place] [address] [address] .   Contact information: [place] [address] [phone]            The results of significant diagnostics from this hospitalization (including imaging, microbiology, ancillary and laboratory) are listed below for reference.    Significant Diagnostic Studies: No results found.  Microbiology: Recent Results (from the past 240 hour(s))  SARS Coronavirus 2 (CEPHEID - Performed in [place] hospital lab), [facility]  Status: None   Collection Time: 12/13/18  9:10 AM   Specimen: Nasopharyngeal Swab  Result Value Ref Range Status   SARS Coronavirus 2 NEGATIVE NEGATIVE Final    Comment: (NOTE) If result is NEGATIVE SARS-CoV-2 target nucleic acids are NOT DETECTED. The SARS-CoV-2 RNA is generally detectable in upper and lower  respiratory specimens during the acute phase of infection. The lowest  concentration of SARS-CoV-2 viral copies this assay can detect is 250  copies / mL. A negative result does not preclude SARS-CoV-2 infection  and should not be used as the sole basis for treatment or other  patient management decisions.  A negative result may occur with  improper specimen collection / handling, submission of specimen other  than nasopharyngeal swab, presence of viral mutation(s) within the  areas targeted by this assay, and inadequate number of viral copies  (<250 copies / mL). A negative result must be combined with clinical  observations, patient history, and epidemiological information. If result is  POSITIVE SARS-CoV-2 target nucleic acids are DETECTED. The SARS-CoV-2 RNA is generally detectable in upper and lower  respiratory specimens dur ing the acute phase of infection.  Positive  results are indicative of active infection with SARS-CoV-2.  Clinical  correlation with patient history and other diagnostic information is  necessary to determine patient infection status.  Positive results do  not rule out bacterial infection or co-infection with other viruses. If result is PRESUMPTIVE POSTIVE SARS-CoV-2 nucleic acids MAY BE PRESENT.   A presumptive positive result was obtained on the submitted specimen  and confirmed on repeat testing.  While 2019 novel coronavirus  (SARS-CoV-2) nucleic acids may be present in the submitted sample  additional confirmatory testing may be necessary for epidemiological  and / or clinical management purposes  to differentiate between  SARS-CoV-2 and other Sarbecovirus currently known to infect humans.  If clinically indicated additional testing with an alternate test  methodology [phone]) is advised. The SARS-CoV-2 RNA is generally  detectable in upper and lower respiratory sp ecimens during the acute  phase of infection. The expected result is Negative. Fact Sheet for Patients:  StrictlyIdeas.no Fact Sheet for Healthcare Providers: BankingDealers.co.za This test is not yet approved or cleared by the [place] FDA and has been authorized for detection and/or diagnosis of SARS-CoV-2 by FDA under an Emergency Use Authorization (EUA).  This EUA will remain in effect (meaning this test can be used) for the duration of the COVID-19 declaration under Section 564(b)(1) of the Act, 21 U.S.C. section 360bbb-3(b)(1), unless the authorization is terminated or revoked sooner. Performed at [facility], [place] [address], [place], [place] [address]      Labs: Basic Metabolic Panel: Recent Labs  Lab  12/13/18 0903 12/14/18 0731  NA 144 141  K 4.1 3.9  CL 110 107  CO2 26 26  GLUCOSE 126* 109*  BUN 21 15  CREATININE 1.03 0.68  CALCIUM 9.1 8.8*   Liver Function Tests: Recent Labs  Lab 12/13/18 0903  AST 19  ALT 16  ALKPHOS 42  BILITOT 0.9  PROT 6.2*  ALBUMIN 3.4*   No results for input(s): LIPASE, AMYLASE in the last 168 hours. No results for input(s): AMMONIA in the last 168 hours. CBC: Recent Labs  Lab 12/13/18 0903 12/13/18 1707 12/14/18 0731 12/14/18 1638 12/15/18 0629 12/16/18 0147  WBC 5.9 5.2 5.5 5.9 5.8 6.0  NEUTROABS 3.9  --   --   --   --   --   HGB  11.2* 10.4* 9.9* 10.5* 9.6* 9.4*  HCT 35.6* 31.7* 30.5* 31.8*  30.5* 29.4* 28.5*  MCV 103.5* 102.3* 103.0* 101.0* 101.7* 100.4*  PLT 166 153 158 161 157 154   Cardiac Enzymes: No results for input(s): CKTOTAL, CKMB, CKMBINDEX, TROPONINI in the last 168 hours. BNP: BNP (last 3 results) No results for input(s): BNP in the last 8760 hours.  ProBNP (last 3 results) No results for input(s): PROBNP in the last 8760 hours.  CBG: No results for input(s): GLUCAP in the last 168 hours.     Signed:  Domenic Polite MD.  [facility] 12/16/2018, 10:52 AM

## 2018-12-16 NOTE — Telephone Encounter (Signed)
Explained dosing to son

## 2018-12-16 NOTE — TOC Progression Note (Signed)
Transition of Care [facility]) - Progression Note    Patient Details  Name: Brad [name] MRN: [id number] Date of Birth: [birth year]  [facility] of [facility] CM/SW Contact  Lincon Sahlin, Edson Snowball, RN Phone Number: 12/16/2018, 10:16 AM  Clinical Narrative:     Patient and son chose [facility] home health , referral given to and accepted by Sharmon Revere   Expected Discharge Plan: [place] Barriers to Discharge: Continued Medical Work up  Expected Discharge Plan and Services Expected Discharge Plan: [place] arrangements for the past 2 months: Single Family Home Expected Discharge Date: 12/16/18                                     Social Determinants of Health (SDOH) Interventions    Readmission Risk Interventions No flowsheet data found.

## 2018-12-16 NOTE — Telephone Encounter (Signed)
Spoke with Pt's son.  Confirmed with Pt son that Pt should restart Eliquis on 8/1.  Pt has f/u with PCP in August.   F/u with Dr. Lovena Le in September.  No further needs at this time.

## 2018-12-18 DIAGNOSIS — G309 Alzheimer's disease, unspecified: Secondary | ICD-10-CM | POA: Diagnosis not present

## 2018-12-18 DIAGNOSIS — I10 Essential (primary) hypertension: Secondary | ICD-10-CM | POA: Diagnosis not present

## 2018-12-18 DIAGNOSIS — F0280 Dementia in other diseases classified elsewhere without behavioral disturbance: Secondary | ICD-10-CM | POA: Diagnosis not present

## 2018-12-18 DIAGNOSIS — I2510 Atherosclerotic heart disease of native coronary artery without angina pectoris: Secondary | ICD-10-CM | POA: Diagnosis not present

## 2018-12-18 DIAGNOSIS — K5790 Diverticulosis of intestine, part unspecified, without perforation or abscess without bleeding: Secondary | ICD-10-CM | POA: Diagnosis not present

## 2018-12-18 DIAGNOSIS — I480 Paroxysmal atrial fibrillation: Secondary | ICD-10-CM | POA: Diagnosis not present

## 2018-12-18 DIAGNOSIS — J449 Chronic obstructive pulmonary disease, unspecified: Secondary | ICD-10-CM | POA: Diagnosis not present

## 2018-12-18 DIAGNOSIS — E785 Hyperlipidemia, unspecified: Secondary | ICD-10-CM | POA: Diagnosis not present

## 2018-12-18 DIAGNOSIS — D649 Anemia, unspecified: Secondary | ICD-10-CM | POA: Diagnosis not present

## 2018-12-18 DIAGNOSIS — Z9181 History of falling: Secondary | ICD-10-CM | POA: Diagnosis not present

## 2018-12-23 ENCOUNTER — Telehealth: Payer: Self-pay | Admitting: Family Medicine

## 2018-12-23 DIAGNOSIS — I2510 Atherosclerotic heart disease of native coronary artery without angina pectoris: Secondary | ICD-10-CM | POA: Diagnosis not present

## 2018-12-23 DIAGNOSIS — K5790 Diverticulosis of intestine, part unspecified, without perforation or abscess without bleeding: Secondary | ICD-10-CM | POA: Diagnosis not present

## 2018-12-23 DIAGNOSIS — G309 Alzheimer's disease, unspecified: Secondary | ICD-10-CM | POA: Diagnosis not present

## 2018-12-23 DIAGNOSIS — I10 Essential (primary) hypertension: Secondary | ICD-10-CM | POA: Diagnosis not present

## 2018-12-23 DIAGNOSIS — I480 Paroxysmal atrial fibrillation: Secondary | ICD-10-CM | POA: Diagnosis not present

## 2018-12-23 DIAGNOSIS — F0280 Dementia in other diseases classified elsewhere without behavioral disturbance: Secondary | ICD-10-CM | POA: Diagnosis not present

## 2018-12-23 NOTE — Telephone Encounter (Signed)
He was just in hospital with a GI bleed

## 2018-12-23 NOTE — Telephone Encounter (Signed)
Please have them use miralax. One capful daily to BID until BMs normalize.

## 2018-12-24 ENCOUNTER — Other Ambulatory Visit: Payer: Self-pay | Admitting: Family Medicine

## 2018-12-24 NOTE — Telephone Encounter (Signed)
Son aware.

## 2018-12-25 DIAGNOSIS — I480 Paroxysmal atrial fibrillation: Secondary | ICD-10-CM | POA: Diagnosis not present

## 2018-12-25 DIAGNOSIS — I10 Essential (primary) hypertension: Secondary | ICD-10-CM | POA: Diagnosis not present

## 2018-12-25 DIAGNOSIS — K5790 Diverticulosis of intestine, part unspecified, without perforation or abscess without bleeding: Secondary | ICD-10-CM | POA: Diagnosis not present

## 2018-12-25 DIAGNOSIS — I2510 Atherosclerotic heart disease of native coronary artery without angina pectoris: Secondary | ICD-10-CM | POA: Diagnosis not present

## 2018-12-25 DIAGNOSIS — F0280 Dementia in other diseases classified elsewhere without behavioral disturbance: Secondary | ICD-10-CM | POA: Diagnosis not present

## 2018-12-25 DIAGNOSIS — G309 Alzheimer's disease, unspecified: Secondary | ICD-10-CM | POA: Diagnosis not present

## 2018-12-27 ENCOUNTER — Other Ambulatory Visit: Payer: Self-pay

## 2018-12-27 ENCOUNTER — Emergency Department (HOSPITAL_COMMUNITY): Payer: Medicare Other

## 2018-12-27 ENCOUNTER — Emergency Department (HOSPITAL_COMMUNITY)
Admission: EM | Admit: 2018-12-27 | Discharge: 2018-12-27 | Disposition: A | Discharge status: Home / Self Care | Payer: Medicare Other | Attending: Emergency Medicine | Admitting: Emergency Medicine

## 2018-12-27 ENCOUNTER — Encounter (HOSPITAL_COMMUNITY): Payer: Self-pay | Admitting: Emergency Medicine

## 2018-12-27 ENCOUNTER — Other Ambulatory Visit: Payer: Self-pay | Admitting: Family Medicine

## 2018-12-27 DIAGNOSIS — Z87891 Personal history of nicotine dependence: Secondary | ICD-10-CM | POA: Diagnosis not present

## 2018-12-27 DIAGNOSIS — Z7901 Long term (current) use of anticoagulants: Secondary | ICD-10-CM | POA: Diagnosis not present

## 2018-12-27 DIAGNOSIS — S0990XA Unspecified injury of head, initial encounter: Secondary | ICD-10-CM | POA: Diagnosis not present

## 2018-12-27 DIAGNOSIS — K5730 Diverticulosis of large intestine without perforation or abscess without bleeding: Secondary | ICD-10-CM | POA: Diagnosis not present

## 2018-12-27 DIAGNOSIS — Z79899 Other long term (current) drug therapy: Secondary | ICD-10-CM | POA: Diagnosis not present

## 2018-12-27 DIAGNOSIS — G309 Alzheimer's disease, unspecified: Secondary | ICD-10-CM | POA: Insufficient documentation

## 2018-12-27 DIAGNOSIS — I2510 Atherosclerotic heart disease of native coronary artery without angina pectoris: Secondary | ICD-10-CM | POA: Insufficient documentation

## 2018-12-27 DIAGNOSIS — Z950 Presence of cardiac pacemaker: Secondary | ICD-10-CM | POA: Diagnosis not present

## 2018-12-27 DIAGNOSIS — I10 Essential (primary) hypertension: Secondary | ICD-10-CM | POA: Diagnosis not present

## 2018-12-27 DIAGNOSIS — K5900 Constipation, unspecified: Secondary | ICD-10-CM | POA: Diagnosis not present

## 2018-12-27 DIAGNOSIS — J449 Chronic obstructive pulmonary disease, unspecified: Secondary | ICD-10-CM | POA: Diagnosis not present

## 2018-12-27 DIAGNOSIS — F0280 Dementia in other diseases classified elsewhere without behavioral disturbance: Secondary | ICD-10-CM | POA: Insufficient documentation

## 2018-12-27 LAB — CBC
HCT: 28.1 % — ABNORMAL LOW (ref 39.0–52.0)
Hemoglobin: 9 g/dL — ABNORMAL LOW (ref 13.0–17.0)
MCH: 33.1 pg (ref 26.0–34.0)
MCHC: 32 g/dL (ref 30.0–36.0)
MCV: 103.3 fL — ABNORMAL HIGH (ref 80.0–100.0)
Platelets: 198 10*3/uL (ref 150–400)
RBC: 2.72 MIL/uL — ABNORMAL LOW (ref 4.22–5.81)
RDW: 13.8 % (ref 11.5–15.5)
WBC: 5.8 10*3/uL (ref 4.0–10.5)
nRBC: 0 % (ref 0.0–0.2)

## 2018-12-27 LAB — LIPASE, BLOOD: Lipase: 30 U/L (ref 11–51)

## 2018-12-27 LAB — URINALYSIS, ROUTINE W REFLEX MICROSCOPIC
Bilirubin Urine: NEGATIVE
Glucose, UA: NEGATIVE mg/dL
Hgb urine dipstick: NEGATIVE
Ketones, ur: NEGATIVE mg/dL
Leukocytes,Ua: NEGATIVE
Nitrite: NEGATIVE
Protein, ur: NEGATIVE mg/dL
Specific Gravity, Urine: 1.026 (ref 1.005–1.030)
pH: 6 (ref 5.0–8.0)

## 2018-12-27 LAB — COMPREHENSIVE METABOLIC PANEL
ALT: 15 U/L (ref 0–44)
AST: 17 U/L (ref 15–41)
Albumin: 3.2 g/dL — ABNORMAL LOW (ref 3.5–5.0)
Alkaline Phosphatase: 43 U/L (ref 38–126)
Anion gap: 7 (ref 5–15)
BUN: 14 mg/dL (ref 8–23)
CO2: 26 mmol/L (ref 22–32)
Calcium: 8.9 mg/dL (ref 8.9–10.3)
Chloride: 109 mmol/L (ref 98–111)
Creatinine, Ser: 0.9 mg/dL (ref 0.61–1.24)
GFR calc Af Amer: >60 mL/min (ref >60)
GFR calc non Af Amer: >60 mL/min (ref >60)
Glucose, Bld: 100 mg/dL — ABNORMAL HIGH (ref 70–99)
Potassium: 4.8 mmol/L (ref 3.5–5.1)
Sodium: 142 mmol/L (ref 135–145)
Total Bilirubin: 0.5 mg/dL (ref 0.3–1.2)
Total Protein: 5.9 g/dL — ABNORMAL LOW (ref 6.5–8.1)

## 2018-12-27 LAB — POC OCCULT BLOOD, ED: Fecal Occult Bld: POSITIVE — AB

## 2018-12-27 MED ORDER — SODIUM CHLORIDE 0.9% FLUSH
3.0000 mL | Freq: Once | INTRAVENOUS | Status: AC
  Administered 2018-12-27: 12:00:00 3 mL via INTRAVENOUS

## 2018-12-27 MED ORDER — POLYETHYLENE GLYCOL 3350 17 G PO PACK: 17.0000 g | PACK | Freq: Every day | ORAL | 0 refills | Status: DC

## 2018-12-27 MED ORDER — BISACODYL 10 MG RE SUPP: 10.0000 mg | RECTAL | 0 refills | Status: DC | PRN

## 2018-12-27 MED ORDER — METAMUCIL SMOOTH TEXTURE 58.6 % PO POWD: 1.0000 | Freq: Three times a day (TID) | ORAL | 0 refills | Status: AC

## 2018-12-27 MED ORDER — IOHEXOL 300 MG/ML  SOLN
100.0000 mL | Freq: Once | INTRAMUSCULAR | Status: AC | PRN
  Administered 2018-12-27: 100 mL via INTRAVENOUS

## 2018-12-27 MED ORDER — DOCUSATE SODIUM 100 MG PO CAPS: 100.0000 mg | ORAL_CAPSULE | Freq: Two times a day (BID) | ORAL | 0 refills | Status: DC

## 2018-12-27 NOTE — ED Provider Notes (Signed)
[facility] EMERGENCY DEPARTMENT Provider Note   CSN: [id number] Arrival date & time: 12/27/18  1059    History   Chief Complaint Chief Complaint  Patient presents with   Constipation    HPI Brad [name] is a 85 y.o. male with history of CAD, paroxysmal atrial fibrillation anticoagulated on Eliquis, COPD, chronic low back pain, Alzheimer's dementia who presents with constipation and abdominal pain for the past few days.  He has been having black tarry stools, however is not passing any flatus.  He is not really having any significant bowel movements.  Patient has 4 abdominal hernias and wears a binder, which he tried taking off to help with his constipation.  Patient also had a fall last week on 12/22/2018.  He does not remember this happening.  He does not know if he hit his head or loss consciousness.  He denies any injuries, however he has been having problems with constipation since.  Patient was recently admitted for diverticular bleed.  Eliquis was stopped temporarily, however then restarted.  Patient denies any chest pain, shortness of breath, fever, cough, nausea, vomiting.     HPI  Past Medical History:  Diagnosis Date   CAD (coronary artery disease)    s/p stent to OM2 in past;  Last LHC 3/05: EF 60%, left RA occluded, right RA ok, prox to mid LAD 60%, oD1 75-80%, oD2 small 75%, pOM1 50%, OM2 stent ok with 50% before stent, mRCA 30%, dRCA 30%, PDA 70%.  Last dobutamine myoview 5/06: no scar or ischemia, EF 60%.   Chronic lower back pain    COPD (chronic obstructive pulmonary disease) (HCC)    DJD (degenerative joint disease)    Enlarged prostate    Ethanolism (HCC)    GERD (gastroesophageal reflux disease)    Hernia, abdominal    "he has ~ 4; wears binder" (11/20/2015)   HLD (hyperlipidemia)    HTN (hypertension)    Last echo 1/07:  Normal LVF, LV thickness upper limits of normal, mild aortic root dilatation, mild to mod MAC, mild LAE,  borderline RVH, mild RAE.   Macular degeneration    Pacemaker    DR Radford Pax; a. s/p MDT dual chamber PPM with His Bundle pacing   PAD (peripheral artery disease) (HCC)    Paroxysmal atrial fibrillation (HCC)    Psoriasis    S/P AAA repair    Dr. Roxan Hockey   Scoliosis    Tobacco abuse    Unilateral congenital absence of kidney     Patient Active Problem List   Diagnosis Date Noted   Lower GI bleeding 12/13/2018   Dementia due to Alzheimer's disease ([place]) 10/02/2018   Blood in urine 12/04/2016   Mild dementia ([place]) 07/03/2016   Atrial fibrillation ([place]) 11/21/2015   Sinus node dysfunction ([place]) 11/20/2015   BPH (benign prostatic hyperplasia) 07/07/2015   Hyperlipemia 07/07/2015   Ventral hernia 05/31/2013   Carpal tunnel syndrome, bilateral 05/07/2011   Ethanolism ([place])    COPD (chronic obstructive pulmonary disease) ([place])    ADENOMATOUS COLONIC POLYP 04/18/2008   Essential hypertension 04/18/2008   Coronary atherosclerosis 04/18/2008   AAA 04/18/2008   PVD 04/18/2008   GERD 04/18/2008   DIVERTICULOSIS, COLON 04/18/2008    Past Surgical History:  Procedure Laterality Date   APPENDECTOMY     CARPAL TUNNEL RELEASE Right 12/26/2014   Procedure: RIGHT ENDOSCOPIC CARPAL TUNNEL SYNDROME RELEASE;  Surgeon: Milly Jakob, MD;  Location: [facility];  Service: Orthopedics;  Laterality:  Right;   CATARACT EXTRACTION, BILATERAL Bilateral    CORONARY ANGIOPLASTY WITH STENT PLACEMENT     s/p stent to OM2 in past;    EP IMPLANTABLE DEVICE N/A 11/20/2015   Procedure: Pacemaker Implant;  Surgeon: Evans Lance, MD;  Location: [facility];  Service: Cardiovascular;  Laterality: N/A;   HERNIA REPAIR     INSERT / REPLACE / REMOVE PACEMAKER  11/20/2015   RESECTION AND GRAFTING OF INFARENAL  ABDOMINAL  AORTIC ANEURYSM  05/03/2002        Home Medications    Prior to Admission medications   Medication Sig Start Date End Date  Taking? Authorizing Provider  acetaminophen (TYLENOL ARTHRITIS PAIN) 650 MG CR tablet Take 650 mg by mouth 2 (two) times a day.    Yes [provider]  acetaminophen (TYLENOL) 325 MG tablet Take 2 tablets (650 mg total) by mouth every 6 (six) hours as needed for mild pain (or Fever >/= 101). 12/16/18  Yes Domenic Polite, MD  apixaban (ELIQUIS) 5 MG TABS tablet Take 1 tablet (5 mg total) by mouth 2 (two) times daily. 12/19/18  Yes Domenic Polite, MD  b complex vitamins tablet Take 1 tablet by mouth daily.   Yes [provider]  Butenafine HCl 1 % cream Apply twice daily to affected areas Patient taking differently: Apply 1 application topically 2 (two) times daily. Apply twice daily to affected areas 11/26/18  Yes Stacks, Cletus Gash, MD  donepezil (ARICEPT) 10 MG tablet TAKE 1 TABLET BY MOUTH EVERY DAY Patient taking differently: Take 10 mg by mouth every morning. TAKE 1 TABLET BY MOUTH EVERY DAY 10/27/18  Yes Cameron Sprang, MD  ferrous sulfate (FERROUSUL) 325 (65 FE) MG tablet Take 1 tablet (325 mg total) by mouth 2 (two) times daily with a meal. 12/16/18  Yes Domenic Polite, MD  fexofenadine (ALLEGRA) 180 MG tablet TAKE 1 TABLET (180 MG TOTAL) BY MOUTH DAILY. FOR ALLERGY SYMPTOMS 09/04/18  Yes Stacks, Cletus Gash, MD  finasteride (PROSCAR) 5 MG tablet TAKE 1 TABLET BY MOUTH EVERY DAY Patient taking differently: Take 5 mg by mouth daily.  12/15/18  Yes [name], MD  fish oil-omega-3 fatty acids 1000 MG capsule Take 1 g by mouth at bedtime.    Yes [provider]  fluticasone (FLONASE) 50 MCG/ACT nasal spray SPRAY 2 SPRAYS INTO EACH NOSTRIL EVERY DAY Patient taking differently: Place 1 spray into both nostrils daily as needed for allergies.  11/10/17  Yes Stacks, Cletus Gash, MD  isosorbide mononitrate (IMDUR) 30 MG 24 hr tablet TAKE 1 TABLET BY MOUTH EVERY DAY Patient taking differently: Take 30 mg by mouth daily.  12/24/18  Yes Stacks, Cletus Gash, MD  KLOR-CON M20 20 MEQ tablet TAKE 1  TABLET BY MOUTH EVERY DAY Patient taking differently: Take 20 mEq by mouth daily.  11/30/18  Yes [name], MD  memantine (NAMENDA XR) 28 MG CP24 24 hr capsule Take 1 capsule (28 mg total) by mouth daily. Patient taking differently: Take 28 mg by mouth at bedtime.  07/20/18  Yes [name], MD  metoprolol tartrate (LOPRESSOR) 25 MG tablet Take 1 tablet (25 mg total) by mouth 2 (two) times daily. 04/21/18  Yes StacksCletus Gash, MD  Multiple Vitamin (MULTIVITAMIN WITH MINERALS) TABS tablet Take 1 tablet by mouth every evening. PRESER VISION    Yes [provider]  rosuvastatin (CRESTOR) 5 MG tablet Take 1 tablet (5 mg total) by mouth daily. Patient taking differently: Take 5 mg by mouth at bedtime.  04/21/18  Yes [name], MD  tamsulosin (FLOMAX) 0.4 MG CAPS capsule Take 2 capsules (0.8 mg total) by mouth at bedtime. 04/21/18  Yes Stacks, Cletus Gash, MD  TURMERIC CURCUMIN PO Take 1 tablet by mouth at bedtime.    Yes [provider]  bisacodyl (DULCOLAX) 10 MG suppository Place 1 suppository (10 mg total) rectally as needed for moderate constipation. 12/27/18   Mackinzee Roszak, Bea Graff, PA-C  docusate sodium (COLACE) 100 MG capsule Take 1 capsule (100 mg total) by mouth every 12 (twelve) hours. 12/27/18   Yadira Hada, Bea Graff, PA-C  polyethylene glycol (MIRALAX) 17 g packet Take 17 g by mouth daily. 12/27/18   Jawanda Passey, Bea Graff, PA-C  psyllium (METAMUCIL SMOOTH TEXTURE) 58.6 % powder Take 1 packet by mouth 3 (three) times daily. 12/27/18   Frederica Kuster, PA-C    Family History Family History  Problem Relation Age of Onset   Cancer Mother        uterine   Stroke Mother        hemi plagic   Cancer Sister    COPD Sister    Early death Brother    Cancer Brother        Lip   Stroke Father    AAA (abdominal aortic aneurysm) Son    Arthritis Son        knee   COPD Brother    Cancer Son        testicular    Hypertension Son    Early death Sister     Social History Social  History   Tobacco Use   Smoking status: Former Smoker    Packs/day: 2.50    Years: 52.00    Pack years: 130.00    Types: Cigarettes    Quit date: 05/06/1996    Years since quitting: 22.6   Smokeless tobacco: Never Used  Substance Use Topics   Alcohol use: Yes    Comment: 11/20/2015 "used to drink heavily; quit 05/06/1996"   Drug use: No     Allergies   Aspirin, Atorvastatin, and Cephalexin   Review of Systems Review of Systems  Constitutional: Negative for chills and fever.  HENT: Negative for facial swelling and sore throat.   Respiratory: Negative for shortness of breath.   Cardiovascular: Negative for chest pain.  Gastrointestinal: Positive for abdominal pain, blood in stool and constipation. Negative for nausea and vomiting.  Genitourinary: Negative for dysuria.  Musculoskeletal: Negative for back pain.  Skin: Negative for rash and wound.  Neurological: Negative for headaches.  Psychiatric/Behavioral: The patient is not nervous/anxious.      Physical Exam Updated Vital Signs BP (!) 161/88    Pulse 61    Temp 98 F (36.7 C) (Oral)    Resp 16    SpO2 98%   Physical Exam Vitals signs and nursing note reviewed.  Constitutional:      General: He is not in acute distress.    Appearance: He is well-developed. He is not diaphoretic.  HENT:     Head: Normocephalic and atraumatic.     Mouth/Throat:     Pharynx: No oropharyngeal exudate.  Eyes:     General: No scleral icterus.       Right eye: No discharge.        Left eye: No discharge.     Conjunctiva/sclera: Conjunctivae normal.     Pupils: Pupils are equal, round, and reactive to light.  Neck:     Musculoskeletal: Normal range of motion and neck  supple.     Thyroid: No thyromegaly.  Cardiovascular:     Rate and Rhythm: Normal rate and regular rhythm.     Heart sounds: Normal heart sounds. No murmur. No friction rub. No gallop.   Pulmonary:     Effort: Pulmonary effort is normal. No respiratory distress.       Breath sounds: Normal breath sounds. No stridor. No wheezing or rales.  Abdominal:     General: Bowel sounds are normal. There is no distension.     Palpations: Abdomen is soft.     Tenderness: There is no abdominal tenderness. There is no guarding or rebound.  Genitourinary:    Rectum: Guaiac result positive (melena). No tenderness.     Comments: No fecal impaction Musculoskeletal:     Comments: No tenderness on palpation of all joints of extremities; no midline cervical, thoracic, or lumbar tenderness  Lymphadenopathy:     Cervical: No cervical adenopathy.  Skin:    General: Skin is warm and dry.     Coloration: Skin is not pale.     Findings: No rash.  Neurological:     Mental Status: He is alert.     Coordination: Coordination normal.      ED Treatments / Results  Labs (all labs ordered are listed, but only abnormal results are displayed) Labs Reviewed  COMPREHENSIVE METABOLIC PANEL - Abnormal; Notable for the following components:      Result Value   Glucose, Bld 100 (*)    Total Protein 5.9 (*)    Albumin 3.2 (*)    All other components within normal limits  CBC - Abnormal; Notable for the following components:   RBC 2.72 (*)    Hemoglobin 9.0 (*)    HCT 28.1 (*)    MCV 103.3 (*)    All other components within normal limits  POC OCCULT BLOOD, ED - Abnormal; Notable for the following components:   Fecal Occult Bld POSITIVE (*)    All other components within normal limits  URINALYSIS, ROUTINE W REFLEX MICROSCOPIC  LIPASE, BLOOD    EKG None  Radiology Ct Head Wo Contrast  Result Date: 12/27/2018 CLINICAL DATA:  Fall several days ago.  Dementia. EXAM: CT HEAD WITHOUT CONTRAST TECHNIQUE: Contiguous axial images were obtained from the base of the skull through the vertex without intravenous contrast. COMPARISON:  September 03, 2016 FINDINGS: Brain: No subdural, epidural, or subarachnoid hemorrhage. Ventricles and sulci are prominent but stable. Cerebellum,  brainstem, and basal cisterns are normal. No mass effect or midline shift. Scattered white matter changes remain. Small lacunar infarct is seen in the left thalamus, unchanged. No acute cortical ischemia or infarct. Vascular: Calcified atherosclerosis is seen in the intracranial carotids. Skull: Normal. Negative for fracture or focal lesion. Sinuses/Orbits: No acute finding. Other: None. IMPRESSION: No acute intracranial abnormalities. Electronically Signed   By: Dorise Bullion III M.D   On: 12/27/2018 14:18   Ct Abdomen Pelvis W Contrast  Result Date: 12/27/2018 CLINICAL DATA:  High-grade bowel obstruction. EXAM: CT ABDOMEN AND PELVIS WITH CONTRAST TECHNIQUE: Multidetector CT imaging of the abdomen and pelvis was performed using the standard protocol following bolus administration of intravenous contrast. CONTRAST:  165m OMNIPAQUE IOHEXOL 300 MG/ML  SOLN COMPARISON:  Oct 07, 2018 FINDINGS: Lower chest: Choose 1. Partially visualized cardiac pacemaker leads. Calcific atherosclerotic disease of the coronary arteries and aorta. Hepatobiliary: No focal liver abnormality is seen. No gallstones, gallbladder wall thickening, or biliary dilatation. Pancreas: Numerous punctate pancreatic calcifications, usually associated with  chronic pancreatitis. Spleen: Normal in size without focal abnormality. Adrenals/Urinary Tract: Absent left kidney. Normal attenuation of the right kidney, apart from benign-appearing exophytic cyst off of the inferior pole of the kidney. Mild right perinephric fat stranding. Normal appearance of the urinary bladder. Stomach/Bowel: Stomach is within normal limits. No evidence of appendicitis. No evidence of bowel wall thickening, distention, or inflammatory changes. There is a lower anterior abdominal wall hernia slightly superior to the pubic ramus, which contains a nondilated small bowel loops. No evidence of incarceration. The neck of the hernia measures 2.5 cm. Large amount of formed stool  consistent with constipation. Left colonic diverticulosis without evidence of diverticulitis. Vascular/Lymphatic: Aortic atherosclerosis. No enlarged abdominal or pelvic lymph nodes. Patent aortobifemoral graft. Reproductive: Nonspecific coarse calcifications of the prostate gland. Other: Post anterior abdominal wall repair. Musculoskeletal: Severe spondylosis of the spine. Ankylosing changes at L1-L3. IMPRESSION: 1. No evidence of small-bowel obstruction. 2. Central lower anterior abdominal wall hernia midway between the umbilicus and the pubic ramus, which contains a nondilated small bowel loops. No evidence of incarceration. The neck of the hernia measures 2.5 cm. 3. Large amount of formed stool throughout the colon consistent with constipation. 4. Left colonic diverticulosis without evidence of diverticulitis. 5. Absent left kidney. 6. Calcific atherosclerotic disease of the aorta and its branches. Patent aortobifemoral graft. Electronically Signed   By: Fidela Salisbury M.D.   On: 12/27/2018 14:24    Procedures Procedures (including critical care time)  Medications Ordered in ED Medications  sodium chloride flush (NS) 0.9 % injection 3 mL (3 mLs Intravenous Given 12/27/18 1227)  iohexol (OMNIPAQUE) 300 MG/ML solution 100 mL (100 mLs Intravenous Contrast Given 12/27/18 1302)     Initial Impression / Assessment and Plan / ED Course  I have reviewed the triage vital signs and the nursing notes.  Pertinent labs & imaging results that were available during my care of the patient were reviewed by me and considered in my medical decision making (see chart for details).        Patient presenting with constipation abdominal pain.  He has had ongoing rectal bleeding.  Per chart review, this seemed to have stopped, however GI was observing the patient.  Colonoscopy was not completed.  A recommend repeat CBC.  Patient's hemoglobin is basically stable from past at 9.0 today.  CMP unremarkable.  CT  abdomen pelvis shows constipation, but no other acute findings.  CT head is negative for acute findings.  Considering stable hemoglobin, I discussed close outpatient follow-up with patient and his son and they are agreeable with plan.  Will start bowel regimen including MiraLAX and Metamucil as well as Dulcolax suppository and Colace.  Advised good hydration.  Strict return precautions given.  Advised to follow-up with PCP this week for recheck as well as follow-up to GI and cardiology to assess necessity of Eliquis considering this is probably the cause of patient's slow bleeding.  Patient and his son understand agree with plan.  Patient vitals stable throughout ED course and discharged in satisfactory condition. I discussed patient case with Dr. Wilson Singer who guided the patient's management and agrees with plan.   Final Clinical Impressions(s) / ED Diagnoses   Final diagnoses:  Constipation, unspecified constipation type    ED Discharge Orders         Ordered    docusate sodium (COLACE) 100 MG capsule  Every 12 hours     12/27/18 1516    bisacodyl (DULCOLAX) 10 MG suppository  As needed  12/27/18 1516    polyethylene glycol (MIRALAX) 17 g packet  Daily     12/27/18 1516    psyllium (METAMUCIL SMOOTH TEXTURE) 58.6 % powder  3 times daily     12/27/18 [address] PA-C 12/27/18 1607    Virgel Manifold, MD 12/31/18 [phone]

## 2018-12-27 NOTE — ED Triage Notes (Signed)
Pt had a fall on Tuesday. Since then he has had trouble having a bowel movement. Spoke w PCP and tried Mirilax. Pt also reports difficulty urinating. Pt has dementia, but he "thinks his chronic back pain might be worse than normal." Pt also has 4 abdominal hernias that he usually wears a brace for, that he took off yesterday to see if it helped with the constipation.

## 2018-12-27 NOTE — ED Notes (Signed)
Urine culture attach to urine sample

## 2018-12-27 NOTE — Discharge Instructions (Addendum)
Take Metamucil 3 times daily as prescribed.  Take MiraLAX 1-3 times daily until you produce a bowel movement.  You can use Dulcolax suppository daily as needed for severe constipation.  Take Colace daily to help soften your stools.  Make sure to drink plenty of fluids.  Please follow-up with your doctor this week for recheck of your symptoms as well as hemoglobin recheck.  Please follow-up with 1 of our gastroenterology for further monitoring and management of your GI bleeding.  Please discuss with your doctor or cardiologist about the necessity of the Eliquis, as this is probably causing the GI bleeding.  Please return the emergency department immediately if you develop any lightheadedness or passing out, chest pain, shortness of breath, worsening bleeding, or any other concerning symptoms.

## 2018-12-28 ENCOUNTER — Other Ambulatory Visit: Payer: Self-pay | Admitting: Family Medicine

## 2018-12-28 DIAGNOSIS — G309 Alzheimer's disease, unspecified: Secondary | ICD-10-CM | POA: Diagnosis not present

## 2018-12-28 DIAGNOSIS — R3989 Other symptoms and signs involving the genitourinary system: Secondary | ICD-10-CM

## 2018-12-28 DIAGNOSIS — I10 Essential (primary) hypertension: Secondary | ICD-10-CM | POA: Diagnosis not present

## 2018-12-28 DIAGNOSIS — I2510 Atherosclerotic heart disease of native coronary artery without angina pectoris: Secondary | ICD-10-CM | POA: Diagnosis not present

## 2018-12-28 DIAGNOSIS — K5790 Diverticulosis of intestine, part unspecified, without perforation or abscess without bleeding: Secondary | ICD-10-CM | POA: Diagnosis not present

## 2018-12-28 DIAGNOSIS — I480 Paroxysmal atrial fibrillation: Secondary | ICD-10-CM | POA: Diagnosis not present

## 2018-12-28 DIAGNOSIS — F0280 Dementia in other diseases classified elsewhere without behavioral disturbance: Secondary | ICD-10-CM | POA: Diagnosis not present

## 2018-12-29 ENCOUNTER — Telehealth: Payer: Self-pay | Admitting: Family Medicine

## 2018-12-29 NOTE — Telephone Encounter (Signed)
Pt. Needs to be seen or do telemed visit for this. Thanks, WS

## 2018-12-29 NOTE — Telephone Encounter (Signed)
Patient is now having normal BMs.  Ok to stop medication

## 2018-12-30 ENCOUNTER — Ambulatory Visit (INDEPENDENT_AMBULATORY_CARE_PROVIDER_SITE_OTHER): Payer: Medicare Other | Admitting: Family Medicine

## 2018-12-30 ENCOUNTER — Encounter: Payer: Self-pay | Admitting: Family Medicine

## 2018-12-30 DIAGNOSIS — F0280 Dementia in other diseases classified elsewhere without behavioral disturbance: Secondary | ICD-10-CM | POA: Diagnosis not present

## 2018-12-30 DIAGNOSIS — K922 Gastrointestinal hemorrhage, unspecified: Secondary | ICD-10-CM

## 2018-12-30 DIAGNOSIS — I480 Paroxysmal atrial fibrillation: Secondary | ICD-10-CM | POA: Diagnosis not present

## 2018-12-30 DIAGNOSIS — I10 Essential (primary) hypertension: Secondary | ICD-10-CM | POA: Diagnosis not present

## 2018-12-30 DIAGNOSIS — I2510 Atherosclerotic heart disease of native coronary artery without angina pectoris: Secondary | ICD-10-CM | POA: Diagnosis not present

## 2018-12-30 DIAGNOSIS — K5903 Drug induced constipation: Secondary | ICD-10-CM

## 2018-12-30 DIAGNOSIS — G309 Alzheimer's disease, unspecified: Secondary | ICD-10-CM | POA: Diagnosis not present

## 2018-12-30 DIAGNOSIS — K5790 Diverticulosis of intestine, part unspecified, without perforation or abscess without bleeding: Secondary | ICD-10-CM | POA: Diagnosis not present

## 2018-12-30 NOTE — Progress Notes (Signed)
Subjective:    Patient ID: Brad [name], male    DOB: 11/15/1927, 85 y.o.   MRN: [id number]   HPI: Brad [name] is a 85 y.o. male presenting for constipation for a week. Went to [employer] for abd pain 3 days ago. Had a large BM after he was given regimen of dulcolax suppository, colace, metamucil and miralax. Now wanting to know what to use for maintenance. Pt. Is taking iron for GI blood loss anemia. He does not desire definitive evaluation for that at his age. He states his BMs are green. Not in any pain.   Depression screen [facility] 2/9 11/26/2018 08/19/2018 07/20/2018 06/08/2018 05/23/2018  Decreased Interest 0 0 0 0 0  Down, Depressed, Hopeless 0 0 3 2 0  PHQ - 2 Score 0 0 3 2 0  Altered sleeping - - 0 2 -  Tired, decreased energy - - 3 3 -  Change in appetite - - 0 0 -  Feeling bad or failure about yourself  - - 3 1 -  Trouble concentrating - - 3 3 -  Moving slowly or fidgety/restless - - 0 0 -  Suicidal thoughts - - 0 0 -  PHQ-9 Score - - 12 11 -  Some recent data might be hidden     Relevant past medical, surgical, family and social history reviewed and updated as indicated.  Interim medical history since our last visit reviewed. Allergies and medications reviewed and updated.  ROS:  Review of Systems  Constitutional: Negative for fever.  Respiratory: Negative for shortness of breath.   Cardiovascular: Negative for chest pain.  Gastrointestinal: Positive for abdominal distention, blood in stool and constipation. Negative for anal bleeding, diarrhea, nausea, rectal pain and vomiting.  Musculoskeletal: Negative for arthralgias.  Skin: Negative for rash.     Social History   Tobacco Use  Smoking Status Former Smoker  . Packs/day: 2.50  . Years: 52.00  . Pack years: 130.00  . Types: Cigarettes  . Quit date: 05/06/1996  . Years since quitting: 22.6  Smokeless Tobacco Never Used       Objective:     Wt Readings from Last 3 Encounters:  12/13/18 181 lb 10.5 oz (82.4  kg)  11/26/18 183 lb 3.2 oz (83.1 kg)  08/19/18 177 lb 3.2 oz (80.4 kg)     Exam deferred. Pt. Harboring due to COVID 19. Phone visit performed.   Assessment & Plan:   1. Lower GI bleeding   2. Drug-induced constipation     He will need to continue taking iron, and as a result, also the colace and metamucil. Use miralax if he goes 2 days without BM or gets bloated. Save dulcolax as a last resort.  No orders of the defined types were placed in this encounter.     Diagnoses and all orders for this visit:  Lower GI bleeding  Drug-induced constipation    Virtual Visit via telephone Note  I discussed the limitations, risks, security and privacy concerns of performing an evaluation and management service by telephone and the availability of in person appointments. The patient was identified with two identifiers. Pt.expressed understanding and agreed to proceed. Pt. Is at home. Dr. [name] is in his office.  Follow Up Instructions:   I discussed the assessment and treatment plan with the patient. The patient was provided an opportunity to ask questions and all were answered. The patient agreed with the plan and demonstrated an understanding of the instructions.  The patient was advised to call back or seek an in-person evaluation if the symptoms worsen or if the condition fails to improve as anticipated.   Total minutes including chart review and phone contact time: 15   Follow up plan: Return if symptoms worsen or fail to improve as well as usual follow up.  [name], MD [place]

## 2018-12-31 ENCOUNTER — Other Ambulatory Visit: Payer: Self-pay

## 2018-12-31 ENCOUNTER — Ambulatory Visit (INDEPENDENT_AMBULATORY_CARE_PROVIDER_SITE_OTHER): Payer: Medicare Other

## 2018-12-31 DIAGNOSIS — K5790 Diverticulosis of intestine, part unspecified, without perforation or abscess without bleeding: Secondary | ICD-10-CM

## 2018-12-31 DIAGNOSIS — I10 Essential (primary) hypertension: Secondary | ICD-10-CM

## 2018-12-31 DIAGNOSIS — J449 Chronic obstructive pulmonary disease, unspecified: Secondary | ICD-10-CM | POA: Diagnosis not present

## 2018-12-31 DIAGNOSIS — I2510 Atherosclerotic heart disease of native coronary artery without angina pectoris: Secondary | ICD-10-CM | POA: Diagnosis not present

## 2018-12-31 DIAGNOSIS — G309 Alzheimer's disease, unspecified: Secondary | ICD-10-CM

## 2018-12-31 DIAGNOSIS — Z9181 History of falling: Secondary | ICD-10-CM

## 2018-12-31 DIAGNOSIS — E785 Hyperlipidemia, unspecified: Secondary | ICD-10-CM | POA: Diagnosis not present

## 2018-12-31 DIAGNOSIS — F0280 Dementia in other diseases classified elsewhere without behavioral disturbance: Secondary | ICD-10-CM | POA: Diagnosis not present

## 2018-12-31 DIAGNOSIS — D649 Anemia, unspecified: Secondary | ICD-10-CM

## 2018-12-31 DIAGNOSIS — I480 Paroxysmal atrial fibrillation: Secondary | ICD-10-CM

## 2019-01-04 DIAGNOSIS — K5790 Diverticulosis of intestine, part unspecified, without perforation or abscess without bleeding: Secondary | ICD-10-CM | POA: Diagnosis not present

## 2019-01-04 DIAGNOSIS — I2510 Atherosclerotic heart disease of native coronary artery without angina pectoris: Secondary | ICD-10-CM | POA: Diagnosis not present

## 2019-01-04 DIAGNOSIS — F0280 Dementia in other diseases classified elsewhere without behavioral disturbance: Secondary | ICD-10-CM | POA: Diagnosis not present

## 2019-01-04 DIAGNOSIS — I10 Essential (primary) hypertension: Secondary | ICD-10-CM | POA: Diagnosis not present

## 2019-01-04 DIAGNOSIS — I480 Paroxysmal atrial fibrillation: Secondary | ICD-10-CM | POA: Diagnosis not present

## 2019-01-04 DIAGNOSIS — G309 Alzheimer's disease, unspecified: Secondary | ICD-10-CM | POA: Diagnosis not present

## 2019-01-05 ENCOUNTER — Telehealth: Payer: Self-pay | Admitting: Family Medicine

## 2019-01-05 NOTE — Telephone Encounter (Signed)
Please contact the patient :yes take 2 tylenol BID WS

## 2019-01-05 NOTE — Telephone Encounter (Signed)
Son states that patient is having a lot of joint pain and patient has stopped taking 2 tylenol twice daily and started taking 1 tylenol twice daily.  Is ok for patient to increase to 2 tablets bid

## 2019-01-06 ENCOUNTER — Ambulatory Visit: Payer: Medicare Other | Admitting: Family Medicine

## 2019-01-06 NOTE — Telephone Encounter (Signed)
Pt's son advised of recommendation Verbalizes understanding

## 2019-01-11 DIAGNOSIS — I10 Essential (primary) hypertension: Secondary | ICD-10-CM | POA: Diagnosis not present

## 2019-01-11 DIAGNOSIS — F0280 Dementia in other diseases classified elsewhere without behavioral disturbance: Secondary | ICD-10-CM | POA: Diagnosis not present

## 2019-01-11 DIAGNOSIS — I480 Paroxysmal atrial fibrillation: Secondary | ICD-10-CM | POA: Diagnosis not present

## 2019-01-11 DIAGNOSIS — K5790 Diverticulosis of intestine, part unspecified, without perforation or abscess without bleeding: Secondary | ICD-10-CM | POA: Diagnosis not present

## 2019-01-11 DIAGNOSIS — I2510 Atherosclerotic heart disease of native coronary artery without angina pectoris: Secondary | ICD-10-CM | POA: Diagnosis not present

## 2019-01-11 DIAGNOSIS — G309 Alzheimer's disease, unspecified: Secondary | ICD-10-CM | POA: Diagnosis not present

## 2019-01-12 ENCOUNTER — Other Ambulatory Visit: Payer: Self-pay | Admitting: Family Medicine

## 2019-01-15 DIAGNOSIS — I10 Essential (primary) hypertension: Secondary | ICD-10-CM | POA: Diagnosis not present

## 2019-01-15 DIAGNOSIS — G309 Alzheimer's disease, unspecified: Secondary | ICD-10-CM | POA: Diagnosis not present

## 2019-01-15 DIAGNOSIS — I2510 Atherosclerotic heart disease of native coronary artery without angina pectoris: Secondary | ICD-10-CM | POA: Diagnosis not present

## 2019-01-15 DIAGNOSIS — K5790 Diverticulosis of intestine, part unspecified, without perforation or abscess without bleeding: Secondary | ICD-10-CM | POA: Diagnosis not present

## 2019-01-15 DIAGNOSIS — F0280 Dementia in other diseases classified elsewhere without behavioral disturbance: Secondary | ICD-10-CM | POA: Diagnosis not present

## 2019-01-15 DIAGNOSIS — I480 Paroxysmal atrial fibrillation: Secondary | ICD-10-CM | POA: Diagnosis not present

## 2019-01-17 DIAGNOSIS — I480 Paroxysmal atrial fibrillation: Secondary | ICD-10-CM | POA: Diagnosis not present

## 2019-01-17 DIAGNOSIS — E785 Hyperlipidemia, unspecified: Secondary | ICD-10-CM | POA: Diagnosis not present

## 2019-01-17 DIAGNOSIS — K5790 Diverticulosis of intestine, part unspecified, without perforation or abscess without bleeding: Secondary | ICD-10-CM | POA: Diagnosis not present

## 2019-01-17 DIAGNOSIS — F0280 Dementia in other diseases classified elsewhere without behavioral disturbance: Secondary | ICD-10-CM | POA: Diagnosis not present

## 2019-01-17 DIAGNOSIS — J449 Chronic obstructive pulmonary disease, unspecified: Secondary | ICD-10-CM | POA: Diagnosis not present

## 2019-01-17 DIAGNOSIS — D649 Anemia, unspecified: Secondary | ICD-10-CM | POA: Diagnosis not present

## 2019-01-17 DIAGNOSIS — Z9181 History of falling: Secondary | ICD-10-CM | POA: Diagnosis not present

## 2019-01-17 DIAGNOSIS — I2510 Atherosclerotic heart disease of native coronary artery without angina pectoris: Secondary | ICD-10-CM | POA: Diagnosis not present

## 2019-01-17 DIAGNOSIS — I10 Essential (primary) hypertension: Secondary | ICD-10-CM | POA: Diagnosis not present

## 2019-01-17 DIAGNOSIS — G309 Alzheimer's disease, unspecified: Secondary | ICD-10-CM | POA: Diagnosis not present

## 2019-01-18 ENCOUNTER — Other Ambulatory Visit: Payer: Self-pay | Admitting: Neurology

## 2019-01-18 DIAGNOSIS — I10 Essential (primary) hypertension: Secondary | ICD-10-CM | POA: Diagnosis not present

## 2019-01-18 DIAGNOSIS — K5790 Diverticulosis of intestine, part unspecified, without perforation or abscess without bleeding: Secondary | ICD-10-CM | POA: Diagnosis not present

## 2019-01-18 DIAGNOSIS — I480 Paroxysmal atrial fibrillation: Secondary | ICD-10-CM | POA: Diagnosis not present

## 2019-01-18 DIAGNOSIS — G309 Alzheimer's disease, unspecified: Secondary | ICD-10-CM | POA: Diagnosis not present

## 2019-01-18 DIAGNOSIS — I2510 Atherosclerotic heart disease of native coronary artery without angina pectoris: Secondary | ICD-10-CM | POA: Diagnosis not present

## 2019-01-18 DIAGNOSIS — F0390 Unspecified dementia without behavioral disturbance: Secondary | ICD-10-CM

## 2019-01-18 DIAGNOSIS — F03A0 Unspecified dementia, mild, without behavioral disturbance, psychotic disturbance, mood disturbance, and anxiety: Secondary | ICD-10-CM

## 2019-01-18 DIAGNOSIS — F0280 Dementia in other diseases classified elsewhere without behavioral disturbance: Secondary | ICD-10-CM | POA: Diagnosis not present

## 2019-02-01 ENCOUNTER — Encounter: Payer: Self-pay | Admitting: Family Medicine

## 2019-02-01 ENCOUNTER — Ambulatory Visit (INDEPENDENT_AMBULATORY_CARE_PROVIDER_SITE_OTHER): Payer: Medicare Other | Admitting: Family Medicine

## 2019-02-01 DIAGNOSIS — G309 Alzheimer's disease, unspecified: Secondary | ICD-10-CM

## 2019-02-01 DIAGNOSIS — F0280 Dementia in other diseases classified elsewhere without behavioral disturbance: Secondary | ICD-10-CM | POA: Diagnosis not present

## 2019-02-01 DIAGNOSIS — N390 Urinary tract infection, site not specified: Secondary | ICD-10-CM | POA: Diagnosis not present

## 2019-02-01 DIAGNOSIS — F03A0 Unspecified dementia, mild, without behavioral disturbance, psychotic disturbance, mood disturbance, and anxiety: Secondary | ICD-10-CM

## 2019-02-01 DIAGNOSIS — F0390 Unspecified dementia without behavioral disturbance: Secondary | ICD-10-CM | POA: Diagnosis not present

## 2019-02-01 LAB — URINALYSIS, COMPLETE
Bilirubin, UA: NEGATIVE
Glucose, UA: NEGATIVE
Leukocytes,UA: NEGATIVE
Nitrite, UA: NEGATIVE
Protein,UA: NEGATIVE
RBC, UA: NEGATIVE
Specific Gravity, UA: 1.025 (ref 1.005–1.030)
Urobilinogen, Ur: 0.2 mg/dL (ref 0.2–1.0)
pH, UA: 5 (ref 5.0–7.5)

## 2019-02-01 LAB — MICROSCOPIC EXAMINATION
Bacteria, UA: NONE SEEN
Epithelial Cells (non renal): NONE SEEN /hpf (ref 0–10)
RBC, Urine: NONE SEEN /hpf (ref 0–2)
Renal Epithel, UA: NONE SEEN /hpf
WBC, UA: NONE SEEN /hpf (ref 0–5)

## 2019-02-01 NOTE — Progress Notes (Signed)
Subjective:    Patient ID: Brad [name], male    DOB: 1927-12-20, 85 y.o.   MRN: [id number]   HPI: Brad [name] is a 85 y.o. male presenting for a few days with  little more memory trouble than usual. This occurs when he gets a urinary infection. No frequency, dysuria. No fever, chills sweats. Has frequent UTI and is a memory care pt. With Alzheimers. History given by son, Brad [name]. Father (Pt.) concurs.   Depression screen [facility] 2/9 11/26/2018 08/19/2018 07/20/2018 06/08/2018 05/23/2018  Decreased Interest 0 0 0 0 0  Down, Depressed, Hopeless 0 0 3 2 0  PHQ - 2 Score 0 0 3 2 0  Altered sleeping - - 0 2 -  Tired, decreased energy - - 3 3 -  Change in appetite - - 0 0 -  Feeling bad or failure about yourself  - - 3 1 -  Trouble concentrating - - 3 3 -  Moving slowly or fidgety/restless - - 0 0 -  Suicidal thoughts - - 0 0 -  PHQ-9 Score - - 12 11 -  Some recent data might be hidden     Relevant past medical, surgical, family and social history reviewed and updated as indicated.  Interim medical history since our last visit reviewed. Allergies and medications reviewed and updated.  ROS:  Review of Systems  Constitutional: Negative for fever.  Respiratory: Negative for shortness of breath.   Cardiovascular: Negative for chest pain.  Musculoskeletal: Negative for arthralgias.  Skin: Negative for rash.  Psychiatric/Behavioral: Positive for confusion.     Social History   Tobacco Use  Smoking Status Former Smoker  . Packs/day: 2.50  . Years: 52.00  . Pack years: 130.00  . Types: Cigarettes  . Quit date: 05/06/1996  . Years since quitting: 22.7  Smokeless Tobacco Never Used       Objective:     Wt Readings from Last 3 Encounters:  12/13/18 181 lb 10.5 oz (82.4 kg)  11/26/18 183 lb 3.2 oz (83.1 kg)  08/19/18 177 lb 3.2 oz (80.4 kg)     Exam deferred. Pt. Harboring due to COVID 19. Phone visit performed.   Assessment & Plan:   1. Mild dementia ([place])   2. Dementia  due to Alzheimer's disease ([place])   3. Frequent UTI     No orders of the defined types were placed in this encounter.   Orders Placed This Encounter  Procedures  . Urine Culture  . Urinalysis, Complete      Diagnoses and all orders for this visit:  Mild dementia ([place]) -     Urinalysis, Complete -     Urine Culture  Dementia due to Alzheimer's disease (HCC) -     Urinalysis, Complete -     Urine Culture  Frequent UTI -     Urinalysis, Complete -     Urine Culture    Virtual Visit via telephone Note  I discussed the limitations, risks, security and privacy concerns of performing an evaluation and management service by telephone and the availability of in person appointments. The patient was identified with two identifiers. Pt.expressed understanding and agreed to proceed. Pt. Is at home. Dr. [name] is in his office.  Follow Up Instructions:   I discussed the assessment and treatment plan with the patient. The patient was provided an opportunity to ask questions and all were answered. The patient agreed with the plan and demonstrated an understanding of the instructions.  The patient was advised to call back or seek an in-person evaluation if the symptoms worsen or if the condition fails to improve as anticipated.   Total minutes including chart review and phone contact time: 8   Follow up plan: Return if symptoms worsen or fail to improve.  [name], MD [place]

## 2019-02-02 LAB — URINE CULTURE

## 2019-02-08 ENCOUNTER — Ambulatory Visit (INDEPENDENT_AMBULATORY_CARE_PROVIDER_SITE_OTHER): Payer: Medicare Other | Admitting: *Deleted

## 2019-02-08 DIAGNOSIS — I495 Sick sinus syndrome: Secondary | ICD-10-CM

## 2019-02-09 ENCOUNTER — Other Ambulatory Visit: Payer: Self-pay | Admitting: Neurology

## 2019-02-09 ENCOUNTER — Ambulatory Visit: Payer: Medicare Other | Admitting: Neurology

## 2019-02-09 DIAGNOSIS — F0390 Unspecified dementia without behavioral disturbance: Secondary | ICD-10-CM

## 2019-02-09 DIAGNOSIS — F03A0 Unspecified dementia, mild, without behavioral disturbance, psychotic disturbance, mood disturbance, and anxiety: Secondary | ICD-10-CM

## 2019-02-09 LAB — CUP PACEART REMOTE DEVICE CHECK
Battery Remaining Longevity: 62 mo
Battery Voltage: 3 V
Brady Statistic AP VP Percent: 92.26 %
Brady Statistic AP VS Percent: 2.88 %
Brady Statistic AS VP Percent: 0.31 %
Brady Statistic AS VS Percent: 4.55 %
Brady Statistic RA Percent Paced: 94.73 %
Brady Statistic RV Percent Paced: 92.7 %
Date Time Interrogation Session: 20200921121026
Implantable Lead Implant Date: 20170703
Implantable Lead Implant Date: 20170703
Implantable Lead Location: 753859
Implantable Lead Location: 753860
Implantable Lead Model: 3830
Implantable Lead Model: 5076
Implantable Pulse Generator Implant Date: 20170703
Lead Channel Impedance Value: 285 Ohm
Lead Channel Impedance Value: 304 Ohm
Lead Channel Impedance Value: 380 Ohm
Lead Channel Impedance Value: 380 Ohm
Lead Channel Pacing Threshold Amplitude: 0.625 V
Lead Channel Pacing Threshold Amplitude: 1.25 V
Lead Channel Pacing Threshold Pulse Width: 0.4 ms
Lead Channel Pacing Threshold Pulse Width: 0.4 ms
Lead Channel Sensing Intrinsic Amplitude: 1 mV
Lead Channel Sensing Intrinsic Amplitude: 1 mV
Lead Channel Sensing Intrinsic Amplitude: 5 mV
Lead Channel Sensing Intrinsic Amplitude: 5 mV
Lead Channel Setting Pacing Amplitude: 1.5 V
Lead Channel Setting Pacing Amplitude: 2.5 V
Lead Channel Setting Pacing Pulse Width: 0.4 ms
Lead Channel Setting Sensing Sensitivity: 0.9 mV

## 2019-02-11 ENCOUNTER — Other Ambulatory Visit: Payer: Self-pay

## 2019-02-11 ENCOUNTER — Encounter: Payer: Self-pay | Admitting: Internal Medicine

## 2019-02-11 ENCOUNTER — Ambulatory Visit (INDEPENDENT_AMBULATORY_CARE_PROVIDER_SITE_OTHER): Payer: Medicare Other | Admitting: Internal Medicine

## 2019-02-11 VITALS — BP 144/86 | HR 74 | Ht 66.0 in | Wt 182.0 lb

## 2019-02-11 DIAGNOSIS — I495 Sick sinus syndrome: Secondary | ICD-10-CM

## 2019-02-11 DIAGNOSIS — I25119 Atherosclerotic heart disease of native coronary artery with unspecified angina pectoris: Secondary | ICD-10-CM | POA: Diagnosis not present

## 2019-02-11 DIAGNOSIS — I10 Essential (primary) hypertension: Secondary | ICD-10-CM

## 2019-02-11 DIAGNOSIS — Z950 Presence of cardiac pacemaker: Secondary | ICD-10-CM

## 2019-02-11 NOTE — Progress Notes (Signed)
HPI Mr. Herbst returns today for followup. He is a pleasant elderly man with a h/o syncope, s/p PPM insertion, and PAF. He has had a chronic ascending aneurysm. He denies chest pain or more syncope. His gait has become unstable. He is walking with a walker. He still gets out but has become more sedentary.  Allergies  Allergen Reactions  . Aspirin Shortness Of Breath and Swelling  . Atorvastatin Other (See Comments)    Leg pain, feet pain  . Cephalexin Other (See Comments)    unknown     Current Outpatient Medications  Medication Sig Dispense Refill  . acetaminophen (TYLENOL ARTHRITIS PAIN) 650 MG CR tablet Take 650 mg by mouth 2 (two) times a day.     Marland Kitchen apixaban (ELIQUIS) 5 MG TABS tablet Take 1 tablet (5 mg total) by mouth 2 (two) times daily.    Marland Kitchen b complex vitamins tablet Take 1 tablet by mouth daily.    . Butenafine HCl 1 % cream Apply twice daily to affected areas (Patient taking differently: Apply 1 application topically 2 (two) times daily. Apply twice daily to affected areas) 30 g 5  . donepezil (ARICEPT) 10 MG tablet TAKE 1 TABLET BY MOUTH EVERY DAY 30 tablet 1  . fexofenadine (ALLEGRA) 180 MG tablet TAKE 1 TABLET (180 MG TOTAL) BY MOUTH DAILY. FOR ALLERGY SYMPTOMS 90 tablet 0  . finasteride (PROSCAR) 5 MG tablet TAKE 1 TABLET BY MOUTH EVERY DAY (Patient taking differently: Take 5 mg by mouth daily. ) 90 tablet 0  . fish oil-omega-3 fatty acids 1000 MG capsule Take 1 g by mouth at bedtime.     . fluticasone (FLONASE) 50 MCG/ACT nasal spray SPRAY 2 SPRAYS INTO EACH NOSTRIL EVERY DAY (Patient taking differently: Place 1 spray into both nostrils daily as needed for allergies. ) 48 g 2  . isosorbide mononitrate (IMDUR) 30 MG 24 hr tablet TAKE 1 TABLET BY MOUTH EVERY DAY (Patient taking differently: Take 30 mg by mouth daily. ) 90 tablet 0  . KLOR-CON M20 20 MEQ tablet TAKE 1 TABLET BY MOUTH EVERY DAY (Patient taking differently: Take 20 mEq by mouth daily. ) 90 tablet 1  .  memantine (NAMENDA XR) 28 MG CP24 24 hr capsule TAKE 1 CAPSULE (28 MG TOTAL) BY MOUTH DAILY. 90 capsule 0  . metoprolol tartrate (LOPRESSOR) 25 MG tablet TAKE 1 TABLET BY MOUTH TWICE A DAY 180 tablet 0  . Multiple Vitamin (MULTIVITAMIN WITH MINERALS) TABS tablet Take 1 tablet by mouth every evening. PRESER VISION     . polyethylene glycol (MIRALAX) 17 g packet Take 17 g by mouth daily. 14 each 0  . psyllium (METAMUCIL SMOOTH TEXTURE) 58.6 % powder Take 1 packet by mouth 3 (three) times daily. 283 g 0  . rosuvastatin (CRESTOR) 5 MG tablet TAKE 1 TABLET BY MOUTH EVERY DAY 90 tablet 0  . tamsulosin (FLOMAX) 0.4 MG CAPS capsule TAKE 2 CAPSULES (0.8 MG TOTAL) BY MOUTH AT BEDTIME. 180 capsule 0  . TURMERIC CURCUMIN PO Take 1 tablet by mouth at bedtime.      No current facility-administered medications for this visit.      Past Medical History:  Diagnosis Date  . CAD (coronary artery disease)    s/p stent to OM2 in past;  Last LHC 3/05: EF 60%, left RA occluded, right RA ok, prox to mid LAD 60%, oD1 75-80%, oD2 small 75%, pOM1 50%, OM2 stent ok with 50% before stent, mRCA 30%, dRCA  30%, PDA 70%.  Last dobutamine myoview 5/06: no scar or ischemia, EF 60%.  . Chronic lower back pain   . COPD (chronic obstructive pulmonary disease) ([place])   . DJD (degenerative joint disease)   . Enlarged prostate   . Ethanolism ([place])   . GERD (gastroesophageal reflux disease)   . Hernia, abdominal    "he has ~ 4; wears binder" (11/20/2015)  . HLD (hyperlipidemia)   . HTN (hypertension)    Last echo 1/07:  Normal LVF, LV thickness upper limits of normal, mild aortic root dilatation, mild to mod MAC, mild LAE, borderline RVH, mild RAE.  . Macular degeneration   . Pacemaker    DR Radford Pax; a. s/p MDT dual chamber PPM with His Bundle pacing  . PAD (peripheral artery disease) ([place])   . Paroxysmal atrial fibrillation (HCC)   . Psoriasis   . S/P AAA repair    Dr. Roxan Hockey  . Scoliosis   . Tobacco abuse   .  Unilateral congenital absence of kidney     ROS:   All systems reviewed and negative except as noted in the HPI.   Past Surgical History:  Procedure Laterality Date  . APPENDECTOMY    . CARPAL TUNNEL RELEASE Right 12/26/2014   Procedure: RIGHT ENDOSCOPIC CARPAL TUNNEL SYNDROME RELEASE;  Surgeon: Milly Jakob, MD;  Location: [facility];  Service: Orthopedics;  Laterality: Right;  . CATARACT EXTRACTION, BILATERAL Bilateral   . CORONARY ANGIOPLASTY WITH STENT PLACEMENT     s/p stent to OM2 in past;   . EP IMPLANTABLE DEVICE N/A 11/20/2015   Procedure: Pacemaker Implant;  Surgeon: Evans Lance, MD;  Location: [facility];  Service: Cardiovascular;  Laterality: N/A;  . HERNIA REPAIR    . INSERT / REPLACE / REMOVE PACEMAKER  11/20/2015  . RESECTION AND GRAFTING OF INFARENAL  ABDOMINAL  AORTIC ANEURYSM  05/03/2002     Family History  Problem Relation Age of Onset  . Cancer Mother        uterine  . Stroke Mother        hemi plagic  . Cancer Sister   . COPD Sister   . Early death Brother   . Cancer Brother        Lip  . Stroke Father   . AAA (abdominal aortic aneurysm) Son   . Arthritis Son        knee  . COPD Brother   . Cancer Son        testicular   . Hypertension Son   . Early death Sister      Social History   Socioeconomic History  . Marital status: Widowed    Spouse name: Not on file  . Number of children: 3  . Years of education: Not on file  . Highest education level: Not on file  Occupational History  . Occupation: retire    Comment: Event organiser  . Financial resource strain: Not on file  . Food insecurity    Worry: Not on file    Inability: Not on file  . Transportation needs    Medical: Not on file    Non-medical: Not on file  Tobacco Use  . Smoking status: Former Smoker    Packs/day: 2.50    Years: 52.00    Pack years: 130.00    Types: Cigarettes    Quit date: 05/06/1996    Years since quitting: 22.7  .  Smokeless tobacco: Never Used  Substance and Sexual Activity  . Alcohol use: Yes    Comment: 11/20/2015 "used to drink heavily; quit 05/06/1996"  . Drug use: No  . Sexual activity: Never  Lifestyle  . Physical activity    Days per week: Not on file    Minutes per session: Not on file  . Stress: Not on file  Relationships  . Social Herbalist on phone: Not on file    Gets together: Not on file    Attends religious service: Not on file    Active member of club or organization: Not on file    Attends meetings of clubs or organizations: Not on file    Relationship status: Not on file  . Intimate partner violence    Fear of current or ex partner: Not on file    Emotionally abused: Not on file    Physically abused: Not on file    Forced sexual activity: Not on file  Other Topics Concern  . Not on file  Social History Narrative  . Not on file     BP (!) 144/86   Pulse 74   Ht _0  (1.676 m)   Wt 182 lb (82.6 kg)   SpO2 94%   BMI 29.38 kg/m   Physical Exam:  Well appearing NAD HEENT: Unremarkable Neck:  No JVD, no thyromegally Lymphatics:  No adenopathy Back:  No CVA tenderness Lungs:  Clear with no wheezes HEART:  Regular rate rhythm, no murmurs, no rubs, no clicks Abd:  soft, positive bowel sounds, no organomegally, no rebound, no guarding Ext:  2 plus pulses, no edema, no cyanosis, no clubbing Skin:  No rashes no nodules Neuro:  CN II through XII intact, motor grossly intact  EKG - none  DEVICE  Normal device function.  See PaceArt for details.   Assess/Plan: 1. Sinus node dysfunction - he is pacing about 96% of the time. No change. 2. PPM - his medtronic DDD PM is working normally.  3. PAF - he is maintaining NSR. No atrial fib since his last visit.  Mikle Bosworth.D.

## 2019-02-11 NOTE — Patient Instructions (Signed)
Medication Instructions:  Your physician recommends that you continue on your current medications as directed. Please refer to the Current Medication list given to you today.  Labwork: None ordered.  Testing/Procedures: None ordered.  Follow-Up: Your physician wants you to follow-up in: one year with Dr. Lovena Le.   You will receive a reminder letter in the mail two months in advance. If you don't receive a letter, please call our office to schedule the follow-up appointment.  Remote monitoring is used to monitor your Pacemaker from home. This monitoring reduces the number of office visits required to check your device to one time per year. It allows [place] to keep an eye on the functioning of your device to ensure it is working properly. You are scheduled for a device check from home on 05/10/2019. You may send your transmission at any time that day. If you have a wireless device, the transmission will be sent automatically. After your physician reviews your transmission, you will receive a postcard with your next transmission date.  Any Other Special Instructions Will Be Listed Below (If Applicable).  If you need a refill on your cardiac medications before your next appointment, please call your pharmacy.

## 2019-02-12 ENCOUNTER — Encounter: Payer: Self-pay | Admitting: Neurology

## 2019-02-12 ENCOUNTER — Other Ambulatory Visit: Payer: Self-pay

## 2019-02-12 ENCOUNTER — Ambulatory Visit (INDEPENDENT_AMBULATORY_CARE_PROVIDER_SITE_OTHER): Payer: Medicare Other | Admitting: Neurology

## 2019-02-12 VITALS — BP 138/76 | HR 77 | Ht 66.0 in | Wt 182.0 lb

## 2019-02-12 DIAGNOSIS — F03B0 Unspecified dementia, moderate, without behavioral disturbance, psychotic disturbance, mood disturbance, and anxiety: Secondary | ICD-10-CM

## 2019-02-12 DIAGNOSIS — F0390 Unspecified dementia without behavioral disturbance: Secondary | ICD-10-CM | POA: Diagnosis not present

## 2019-02-12 DIAGNOSIS — I25119 Atherosclerotic heart disease of native coronary artery with unspecified angina pectoris: Secondary | ICD-10-CM | POA: Diagnosis not present

## 2019-02-12 MED ORDER — DONEPEZIL HCL 10 MG PO TABS: ORAL_TABLET | ORAL | 3 refills | Status: AC

## 2019-02-12 NOTE — Patient Instructions (Signed)
1. Continue all your medications  2. No further driving  3. Follow-up in 6-7 months, call for any changes  FALL PRECAUTIONS: Be cautious when walking. Scan the area for obstacles that may increase the risk of trips and falls. When getting up in the mornings, sit up at the edge of the bed for a few minutes before getting out of bed. Consider elevating the bed at the head end to avoid drop of blood pressure when getting up. Walk always in a well-lit room (use night lights in the walls). Avoid area rugs or power cords from appliances in the middle of the walkways. Use a walker or a cane if necessary and consider physical therapy for balance exercise. Get your eyesight checked regularly.  FINANCIAL OVERSIGHT: Supervision, especially oversight when making financial decisions or transactions is also recommended.  HOME SAFETY: Consider the safety of the kitchen when operating appliances like stoves, microwave oven, and blender. Consider having supervision and share cooking responsibilities until no longer able to participate in those. Accidents with firearms and other hazards in the house should be identified and addressed as well.  DRIVING: Regarding driving, in patients with progressive memory problems, driving will be impaired. We advise to have someone else do the driving if trouble finding directions or if minor accidents are reported. Independent driving assessment is available to determine safety of driving.  ABILITY TO BE LEFT ALONE: If patient is unable to contact 911 operator, consider using LifeLine, or when the need is there, arrange for someone to stay with patients. Smoking is a fire hazard, consider supervision or cessation. Risk of wandering should be assessed by caregiver and if detected at any point, supervision and safe proof recommendations should be instituted.  MEDICATION SUPERVISION: Inability to self-administer medication needs to be constantly addressed. Implement a mechanism to  ensure safe administration of the medications.  RECOMMENDATIONS FOR ALL PATIENTS WITH MEMORY PROBLEMS: 1. Continue to exercise (Recommend 30 minutes of walking everyday, or 3 hours every week) 2. Increase social interactions - continue going to [place] and enjoy social gatherings with friends and family 3. Eat healthy, avoid fried foods and eat more fruits and vegetables 4. Maintain adequate blood pressure, blood sugar, and blood cholesterol level. Reducing the risk of stroke and cardiovascular disease also helps promoting better memory. 5. Avoid stressful situations. Live a simple life and avoid aggravations. Organize your time and prepare for the next day in anticipation. 6. Sleep well, avoid any interruptions of sleep and avoid any distractions in the bedroom that may interfere with adequate sleep quality 7. Avoid sugar, avoid sweets as there is a strong link between excessive sugar intake, diabetes, and cognitive impairment The Mediterranean diet has been shown to help patients reduce the risk of progressive memory disorders and reduces cardiovascular risk. This includes eating fish, eat fruits and green leafy vegetables, nuts like almonds and hazelnuts, walnuts, and also use olive oil. Avoid fast foods and fried foods as much as possible. Avoid sweets and sugar as sugar use has been linked to worsening of memory function.  There is always a concern of gradual progression of memory problems. If this is the case, then we may need to adjust level of care according to patient needs. Support, both to the patient and caregiver, should then be put into place.

## 2019-02-12 NOTE — Progress Notes (Signed)
NEUROLOGY FOLLOW UP OFFICE NOTE  Brad [name] [id number] 1928/01/10  HISTORY OF PRESENT ILLNESS: I had the pleasure of seeing Brad [name] in follow-up in the neurology clinic on 02/12/2019.  The patient was last seen in February 2019 for dementia. He is again accompanied by his son who helps supplement the history today. Since his last visit, he reports his memory is "bad." He continues to live alone, his other son comes twice a day to make sure he takes his medications and meals. He continues to drive 20 miles from home and denies any accidents. His son is concerned he may get forget where he is going but denies any calls about getting lost. His son manages finances. He is independent with dressing and bathing. Sleep is good, no wandering behavior. No paranoia or hallucinations. He reports back pain. He gets dizzy standing up sometimes. He denies any headaches, focal numbness/tingling/weakness. He had a fall 2-3 months ago. He is on Donepezil 24m daily and Namenda XR 258mdaily without side effects.   History on Initial Assessment 07/03/2016: This is a pleasant 85yo RH man with a history of hypertension, hyperlipidemia, CAD, sinus node dysfunction s/p PPM, paroxysmal atrial fibrillation, with worsening memory. He reports his memory is "terrible." He lives alone and states his sons have been helping him with bill payments and medications. His son has been noticing gradual memory changes over the past couple of years. He was concerned about his ability to keep up with his medications and took over a year ago. He took over [employer] year ago as well. He and his brother noticed that confusion is worse in the evenings, he would get his phone and remote control confused, he would have trouble with his TV, or get confused as to where he is at. [place]. His sons see him in the morning and evening for meals. His son has always worried  about his driving, he has not seen him drive recklessly, but states he would miss a turn but be able to find his way home. The patient states he has gotten lost driving. He states he has left the stove on a few times. There have been some hallucinations where he sees and talks to his dead wife, he has both visual and auditory hallucinations of her. No personality changes.   He gets dizzy when standing and has fallen a few times. He has neck and back pain worse in the past 6 months. He had right carpal tunnel surgery 1-2 years ago and is not dropping his fork anymore, but continues to have numbness and tingling in that hand. He denies any headaches, diplopia, dysarthria/dysphagia, bowel/bladder dysfunction, anosmia. He has occasional mild tremors. Sleep is good, but son reports he occasionally gets up at 1-2am and thinks it is time to wake up. He has had a 10lb weight loss even with good appetite. No family history of dementia. He denies any history of significant head injuries. He stopped drinking alcohol 25 years ago (used to be a heavy drinker).   PAST MEDICAL HISTORY: Past Medical History:  Diagnosis Date  . CAD (coronary artery disease)    s/p stent to OM2 in past;  Last LHC 3/05: EF 60%, left RA occluded, right RA ok, prox to mid LAD 60%, oD1 75-80%, oD2 small 75%, pOM1 50%, OM2 stent ok with 50% before stent, mRCA 30%, dRCA 30%, PDA 70%.  Last  dobutamine myoview 5/06: no scar or ischemia, EF 60%.  . Chronic lower back pain   . COPD (chronic obstructive pulmonary disease) ([place])   . DJD (degenerative joint disease)   . Enlarged prostate   . Ethanolism ([place])   . GERD (gastroesophageal reflux disease)   . Hemorrhoids   . Hernia, abdominal    "he has ~ 4; wears binder" (11/20/2015)  . HLD (hyperlipidemia)   . HTN (hypertension)    Last echo 1/07:  Normal LVF, LV thickness upper limits of normal, mild aortic root dilatation, mild to mod MAC, mild LAE, borderline RVH, mild RAE.  . Macular  degeneration   . Pacemaker    DR Radford Pax; a. s/p MDT dual chamber PPM with His Bundle pacing  . PAD (peripheral artery disease) ([place])   . Paroxysmal atrial fibrillation (H[facility])   . Psoriasis   . S/P AAA repair    Dr. Roxan Hockey  . Scoliosis   . Tobacco abuse   . Unilateral congenital absence of kidney     MEDICATIONS: Current Outpatient Medications on File Prior to Visit  Medication Sig Dispense Refill  . acetaminophen (TYLENOL ARTHRITIS PAIN) 650 MG CR tablet Take 650 mg by mouth 2 (two) times a day.     Marland Kitchen apixaban (ELIQUIS) 5 MG TABS tablet Take 1 tablet (5 mg total) by mouth 2 (two) times daily.    Marland Kitchen b complex vitamins tablet Take 1 tablet by mouth daily.    . Butenafine HCl 1 % cream Apply twice daily to affected areas (Patient taking differently: Apply 1 application topically 2 (two) times daily. Apply twice daily to affected areas) 30 g 5  . donepezil (ARICEPT) 10 MG tablet TAKE 1 TABLET BY MOUTH EVERY DAY 30 tablet 1  . fexofenadine (ALLEGRA) 180 MG tablet TAKE 1 TABLET (180 MG TOTAL) BY MOUTH DAILY. FOR ALLERGY SYMPTOMS 90 tablet 0  . finasteride (PROSCAR) 5 MG tablet TAKE 1 TABLET BY MOUTH EVERY DAY (Patient taking differently: Take 5 mg by mouth daily. ) 90 tablet 0  . fish oil-omega-3 fatty acids 1000 MG capsule Take 1 g by mouth at bedtime.     . fluticasone (FLONASE) 50 MCG/ACT nasal spray SPRAY 2 SPRAYS INTO EACH NOSTRIL EVERY DAY (Patient taking differently: Place 1 spray into both nostrils daily as needed for allergies. ) 48 g 2  . isosorbide mononitrate (IMDUR) 30 MG 24 hr tablet TAKE 1 TABLET BY MOUTH EVERY DAY (Patient taking differently: Take 30 mg by mouth daily. ) 90 tablet 0  . KLOR-CON M20 20 MEQ tablet TAKE 1 TABLET BY MOUTH EVERY DAY (Patient taking differently: Take 20 mEq by mouth daily. ) 90 tablet 1  . memantine (NAMENDA XR) 28 MG CP24 24 hr capsule TAKE 1 CAPSULE (28 MG TOTAL) BY MOUTH DAILY. 90 capsule 0  . metoprolol tartrate (LOPRESSOR) 25 MG tablet TAKE 1  TABLET BY MOUTH TWICE A DAY 180 tablet 0  . Multiple Vitamin (MULTIVITAMIN WITH MINERALS) TABS tablet Take 1 tablet by mouth every evening. PRESER VISION     . polyethylene glycol (MIRALAX) 17 g packet Take 17 g by mouth daily. 14 each 0  . psyllium (METAMUCIL SMOOTH TEXTURE) 58.6 % powder Take 1 packet by mouth 3 (three) times daily. 283 g 0  . rosuvastatin (CRESTOR) 5 MG tablet TAKE 1 TABLET BY MOUTH EVERY DAY 90 tablet 0  . tamsulosin (FLOMAX) 0.4 MG CAPS capsule TAKE 2 CAPSULES (0.8 MG TOTAL) BY MOUTH AT BEDTIME. 180 capsule 0  .  TURMERIC CURCUMIN PO Take 1 tablet by mouth at bedtime.      No current facility-administered medications on file prior to visit.     ALLERGIES: Allergies  Allergen Reactions  . Aspirin Shortness Of Breath and Swelling  . Atorvastatin Other (See Comments)    Leg pain, feet pain  . Cephalexin Other (See Comments)    unknown    FAMILY HISTORY: Family History  Problem Relation Age of Onset  . Cancer Mother        uterine  . Stroke Mother        hemi plagic  . Cancer Sister   . COPD Sister   . Early death Brother   . Cancer Brother        Lip  . Stroke Father   . AAA (abdominal aortic aneurysm) Son   . Arthritis Son        knee  . COPD Brother   . Cancer Son        testicular   . Hypertension Son   . Early death Sister     SOCIAL HISTORY: Social History   Socioeconomic History  . Marital status: Widowed    Spouse name: Not on file  . Number of children: 3  . Years of education: Not on file  . Highest education level: Not on file  Occupational History  . Occupation: retire    Comment: Event organiser  . Financial resource strain: Not on file  . Food insecurity    Worry: Not on file    Inability: Not on file  . Transportation needs    Medical: Not on file    Non-medical: Not on file  Tobacco Use  . Smoking status: Former Smoker    Packs/day: 2.50    Years: 52.00    Pack years: 130.00    Types: Cigarettes    Quit  date: 05/06/1996    Years since quitting: 22.7  . Smokeless tobacco: Never Used  Substance and Sexual Activity  . Alcohol use: Yes    Comment: 11/20/2015 "used to drink heavily; quit 05/06/1996"  . Drug use: No  . Sexual activity: Never  Lifestyle  . Physical activity    Days per week: Not on file    Minutes per session: Not on file  . Stress: Not on file  Relationships  . Social Herbalist on phone: Not on file    Gets together: Not on file    Attends religious service: Not on file    Active member of club or organization: Not on file    Attends meetings of clubs or organizations: Not on file    Relationship status: Not on file  . Intimate partner violence    Fear of current or ex partner: Not on file    Emotionally abused: Not on file    Physically abused: Not on file    Forced sexual activity: Not on file  Other Topics Concern  . Not on file  Social History Narrative   Right handed      GED      Lives alone. Son lives next door    REVIEW OF SYSTEMS: Constitutional: No fevers, chills, or sweats, no generalized fatigue, change in appetite Eyes: No visual changes, double vision, eye pain Ear, nose and throat: No hearing loss, ear pain, nasal congestion, sore throat Cardiovascular: No chest pain, palpitations Respiratory:  No shortness of breath at rest or with exertion, wheezes GastrointestinaI: No nausea,  vomiting, diarrhea, abdominal pain, fecal incontinence Genitourinary:  No dysuria, urinary retention or frequency Musculoskeletal:  No neck pain, +back pain Integumentary: No rash, pruritus, skin lesions Neurological: as above Psychiatric: No depression, insomnia, anxiety Endocrine: No palpitations, fatigue, diaphoresis, mood swings, change in appetite, change in weight, increased thirst Hematologic/Lymphatic:  No anemia, purpura, petechiae. Allergic/Immunologic: no itchy/runny eyes, nasal congestion, recent allergic reactions, rashes  PHYSICAL EXAM:  Vitals:   02/12/19 1332  BP: 138/76  Pulse: 77  SpO2: 98%   General: No acute distress Head:  Normocephalic/atraumatic Skin/Extremities: No rash, no edema Neurological Exam: Mental status: alert and oriented to person, place, no dysarthria or aphasia, Fund of knowledge is appropriate.  Remote memory intact.0/3 delayed recall.  Attention and concentration are reduced. Able to name objects and repeat phrases. MMSE - Mini Mental State Exam 02/12/2019 10/27/2017 01/01/2017  Orientation to time 0 3 3  Orientation to Place _0 Registration 0 3 3  Attention/ Calculation _1 Recall 0 2 0  Language- name 2 objects _2 Language- repeat _3 Language- follow 3 step command _4 Language- read & follow direction _5 Write a sentence _6 Copy design 0 1 1  Total score _7 Cranial Nerves: Pupils equal , round. No facial asymmetry. Motor: 5/5 throughout with no pronator drift. No incoordination on finger to nose testing. Gait slow and cautious, hunched posture with cane, no ataxia.   IMPRESSION: This is a pleasant 85 yo RH man with a history of  hypertension, hyperlipidemia, CAD, sinus node dysfunction s/p PPM, paroxysmal atrial fibrillation, with moderate dementia, likely due to Alzheimer's disease. Continue Donepezil 72m daily and Namenda XR 252mdaily. MMSE today 14/30 (23/30 in August 2018, 18/30 in February 2018). We discussed no further driving. Continue close family supervision, he will eventually need more help at home, he does not want to move to SNF in the future. He will follow-up in 6 months and knows to call for any changes.  Thank you for allowing me to participate in his care.  Please do not hesitate to call for any questions or concerns.  The duration of this appointment visit was 25 minutes of face-to-face time with the patient.  Greater than 50% of this time was spent in counseling, explanation of diagnosis, planning of further management, and coordination  of care.   KaEllouise NewerM.D.   CC: Dr. StLivia Snellen

## 2019-02-16 ENCOUNTER — Encounter: Payer: Self-pay | Admitting: Cardiology

## 2019-02-16 NOTE — Progress Notes (Signed)
Remote pacemaker transmission.   

## 2019-03-08 ENCOUNTER — Other Ambulatory Visit: Payer: Self-pay | Admitting: Family Medicine

## 2019-03-23 ENCOUNTER — Other Ambulatory Visit: Payer: Self-pay | Admitting: Family Medicine

## 2019-03-23 DIAGNOSIS — R3989 Other symptoms and signs involving the genitourinary system: Secondary | ICD-10-CM

## 2019-03-31 DIAGNOSIS — Z23 Encounter for immunization: Secondary | ICD-10-CM | POA: Diagnosis not present

## 2019-04-02 ENCOUNTER — Telehealth: Payer: Self-pay | Admitting: Internal Medicine

## 2019-04-02 NOTE — Telephone Encounter (Signed)
New Message Patients son is calling regards to some questions on the patients medical history. Please advise

## 2019-04-02 NOTE — Telephone Encounter (Signed)
Looks like Brad [name] is primary card though pt has not seen Dr. Harrington Challenger since 2018. Has been following with Dr. Lovena Le.

## 2019-04-03 ENCOUNTER — Other Ambulatory Visit: Payer: Self-pay | Admitting: Family Medicine

## 2019-04-05 NOTE — Telephone Encounter (Signed)
Returned call to son.  Son's are trying to determine Pt's PMH-disagreement regarding stents.  Advised to call Dr. Roxan Hockey regarding AAA repair in 2003.  Phone number given.    Advised Pt  also has had at least one stent in his heart.  No further questions.

## 2019-04-06 ENCOUNTER — Other Ambulatory Visit: Payer: Self-pay | Admitting: Family Medicine

## 2019-04-08 DIAGNOSIS — H353122 Nonexudative age-related macular degeneration, left eye, intermediate dry stage: Secondary | ICD-10-CM | POA: Diagnosis not present

## 2019-04-18 ENCOUNTER — Other Ambulatory Visit: Payer: Self-pay | Admitting: Family Medicine

## 2019-04-29 ENCOUNTER — Encounter: Payer: Self-pay | Admitting: Family Medicine

## 2019-04-29 ENCOUNTER — Other Ambulatory Visit: Payer: Self-pay

## 2019-04-29 ENCOUNTER — Ambulatory Visit (INDEPENDENT_AMBULATORY_CARE_PROVIDER_SITE_OTHER): Payer: Medicare Other | Admitting: Family Medicine

## 2019-04-29 VITALS — BP 180/81 | HR 64 | Temp 98.9°F | Ht 66.0 in | Wt 186.0 lb

## 2019-04-29 DIAGNOSIS — I25119 Atherosclerotic heart disease of native coronary artery with unspecified angina pectoris: Secondary | ICD-10-CM | POA: Diagnosis not present

## 2019-04-29 DIAGNOSIS — R410 Disorientation, unspecified: Secondary | ICD-10-CM

## 2019-04-29 DIAGNOSIS — N390 Urinary tract infection, site not specified: Secondary | ICD-10-CM | POA: Diagnosis not present

## 2019-04-29 LAB — URINALYSIS, COMPLETE
Bilirubin, UA: NEGATIVE
Glucose, UA: NEGATIVE
Leukocytes,UA: NEGATIVE
Nitrite, UA: NEGATIVE
Protein,UA: NEGATIVE
RBC, UA: NEGATIVE
Specific Gravity, UA: 1.025 (ref 1.005–1.030)
Urobilinogen, Ur: 0.2 mg/dL (ref 0.2–1.0)
pH, UA: 5.5 (ref 5.0–7.5)

## 2019-04-29 LAB — MICROSCOPIC EXAMINATION
Bacteria, UA: NONE SEEN
Epithelial Cells (non renal): NONE SEEN /hpf (ref 0–10)
RBC, Urine: NONE SEEN /hpf (ref 0–2)
Renal Epithel, UA: NONE SEEN /hpf
WBC, UA: NONE SEEN /hpf (ref 0–5)

## 2019-04-29 MED ORDER — AFRIN 12 HOUR 0.05 % NA SOLN: 1.0000 | Freq: Two times a day (BID) | NASAL | 0 refills | Status: DC

## 2019-04-29 NOTE — Progress Notes (Signed)
Chief Complaint  Patient presents with  . Altered Mental Status    HPI  Patient presents today for confusion. He is seeing ghosts and other hallucinations. They didn't leave until 2 AM last night, he told his son. He not es that there was a crowd last night. No threatening or anxiety provoking images. Pt. Remains very forgetful. He can't remember "what I had for lunch."His two sons are caregivers, [name] and Johnny. [name] gives history as well today.  PT. Also continues to have copious nasal discharge, clear and mucoid. Daily for several months. Not relieved with flonase or allegra so they were DCed.  [name] tells me that he checks on pt. BP at home, it is usually good. High today, but not commonly.   PMH: Smoking status noted ROS: Review of Systems  Constitutional: Negative for activity change and fever.  HENT: Positive for rhinorrhea. Negative for sinus pressure, sinus pain, sneezing and sore throat.   Respiratory: Negative for shortness of breath.   Cardiovascular: Negative for chest pain and palpitations.  Gastrointestinal: Negative for abdominal pain.  Musculoskeletal: Positive for arthralgias and myalgias.  Skin: Negative for rash.  Psychiatric/Behavioral: Positive for confusion and decreased concentration. Negative for behavioral problems and dysphoric mood. The patient is not nervous/anxious.      Objective: There were no vitals taken for this visit. Gen: NAD, alert, cooperative with exam HEENT: NCAT, EOMI, PERRL CV: RRR, good S1/S2, no murmur Resp: CTABL, no wheezes, non-labored Abd: SNTND, BS present, no guarding or organomegaly  Ext: No edema, warm Neuro: Alert and oriented, No gross deficits  Assessment and plan:  1. Frequent UTI     No orders of the defined types were placed in this encounter.   Orders Placed This Encounter  Procedures  . urinalysis- dip and micro    Follow up as needed.  [name], MD

## 2019-04-29 NOTE — Patient Instructions (Signed)
I'll send a urine culture and blood work, but unless something turns up, he is currently at his maximum available treatment for his dementia.

## 2019-04-30 LAB — CBC WITH DIFFERENTIAL/PLATELET
Basophils Absolute: 0 10*3/uL (ref 0.0–0.2)
Basos: 0 %
EOS (ABSOLUTE): 0.3 10*3/uL (ref 0.0–0.4)
Eos: 7 %
Hematocrit: 32.8 % — ABNORMAL LOW (ref 37.5–51.0)
Hemoglobin: 10.6 g/dL — ABNORMAL LOW (ref 13.0–17.7)
Immature Grans (Abs): 0 10*3/uL (ref 0.0–0.1)
Immature Granulocytes: 0 %
Lymphocytes Absolute: 1.4 10*3/uL (ref 0.7–3.1)
Lymphs: 30 %
MCH: 30.9 pg (ref 26.6–33.0)
MCHC: 32.3 g/dL (ref 31.5–35.7)
MCV: 96 fL (ref 79–97)
Monocytes Absolute: 0.5 10*3/uL (ref 0.1–0.9)
Monocytes: 11 %
Neutrophils Absolute: 2.3 10*3/uL (ref 1.4–7.0)
Neutrophils: 52 %
Platelets: 172 10*3/uL (ref 150–450)
RBC: 3.43 x10E6/uL — ABNORMAL LOW (ref 4.14–5.80)
RDW: 14.1 % (ref 11.6–15.4)
WBC: 4.5 10*3/uL (ref 3.4–10.8)

## 2019-04-30 LAB — CMP14+EGFR
ALT: 21 IU/L (ref 0–44)
AST: 17 IU/L (ref 0–40)
Albumin/Globulin Ratio: 1.8 (ref 1.2–2.2)
Albumin: 4.4 g/dL (ref 3.6–4.6)
Alkaline Phosphatase: 58 IU/L (ref 39–117)
BUN/Creatinine Ratio: 22 (ref 10–24)
BUN: 17 mg/dL (ref 8–27)
Bilirubin Total: 0.5 mg/dL (ref 0.0–1.2)
CO2: 24 mmol/L (ref 20–29)
Calcium: 9.4 mg/dL (ref 8.6–10.2)
Chloride: 106 mmol/L (ref 96–106)
Creatinine, Ser: 0.77 mg/dL (ref 0.76–1.27)
GFR calc Af Amer: 93 mL/min/{1.73_m2} (ref >59)
GFR calc non Af Amer: 80 mL/min/{1.73_m2} (ref >59)
Globulin, Total: 2.5 g/dL (ref 1.5–4.5)
Glucose: 88 mg/dL (ref 65–99)
Potassium: 4.7 mmol/L (ref 3.5–5.2)
Sodium: 144 mmol/L (ref 134–144)
Total Protein: 6.9 g/dL (ref 6.0–8.5)

## 2019-04-30 LAB — TSH: TSH: 0.638 u[IU]/mL (ref 0.450–4.500)

## 2019-05-04 LAB — URINE CULTURE

## 2019-05-10 ENCOUNTER — Ambulatory Visit (INDEPENDENT_AMBULATORY_CARE_PROVIDER_SITE_OTHER): Payer: Medicare Other | Admitting: *Deleted

## 2019-05-10 DIAGNOSIS — Z950 Presence of cardiac pacemaker: Secondary | ICD-10-CM | POA: Diagnosis not present

## 2019-05-10 LAB — CUP PACEART REMOTE DEVICE CHECK
Battery Remaining Longevity: 54 mo
Battery Voltage: 2.99 V
Brady Statistic AP VP Percent: 80.7 %
Brady Statistic AP VS Percent: 7.34 %
Brady Statistic AS VP Percent: 6.37 %
Brady Statistic AS VS Percent: 5.59 %
Brady Statistic RA Percent Paced: 83.82 %
Brady Statistic RV Percent Paced: 85.42 %
Date Time Interrogation Session: 20201221083812
Implantable Lead Implant Date: 20170703
Implantable Lead Implant Date: 20170703
Implantable Lead Location: 753859
Implantable Lead Location: 753860
Implantable Lead Model: 3830
Implantable Lead Model: 5076
Implantable Pulse Generator Implant Date: 20170703
Lead Channel Impedance Value: 266 Ohm
Lead Channel Impedance Value: 285 Ohm
Lead Channel Impedance Value: 380 Ohm
Lead Channel Impedance Value: 399 Ohm
Lead Channel Pacing Threshold Amplitude: 0.75 V
Lead Channel Pacing Threshold Amplitude: 1.25 V
Lead Channel Pacing Threshold Pulse Width: 0.4 ms
Lead Channel Pacing Threshold Pulse Width: 0.4 ms
Lead Channel Sensing Intrinsic Amplitude: 0.875 mV
Lead Channel Sensing Intrinsic Amplitude: 0.875 mV
Lead Channel Sensing Intrinsic Amplitude: 6.375 mV
Lead Channel Sensing Intrinsic Amplitude: 6.375 mV
Lead Channel Setting Pacing Amplitude: 1.5 V
Lead Channel Setting Pacing Amplitude: 2.5 V
Lead Channel Setting Pacing Pulse Width: 0.4 ms
Lead Channel Setting Sensing Sensitivity: 0.9 mV

## 2019-05-19 ENCOUNTER — Other Ambulatory Visit: Payer: Self-pay | Admitting: Family Medicine

## 2019-05-26 ENCOUNTER — Encounter: Payer: Self-pay | Admitting: Neurology

## 2019-06-01 ENCOUNTER — Other Ambulatory Visit: Payer: Self-pay | Admitting: Family Medicine

## 2019-06-01 DIAGNOSIS — I10 Essential (primary) hypertension: Secondary | ICD-10-CM

## 2019-06-02 ENCOUNTER — Other Ambulatory Visit: Payer: Self-pay

## 2019-06-02 ENCOUNTER — Ambulatory Visit (INDEPENDENT_AMBULATORY_CARE_PROVIDER_SITE_OTHER): Payer: Medicare Other | Admitting: Family Medicine

## 2019-06-02 ENCOUNTER — Ambulatory Visit: Payer: Medicare Other | Attending: Internal Medicine

## 2019-06-02 ENCOUNTER — Encounter: Payer: Self-pay | Admitting: Family Medicine

## 2019-06-02 DIAGNOSIS — J449 Chronic obstructive pulmonary disease, unspecified: Secondary | ICD-10-CM

## 2019-06-02 DIAGNOSIS — R112 Nausea with vomiting, unspecified: Secondary | ICD-10-CM

## 2019-06-02 DIAGNOSIS — Z20822 Contact with and (suspected) exposure to covid-19: Secondary | ICD-10-CM

## 2019-06-02 MED ORDER — ALBUTEROL SULFATE HFA 108 (90 BASE) MCG/ACT IN AERS: 2.0000 | INHALATION_SPRAY | Freq: Four times a day (QID) | RESPIRATORY_TRACT | 0 refills | Status: DC | PRN

## 2019-06-02 MED ORDER — AEROCHAMBER MV MISC: 0 refills | Status: AC

## 2019-06-02 MED ORDER — ONDANSETRON HCL 4 MG PO TABS: 4.0000 mg | ORAL_TABLET | Freq: Three times a day (TID) | ORAL | 0 refills | Status: AC | PRN

## 2019-06-02 NOTE — Progress Notes (Signed)
Virtual Visit via Telephone Note  I connected with Brad [name] on 06/02/19 at 9:31 AM by telephone and verified that I am speaking with the correct person using two identifiers. Brad [name] is currently located at home and his son is currently with him during this visit. The provider, Loman Brooklyn, FNP is located in their home at time of visit.  I discussed the limitations, risks, security and privacy concerns of performing an evaluation and management service by telephone and the availability of in person appointments. I also discussed with the patient that there may be a patient responsible charge related to this service. The patient expressed understanding and agreed to proceed.  Subjective: PCP: [name], MD  Chief Complaint  Patient presents with  . Breathing Problem   Patient has been complaining this morning that it hurts to breathe.  The discomfort he feels is located in his chest and between his shoulder blades.  He describes it more of a tightness.  He does have COPD but does not have an albuterol inhaler as he does not have the coordination to use it correctly.  He did also vomit this morning before breakfast but is currently eating with no further complaints.  He has no other respiratory symptoms or fever.  His son reports he does not appear to be in any distress.  They do not have a way to check his oxygen saturation.   ROS: Per HPI  Current Outpatient Medications:  .  acetaminophen (TYLENOL ARTHRITIS PAIN) 650 MG CR tablet, Take 650 mg by mouth 2 (two) times a day. , Disp: , Rfl:  .  albuterol (VENTOLIN HFA) 108 (90 Base) MCG/ACT inhaler, Inhale 2 puffs into the lungs every 6 (six) hours as needed for wheezing or shortness of breath., Disp: 18 g, Rfl: 0 .  b complex vitamins tablet, Take 1 tablet by mouth daily., Disp: , Rfl:  .  Butenafine HCl 1 % cream, Apply twice daily to affected areas (Patient taking differently: Apply 1 application topically 2 (two)  times daily. Apply twice daily to affected areas), Disp: 30 g, Rfl: 5 .  donepezil (ARICEPT) 10 MG tablet, Take 1 tablet daily, Disp: 90 tablet, Rfl: 3 .  ELIQUIS 5 MG TABS tablet, TAKE 1 TABLET BY MOUTH TWICE A DAY, Disp: 60 tablet, Rfl: 5 .  fexofenadine (ALLEGRA) 180 MG tablet, TAKE 1 TABLET (180 MG TOTAL) BY MOUTH DAILY. FOR ALLERGY SYMPTOMS, Disp: 90 tablet, Rfl: 1 .  finasteride (PROSCAR) 5 MG tablet, TAKE 1 TABLET BY MOUTH EVERY DAY, Disp: 90 tablet, Rfl: 0 .  fish oil-omega-3 fatty acids 1000 MG capsule, Take 1 g by mouth at bedtime. , Disp: , Rfl:  .  fluticasone (FLONASE) 50 MCG/ACT nasal spray, SPRAY 2 SPRAYS INTO EACH NOSTRIL EVERY DAY (Patient taking differently: Place 1 spray into both nostrils daily as needed for allergies. ), Disp: 48 g, Rfl: 2 .  isosorbide mononitrate (IMDUR) 30 MG 24 hr tablet, TAKE 1 TABLET (30 MG TOTAL) BY MOUTH DAILY. (NEEDS TO BE SEEN BEFORE NEXT REFILL), Disp: 30 tablet, Rfl: 0 .  KLOR-CON M20 20 MEQ tablet, TAKE 1 TABLET BY MOUTH EVERY DAY, Disp: 90 tablet, Rfl: 0 .  memantine (NAMENDA XR) 28 MG CP24 24 hr capsule, Take 1 capsule (28 mg total) by mouth daily. Needs to be seen for further refills., Disp: 90 capsule, Rfl: 0 .  metoprolol tartrate (LOPRESSOR) 25 MG tablet, TAKE 1 TABLET BY MOUTH TWICE A DAY, Disp:  180 tablet, Rfl: 0 .  Multiple Vitamin (MULTIVITAMIN WITH MINERALS) TABS tablet, Take 1 tablet by mouth every evening. PRESER VISION , Disp: , Rfl:  .  ondansetron (ZOFRAN) 4 MG tablet, Take 1 tablet (4 mg total) by mouth every 8 (eight) hours as needed for nausea or vomiting., Disp: 20 tablet, Rfl: 0 .  oxymetazoline (AFRIN 12 HOUR) 0.05 % nasal spray, Place 1 spray into both nostrils 2 (two) times daily., Disp: 30 mL, Rfl: 0 .  polyethylene glycol (MIRALAX) 17 g packet, Take 17 g by mouth daily., Disp: 14 each, Rfl: 0 .  psyllium (METAMUCIL SMOOTH TEXTURE) 58.6 % powder, Take 1 packet by mouth 3 (three) times daily., Disp: 283 g, Rfl: 0 .   rosuvastatin (CRESTOR) 5 MG tablet, TAKE 1 TABLET BY MOUTH EVERY DAY, Disp: 90 tablet, Rfl: 0 .  Spacer/Aero-Holding Chambers (AEROCHAMBER MV) inhaler, Use as instructed, Disp: 1 each, Rfl: 0 .  tamsulosin (FLOMAX) 0.4 MG CAPS capsule, TAKE 2 CAPSULES (0.8 MG TOTAL) BY MOUTH AT BEDTIME., Disp: 180 capsule, Rfl: 0 .  TURMERIC CURCUMIN PO, Take 1 tablet by mouth at bedtime. , Disp: , Rfl:   Allergies  Allergen Reactions  . Aspirin Shortness Of Breath and Swelling  . Atorvastatin Other (See Comments)    Leg pain, feet pain  . Cephalexin Other (See Comments)    unknown   Past Medical History:  Diagnosis Date  . CAD (coronary artery disease)    s/p stent to OM2 in past;  Last LHC 3/05: EF 60%, left RA occluded, right RA ok, prox to mid LAD 60%, oD1 75-80%, oD2 small 75%, pOM1 50%, OM2 stent ok with 50% before stent, mRCA 30%, dRCA 30%, PDA 70%.  Last dobutamine myoview 5/06: no scar or ischemia, EF 60%.  . Chronic lower back pain   . COPD (chronic obstructive pulmonary disease) ([place])   . DJD (degenerative joint disease)   . Enlarged prostate   . Ethanolism ([place])   . GERD (gastroesophageal reflux disease)   . Hemorrhoids   . Hernia, abdominal    "he has ~ 4; wears binder" (11/20/2015)  . HLD (hyperlipidemia)   . HTN (hypertension)    Last echo 1/07:  Normal LVF, LV thickness upper limits of normal, mild aortic root dilatation, mild to mod MAC, mild LAE, borderline RVH, mild RAE.  . Macular degeneration   . Pacemaker    DR Radford Pax; a. s/p MDT dual chamber PPM with His Bundle pacing  . PAD (peripheral artery disease) ([place])   . Paroxysmal atrial fibrillation ([facility])   . Psoriasis   . S/P AAA repair    Dr. Roxan Hockey  . Scoliosis   . Tobacco abuse   . Unilateral congenital absence of kidney     Observations/Objective: A&O  No respiratory distress or wheezing audible over the phone Mood, judgement, and thought processes all WNL  Assessment and Plan: 1. Chronic obstructive pulmonary  disease, unspecified COPD type ([place]) - Encouraged patient to try using albuterol inhaler to see if it would open him up enough to relieve the tightness he is experiencing.  I have sent a spacer for him to use and explained how to use it to the son. - albuterol (VENTOLIN HFA) 108 (90 Base) MCG/ACT inhaler; Inhale 2 puffs into the lungs every 6 (six) hours as needed for wheezing or shortness of breath.  Dispense: 18 g; Refill: 0 - Spacer/Aero-Holding Chambers (AEROCHAMBER MV) inhaler; Use as instructed  Dispense: 1 each; Refill: 0  2.  Non-intractable vomiting with nausea, unspecified vomiting type - Sending Zofran in case patient's nausea and vomiting returns. - ondansetron (ZOFRAN) 4 MG tablet; Take 1 tablet (4 mg total) by mouth every 8 (eight) hours as needed for nausea or vomiting.  Dispense: 20 tablet; Refill: 0  We did discuss that due to his breathing and vomiting COVID-19 cannot be ruled out.  Son is going to schedule him for a Covid test later today.  I did also advised that if he becomes short of breath and appears to be in some distress they need to call EMS or get him to the closest emergency room.  Follow Up Instructions:  I discussed the assessment and treatment plan with the patient. The patient was provided an opportunity to ask questions and all were answered. The patient agreed with the plan and demonstrated an understanding of the instructions.   The patient was advised to call back or seek an in-person evaluation if the symptoms worsen or if the condition fails to improve as anticipated.  The above assessment and management plan was discussed with the patient. The patient verbalized understanding of and has agreed to the management plan. Patient is aware to call the clinic if symptoms persist or worsen. Patient is aware when to return to the clinic for a follow-up visit. Patient educated on when it is appropriate to go to the emergency department.   Time call ended: 9:40 AM  I  provided 12 minutes of non-face-to-face time during this encounter.  Hendricks Limes, MSN, APRN, FNP-C [facility] 06/02/19

## 2019-06-03 LAB — NOVEL CORONAVIRUS, NAA: SARS-CoV-2, NAA: NOT DETECTED

## 2019-06-05 ENCOUNTER — Inpatient Hospital Stay (HOSPITAL_COMMUNITY)
Admission: EM | Admit: 2019-06-05 | Discharge: 2019-06-08 | DRG: 189 | Disposition: A | Discharge status: Home / Self Care | Payer: Medicare Other | Attending: Internal Medicine | Admitting: Internal Medicine

## 2019-06-05 ENCOUNTER — Emergency Department (HOSPITAL_COMMUNITY): Payer: Medicare Other

## 2019-06-05 ENCOUNTER — Observation Stay (HOSPITAL_COMMUNITY): Payer: Medicare Other

## 2019-06-05 ENCOUNTER — Encounter (HOSPITAL_COMMUNITY): Payer: Self-pay | Admitting: Emergency Medicine

## 2019-06-05 DIAGNOSIS — J449 Chronic obstructive pulmonary disease, unspecified: Secondary | ICD-10-CM | POA: Diagnosis present

## 2019-06-05 DIAGNOSIS — I10 Essential (primary) hypertension: Secondary | ICD-10-CM | POA: Diagnosis present

## 2019-06-05 DIAGNOSIS — Z79899 Other long term (current) drug therapy: Secondary | ICD-10-CM

## 2019-06-05 DIAGNOSIS — Z950 Presence of cardiac pacemaker: Secondary | ICD-10-CM

## 2019-06-05 DIAGNOSIS — Z66 Do not resuscitate: Secondary | ICD-10-CM | POA: Diagnosis not present

## 2019-06-05 DIAGNOSIS — J9621 Acute and chronic respiratory failure with hypoxia: Principal | ICD-10-CM | POA: Diagnosis present

## 2019-06-05 DIAGNOSIS — Q600 Renal agenesis, unilateral: Secondary | ICD-10-CM

## 2019-06-05 DIAGNOSIS — I4891 Unspecified atrial fibrillation: Secondary | ICD-10-CM | POA: Diagnosis present

## 2019-06-05 DIAGNOSIS — G8929 Other chronic pain: Secondary | ICD-10-CM | POA: Diagnosis present

## 2019-06-05 DIAGNOSIS — R1310 Dysphagia, unspecified: Secondary | ICD-10-CM

## 2019-06-05 DIAGNOSIS — Z955 Presence of coronary angioplasty implant and graft: Secondary | ICD-10-CM

## 2019-06-05 DIAGNOSIS — Z20822 Contact with and (suspected) exposure to covid-19: Secondary | ICD-10-CM | POA: Diagnosis not present

## 2019-06-05 DIAGNOSIS — F0280 Dementia in other diseases classified elsewhere without behavioral disturbance: Secondary | ICD-10-CM | POA: Diagnosis present

## 2019-06-05 DIAGNOSIS — Z825 Family history of asthma and other chronic lower respiratory diseases: Secondary | ICD-10-CM

## 2019-06-05 DIAGNOSIS — I110 Hypertensive heart disease with heart failure: Secondary | ICD-10-CM | POA: Diagnosis present

## 2019-06-05 DIAGNOSIS — R0902 Hypoxemia: Secondary | ICD-10-CM

## 2019-06-05 DIAGNOSIS — Z7901 Long term (current) use of anticoagulants: Secondary | ICD-10-CM

## 2019-06-05 DIAGNOSIS — K219 Gastro-esophageal reflux disease without esophagitis: Secondary | ICD-10-CM | POA: Diagnosis present

## 2019-06-05 DIAGNOSIS — F05 Delirium due to known physiological condition: Secondary | ICD-10-CM | POA: Diagnosis not present

## 2019-06-05 DIAGNOSIS — R112 Nausea with vomiting, unspecified: Secondary | ICD-10-CM | POA: Diagnosis not present

## 2019-06-05 DIAGNOSIS — Z886 Allergy status to analgesic agent status: Secondary | ICD-10-CM

## 2019-06-05 DIAGNOSIS — I2510 Atherosclerotic heart disease of native coronary artery without angina pectoris: Secondary | ICD-10-CM | POA: Diagnosis present

## 2019-06-05 DIAGNOSIS — I5032 Chronic diastolic (congestive) heart failure: Secondary | ICD-10-CM | POA: Diagnosis not present

## 2019-06-05 DIAGNOSIS — R42 Dizziness and giddiness: Secondary | ICD-10-CM | POA: Diagnosis not present

## 2019-06-05 DIAGNOSIS — N481 Balanitis: Secondary | ICD-10-CM | POA: Diagnosis present

## 2019-06-05 DIAGNOSIS — J9811 Atelectasis: Secondary | ICD-10-CM | POA: Diagnosis not present

## 2019-06-05 DIAGNOSIS — G309 Alzheimer's disease, unspecified: Secondary | ICD-10-CM | POA: Diagnosis present

## 2019-06-05 DIAGNOSIS — Z823 Family history of stroke: Secondary | ICD-10-CM

## 2019-06-05 DIAGNOSIS — H3530 Unspecified macular degeneration: Secondary | ICD-10-CM | POA: Diagnosis present

## 2019-06-05 DIAGNOSIS — N400 Enlarged prostate without lower urinary tract symptoms: Secondary | ICD-10-CM | POA: Diagnosis present

## 2019-06-05 DIAGNOSIS — J441 Chronic obstructive pulmonary disease with (acute) exacerbation: Secondary | ICD-10-CM | POA: Diagnosis not present

## 2019-06-05 DIAGNOSIS — I50811 Acute right heart failure: Secondary | ICD-10-CM

## 2019-06-05 DIAGNOSIS — L409 Psoriasis, unspecified: Secondary | ICD-10-CM | POA: Diagnosis present

## 2019-06-05 DIAGNOSIS — Z87891 Personal history of nicotine dependence: Secondary | ICD-10-CM

## 2019-06-05 DIAGNOSIS — I739 Peripheral vascular disease, unspecified: Secondary | ICD-10-CM | POA: Diagnosis present

## 2019-06-05 DIAGNOSIS — I495 Sick sinus syndrome: Secondary | ICD-10-CM | POA: Diagnosis present

## 2019-06-05 DIAGNOSIS — I480 Paroxysmal atrial fibrillation: Secondary | ICD-10-CM | POA: Diagnosis present

## 2019-06-05 DIAGNOSIS — Z049 Encounter for examination and observation for unspecified reason: Secondary | ICD-10-CM | POA: Diagnosis not present

## 2019-06-05 DIAGNOSIS — Z888 Allergy status to other drugs, medicaments and biological substances status: Secondary | ICD-10-CM

## 2019-06-05 DIAGNOSIS — E785 Hyperlipidemia, unspecified: Secondary | ICD-10-CM | POA: Diagnosis present

## 2019-06-05 DIAGNOSIS — Z8249 Family history of ischemic heart disease and other diseases of the circulatory system: Secondary | ICD-10-CM

## 2019-06-05 DIAGNOSIS — Z881 Allergy status to other antibiotic agents status: Secondary | ICD-10-CM

## 2019-06-05 DIAGNOSIS — R0602 Shortness of breath: Secondary | ICD-10-CM | POA: Diagnosis not present

## 2019-06-05 HISTORY — DX: Unspecified dementia, unspecified severity, without behavioral disturbance, psychotic disturbance, mood disturbance, and anxiety: F03.90

## 2019-06-05 LAB — POC SARS CORONAVIRUS 2 AG -  ED: SARS Coronavirus 2 Ag: NEGATIVE

## 2019-06-05 LAB — CBC
HCT: 36.3 % — ABNORMAL LOW (ref 39.0–52.0)
Hemoglobin: 11 g/dL — ABNORMAL LOW (ref 13.0–17.0)
MCH: 30.4 pg (ref 26.0–34.0)
MCHC: 30.3 g/dL (ref 30.0–36.0)
MCV: 100.3 fL — ABNORMAL HIGH (ref 80.0–100.0)
Platelets: 157 10*3/uL (ref 150–400)
RBC: 3.62 MIL/uL — ABNORMAL LOW (ref 4.22–5.81)
RDW: 16 % — ABNORMAL HIGH (ref 11.5–15.5)
WBC: 5 10*3/uL (ref 4.0–10.5)
nRBC: 0 % (ref 0.0–0.2)

## 2019-06-05 LAB — URINALYSIS, ROUTINE W REFLEX MICROSCOPIC
Bilirubin Urine: NEGATIVE
Glucose, UA: NEGATIVE mg/dL
Hgb urine dipstick: NEGATIVE
Ketones, ur: 5 mg/dL — AB
Leukocytes,Ua: NEGATIVE
Nitrite: NEGATIVE
Protein, ur: 30 mg/dL — AB
Specific Gravity, Urine: 1.027 (ref 1.005–1.030)
pH: 5 (ref 5.0–8.0)

## 2019-06-05 LAB — HIV ANTIBODY (ROUTINE TESTING W REFLEX): HIV Screen 4th Generation wRfx: NONREACTIVE

## 2019-06-05 LAB — COMPREHENSIVE METABOLIC PANEL
ALT: 29 U/L (ref 0–44)
AST: 24 U/L (ref 15–41)
Albumin: 3.5 g/dL (ref 3.5–5.0)
Alkaline Phosphatase: 53 U/L (ref 38–126)
Anion gap: 9 (ref 5–15)
BUN: 18 mg/dL (ref 8–23)
CO2: 28 mmol/L (ref 22–32)
Calcium: 9.1 mg/dL (ref 8.9–10.3)
Chloride: 108 mmol/L (ref 98–111)
Creatinine, Ser: 0.88 mg/dL (ref 0.61–1.24)
GFR calc Af Amer: >60 mL/min (ref >60)
GFR calc non Af Amer: >60 mL/min (ref >60)
Glucose, Bld: 103 mg/dL — ABNORMAL HIGH (ref 70–99)
Potassium: 4.1 mmol/L (ref 3.5–5.1)
Sodium: 145 mmol/L (ref 135–145)
Total Bilirubin: 0.9 mg/dL (ref 0.3–1.2)
Total Protein: 6.6 g/dL (ref 6.5–8.1)

## 2019-06-05 LAB — BRAIN NATRIURETIC PEPTIDE: B Natriuretic Peptide: 300.3 pg/mL — ABNORMAL HIGH (ref 0.0–100.0)

## 2019-06-05 LAB — LIPASE, BLOOD: Lipase: 17 U/L (ref 11–51)

## 2019-06-05 LAB — RESPIRATORY PANEL BY RT PCR (FLU A&B, COVID)
Influenza A by PCR: NEGATIVE
Influenza B by PCR: NEGATIVE
SARS Coronavirus 2 by RT PCR: NEGATIVE

## 2019-06-05 MED ORDER — IPRATROPIUM BROMIDE HFA 17 MCG/ACT IN AERS
2.0000 | INHALATION_SPRAY | Freq: Once | RESPIRATORY_TRACT | Status: AC
  Administered 2019-06-05: 2 via RESPIRATORY_TRACT
  Filled 2019-06-05: qty 12.9

## 2019-06-05 MED ORDER — ALBUTEROL SULFATE (2.5 MG/3ML) 0.083% IN NEBU: 2.5000 mg | INHALATION_SOLUTION | Freq: Four times a day (QID) | RESPIRATORY_TRACT | Status: DC | PRN

## 2019-06-05 MED ORDER — DONEPEZIL HCL 10 MG PO TABS
10.0000 mg | ORAL_TABLET | Freq: Every day | ORAL | Status: DC
  Administered 2019-06-06 – 2019-06-08 (×3): 10 mg via ORAL
  Filled 2019-06-05 (×4): qty 1

## 2019-06-05 MED ORDER — FINASTERIDE 5 MG PO TABS
5.0000 mg | ORAL_TABLET | Freq: Every day | ORAL | Status: DC
  Administered 2019-06-06 – 2019-06-08 (×3): 5 mg via ORAL
  Filled 2019-06-05 (×3): qty 1

## 2019-06-05 MED ORDER — FUROSEMIDE 10 MG/ML IJ SOLN
20.0000 mg | Freq: Two times a day (BID) | INTRAMUSCULAR | Status: DC
  Administered 2019-06-05 – 2019-06-06 (×2): 20 mg via INTRAVENOUS
  Filled 2019-06-05 (×2): qty 2

## 2019-06-05 MED ORDER — TAMSULOSIN HCL 0.4 MG PO CAPS
0.8000 mg | ORAL_CAPSULE | Freq: Every day | ORAL | Status: DC
  Administered 2019-06-05 – 2019-06-07 (×3): 0.8 mg via ORAL
  Filled 2019-06-05 (×3): qty 2

## 2019-06-05 MED ORDER — LORATADINE 10 MG PO TABS
10.0000 mg | ORAL_TABLET | Freq: Every day | ORAL | Status: DC
  Administered 2019-06-06 – 2019-06-08 (×3): 10 mg via ORAL
  Filled 2019-06-05 (×3): qty 1

## 2019-06-05 MED ORDER — ONDANSETRON HCL 4 MG/2ML IJ SOLN: 4.0000 mg | Freq: Four times a day (QID) | INTRAMUSCULAR | Status: DC | PRN

## 2019-06-05 MED ORDER — IPRATROPIUM-ALBUTEROL 0.5-2.5 (3) MG/3ML IN SOLN
3.0000 mL | Freq: Four times a day (QID) | RESPIRATORY_TRACT | Status: DC
  Administered 2019-06-05 – 2019-06-06 (×3): 3 mL via RESPIRATORY_TRACT
  Filled 2019-06-05 (×3): qty 3

## 2019-06-05 MED ORDER — SODIUM CHLORIDE 0.9 % IV SOLN: 250.0000 mL | INTRAVENOUS | Status: DC | PRN

## 2019-06-05 MED ORDER — METOPROLOL TARTRATE 25 MG PO TABS
25.0000 mg | ORAL_TABLET | Freq: Two times a day (BID) | ORAL | Status: DC
  Administered 2019-06-05 – 2019-06-08 (×6): 25 mg via ORAL
  Filled 2019-06-05 (×6): qty 1

## 2019-06-05 MED ORDER — ROSUVASTATIN CALCIUM 5 MG PO TABS
5.0000 mg | ORAL_TABLET | Freq: Every day | ORAL | Status: DC
  Administered 2019-06-05 – 2019-06-07 (×3): 5 mg via ORAL
  Filled 2019-06-05 (×3): qty 1

## 2019-06-05 MED ORDER — CLOTRIMAZOLE 1 % EX CREA
TOPICAL_CREAM | Freq: Two times a day (BID) | CUTANEOUS | Status: DC
  Administered 2019-06-05: 1 via TOPICAL
  Filled 2019-06-05 (×2): qty 15

## 2019-06-05 MED ORDER — SODIUM CHLORIDE 0.9% FLUSH: 3.0000 mL | INTRAVENOUS | Status: DC | PRN

## 2019-06-05 MED ORDER — PSYLLIUM 95 % PO PACK
1.0000 | PACK | Freq: Every day | ORAL | Status: DC | PRN
  Filled 2019-06-05: qty 1

## 2019-06-05 MED ORDER — ALBUTEROL SULFATE HFA 108 (90 BASE) MCG/ACT IN AERS
6.0000 | INHALATION_SPRAY | RESPIRATORY_TRACT | Status: AC
  Administered 2019-06-05 (×2): 6 via RESPIRATORY_TRACT
  Filled 2019-06-05: qty 6.7

## 2019-06-05 MED ORDER — ISOSORBIDE MONONITRATE ER 30 MG PO TB24
30.0000 mg | ORAL_TABLET | Freq: Every day | ORAL | Status: DC
  Administered 2019-06-06 – 2019-06-08 (×3): 30 mg via ORAL
  Filled 2019-06-05 (×3): qty 1

## 2019-06-05 MED ORDER — METHYLPREDNISOLONE SODIUM SUCC 125 MG IJ SOLR
125.0000 mg | Freq: Once | INTRAMUSCULAR | Status: AC
  Administered 2019-06-05: 125 mg via INTRAVENOUS
  Filled 2019-06-05: qty 2

## 2019-06-05 MED ORDER — SODIUM CHLORIDE 0.9% FLUSH
3.0000 mL | Freq: Two times a day (BID) | INTRAVENOUS | Status: DC
  Administered 2019-06-05 – 2019-06-08 (×5): 3 mL via INTRAVENOUS

## 2019-06-05 MED ORDER — MEMANTINE HCL ER 28 MG PO CP24
28.0000 mg | ORAL_CAPSULE | Freq: Every day | ORAL | Status: DC
  Administered 2019-06-05 – 2019-06-07 (×3): 28 mg via ORAL
  Filled 2019-06-05 (×5): qty 1

## 2019-06-05 MED ORDER — ACETAMINOPHEN 325 MG PO TABS: 650.0000 mg | ORAL_TABLET | ORAL | Status: DC | PRN

## 2019-06-05 MED ORDER — APIXABAN 5 MG PO TABS
5.0000 mg | ORAL_TABLET | Freq: Two times a day (BID) | ORAL | Status: DC
  Administered 2019-06-06 – 2019-06-08 (×5): 5 mg via ORAL
  Filled 2019-06-05 (×7): qty 1

## 2019-06-05 MED ORDER — SODIUM CHLORIDE 0.9% FLUSH
3.0000 mL | Freq: Once | INTRAVENOUS | Status: AC
  Administered 2019-06-05: 3 mL via INTRAVENOUS

## 2019-06-05 NOTE — ED Notes (Signed)
Dinner Tray Ordered @ 1859. 

## 2019-06-05 NOTE — ED Triage Notes (Signed)
Pt here from home with c/o sob and dizziness , pt sats on room air was 84 % placed on 2 liters sats up to 98 % , pt does have copd ,

## 2019-06-05 NOTE — ED Notes (Signed)
Pharmacy paged in reference to missing admitting meds, they advised they would tube them down.

## 2019-06-05 NOTE — ED Notes (Signed)
Lunch Tray Ordered @ 1400.

## 2019-06-05 NOTE — ED Notes (Signed)
Placed on 2L [place] for sats of 85% on RA.

## 2019-06-05 NOTE — ED Notes (Addendum)
Pt's son called and notified to come to bedside.  Son requested that we evaluate the pt's penis.  He believes it is infected.

## 2019-06-05 NOTE — H&P (Addendum)
History and Physical    Brad [name] WHQ:[id number] DOB: 1927/11/08 DOA: 06/05/2019  PCP: Brad Fraise, MD Consultants:  Radford Pax - cardiology; [place] - CT surgery; Macdiarmid - urology; Henrene Pastor - GI Patient coming from:  Home - lives alone with lots of family nearby; NOKBeatrix [name] - [phone]   Chief Complaint: SOB  HPI: Brad [name] is a 86 y.o. male with medical history significant of pacemaker placement; s/p AAA repair; PAF on Eliquis; macular degeneration; HTN; HLD; CAD s/p stent; and COPD presenting with SOB and hypoxia into the 80s.  He had a televisit with his PCP on 1/13 and was sent for COVID testing ([facility]).  He has been having recent difficulty breathing.  He complained of pain with breathing - has been using inhaler.  This AM, he was having trouble with his penis - hasn't been changing his Depends as frequently.  Also with LE edema and vomiting.  He vomited a lot of phlegm this AM.  His son had COVID and has recovered - maybe last month.  They completed the quarantine without contact.  In the mornings he gets choked up sometimes and he has some difficulty with choking with breakfast and dinner.  No fevers.     ED Course:  Respiratory distress - EMS reported in the 6s.  SOB at baseline, now worse and with cough x few days.  Worse today.  Negative PCR at [facility] recently.  Also worsened LE edema.  Wheezing and edema on PE.  Down to 1L at rest, back up to 4L on movement.  BNP pending.  Given nebs with some improvement.  Likely COPD exacerbation, possible CHF component.  Also with balanitis, given Nystatin cream.  Review of Systems: Unable to perform - patient was eating and so not answering questions and clearly had mild to moderate dementia  Ambulatory Status:  Ambulates with a cane  Past Medical History:  Diagnosis Date  . CAD (coronary artery disease)    s/p stent to OM2 in past;  Last LHC 3/05: EF 60%, left RA occluded, right RA ok, prox to mid LAD 60%, oD1 75-80%, oD2  small 75%, pOM1 50%, OM2 stent ok with 50% before stent, mRCA 30%, dRCA 30%, PDA 70%.  Last dobutamine myoview 5/06: no scar or ischemia, EF 60%.  . Chronic lower back pain   . COPD (chronic obstructive pulmonary disease) ([place])   . Dementia ([place])   . DJD (degenerative joint disease)   . Enlarged prostate   . Ethanolism ([place])   . GERD (gastroesophageal reflux disease)   . Hemorrhoids   . Hernia, abdominal    "he has ~ 4; wears binder" (11/20/2015)  . HLD (hyperlipidemia)   . HTN (hypertension)    Last echo 1/07:  Normal LVF, LV thickness upper limits of normal, mild aortic root dilatation, mild to mod MAC, mild LAE, borderline RVH, mild RAE.  . Macular degeneration   . Pacemaker    DR Radford Pax; a. s/p MDT dual chamber PPM with His Bundle pacing  . PAD (peripheral artery disease) ([place])   . Paroxysmal atrial fibrillation (HCC)   . Psoriasis   . S/P AAA repair    Dr. Roxan Hockey  . Scoliosis   . Tobacco abuse   . Unilateral congenital absence of kidney     Past Surgical History:  Procedure Laterality Date  . APPENDECTOMY    . CARPAL TUNNEL RELEASE Right 12/26/2014   Procedure: RIGHT ENDOSCOPIC CARPAL TUNNEL SYNDROME RELEASE;  Surgeon: Milly Jakob, MD;  Location: [place];  Service: Orthopedics;  Laterality: Right;  . CATARACT EXTRACTION, BILATERAL Bilateral   . CORONARY ANGIOPLASTY WITH STENT PLACEMENT     s/p stent to OM2 in past;   . EP IMPLANTABLE DEVICE N/A 11/20/2015   Procedure: Pacemaker Implant;  Surgeon: Evans Lance, MD;  Location: [facility];  Service: Cardiovascular;  Laterality: N/A;  . HERNIA REPAIR    . INSERT / REPLACE / REMOVE PACEMAKER  11/20/2015  . RESECTION AND GRAFTING OF INFARENAL  ABDOMINAL  AORTIC ANEURYSM  05/03/2002    Social History   Socioeconomic History  . Marital status: Widowed    Spouse name: Not on file  . Number of children: 3  . Years of education: Not on file  . Highest education level: Not on file  Occupational  History  . Occupation: retired    Comment: [employer]   Tobacco Use  . Smoking status: Former Smoker    Packs/day: 2.50    Years: 52.00    Pack years: 130.00    Types: Cigarettes    Quit date: 05/06/1996    Years since quitting: 23.0  . Smokeless tobacco: Never Used  Substance and Sexual Activity  . Alcohol use: Yes    Comment: 11/20/2015 "used to drink heavily; quit 05/06/1996"  . Drug use: No  . Sexual activity: Never  Other Topics Concern  . Not on file  Social History Narrative   Right handed      GED      Lives alone. Son lives next door   Social Determinants of Health   Financial Resource Strain:   . Difficulty of Paying Living Expenses: Not on file  Food Insecurity:   . Worried About Charity fundraiser in the Last Year: Not on file  . Ran Out of Food in the Last Year: Not on file  Transportation Needs:   . Lack of Transportation (Medical): Not on file  . Lack of Transportation (Non-Medical): Not on file  Physical Activity:   . Days of Exercise per Week: Not on file  . Minutes of Exercise per Session: Not on file  Stress:   . Feeling of Stress : Not on file  Social Connections:   . Frequency of Communication with Friends and Family: Not on file  . Frequency of Social Gatherings with Friends and Family: Not on file  . Attends Religious Services: Not on file  . Active Member of Clubs or Organizations: Not on file  . Attends Archivist Meetings: Not on file  . Marital Status: Not on file  Intimate Partner Violence:   . Fear of Current or Ex-Partner: Not on file  . Emotionally Abused: Not on file  . Physically Abused: Not on file  . Sexually Abused: Not on file    Allergies  Allergen Reactions  . Aspirin Shortness Of Breath and Swelling  . Atorvastatin Other (See Comments)    Leg pain, feet pain  . Cephalexin Other (See Comments)    Unknown reaction    Family History  Problem Relation Age of Onset  . Cancer Mother        uterine  . Stroke  Mother        hemi plagic  . Cancer Sister   . COPD Sister   . Early death Brother   . Cancer Brother        Lip  . Stroke Father   . AAA (abdominal aortic aneurysm) Son   .  Arthritis Son        knee  . COPD Brother   . Cancer Son        testicular   . Hypertension Son   . Early death Sister     Prior to Admission medications   Medication Sig Start Date End Date Taking? Authorizing Provider  acetaminophen (TYLENOL ARTHRITIS PAIN) 650 MG CR tablet Take 650 mg by mouth 2 (two) times a day.     [provider]  albuterol (VENTOLIN HFA) 108 (90 Base) MCG/ACT inhaler Inhale 2 puffs into the lungs every 6 (six) hours as needed for wheezing or shortness of breath. 06/02/19   Loman Brooklyn, FNP  b complex vitamins tablet Take 1 tablet by mouth daily.    [provider]  Butenafine HCl 1 % cream Apply twice daily to affected areas Patient taking differently: Apply 1 application topically 2 (two) times daily. Apply twice daily to affected areas 11/26/18   Brad Fraise, MD  donepezil (ARICEPT) 10 MG tablet Take 1 tablet daily 02/12/19   Cameron Sprang, MD  ELIQUIS 5 MG TABS tablet TAKE 1 TABLET BY MOUTH TWICE A DAY 04/05/19   Brad Fraise, MD  fexofenadine (ALLEGRA) 180 MG tablet TAKE 1 TABLET (180 MG TOTAL) BY MOUTH DAILY. FOR ALLERGY SYMPTOMS 03/23/19   Brad Fraise, MD  finasteride (PROSCAR) 5 MG tablet TAKE 1 TABLET BY MOUTH EVERY DAY 06/01/19   Brad Fraise, MD  fish oil-omega-3 fatty acids 1000 MG capsule Take 1 g by mouth at bedtime.     [provider]  fluticasone (FLONASE) 50 MCG/ACT nasal spray SPRAY 2 SPRAYS INTO EACH NOSTRIL EVERY DAY Patient taking differently: Place 1 spray into both nostrils daily as needed for allergies.  11/10/17   Brad Fraise, MD  isosorbide mononitrate (IMDUR) 30 MG 24 hr tablet TAKE 1 TABLET (30 MG TOTAL) BY MOUTH DAILY. (NEEDS TO BE SEEN BEFORE NEXT REFILL) 05/19/19   Brad Fraise, MD  KLOR-CON M20 20 MEQ tablet TAKE 1  TABLET BY MOUTH EVERY DAY 06/01/19   Brad Fraise, MD  memantine (NAMENDA XR) 28 MG CP24 24 hr capsule Take 1 capsule (28 mg total) by mouth daily. Needs to be seen for further refills. 04/06/19   Brad Fraise, MD  metoprolol tartrate (LOPRESSOR) 25 MG tablet TAKE 1 TABLET BY MOUTH TWICE A DAY 03/23/19   Brad Fraise, MD  Multiple Vitamin (MULTIVITAMIN WITH MINERALS) TABS tablet Take 1 tablet by mouth every evening. [place]     [provider]  ondansetron (ZOFRAN) 4 MG tablet Take 1 tablet (4 mg total) by mouth every 8 (eight) hours as needed for nausea or vomiting. 06/02/19   Loman Brooklyn, FNP  oxymetazoline (AFRIN 12 HOUR) 0.05 % nasal spray Place 1 spray into both nostrils 2 (two) times daily. 04/29/19   Brad Fraise, MD  polyethylene glycol (MIRALAX) 17 g packet Take 17 g by mouth daily. 12/27/18   Law, Bea Graff, PA-C  psyllium (METAMUCIL SMOOTH TEXTURE) 58.6 % powder Take 1 packet by mouth 3 (three) times daily. 12/27/18   Law, Bea Graff, PA-C  rosuvastatin (CRESTOR) 5 MG tablet TAKE 1 TABLET BY MOUTH EVERY DAY 03/23/19   Brad Fraise, MD  Spacer/Aero-Holding Chambers (AEROCHAMBER MV) inhaler Use as instructed 06/02/19   Loman Brooklyn, FNP  tamsulosin (FLOMAX) 0.4 MG CAPS capsule TAKE 2 CAPSULES (0.8 MG TOTAL) BY MOUTH AT BEDTIME. 03/23/19   Brad Fraise, MD  TURMERIC CURCUMIN PO Take 1 tablet  by mouth at bedtime.     [provider]    Physical Exam: Vitals:   06/05/19 1330 06/05/19 1345 06/05/19 1400 06/05/19 1415  BP: (!) 162/88 (!) 154/96 (!) 163/91 (!) 151/85  Pulse: 70 80 82 78  Resp: 13 14 (!) 0 (!) 22  Temp:      TempSrc:      SpO2: 98% 100% 99% 97%     . General:  Appears calm and comfortable and is NAD, eating lunch without difficulty . Eyes:  PERRL, EOMI, normal lids, iris . ENT:  grossly normal hearing, lips & tongue, mmm; artificial dentition . Neck:  no LAD, thyromegaly; large cystic mass along R posterolateral neck - chronic, not  inflamed . Cardiovascular:  Irregularly irregular but rate controlled, no m/r/g. 2-3+ LE edema.  Marland Kitchen Respiratory:   CTA bilaterally with no wheezes/rales/rhonchi.  Normal respiratory effort.  Comfortable on currently 4L [place] O2. . Abdomen:  soft, NT, ND, NABS . Skin:  no rash or induration seen on limited exam . Musculoskeletal:  grossly normal tone BUE/BLE, good ROM, no bony abnormality . Psychiatric:  grossly normal mood and affect, speech fluent and appropriate, AOx1-2 . Neurologic:  CN 2-12 grossly intact, moves all extremities in coordinated fashion    Radiological Exams on Admission: DG Chest Portable 1 View  Result Date: 06/05/2019 CLINICAL DATA:  Shortness of breath. EXAM: PORTABLE CHEST 1 VIEW COMPARISON:  February 06, 2016. FINDINGS: Stable cardiomediastinal silhouette. Atherosclerosis of thoracic aorta is noted. Left-sided pacemaker is unchanged in position. No pneumothorax or pleural effusion is noted. Minimal bibasilar subsegmental atelectasis is noted. Bony thorax is unremarkable. IMPRESSION: Minimal bibasilar subsegmental atelectasis. Aortic Atherosclerosis (ICD10-I70.0). Electronically Signed   By: Marijo Conception M.D.   On: 06/05/2019 10:20    EKG: Independently reviewed.  Afib with rate 87; nonspecific ST changes with no evidence of acute ischemia   Labs on Admission: I have personally reviewed the available labs and imaging studies at the time of the admission.  Pertinent labs:   Unremarkable CMP WBC 5.0 Hgb 11.0 BD COVID negative; [facility] COVID negative on 12/21 UA: 5 ketones; 30 protein; rare bacteria   Assessment/Plan Principal Problem:   Acute on chronic respiratory failure with hypoxia (HCC) Active Problems:   Essential hypertension   COPD (chronic obstructive pulmonary disease) (HCC)   Hyperlipemia   Atrial fibrillation (HCC)   Dementia due to Alzheimer's disease ([place])   Balanitis   DNR (do not resuscitate)   Acute on chronic respiratory failure with  hypoxia -Patient with recent concern for COPD exacerbation presenting with SOB and hypoxia -He was weaned down to 1L [place] O2 but got up and moved around with worsening hypoxia, now on 4L and very comfortable -Other than LE, generally benign exam at this time and so it is hard to know exactly what led to his presentation -Unlikely COVID given recent negative tests, but will retest with acute respiratory panel since BD has a high false negative rate -Current thought is mild CHF + mild COPD exacerbation  LE edema, concern for subacute CHF -Negative CXR -BNP is pending -2-3+ LE edema -Will place in observation status with telemetry, as there are no current findings necessitating admission (hemodynamic instability, severe electrolyte abnormalities, cardiac arrhythmias, ACS, severe pulmonary edema requiring new O2 therapy, AMS) -Will request echocardiogram -Consider starting ACE depending on Echo results -Continue Lopressor -CHF order set utilized -Will give Lasix 20 mg IV BID -Continue [place] O2 for now -Normal kidney function at this  time, will follow  COPD -Patient recently thought to have acute COPD exacerbation.  -He was given inhalers but there is concern for how he was using them given his dementia -She does not have fever or leukocytosis. Chest x-ray is not consistent with pneumonia -He is not wheezing at this time -Will add Combivent standing and continue prn Albuterol -Will also request ST swallow evaluation to r/o aspiration as part of the issue (negative CXR so will allow patient to continue eating pending evaluation)  Afib, on Eliquis -He is rate controlled with pacemaker and on Lopressor (continued) -Continue Eliquis  Dementia -Continue Aricept and Namenda -The son reports prior sundowning, but the patient responded well to having a chair at the nursing station -The patient's son is willing to come in and stay with him, which I have recommended  Balanitis -Likely resulting  from patient's poor hygienic practices (associated with dementia) -Clotrimazole was added by Dr. Lenoria Chime, will continue  HTN -Continue Lopressor  HLD -Continue Crestor  Goals of care -The patient is DNR at the time of admission -His son reports rapidly progressive dementia  -As such, there are significant concerns about him remaining at home alone -[facility] consult requested regarding HHN vs. placement    Note: This patient has been tested and is negative for the novel coronavirus COVID-19 by BD testing; respiratory panel PCR is pending.  DVT prophylaxis: Eliquis Code Status:  DNR - confirmed with family Family Communication: Son was present throughout evaluation Disposition Plan: To be determined Consults called: TOC team; PT/OT/ST/Nutrition/RT Admission status: It is my clinical opinion that referral for OBSERVATION is reasonable and necessary in this patient based on the above information provided. The aforementioned taken together are felt to place the patient at high risk for further clinical deterioration. However it is anticipated that the patient may be medically stable for discharge from the hospital within 24 to 48 hours.    Karmen Bongo MD [facility]   How to contact the [facility] Attending or Consulting provider [place] or covering provider during after hours [place], for this patient?  1. Check the care team in [facility] and look for a) attending/consulting [facility] provider listed and b) the [facility] team listed 2. Log into [email or web address] and use [place]'s universal password to access. If you do not have the password, please contact the hospital operator. 3. Locate the [facility] provider you are looking for under [facility] and page to a number that you can be directly reached. 4. If you still have difficulty reaching the provider, please page the [facility] (Director on Call) for the Hospitalists listed on amion for assistance.   06/05/2019, 3:34 PM

## 2019-06-05 NOTE — ED Notes (Signed)
Pt off the stretcher has pulled his iv s out  Poop and urine in the floor totally naked  Pt confused he realized that he had no clothes on

## 2019-06-05 NOTE — ED Provider Notes (Signed)
[facility] EMERGENCY DEPARTMENT Provider Note   CSN: [id number] Arrival date & time: 06/05/19  3154     History Chief Complaint  Patient presents with  . Shortness of Breath  . Dizziness    Brad [name] is a 86 y.o. male.  HPI     86 year old with history of CAD, COPD, hypertension, hyperlipidemia, paroxysmal A. fib and AAA comes in a chief complaint of shortness of breath.  Level 5 caveat, patient is a poor historian.  Patient reports that he is having cough and worsening shortness of breath over the last 3 or 4 days.  His cough is mainly dry.  He denies any chest pain.  He also denies any fevers, chills.  He was concerned for COVID-19 infection.  Later in the patient's stay I was able to get in touch with the son.  Son reports that patient lives by himself with family members visiting him daily to manage his ADLs.  They have noted that patient has gotten weaker over the past few days and having more shortness of breath than the baseline shortness of breath.  They are also concerned about swelling around his penis.  Family is also noted worsening swelling in the legs.  Patient denies any UTI-like symptoms or abdominal pain.  He is using depends.   Past Medical History:  Diagnosis Date  . CAD (coronary artery disease)    s/p stent to OM2 in past;  Last LHC 3/05: EF 60%, left RA occluded, right RA ok, prox to mid LAD 60%, oD1 75-80%, oD2 small 75%, pOM1 50%, OM2 stent ok with 50% before stent, mRCA 30%, dRCA 30%, PDA 70%.  Last dobutamine myoview 5/06: no scar or ischemia, EF 60%.  . Chronic lower back pain   . COPD (chronic obstructive pulmonary disease) ([place])   . Dementia ([place])   . DJD (degenerative joint disease)   . Enlarged prostate   . 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 GERD (gastroesophageal reflux disease)   . Hemorrhoids   . Hernia, abdominal    "he has ~ 4; wears binder" (11/20/2015)  . HLD (hyperlipidemia)   . HTN (hypertension)    Last echo 1/07:   Normal LVF, LV thickness upper limits of normal, mild aortic root dilatation, mild to mod MAC, mild LAE, borderline RVH, mild RAE.  . Macular degeneration   . Pacemaker    DR Radford Pax; a. s/p MDT dual chamber PPM with His Bundle pacing  . PAD (peripheral artery disease) ([place])   . Paroxysmal atrial fibrillation (H[facility])   . Psoriasis   . S/P AAA repair    Dr. Roxan Hockey  . Scoliosis   . Tobacco abuse   . Unilateral congenital absence of kidney     Patient Active Problem List   Diagnosis Date Noted  . Acute on chronic respiratory failure ([place]) 06/06/2019  . Acute on chronic respiratory failure with hypoxia ([place]) 06/05/2019  . Balanitis 06/05/2019  . DNR (do not resuscitate) 06/05/2019  . Pacemaker 02/11/2019  . Lower GI bleeding 12/13/2018  . Dementia due to Alzheimer's disease ([place]) 10/02/2018  . Blood in urine 12/04/2016  . Mild dementia ([place]) 07/03/2016  . Atrial fibrillation ([place]) 11/21/2015  . Sinus node dysfunction ([place]) 11/20/2015  . BPH (benign prostatic hyperplasia) 07/07/2015  . Hyperlipemia 07/07/2015  . Ventral hernia 05/31/2013  . Carpal tunnel syndrome, bilateral 05/07/2011  . 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 COPD (chronic obstructive pulmonary disease) ([place])   . ADENOMATOUS COLONIC POLYP 04/18/2008  .  Essential hypertension 04/18/2008  . Coronary atherosclerosis 04/18/2008  . AAA 04/18/2008  . PVD 04/18/2008  . GERD 04/18/2008  . DIVERTICULOSIS, COLON 04/18/2008    Past Surgical History:  Procedure Laterality Date  . APPENDECTOMY    . CARPAL TUNNEL RELEASE Right 12/26/2014   Procedure: RIGHT ENDOSCOPIC CARPAL TUNNEL SYNDROME RELEASE;  Surgeon: Milly Jakob, MD;  Location: [place];  Service: Orthopedics;  Laterality: Right;  . CATARACT EXTRACTION, BILATERAL Bilateral   . CORONARY ANGIOPLASTY WITH STENT PLACEMENT     s/p stent to OM2 in past;   . EP IMPLANTABLE DEVICE N/A 11/20/2015   Procedure: Pacemaker Implant;  Surgeon: Evans Lance, MD;  Location:  [place] [facility];  Service: Cardiovascular;  Laterality: N/A;  . HERNIA REPAIR    . INSERT / REPLACE / REMOVE PACEMAKER  11/20/2015  . RESECTION AND GRAFTING OF INFARENAL  ABDOMINAL  AORTIC ANEURYSM  05/03/2002       Family History  Problem Relation Age of Onset  . Cancer Mother        uterine  . Stroke Mother        hemi plagic  . Cancer Sister   . COPD Sister   . Early death Brother   . Cancer Brother        Lip  . Stroke Father   . AAA (abdominal aortic aneurysm) Son   . Arthritis Son        knee  . COPD Brother   . Cancer Son        testicular   . Hypertension Son   . Early death Sister     Social History   Tobacco Use  . Smoking status: Former Smoker    Packs/day: 2.50    Years: 52.00    Pack years: 130.00    Types: Cigarettes    Quit date: 05/06/1996    Years since quitting: 23.0  . Smokeless tobacco: Never Used  Substance Use Topics  . Alcohol use: Yes    Comment: 11/20/2015 "used to drink heavily; quit 05/06/1996"  . Drug use: No    Home Medications Prior to Admission medications   Medication Sig Start Date End Date Taking? Authorizing Provider  acetaminophen (TYLENOL ARTHRITIS PAIN) 650 MG CR tablet Take 1,300 mg by mouth 2 (two) times a day.    Yes [provider]  albuterol (VENTOLIN HFA) 108 (90 Base) MCG/ACT inhaler Inhale 2 puffs into the lungs every 6 (six) hours as needed for wheezing or shortness of breath. 06/02/19  Yes Hendricks Limes F, FNP  donepezil (ARICEPT) 10 MG tablet Take 1 tablet daily Patient taking differently: Take 10 mg by mouth daily.  02/12/19  Yes Cameron Sprang, MD  ELIQUIS 5 MG TABS tablet TAKE 1 TABLET BY MOUTH TWICE A DAY Patient taking differently: Take 5 mg by mouth 2 (two) times daily.  04/05/19  Yes Stacks, Cletus Gash, MD  fexofenadine (ALLEGRA) 180 MG tablet TAKE 1 TABLET (180 MG TOTAL) BY MOUTH DAILY. FOR ALLERGY SYMPTOMS 03/23/19  Yes Stacks, Cletus Gash, MD  finasteride (PROSCAR) 5 MG tablet TAKE 1 TABLET BY MOUTH  EVERY DAY Patient taking differently: Take 5 mg by mouth daily.  06/01/19  Yes Stacks, Cletus Gash, MD  fluticasone (FLONASE) 50 MCG/ACT nasal spray SPRAY 2 SPRAYS INTO EACH NOSTRIL EVERY DAY Patient taking differently: Place 2 sprays into both nostrils daily as needed for allergies.  11/10/17  Yes Stacks, Cletus Gash, MD  isosorbide mononitrate (IMDUR) 30 MG 24 hr tablet TAKE  1 TABLET (30 MG TOTAL) BY MOUTH DAILY. (NEEDS TO BE SEEN BEFORE NEXT REFILL) 05/19/19  Yes Stacks, Cletus Gash, MD  KLOR-CON M20 20 MEQ tablet TAKE 1 TABLET BY MOUTH EVERY DAY Patient taking differently: Take 20 mEq by mouth daily.  06/01/19  Yes [name], MD  memantine (NAMENDA XR) 28 MG CP24 24 hr capsule Take 1 capsule (28 mg total) by mouth daily. Needs to be seen for further refills. Patient taking differently: Take 28 mg by mouth daily with supper. Needs to be seen for further refills. 04/06/19  Yes Stacks, Cletus Gash, MD  metoprolol tartrate (LOPRESSOR) 25 MG tablet TAKE 1 TABLET BY MOUTH TWICE A DAY Patient taking differently: Take 25 mg by mouth 2 (two) times daily.  03/23/19  Yes [name], MD  ondansetron (ZOFRAN) 4 MG tablet Take 1 tablet (4 mg total) by mouth every 8 (eight) hours as needed for nausea or vomiting. 06/02/19  Yes Loman Brooklyn, FNP  psyllium (METAMUCIL SMOOTH TEXTURE) 58.6 % powder Take 1 packet by mouth 3 (three) times daily. Patient taking differently: Take 1 packet by mouth daily as needed (constipation).  12/27/18  Yes Law, Tuttle, PA-C  rosuvastatin (CRESTOR) 5 MG tablet TAKE 1 TABLET BY MOUTH EVERY DAY Patient taking differently: Take 5 mg by mouth daily with supper.  03/23/19  Yes Stacks, Cletus Gash, MD  tamsulosin (FLOMAX) 0.4 MG CAPS capsule TAKE 2 CAPSULES (0.8 MG TOTAL) BY MOUTH AT BEDTIME. Patient taking differently: Take 0.8 mg by mouth daily with supper.  03/23/19  Yes Stacks, Cletus Gash, MD  Spacer/Aero-Holding Chambers (AEROCHAMBER MV) inhaler Use as instructed 06/02/19   Loman Brooklyn, FNP     Allergies    Aspirin, Atorvastatin, and Cephalexin  Review of Systems   Review of Systems  Unable to perform ROS: Mental status change    Physical Exam Updated Vital Signs BP 122/79 (BP Location: Right Arm)   Pulse 82   Temp 97.8 F (36.6 C) (Axillary)   Resp 18   Ht _0  (1.676 m)   Wt 79.1 kg   SpO2 97%   BMI 28.15 kg/m   Physical Exam Vitals and nursing note reviewed.  Constitutional:      Appearance: He is well-developed. He is not toxic-appearing or diaphoretic.  HENT:     Head: Atraumatic.  Eyes:     Conjunctiva/sclera: Conjunctivae normal.     Pupils: Pupils are equal, round, and reactive to light.  Cardiovascular:     Rate and Rhythm: Normal rate and regular rhythm.  Pulmonary:     Effort: Pulmonary effort is normal.     Breath sounds: Wheezing present.  Abdominal:     General: Bowel sounds are normal. There is no distension.     Palpations: Abdomen is soft. There is no mass.     Tenderness: There is no abdominal tenderness. There is no guarding or rebound.  Musculoskeletal:        General: No deformity.     Cervical back: Normal range of motion and neck supple.     Right lower leg: Edema present.     Left lower leg: Edema present.  Skin:    General: Skin is warm.  Neurological:     Mental Status: He is alert and oriented to person, place, and time.     Comments: Somnolent     ED Results / Procedures / Treatments   Labs (all labs ordered are listed, but only abnormal results are displayed) Labs Reviewed  COMPREHENSIVE  METABOLIC PANEL - Abnormal; Notable for the following components:      Result Value   Glucose, Bld 103 (*)    All other components within normal limits  CBC - Abnormal; Notable for the following components:   RBC 3.62 (*)    Hemoglobin 11.0 (*)    HCT 36.3 (*)    MCV 100.3 (*)    RDW 16.0 (*)    All other components within normal limits  URINALYSIS, ROUTINE W REFLEX MICROSCOPIC - Abnormal; Notable for the following  components:   Ketones, ur 5 (*)    Protein, ur 30 (*)    Bacteria, UA RARE (*)    All other components within normal limits  BRAIN NATRIURETIC PEPTIDE - Abnormal; Notable for the following components:   B Natriuretic Peptide 300.3 (*)    All other components within normal limits  BRAIN NATRIURETIC PEPTIDE - Abnormal; Notable for the following components:   B Natriuretic Peptide 368.1 (*)    All other components within normal limits  CBC WITH DIFFERENTIAL/PLATELET - Abnormal; Notable for the following components:   RBC 3.69 (*)    Hemoglobin 11.4 (*)    HCT 35.7 (*)    RDW 15.7 (*)    Lymphs Abs 0.5 (*)    All other components within normal limits  BASIC METABOLIC PANEL - Abnormal; Notable for the following components:   Glucose, Bld 162 (*)    All other components within normal limits  RESPIRATORY PANEL BY RT PCR (FLU A&B, COVID)  LIPASE, BLOOD  HIV ANTIBODY (ROUTINE TESTING W REFLEX)  POC SARS CORONAVIRUS 2 AG -  ED    EKG EKG Interpretation  Date/Time:  Saturday June 05 2019 09:35:10 EST Ventricular Rate:  87 PR Interval:    QRS Duration: 130 QT Interval:  405 QTC Calculation: 488 R Axis:   -61 Text Interpretation: Atrial fibrillation RBBB and LAFB No acute changes No significant change since last tracing Confirmed by Varney Biles ([id number]) on 06/05/2019 10:29:45 AM   Radiology DG Chest Portable 1 View  Result Date: 06/05/2019 CLINICAL DATA:  Shortness of breath. EXAM: PORTABLE CHEST 1 VIEW COMPARISON:  February 06, 2016. FINDINGS: Stable cardiomediastinal silhouette. Atherosclerosis of thoracic aorta is noted. Left-sided pacemaker is unchanged in position. No pneumothorax or pleural effusion is noted. Minimal bibasilar subsegmental atelectasis is noted. Bony thorax is unremarkable. IMPRESSION: Minimal bibasilar subsegmental atelectasis. Aortic Atherosclerosis (ICD10-I70.0). Electronically Signed   By: Marijo Conception M.D.   On: 06/05/2019 10:20   ECHOCARDIOGRAM  COMPLETE  Result Date: 06/06/2019   ECHOCARDIOGRAM REPORT   Patient Name:   Brad [name] [facility] Date of Exam: 06/06/2019 Medical Rec #:  [id number]       Height:       66.0 in Accession #:    [id number]      Weight:       174.4 lb Date of Birth:  1928/02/16        BSA:          1.89 m Patient Age:    68 years        BP:           153/88 mmHg Patient Gender: M               HR:           87 bpm. Exam Location:  Inpatient Procedure: 2D Echo, Cardiac Doppler and Color Doppler Indications:    I50.33 Acute on chronic diastolic (congestive) heart failure  History:  Patient has prior history of Echocardiogram examinations, most                 recent 06/10/2005. Abnormal ECG and Pacemaker, Arrythmias:Atrial                 Fibrillation; Signs/Symptoms:Altered Mental Status. AAA.  Sonographer:    Roseanna Rainbow RDCS Referring Phys: [id number] JENNIFER YATES  Sonographer Comments: Patient moved througout test. IMPRESSIONS  1. Left ventricular ejection fraction, by visual estimation, is 55 to 60%. The left ventricle has normal function. There is mildly increased left ventricular hypertrophy.  2. Moderate hypokinesis of the left ventricular, basal inferolateral wall.  3. Left ventricular diastolic function could not be evaluated.  4. Mildly dilated left ventricular internal cavity size.  5. The left ventricle demonstrates regional wall motion abnormalities.  6. Global right ventricle has mildly reduced systolic function.The right ventricular size is normal. No increase in right ventricular wall thickness.  7. Left atrial size was moderately dilated.  8. Right atrial size was moderately dilated.  9. Mild to moderate mitral annular calcification. 10. The mitral valve is normal in structure. Mild mitral valve regurgitation. No evidence of mitral stenosis. 11. The tricuspid valve is normal in structure. 12. The aortic valve is tricuspid. Aortic valve regurgitation is mild. Mild aortic valve sclerosis without stenosis. 13. The pulmonic valve  was grossly normal. Pulmonic valve regurgitation is not visualized. 14. Aortic dilatation noted. 15. There is mild to moderate dilatation of the ascending aorta measuring 47 mm. 16. Mildly elevated pulmonary artery systolic pressure. 17. A pacer wire is visualized. FINDINGS  Left Ventricle: Left ventricular ejection fraction, by visual estimation, is 55 to 60%. The left ventricle has normal function. Moderate hypokinesis of the left ventricular, basal inferolateral wall. The left ventricle demonstrates regional wall motion abnormalities. The left ventricular internal cavity size was mildly dilated left ventricle. There is mildly increased left ventricular hypertrophy. Concentric left ventricular hypertrophy. The left ventricular diastology could not be evaluated due to atrial fibrillation. Left ventricular diastolic function could not be evaluated. Right Ventricle: The right ventricular size is normal. No increase in right ventricular wall thickness. Global RV systolic function is has mildly reduced systolic function. The tricuspid regurgitant velocity is 2.64 m/s, and with an assumed right atrial pressure of 8 mmHg, the estimated right ventricular systolic pressure is mildly elevated at 35.9 mmHg. Left Atrium: Left atrial size was moderately dilated. Right Atrium: Right atrial size was moderately dilated Pericardium: There is no evidence of pericardial effusion. Mitral Valve: The mitral valve is normal in structure. Mild to moderate mitral annular calcification. Mild mitral valve regurgitation, with centrally-directed jet. No evidence of mitral valve stenosis by observation. MV peak gradient, 7.7 mmHg. Tricuspid Valve: The tricuspid valve is normal in structure. Tricuspid valve regurgitation mild-moderate. Aortic Valve: The aortic valve is tricuspid. Aortic valve regurgitation is mild. Mild aortic valve sclerosis is present, with no evidence of aortic valve stenosis. Aortic valve mean gradient measures 8.0 mmHg.  Aortic valve peak gradient measures 15.5 mmHg. Aortic valve area, by VTI measures 2.26 cm. Pulmonic Valve: The pulmonic valve was grossly normal. Pulmonic valve regurgitation is not visualized. Pulmonic regurgitation is not visualized. Aorta: Aortic dilatation noted. There is mild to moderate dilatation of the ascending aorta measuring 47 mm. IAS/Shunts: No atrial level shunt detected by color flow Doppler. Additional Comments: A pacer wire is visualized.  LEFT VENTRICLE PLAX 2D LVIDd:         5.80 cm LVIDs:  3.88 cm LV PW:         1.20 cm LV IVS:        1.20 cm LVOT diam:     2.30 cm LV SV:         101 ml LV SV Index:   52.33 LVOT Area:     4.15 cm  LV Volumes (MOD) LV area d, A2C:    23.40 cm LV area d, A4C:    26.40 cm LV area s, A2C:    14.80 cm LV area s, A4C:    14.40 cm LV major d, A2C:   6.38 cm LV major d, A4C:   6.81 cm LV major s, A2C:   5.70 cm LV major s, A4C:   5.91 cm LV vol d, MOD A2C: 73.2 ml LV vol d, MOD A4C: 84.6 ml LV vol s, MOD A2C: 33.5 ml LV vol s, MOD A4C: 28.6 ml LV SV MOD A2C:     39.7 ml LV SV MOD A4C:     84.6 ml LV SV MOD BP:      49.4 ml RIGHT VENTRICLE         IVC TAPSE (M-mode): 1.1 cm  IVC diam: 3.30 cm LEFT ATRIUM              Index       RIGHT ATRIUM           Index LA diam:        4.50 cm  2.39 cm/m  RA Area:     18.90 cm LA Vol (A2C):   106.0 ml 56.18 ml/m RA Volume:   50.00 ml  26.50 ml/m LA Vol (A4C):   60.6 ml  32.12 ml/m LA Biplane Vol: 82.1 ml  43.52 ml/m  AORTIC VALVE AV Area (Vmax):    2.95 cm AV Area (Vmean):   2.96 cm AV Area (VTI):     2.26 cm AV Vmax:           197.00 cm/s AV Vmean:          130.000 cm/s AV VTI:            0.388 m AV Peak Grad:      15.5 mmHg AV Mean Grad:      8.0 mmHg LVOT Vmax:         140.00 cm/s LVOT Vmean:        92.500 cm/s LVOT VTI:          0.211 m LVOT/AV VTI ratio: 0.54  AORTA Ao Root diam: 4.10 cm MITRAL VALVE                        TRICUSPID VALVE MV Area (PHT): 3.37 cm             TR Peak grad:   27.9 mmHg MV Peak  grad:  7.7 mmHg             TR Vmax:        264.00 cm/s MV Mean grad:  3.0 mmHg MV Vmax:       1.39 m/s             SHUNTS MV Vmean:      76.9 cm/s            Systemic VTI:  0.21 m MV VTI:        0.33 m  Systemic Diam: 2.30 cm MV PHT:        65.35 msec MV Decel Time: 225 msec MV E velocity: 118.33 cm/s 103 cm/s  Dani Gobble Croitoru MD Electronically signed by Sanda Klein MD Signature Date/Time: 06/06/2019/11:13:49 AM    Final     Procedures .Critical Care Performed by: Varney Biles, MD Authorized by: Varney Biles, MD   Critical care provider statement:    Critical care time (minutes):  38   Critical care was necessary to treat or prevent imminent or life-threatening deterioration of the following conditions:  Respiratory failure   Critical care was time spent personally by me on the following activities:  Discussions with consultants, evaluation of patient's response to treatment, examination of patient, ordering and performing treatments and interventions, ordering and review of laboratory studies, ordering and review of radiographic studies, pulse oximetry, re-evaluation of patient's condition, obtaining history from patient or surrogate and review of old charts   (including critical care time)  Medications Ordered in ED Medications  clotrimazole (LOTRIMIN) 1 % cream ( Topical Given 06/06/19 1002)  isosorbide mononitrate (IMDUR) 24 hr tablet 30 mg (30 mg Oral Given 06/06/19 0948)  metoprolol tartrate (LOPRESSOR) tablet 25 mg (25 mg Oral Given 06/06/19 0948)  rosuvastatin (CRESTOR) tablet 5 mg (5 mg Oral Given 06/05/19 2336)  donepezil (ARICEPT) tablet 10 mg (10 mg Oral Given 06/06/19 1000)  memantine (NAMENDA XR) 24 hr capsule 28 mg (28 mg Oral Given 06/05/19 2334)  psyllium (HYDROCIL/METAMUCIL) packet 1 packet (has no administration in time range)  finasteride (PROSCAR) tablet 5 mg (5 mg Oral Given 06/06/19 0948)  tamsulosin (FLOMAX) capsule 0.8 mg (0.8 mg Oral Given 06/05/19  2334)  apixaban (ELIQUIS) tablet 5 mg (5 mg Oral Given 06/06/19 0948)  albuterol (PROVENTIL) (2.5 MG/3ML) 0.083% nebulizer solution 2.5 mg (has no administration in time range)  loratadine (CLARITIN) tablet 10 mg (10 mg Oral Given 06/06/19 0949)  sodium chloride flush (NS) 0.9 % injection 3 mL (3 mLs Intravenous Given 06/06/19 0949)  sodium chloride flush (NS) 0.9 % injection 3 mL (has no administration in time range)  0.9 %  sodium chloride infusion (has no administration in time range)  acetaminophen (TYLENOL) tablet 650 mg (has no administration in time range)  ondansetron (ZOFRAN) injection 4 mg (has no administration in time range)  methylPREDNISolone sodium succinate (SOLU-MEDROL) 40 mg/mL injection 40 mg (has no administration in time range)  ipratropium-albuterol (DUONEB) 0.5-2.5 (3) MG/3ML nebulizer solution 3 mL (has no administration in time range)  sodium chloride flush (NS) 0.9 % injection 3 mL (3 mLs Intravenous Given 06/05/19 1414)  albuterol (VENTOLIN HFA) 108 (90 Base) MCG/ACT inhaler 6 puff (6 puffs Inhalation Given 06/05/19 1415)  ipratropium (ATROVENT HFA) inhaler 2 puff (2 puffs Inhalation Given 06/05/19 1233)  methylPREDNISolone sodium succinate (SOLU-MEDROL) 125 mg/2 mL injection 125 mg (125 mg Intravenous Given 06/05/19 1409)    ED Course  I have reviewed the triage vital signs and the nursing notes.  Pertinent labs & imaging results that were available during my care of the patient were reviewed by me and considered in my medical decision making (see chart for details).    MDM Rules/Calculators/A&P                      86 year old comes in a chief complaint of shortness of breath.  He has history of COPD, CAD, AAA and is on Eliquis.  Patient has baseline shortness of breath that has gotten worse.  He is  also having cough.  COVID-19 test was ordered and is negative.  I checked the PCR from outside hospital and also is negative  Lung exam reveals some wheezing.  Patient  was given an inhaler treatment and the wheezing cleared.  Initial suspicion is that patient has COPD exacerbation.  We tried to wean him off of oxygen -but he requires at least 2 L at rest at this time.  We spoke with the family thereafter and there is a possibility of CHF as family reports worsening of pitting edema.  Patient denies any orthopnea, but with exertional dyspnea, this could be indicative of stable angina versus CHF exacerbation.  EKG is reassuring.  He has no chest pain and we have not ordered troponin as we really do not think ACS is higher on the differential diagnosis.  Additionally patient has what appears to be balanitis.   Final Clinical Impression(s) / ED Diagnoses Final diagnoses:  COPD exacerbation ([place])  Balanitis  Hypoxia    Rx / DC Orders ED Discharge Orders    None       Varney Biles, MD 06/06/19 1244

## 2019-06-05 NOTE — ED Notes (Signed)
Pt woke up  atying to get out of bed stating that he wanted to pee.  Bed wet  chNGED HIS CLOTHES AND MOVED HIM INTO ANOTHER BED A REGULAR HOSPITAL BED  FOR COMFORT  PURE WICK REPLACED

## 2019-06-05 NOTE — ED Notes (Signed)
COVID SWAB RESENT

## 2019-06-06 ENCOUNTER — Inpatient Hospital Stay (HOSPITAL_COMMUNITY): Payer: Medicare Other

## 2019-06-06 ENCOUNTER — Other Ambulatory Visit: Payer: Self-pay

## 2019-06-06 DIAGNOSIS — Z66 Do not resuscitate: Secondary | ICD-10-CM | POA: Diagnosis not present

## 2019-06-06 DIAGNOSIS — J410 Simple chronic bronchitis: Secondary | ICD-10-CM | POA: Diagnosis not present

## 2019-06-06 DIAGNOSIS — I340 Nonrheumatic mitral (valve) insufficiency: Secondary | ICD-10-CM | POA: Diagnosis not present

## 2019-06-06 DIAGNOSIS — Z20822 Contact with and (suspected) exposure to covid-19: Secondary | ICD-10-CM | POA: Diagnosis present

## 2019-06-06 DIAGNOSIS — I10 Essential (primary) hypertension: Secondary | ICD-10-CM

## 2019-06-06 DIAGNOSIS — I5032 Chronic diastolic (congestive) heart failure: Secondary | ICD-10-CM | POA: Diagnosis present

## 2019-06-06 DIAGNOSIS — Z7901 Long term (current) use of anticoagulants: Secondary | ICD-10-CM | POA: Diagnosis not present

## 2019-06-06 DIAGNOSIS — F0280 Dementia in other diseases classified elsewhere without behavioral disturbance: Secondary | ICD-10-CM | POA: Diagnosis not present

## 2019-06-06 DIAGNOSIS — I480 Paroxysmal atrial fibrillation: Secondary | ICD-10-CM

## 2019-06-06 DIAGNOSIS — J441 Chronic obstructive pulmonary disease with (acute) exacerbation: Secondary | ICD-10-CM | POA: Diagnosis present

## 2019-06-06 DIAGNOSIS — J9620 Acute and chronic respiratory failure, unspecified whether with hypoxia or hypercapnia: Secondary | ICD-10-CM | POA: Insufficient documentation

## 2019-06-06 DIAGNOSIS — L409 Psoriasis, unspecified: Secondary | ICD-10-CM | POA: Diagnosis present

## 2019-06-06 DIAGNOSIS — J9811 Atelectasis: Secondary | ICD-10-CM | POA: Diagnosis present

## 2019-06-06 DIAGNOSIS — E785 Hyperlipidemia, unspecified: Secondary | ICD-10-CM | POA: Diagnosis present

## 2019-06-06 DIAGNOSIS — Q600 Renal agenesis, unilateral: Secondary | ICD-10-CM | POA: Diagnosis not present

## 2019-06-06 DIAGNOSIS — H3530 Unspecified macular degeneration: Secondary | ICD-10-CM | POA: Diagnosis present

## 2019-06-06 DIAGNOSIS — N400 Enlarged prostate without lower urinary tract symptoms: Secondary | ICD-10-CM | POA: Diagnosis present

## 2019-06-06 DIAGNOSIS — K224 Dyskinesia of esophagus: Secondary | ICD-10-CM | POA: Diagnosis not present

## 2019-06-06 DIAGNOSIS — G309 Alzheimer's disease, unspecified: Secondary | ICD-10-CM | POA: Diagnosis present

## 2019-06-06 DIAGNOSIS — R1312 Dysphagia, oropharyngeal phase: Secondary | ICD-10-CM | POA: Diagnosis not present

## 2019-06-06 DIAGNOSIS — I495 Sick sinus syndrome: Secondary | ICD-10-CM | POA: Diagnosis present

## 2019-06-06 DIAGNOSIS — R1310 Dysphagia, unspecified: Secondary | ICD-10-CM | POA: Diagnosis present

## 2019-06-06 DIAGNOSIS — I361 Nonrheumatic tricuspid (valve) insufficiency: Secondary | ICD-10-CM

## 2019-06-06 DIAGNOSIS — J9621 Acute and chronic respiratory failure with hypoxia: Secondary | ICD-10-CM | POA: Diagnosis present

## 2019-06-06 DIAGNOSIS — N481 Balanitis: Secondary | ICD-10-CM | POA: Diagnosis present

## 2019-06-06 DIAGNOSIS — I50811 Acute right heart failure: Secondary | ICD-10-CM | POA: Diagnosis not present

## 2019-06-06 DIAGNOSIS — I110 Hypertensive heart disease with heart failure: Secondary | ICD-10-CM | POA: Diagnosis present

## 2019-06-06 DIAGNOSIS — R0902 Hypoxemia: Secondary | ICD-10-CM | POA: Diagnosis not present

## 2019-06-06 DIAGNOSIS — Z955 Presence of coronary angioplasty implant and graft: Secondary | ICD-10-CM | POA: Diagnosis not present

## 2019-06-06 DIAGNOSIS — I739 Peripheral vascular disease, unspecified: Secondary | ICD-10-CM | POA: Diagnosis present

## 2019-06-06 DIAGNOSIS — K219 Gastro-esophageal reflux disease without esophagitis: Secondary | ICD-10-CM | POA: Diagnosis present

## 2019-06-06 DIAGNOSIS — G8929 Other chronic pain: Secondary | ICD-10-CM | POA: Diagnosis present

## 2019-06-06 DIAGNOSIS — I2510 Atherosclerotic heart disease of native coronary artery without angina pectoris: Secondary | ICD-10-CM | POA: Diagnosis present

## 2019-06-06 DIAGNOSIS — F05 Delirium due to known physiological condition: Secondary | ICD-10-CM | POA: Diagnosis not present

## 2019-06-06 HISTORY — DX: Acute and chronic respiratory failure, unspecified whether with hypoxia or hypercapnia: J96.20

## 2019-06-06 LAB — CBC WITH DIFFERENTIAL/PLATELET
Abs Immature Granulocytes: 0.02 10*3/uL (ref 0.00–0.07)
Basophils Absolute: 0 10*3/uL (ref 0.0–0.1)
Basophils Relative: 0 %
Eosinophils Absolute: 0 10*3/uL (ref 0.0–0.5)
Eosinophils Relative: 0 %
HCT: 35.7 % — ABNORMAL LOW (ref 39.0–52.0)
Hemoglobin: 11.4 g/dL — ABNORMAL LOW (ref 13.0–17.0)
Immature Granulocytes: 0 %
Lymphocytes Relative: 12 %
Lymphs Abs: 0.5 10*3/uL — ABNORMAL LOW (ref 0.7–4.0)
MCH: 30.9 pg (ref 26.0–34.0)
MCHC: 31.9 g/dL (ref 30.0–36.0)
MCV: 96.7 fL (ref 80.0–100.0)
Monocytes Absolute: 0.1 10*3/uL (ref 0.1–1.0)
Monocytes Relative: 3 %
Neutro Abs: 3.8 10*3/uL (ref 1.7–7.7)
Neutrophils Relative %: 85 %
Platelets: 152 10*3/uL (ref 150–400)
RBC: 3.69 MIL/uL — ABNORMAL LOW (ref 4.22–5.81)
RDW: 15.7 % — ABNORMAL HIGH (ref 11.5–15.5)
WBC: 4.5 10*3/uL (ref 4.0–10.5)
nRBC: 0 % (ref 0.0–0.2)

## 2019-06-06 LAB — BASIC METABOLIC PANEL
Anion gap: 11 (ref 5–15)
BUN: 12 mg/dL (ref 8–23)
CO2: 27 mmol/L (ref 22–32)
Calcium: 9.2 mg/dL (ref 8.9–10.3)
Chloride: 105 mmol/L (ref 98–111)
Creatinine, Ser: 0.87 mg/dL (ref 0.61–1.24)
GFR calc Af Amer: >60 mL/min (ref >60)
GFR calc non Af Amer: >60 mL/min (ref >60)
Glucose, Bld: 162 mg/dL — ABNORMAL HIGH (ref 70–99)
Potassium: 4 mmol/L (ref 3.5–5.1)
Sodium: 143 mmol/L (ref 135–145)

## 2019-06-06 LAB — ECHOCARDIOGRAM COMPLETE
Height: 66 in
Weight: 2790.14 oz

## 2019-06-06 LAB — BRAIN NATRIURETIC PEPTIDE: B Natriuretic Peptide: 368.1 pg/mL — ABNORMAL HIGH (ref 0.0–100.0)

## 2019-06-06 MED ORDER — METHYLPREDNISOLONE SODIUM SUCC 40 MG IJ SOLR
40.0000 mg | Freq: Three times a day (TID) | INTRAMUSCULAR | Status: DC
  Administered 2019-06-06 – 2019-06-08 (×6): 40 mg via INTRAVENOUS
  Filled 2019-06-06 (×6): qty 1

## 2019-06-06 MED ORDER — IPRATROPIUM-ALBUTEROL 0.5-2.5 (3) MG/3ML IN SOLN
3.0000 mL | Freq: Three times a day (TID) | RESPIRATORY_TRACT | Status: DC
  Administered 2019-06-06 (×2): 3 mL via RESPIRATORY_TRACT
  Filled 2019-06-06 (×3): qty 3

## 2019-06-06 MED ORDER — IPRATROPIUM-ALBUTEROL 0.5-2.5 (3) MG/3ML IN SOLN
3.0000 mL | Freq: Four times a day (QID) | RESPIRATORY_TRACT | Status: DC
  Administered 2019-06-06: 3 mL via RESPIRATORY_TRACT
  Filled 2019-06-06: qty 3

## 2019-06-06 NOTE — Plan of Care (Signed)
  Problem: Clinical Measurements: Goal: Ability to maintain clinical measurements within normal limits will improve Outcome: Progressing   Problem: Clinical Measurements: Goal: Respiratory complications will improve Outcome: Progressing   Problem: Activity: Goal: Risk for activity intolerance will decrease Outcome: Progressing   Problem: Coping: Goal: Level of anxiety will decrease Outcome: Progressing   Problem: Safety: Goal: Ability to remain free from injury will improve Outcome: Progressing   Problem: Skin Integrity: Goal: Risk for impaired skin integrity will decrease Outcome: Progressing

## 2019-06-06 NOTE — Progress Notes (Signed)
Home medication list placed in shadow chart.

## 2019-06-06 NOTE — Progress Notes (Signed)
PT Cancellation Note  Patient Details Name: DERICO MCCREA MRN: [id number] DOB: [birth year]   Cancelled Treatment:    Reason Eval/Treat Not Completed: Patient at procedure or test/unavailable Pt getting test in room. Will follow.   Marguarite Arbour A Ebrima Ranta 06/06/2019, 8:40 AM Marisa Severin, PT, DPT Acute Rehabilitation Services Pager [phone] Office [phone]

## 2019-06-06 NOTE — Evaluation (Signed)
Occupational Therapy Evaluation Patient Details Name: Brad [name] MRN: [id number] DOB: [birth year] Today's Date: 06/06/2019    History of Present Illness Pt is an 86 yo male who presents with SOB and hypoxia. Found to have COPD exacerbation with a CHF component. CXR- bibasilar segmental atelectasis. PMHx: dementia, CAD s/p stent, pacemaker placement, s/p AAA repair, paroxysmal A-fib on Eliquis, macular degeneration, HTN, HLD, COPD   Clinical Impression   Pt PTA: unsure of living conditions, but chart says ALF with supportive family. Pt A/O x3; unaware of situation. Pt currently limited for decreased activity tolerance, decreased endurance and poor memory for carry over of skills. O2 >93% on 3L O2. Pt taking very unsteady steps with RW and unable to stay in RW with tactile and verbal cues. Alerted PT for their upcoming session regarding his mobility. Pt minA for mobility and minA overall for ADL. Pt does not require continued OT skilled services as has assist at residence. OT signing acutely.    Follow Up Recommendations  Home health OT;Supervision/Assistance - 24 hour    Equipment Recommendations  None recommended by OT    Recommendations for Other Services       Precautions / Restrictions Precautions Precautions: Fall Precaution Comments: 1:1 sitter Restrictions Weight Bearing Restrictions: No      Mobility Bed Mobility Overal bed mobility: Needs Assistance Bed Mobility: Sidelying to Sit   Sidelying to sit: Min assist       General bed mobility comments: use of rail and scooting to EOB   Transfers Overall transfer level: Needs assistance Equipment used: Rolling walker (2 wheeled) Transfers: Sit to/from Stand Sit to Stand: Min assist         General transfer comment: Assist to power-up; pt usign RW for stability    Balance Overall balance assessment: Needs assistance Sitting-balance support: Feet supported;No upper extremity supported Sitting balance-[name]  Scale: Fair     Standing balance support: During functional activity Standing balance-[name] Scale: Poor Standing balance comment: Requires UE support for static standing and external support for dynamic tasks.                           ADL either performed or assessed with clinical judgement   ADL Overall ADL's : Needs assistance/impaired Eating/Feeding: Set up;Sitting   Grooming: Set up;Sitting;Min guard;Standing Grooming Details (indicate cue type and reason): no physical assist required for tasks; minguardA for stability Upper Body Bathing: Set up;Sitting   Lower Body Bathing: Set up;Sitting/lateral leans   Upper Body Dressing : Set up;Sitting   Lower Body Dressing: Set up;Sitting/lateral leans;Min guard;Sit to/from stand Lower Body Dressing Details (indicate cue type and reason): donning socks at EOB with increased time Toilet Transfer: Min guard;Minimal assistance;RW;Stand-pivot Toilet Transfer Details (indicate cue type and reason): Poor ability to use RW and unsafe without assist for transfer Toileting- Clothing Manipulation and Hygiene: Minimal assistance;Sitting/lateral lean;Sit to/from stand;Cueing for safety       Functional mobility during ADLs: Minimal assistance;Rolling walker General ADL Comments: Pt limited for decreased activity tolerance, decreased endurance and poor memory for carry over of skills.     Vision Baseline Vision/History: Wears glasses Wears Glasses: At all times Patient Visual Report: No change from baseline Vision Assessment?: No apparent visual deficits     Perception     Praxis      Pertinent Vitals/Pain Pain Assessment: No/denies pain     Hand Dominance Right   Extremity/Trunk Assessment Upper Extremity Assessment Upper Extremity Assessment: Generalized  weakness   Lower Extremity Assessment Lower Extremity Assessment: Overall WFL for tasks assessed;Defer to PT evaluation   Cervical / Trunk Assessment Cervical /  Trunk Assessment: Kyphotic   Communication Communication Communication: HOH   Cognition Arousal/Alertness: Awake/alert Behavior During Therapy: WFL for tasks assessed/performed Overall Cognitive Status: Impaired/Different from baseline Area of Impairment: Orientation;Memory;Following commands;Safety/judgement;Problem solving                 Orientation Level: Disoriented to;Time;Situation   Memory: Decreased short-term memory Following Commands: Follows one step commands with increased time Safety/Judgement: Decreased awareness of deficits;Decreased awareness of safety   Problem Solving: Slow processing;Requires verbal cues General Comments: "my memory is gone." Knows he is at [facility] when given options.   General Comments  O2 >93% on 3L O2; pt taking very unsteady steps with RW and unable to stay in RW with tactile and verbal cues. Alerted PT for their session.     Exercises     Shoulder Instructions      Home Living Family/patient expects to be discharged to:: Assisted living Living Arrangements: Alone                           Home Equipment: Gilford Rile - 2 wheels;Cane - single point   Additional Comments: Unable to confirm this due to no family in room      Prior Functioning/Environment Level of Independence: Independent with assistive device(s);Needs assistance  Gait / Transfers Assistance Needed: Reports using cane for mobility.  ADL's / Homemaking Assistance Needed: Sons assist with meal prep and drive pt to appointments. Pt independent with ADLs   Comments: Not sure of PLOF as pt not the best historian and no family members present "my memory is gone"        OT Problem List: Decreased activity tolerance      OT Treatment/Interventions:      OT Goals(Current goals can be found in the care plan section) Acute Rehab OT Goals Patient Stated Goal: none stated  OT Frequency:     Barriers to D/C:            Co-evaluation               AM-PAC OT "6 Clicks" Daily Activity     Outcome Measure Help from another person eating meals?: None Help from another person taking care of personal grooming?: A Little Help from another person toileting, which includes using toliet, bedpan, or urinal?: A Little Help from another person bathing (including washing, rinsing, drying)?: A Little Help from another person to put on and taking off regular upper body clothing?: None Help from another person to put on and taking off regular lower body clothing?: A Little 6 Click Score: 20   End of Session Equipment Utilized During Treatment: Gait belt;Rolling walker Nurse Communication: Mobility status  Activity Tolerance: Patient tolerated treatment well Patient left: in chair;with call bell/phone within reach;with nursing/sitter in room  OT Visit Diagnosis: Unsteadiness on feet (R26.81);Muscle weakness (generalized) (M62.81)                Time: [id number] OT Time Calculation (min): 32 min Charges:  OT General Charges $OT Visit: 1 Visit OT Evaluation $OT Eval Moderate Complexity: 1 Mod OT Treatments $Self Care/Home Management : 8-22 mins  Jefferey Pica OTR/L Acute Rehabilitation Services Pager: [phone] Office: [phone]   Stepanie Graver C 06/06/2019, 4:23 PM

## 2019-06-06 NOTE — Progress Notes (Signed)
Called for a low bed for safety, no low beds available. Will be notified when a bed becomes available.

## 2019-06-06 NOTE — Progress Notes (Addendum)
[facility]  PROGRESS NOTE  Brad [name] [id number] DOB: [birth year] DOA: 06/05/2019 PCP: [name], MD   Brief HPI:   86 year old male with a history of pacemaker placement, status post AAA repair, paroxysmal atrial fibrillation on Eliquis, macular degeneration, hypertension, hyperlipidemia, CAD s/p stent, COPD presented with shortness of breath and hypoxia with O2 sats in 80s.  Put on oxygen 4 L/min, given nystatin cream for balanitis.  Admitted with a working diagnosis of COPD exacerbation with possible CHF component.    Subjective   This morning patient is breathing better, denies chest pain or shortness of breath.   Assessment/Plan:     1. Acute on chronic respiratory failure with hypoxia-likely COPD exacerbation, continue oxygen via nasal cannula.  SARS Covid 2 test is negative.  Likely multifactorial from CHF as well as COPD exacerbation.  Slowly improving.  Continue DuoNeb nebulizers every 6 hours.  Start Solu-Medrol 40 mg IV every 8 hours.  2. Mild CHF exacerbation-BNP is 368, patient has trace edema in the lower extremities.  Chest x-ray is clear.  2D echocardiogram has been obtained.  We will follow the results.  He does not appear to be volume overloaded.  I will hold Lasix at this time.  3. COPD exacerbation-we will start low-dose Solu-Medrol 40 mg IV every 8 hours, continue DuoNeb nebulizers.  Chest x-ray is clear.  4. Paroxysmal atrial fibrillation-patient is currently on Eliquis, heart rate is controlled with Lopressor.  Patient is s/p PPM.  5. Dementia-continue Aricept, Namenda.  6. Balanitis-likely resident, patient's poor hygiene practices, topical clotrimazole twice a day.  7. Hypertension-continue Lopressor  8. Hyperlipidemia-continue Crestor  9. Dysphagia-started on dysphagia 3 diet after speech evaluation.  Will order esophagram to assess history of esophageal cause of dysphagia.    SpO2: 97 % O2 Flow Rate (L/min): 3 L/min   COVID-19  Labs  No results for input(s): DDIMER, FERRITIN, LDH, CRP in the last 72 hours.  Lab Results  Component Value Date   [place] NEGATIVE 06/05/2019   [place] Not Detected 06/02/2019   [place] NEGATIVE 12/13/2018     CBG: No results for input(s): GLUCAP in the last 168 hours.  CBC: Recent Labs  Lab 06/05/19 0957 06/06/19 0302  WBC 5.0 4.5  NEUTROABS  --  3.8  HGB 11.0* 11.4*  HCT 36.3* 35.7*  MCV 100.3* 96.7  PLT 157 0000000    Basic Metabolic Panel: Recent Labs  Lab 06/05/19 0957 06/06/19 0302  NA 145 143  K 4.1 4.0  CL 108 105  CO2 28 27  GLUCOSE 103* 162*  BUN 18 12  CREATININE 0.88 0.87  CALCIUM 9.1 9.2     Liver Function Tests: Recent Labs  Lab 06/05/19 0957  AST 24  ALT 29  ALKPHOS 2  BILITOT 0.9  PROT 6.6  ALBUMIN 3.5        DVT prophylaxis: Apixaban  Code Status: DNR  Family Communication: No family at bedside  Disposition Plan: likely home when medically ready for discharge         Scheduled medications:  . apixaban  5 mg Oral BID  . clotrimazole   Topical BID  . donepezil  10 mg Oral Daily  . finasteride  5 mg Oral Daily  . ipratropium-albuterol  3 mL Inhalation Q6H  . isosorbide mononitrate  30 mg Oral Daily  . loratadine  10 mg Oral Daily  . memantine  28 mg Oral Q supper  . metoprolol tartrate  25 mg Oral BID  .  rosuvastatin  5 mg Oral Q supper  . sodium chloride flush  3 mL Intravenous Q12H  . tamsulosin  0.8 mg Oral Q supper    Consultants:    Procedures:    Antibiotics:   Anti-infectives (From admission, onward)   None       Objective   Vitals:   06/06/19 0405 06/06/19 0541 06/06/19 0717 06/06/19 0942  BP:  (!) 153/88  122/79  Pulse:  76  82  Resp:  20  18  Temp:  98.2 F (36.8 C)  97.8 F (36.6 C)  TempSrc:  Oral  Axillary  SpO2: 99% 98% 94% 97%  Weight:      Height:        Intake/Output Summary (Last 24 hours) at 06/06/2019 1050 Last data filed at 06/06/2019 0534 Gross per  24 hour  Intake 220 ml  Output 1200 ml  Net -980 ml    01/15 1901 - 01/17 0700 In: 220 [P.O.:220] Out: 1200 [Urine:1200]  Filed Weights   06/06/19 0234  Weight: 79.1 kg    Physical Examination:    General-appears in no acute distress  Heart-S1-S2, regular, no murmur auscultated  Lungs-clear to auscultation bilaterally, no wheezing or crackles auscultated  Abdomen-soft, nontender, no organomegaly  Extremities-trace edema in the lower extremities  Neuro-alert, oriented x3, no focal deficit noted    Data Reviewed:   Recent Results (from the past 240 hour(s))  Novel Coronavirus, NAA ([facility])     Status: None   Collection Time: 06/02/19 11:56 AM   Specimen: Nasopharyngeal(NP) swabs in vial transport medium   NASOPHARYNGE  TESTING  Result Value Ref Range Status   SARS-CoV-2, NAA Not Detected Not Detected Final    Comment: This nucleic acid amplification test was developed and its performance characteristics determined by [employer]. Nucleic acid amplification tests include PCR and TMA. This test has not been FDA cleared or approved. This test has been authorized by FDA under an Emergency Use Authorization (EUA). This test is only authorized for the duration of time the declaration that circumstances exist justifying the authorization of the emergency use of in vitro diagnostic tests for detection of SARS-CoV-2 virus and/or diagnosis of COVID-19 infection under section 564(b)(1) of the Act, 21 U.S.C. [id number]) (1), unless the authorization is terminated or revoked sooner. When diagnostic testing is negative, the possibility of a false negative result should be considered in the context of a patient's recent exposures and the presence of clinical signs and symptoms consistent with COVID-19. An individual without symptoms of COVID-19 and who is not shedding SARS-CoV-2 virus would  expect to have a negative (not detected) result in this assay.    Respiratory Panel by RT PCR (Flu A&B, Covid) - Nasopharyngeal Swab     Status: None   Collection Time: 06/05/19 10:35 PM   Specimen: Nasopharyngeal Swab  Result Value Ref Range Status   SARS Coronavirus 2 by RT PCR NEGATIVE NEGATIVE Final    Comment: (NOTE) SARS-CoV-2 target nucleic acids are NOT DETECTED. The SARS-CoV-2 RNA is generally detectable in upper respiratoy specimens during the acute phase of infection. The lowest concentration of SARS-CoV-2 viral copies this assay can detect is 131 copies/mL. A negative result does not preclude SARS-Cov-2 infection and should not be used as the sole basis for treatment or other patient management decisions. A negative result may occur with  improper specimen collection/handling, submission of specimen other than nasopharyngeal swab, presence of viral mutation(s) within the areas targeted by this assay, and inadequate  number of viral copies (<131 copies/mL). A negative result must be combined with clinical observations, patient history, and epidemiological information. The expected result is Negative. Fact Sheet for Patients:  PinkCheek.be Fact Sheet for Healthcare Providers:  GravelBags.it This test is not yet ap proved or cleared by the [place] FDA and  has been authorized for detection and/or diagnosis of SARS-CoV-2 by FDA under an Emergency Use Authorization (EUA). This EUA will remain  in effect (meaning this test can be used) for the duration of the COVID-19 declaration under Section 564(b)(1) of the Act, 21 U.S.C. section 360bbb-3(b)(1), unless the authorization is terminated or revoked sooner.    Influenza A by PCR NEGATIVE NEGATIVE Final   Influenza B by PCR NEGATIVE NEGATIVE Final    Comment: (NOTE) The Xpert Xpress SARS-CoV-2/FLU/RSV assay is intended as an aid in  the diagnosis of influenza from Nasopharyngeal swab specimens and  should not be used as a sole  basis for treatment. Nasal washings and  aspirates are unacceptable for Xpert Xpress SARS-CoV-2/FLU/RSV  testing. Fact Sheet for Patients: PinkCheek.be Fact Sheet for Healthcare Providers: GravelBags.it This test is not yet approved or cleared by the [place] FDA and  has been authorized for detection and/or diagnosis of SARS-CoV-2 by  FDA under an Emergency Use Authorization (EUA). This EUA will remain  in effect (meaning this test can be used) for the duration of the  Covid-19 declaration under Section 564(b)(1) of the Act, 21  U.S.C. section 360bbb-3(b)(1), unless the authorization is  terminated or revoked. Performed at [facility], [place] [address] [place], [place] [address]     Recent Labs  Lab 06/05/19 0957  LIPASE 17    BNP (last 3 results) Recent Labs    06/05/19 0957 06/06/19 0302  BNP 300.3* 368.1*     Studies:  DG Chest Portable 1 View  Result Date: 06/05/2019 CLINICAL DATA:  Shortness of breath. EXAM: PORTABLE CHEST 1 VIEW COMPARISON:  February 06, 2016. FINDINGS: Stable cardiomediastinal silhouette. Atherosclerosis of thoracic aorta is noted. Left-sided pacemaker is unchanged in position. No pneumothorax or pleural effusion is noted. Minimal bibasilar subsegmental atelectasis is noted. Bony thorax is unremarkable. IMPRESSION: Minimal bibasilar subsegmental atelectasis. Aortic Atherosclerosis (ICD10-I70.0). Electronically Signed   By: Marijo Conception M.D.   On: 06/05/2019 10:20     Admission status: Inpatient: Based on patients clinical presentation and evaluation of above clinical data, I have made determination that patient meets Inpatient criteria at this time.   Oswald Hillock   [facility] If 7PM-7AM, please contact night-coverage at [email or web address], Office  [phone]  password [email or web address]  06/06/2019, 10:50 AM  LOS: 0 days

## 2019-06-06 NOTE — Progress Notes (Signed)
  Echocardiogram 2D Echocardiogram has been performed.  Brad [name] 06/06/2019, 9:22 AM

## 2019-06-06 NOTE — Progress Notes (Signed)
Patient arrived to unit, safety sitter present. Patient A&O x2 (disoriented to situation and time).   Cardiac telemetry initiated and verified.  Will continue to monitor.

## 2019-06-06 NOTE — Evaluation (Signed)
Physical Therapy Evaluation Patient Details Name: Brad [name] MRN: [id number] DOB: 02/18/1928 Today's Date: 06/06/2019   History of Present Illness  Pt is an 86 yo male who presents with SOB and hypoxia. Found to have COPD exacerbation with a CHF component. CXR- bibasilar segmental atelectasis. PMHx: dementia, CAD s/p stent, pacemaker placement, s/p AAA repair, paroxysmal A-fib on Eliquis, macular degeneration, HTN, HLD, COPD  Clinical Impression  Patient presents with generalized weakness, impaired balance, dyspnea on exertion, impaired cognition and impaired mobility s/p above. Pt not the best historian so unable to get a good idea of PLOF/history. Per chart, pt has supportive family and uses [facility] for ambulation. Today, pt requires Min A for transfers and gait training due to weakness and instability with use of RW. 2/4 DOE noted. Sp02 >95% on 3L/min 02 [place]. Pt pleasant and confused, "my memory is gone." Would recommend more support at home from family initially until strength/mobility/balance improve as pt is a high fall risk. Will follow acutely to maximize independence and mobility prior to return home.    Follow Up Recommendations Home health PT;Supervision for mobility/OOB    Equipment Recommendations  None recommended by PT    Recommendations for Other Services       Precautions / Restrictions Precautions Precautions: Fall Precaution Comments: 1:1 sitter Restrictions Weight Bearing Restrictions: No      Mobility  Bed Mobility               General bed mobility comments: Up in chair upon PT arrival.  Transfers Overall transfer level: Needs assistance Equipment used: Straight cane Transfers: Sit to/from Stand Sit to Stand: Min assist         General transfer comment: Assist to power to standing using SPC from chair x1.  Ambulation/Gait Ambulation/Gait assistance: Min assist Gait Distance (Feet): 100 Feet Assistive device: Straight cane Gait  Pattern/deviations: Step-through pattern;Wide base of support;Trunk flexed;Decreased stride length Gait velocity: decreased   General Gait Details: Slow, mildly unsteady gait with SPC and wrapping arm around therapist for support; Min A for balance esp with head turns. 2/4 DOE. SP02 95% on 3L/min 02 [place].  Stairs            Wheelchair Mobility    Modified Rankin (Stroke Patients Only)       Balance Overall balance assessment: Needs assistance Sitting-balance support: Feet supported;No upper extremity supported Sitting balance-[name] Scale: Fair     Standing balance support: During functional activity Standing balance-[name] Scale: Poor Standing balance comment: Requires UE support for static standing and external support for dynamic tasks.                             Pertinent Vitals/Pain Pain Assessment: No/denies pain    Home Living Family/patient expects to be discharged to:: Assisted living Living Arrangements: Alone             Home Equipment: Gilford Rile - 2 wheels;Cane - single point Additional Comments: Per chart: Son present and confirmed home information    Prior Function Level of Independence: Independent with assistive device(s);Needs assistance   Gait / Transfers Assistance Needed: Reports using cane for mobility.   ADL's / Homemaking Assistance Needed: Sons assist with meal prep and drive pt to appointments. Pt independent with ADLs  Comments: Not sure of PLOF as pt not the best historian and no family members present "my memory is gone"     Hand Dominance   Dominant Hand: Right  Extremity/Trunk Assessment   Upper Extremity Assessment Upper Extremity Assessment: Defer to OT evaluation    Lower Extremity Assessment Lower Extremity Assessment: Generalized weakness;Difficult to assess due to impaired cognition    Cervical / Trunk Assessment Cervical / Trunk Assessment: Kyphotic  Communication   Communication: HOH  Cognition  Arousal/Alertness: Awake/alert Behavior During Therapy: WFL for tasks assessed/performed Overall Cognitive Status: Impaired/Different from baseline Area of Impairment: Orientation;Memory;Following commands;Safety/judgement;Problem solving                 Orientation Level: Disoriented to;Time;Situation   Memory: Decreased short-term memory Following Commands: Follows one step commands with increased time Safety/Judgement: Decreased awareness of deficits;Decreased awareness of safety   Problem Solving: Slow processing;Requires verbal cues General Comments: "my memory is gone." Knows he is at [facility] when given options.      General Comments General comments (skin integrity, edema, etc.): Did not attempt RW per OT as it did not go well. Pt used to using SPC at baseline despite needing BUE support.    Exercises     Assessment/Plan    PT Assessment Patient needs continued PT services  PT Problem List Decreased strength;Decreased mobility;Decreased balance;Decreased cognition;Cardiopulmonary status limiting activity;Decreased safety awareness       PT Treatment Interventions Therapeutic activities;Gait training;Therapeutic exercise;DME instruction;Patient/family education;Balance training;Functional mobility training    PT Goals (Current goals can be found in the Care Plan section)  Acute Rehab PT Goals Patient Stated Goal: none stated PT Goal Formulation: Patient unable to participate in goal setting Time For Goal Achievement: 06/20/19 Potential to Achieve Goals: Fair    Frequency Min 3X/week   Barriers to discharge Decreased caregiver support not sure of level of support at home    Co-evaluation               AM-PAC PT "6 Clicks" Mobility  Outcome Measure Help needed turning from your back to your side while in a flat bed without using bedrails?: A Little Help needed moving from lying on your back to sitting on the side of a flat bed without using  bedrails?: A Little Help needed moving to and from a bed to a chair (including a wheelchair)?: A Little Help needed standing up from a chair using your arms (e.g., wheelchair or bedside chair)?: A Little Help needed to walk in hospital room?: A Little Help needed climbing 3-5 steps with a railing? : A Lot 6 Click Score: 17    End of Session Equipment Utilized During Treatment: Gait belt Activity Tolerance: Patient tolerated treatment well Patient left: in chair;with call bell/phone within reach;with chair alarm set;with nursing/sitter in room Nurse Communication: Mobility status PT Visit Diagnosis: Unsteadiness on feet (R26.81);Muscle weakness (generalized) (M62.81);Difficulty in walking, not elsewhere classified (R26.2)    Time: [id number] PT Time Calculation (min) (ACUTE ONLY): 17 min   Charges:   PT Evaluation $PT Eval Moderate Complexity: 1 Mod          Marisa Severin, PT, DPT Acute Rehabilitation Services Pager [phone] Office [phone]      Marguarite Arbour A Sabra Heck 06/06/2019, 3:34 PM

## 2019-06-06 NOTE — Evaluation (Signed)
Clinical/Bedside Swallow Evaluation Patient Details  Name: Brad [name] MRN: [id number] Date of Birth: 09-30-1927  Today's Date: 06/06/2019 Time: SLP Start Time (ACUTE ONLY): 1110 SLP Stop Time (ACUTE ONLY): 1124 SLP Time Calculation (min) (ACUTE ONLY): 14 min  Past Medical History:  Past Medical History:  Diagnosis Date  . CAD (coronary artery disease)    s/p stent to OM2 in past;  Last LHC 3/05: EF 60%, left RA occluded, right RA ok, prox to mid LAD 60%, oD1 75-80%, oD2 small 75%, pOM1 50%, OM2 stent ok with 50% before stent, mRCA 30%, dRCA 30%, PDA 70%.  Last dobutamine myoview 5/06: no scar or ischemia, EF 60%.  . Chronic lower back pain   . COPD (chronic obstructive pulmonary disease) ([place])   . Dementia ([place])   . DJD (degenerative joint disease)   . Enlarged prostate   . Ethanolism ([place])   . GERD (gastroesophageal reflux disease)   . Hemorrhoids   . Hernia, abdominal    "he has ~ 4; wears binder" (11/20/2015)  . HLD (hyperlipidemia)   . HTN (hypertension)    Last echo 1/07:  Normal LVF, LV thickness upper limits of normal, mild aortic root dilatation, mild to mod MAC, mild LAE, borderline RVH, mild RAE.  . Macular degeneration   . Pacemaker    DR Radford Pax; a. s/p MDT dual chamber PPM with His Bundle pacing  . PAD (peripheral artery disease) ([place])   . Paroxysmal atrial fibrillation ([facility])   . Psoriasis   . S/P AAA repair    Dr. Roxan Hockey  . Scoliosis   . Tobacco abuse   . Unilateral congenital absence of kidney    Past Surgical History:  Past Surgical History:  Procedure Laterality Date  . APPENDECTOMY    . CARPAL TUNNEL RELEASE Right 12/26/2014   Procedure: RIGHT ENDOSCOPIC CARPAL TUNNEL SYNDROME RELEASE;  Surgeon: Milly Jakob, MD;  Location: [facility];  Service: Orthopedics;  Laterality: Right;  . CATARACT EXTRACTION, BILATERAL Bilateral   . CORONARY ANGIOPLASTY WITH STENT PLACEMENT     s/p stent to OM2 in past;   . EP IMPLANTABLE DEVICE N/A  11/20/2015   Procedure: Pacemaker Implant;  Surgeon: Evans Lance, MD;  Location: [facility];  Service: Cardiovascular;  Laterality: N/A;  . HERNIA REPAIR    . INSERT / REPLACE / REMOVE PACEMAKER  11/20/2015  . RESECTION AND GRAFTING OF INFARENAL  ABDOMINAL  AORTIC ANEURYSM  05/03/2002   HPI:  Brad [name] is a 86 y.o. male with medical history significant of pacemaker placement; s/p AAA repair; PAF on Eliquis; macular degeneration; HTN; HLD; CAD s/p stent; and COPD presenting with SOB and hypoxia into the 80s.  He had a televisit with his PCP on 1/13 and was sent for COVID testing ([facility]).  He has been having recent difficulty breathing.  He complained of pain with breathing - has been using inhaler.  Pt reported that he occasionally gets choked up during breakfast and dinner.  CXR on 06/05/19 reported: "Minimal bibasilar subsegmental atelectasis."    Assessment / Plan / Recommendation Clinical Impression  Pt presents with minimal oral dysphagia and suspected esophageal dysphagia.  Pt was encountered awake/alert and he was pleasant and cooperative throughout this evaluation.  Pt  reported a hx of intermittent reflux; however, he was unable to elaborate further.  Oral mechanism exam was remarkable for top and bottom dentures.  He consumed trials of thin liquid, puree, and regular solids.  Pt exhibited eructation  and delayed throat clearing following 1/4 cup sips of thin liquid and following 2/4 straw sips of thin liquid.  Suspect esophageal etiology with increased risk for post-prandial aspiration.  He additionally presented with prolonged mastication and AP transport with regular solids, but mastication was effective and trace oral residue was cleared with a liquid wash.  Pt reported intermittent globus sensation when consuming regular solids.  Recommend diet change to Dysphagia 3 (soft) solids and continuation of thin liquids with meds whole in puree and full supervision to cue for strict  adherence to the following compensatory strategies: 1) Small bites/sips 2) Slow rate of intake 3) Sit upright 90 degrees 4) Remain upright for 30+ minutes after PO intake.  Pt may also benefit from a GI consult and/or initiation of a PPI such as Protonix.    SLP Visit Diagnosis: Dysphagia, unspecified (R13.10)    Aspiration Risk  Mild aspiration risk    Diet Recommendation Dysphagia 3 (Mech soft);Thin liquid   Liquid Administration via: Straw;Cup Medication Administration: Whole meds with puree Supervision: Staff to assist with self feeding;Full supervision/cueing for compensatory strategies Compensations: Minimize environmental distractions;Slow rate;Small sips/bites Postural Changes: Seated upright at 90 degrees;Remain upright for at least 30 minutes after po intake    Other  Recommendations Recommended Consults: Consider esophageal assessment Oral Care Recommendations: Oral care BID   Follow up Recommendations Other (comment)(TBD)      Frequency and Duration min 2x/week  2 weeks       Prognosis Prognosis for Safe Diet Advancement: [place] Study   General Date of Onset: 06/06/19 HPI: Brad [name] is a 86 y.o. male with medical history significant of pacemaker placement; s/p AAA repair; PAF on Eliquis; macular degeneration; HTN; HLD; CAD s/p stent; and COPD presenting with SOB and hypoxia into the 80s.  He had a televisit with his PCP on 1/13 and was sent for COVID testing ([facility]).  He has been having recent difficulty breathing.  He complained of pain with breathing - has been using inhaler.  Pt reported that he ocassionally gets choked up during breakfast and dinner.  CXR reported: "Minimal bibasilar subsegmental atelectasis."  Type of Study: Bedside Swallow Evaluation Previous Swallow Assessment: None documented  Diet Prior to this Study: Regular;Thin liquids Temperature Spikes Noted: Yes Respiratory Status: Nasal cannula History of Recent Intubation:  No Behavior/Cognition: Alert;Cooperative;Pleasant mood;Confused Oral Cavity Assessment: Within Functional Limits Oral Care Completed by SLP: No Oral Cavity - Dentition: Dentures, top;Dentures, bottom Vision: Functional for self-feeding Self-Feeding Abilities: Needs set up;Able to feed self Patient Positioning: Upright in bed Baseline Vocal Quality: Normal Volitional Cough: Strong Volitional Swallow: Able to elicit    Oral/Motor/Sensory Function Overall Oral Motor/Sensory Function: Within functional limits   Ice Chips Ice chips: Not tested   Thin Liquid Thin Liquid: Impaired Presentation: Straw;Cup;Self Fed Pharyngeal  Phase Impairments: Throat Clearing - Delayed    Nectar Thick Nectar Thick Liquid: Not tested   Honey Thick Honey Thick Liquid: Not tested   Puree Puree: Within functional limits   Solid     Solid: Impaired Presentation: Self Fed Oral Phase Impairments: Impaired mastication Oral Phase Functional Implications: Impaired mastication;Prolonged oral transit     Colin Mulders M.S., CCC-SLP Acute Rehabilitation Services Office: [phone]  Elvia Collum Leyton Magoon 06/06/2019,11:38 AM

## 2019-06-07 ENCOUNTER — Inpatient Hospital Stay (HOSPITAL_COMMUNITY): Payer: Medicare Other

## 2019-06-07 DIAGNOSIS — J410 Simple chronic bronchitis: Secondary | ICD-10-CM

## 2019-06-07 DIAGNOSIS — E785 Hyperlipidemia, unspecified: Secondary | ICD-10-CM

## 2019-06-07 DIAGNOSIS — N481 Balanitis: Secondary | ICD-10-CM

## 2019-06-07 LAB — BASIC METABOLIC PANEL
Anion gap: 7 (ref 5–15)
BUN: 15 mg/dL (ref 8–23)
CO2: 30 mmol/L (ref 22–32)
Calcium: 9.1 mg/dL (ref 8.9–10.3)
Chloride: 104 mmol/L (ref 98–111)
Creatinine, Ser: 0.74 mg/dL (ref 0.61–1.24)
GFR calc Af Amer: >60 mL/min (ref >60)
GFR calc non Af Amer: >60 mL/min (ref >60)
Glucose, Bld: 158 mg/dL — ABNORMAL HIGH (ref 70–99)
Potassium: 4.3 mmol/L (ref 3.5–5.1)
Sodium: 141 mmol/L (ref 135–145)

## 2019-06-07 MED ORDER — ENSURE ENLIVE PO LIQD
237.0000 mL | Freq: Every day | ORAL | Status: DC
  Administered 2019-06-07: 237 mL via ORAL

## 2019-06-07 MED ORDER — ADULT MULTIVITAMIN W/MINERALS CH
1.0000 | ORAL_TABLET | Freq: Every day | ORAL | Status: DC
  Administered 2019-06-07 – 2019-06-08 (×2): 1 via ORAL
  Filled 2019-06-07: qty 1

## 2019-06-07 MED ORDER — IPRATROPIUM-ALBUTEROL 0.5-2.5 (3) MG/3ML IN SOLN: 3.0000 mL | Freq: Four times a day (QID) | RESPIRATORY_TRACT | Status: DC | PRN

## 2019-06-07 NOTE — Progress Notes (Addendum)
PROGRESS NOTE    Brad [name]  [id number]  DOB: [birth year]  PCP: [name], MD Admit date:06/05/2019  86 y.o. male with medical history significant of pacemaker placement; s/p AAA repair; PAF on Eliquis; macular degeneration; HTN; HLD; CAD s/p stent; and COPD presenting with SOB,leg edema and hypoxia into the 80s.  He had a televisit with his PCP on 1/13 and was sent for COVID testing ([facility]).His son recently had COVID and has recovered.Reports some difficulty swallowing/ choking with breakfast and dinner ED Course: Afebrile, noted to be hypoxic,wheezing and edema on PE. O2 requirement 1-2L at rest, back up to 4L on movement.  BNP 368.  Given nebs with some improvement.  Likely COPD exacerbation, possible CHF component.  Also with balanitis, given Nystatin cream Hospital course: Patient admitted to [facility] for   Subjective:  Patient resting in bed comfortably.  Oriented to self only.  Noted to have sitter at bedside.  Apparently was confused last night and was trying to get out of bed.  He is on 2 L O2 today and saturating well.  Objective: Vitals:   06/07/19 0048 06/07/19 0433 06/07/19 0556 06/07/19 0751  BP: 126/82 (!) 175/96 137/72 (!) 159/90  Pulse: 65 71 71 73  Resp: 20 19    Temp: 98.2 F (36.8 C) 98.5 F (36.9 C)  98 F (36.7 C)  TempSrc: Oral Oral  Oral  SpO2: 96% 97%    Weight:  78.3 kg    Height:        Intake/Output Summary (Last 24 hours) at 06/07/2019 0804 Last data filed at 06/07/2019 0501 Gross per 24 hour  Intake 120 ml  Output 2500 ml  Net -2380 ml   Filed Weights   06/06/19 0234 06/07/19 0433  Weight: 79.1 kg 78.3 kg    Physical Examination:  General exam: Appears calm and comfortable, pleasantly confused Respiratory system: Clear to auscultation. Respiratory effort normal. Cardiovascular system: S1 & S2 heard, RRR. No JVD, murmurs. No pedal edema. Gastrointestinal system: Abdomen is nondistended, soft and nontender. Normal BS Central nervous  system: Alert and oriented. No new focal neurological deficits. Extremities: 1+ leg edema, no contractures, joint deformities.  Skin: No rashes, lesions or ulcers Psychiatry: Judgement and insight appear impaired. Mood & affect impulsive  Data Reviewed: I have personally reviewed following labs and imaging studies  CBC: Recent Labs  Lab 06/05/19 0957 06/06/19 0302  WBC 5.0 4.5  NEUTROABS  --  3.8  HGB 11.0* 11.4*  HCT 36.3* 35.7*  MCV 100.3* 96.7  PLT 157 0000000   Basic Metabolic Panel: Recent Labs  Lab 06/05/19 0957 06/06/19 0302 06/07/19 0502  NA 145 143 141  K 4.1 4.0 4.3  CL 108 105 104  CO2 28 27 30   GLUCOSE 103* 162* 158*  BUN 18 12 15   CREATININE 0.88 0.87 0.74  CALCIUM 9.1 9.2 9.1   GFR: Estimated Creatinine Clearance: 61.6 mL/min (by C-G formula based on SCr of 0.74 mg/dL). Liver Function Tests: Recent Labs  Lab 06/05/19 0957  AST 24  ALT 29  ALKPHOS 53  BILITOT 0.9  PROT 6.6  ALBUMIN 3.5   Recent Labs  Lab 06/05/19 0957  LIPASE 17   No results for input(s): AMMONIA in the last 168 hours. Coagulation Profile: No results for input(s): INR, PROTIME in the last 168 hours. Cardiac Enzymes: No results for input(s): CKTOTAL, CKMB, CKMBINDEX, TROPONINI in the last 168 hours. BNP (last 3 results) No results for input(s): PROBNP in the last 8760  hours. HbA1C: No results for input(s): HGBA1C in the last 72 hours. CBG: No results for input(s): GLUCAP in the last 168 hours. Lipid Profile: No results for input(s): CHOL, HDL, LDLCALC, TRIG, CHOLHDL, LDLDIRECT in the last 72 hours. Thyroid Function Tests: No results for input(s): TSH, T4TOTAL, FREET4, T3FREE, THYROIDAB in the last 72 hours. Anemia Panel: No results for input(s): VITAMINB12, FOLATE, FERRITIN, TIBC, IRON, RETICCTPCT in the last 72 hours. Sepsis Labs: No results for input(s): PROCALCITON, LATICACIDVEN in the last 168 hours.  Recent Results (from the past 240 hour(s))  Novel Coronavirus,  NAA ([facility])     Status: None   Collection Time: 06/02/19 11:56 AM   Specimen: Nasopharyngeal(NP) swabs in vial transport medium   NASOPHARYNGE  TESTING  Result Value Ref Range Status   SARS-CoV-2, NAA Not Detected Not Detected Final    Comment: This nucleic acid amplification test was developed and its performance characteristics determined by [employer]. Nucleic acid amplification tests include PCR and TMA. This test has not been FDA cleared or approved. This test has been authorized by FDA under an Emergency Use Authorization (EUA). This test is only authorized for the duration of time the declaration that circumstances exist justifying the authorization of the emergency use of in vitro diagnostic tests for detection of SARS-CoV-2 virus and/or diagnosis of COVID-19 infection under section 564(b)(1) of the Act, 21 U.S.C. [id number]) (1), unless the authorization is terminated or revoked sooner. When diagnostic testing is negative, the possibility of a false negative result should be considered in the context of a patient's recent exposures and the presence of clinical signs and symptoms consistent with COVID-19. An individual without symptoms of COVID-19 and who is not shedding SARS-CoV-2 virus would  expect to have a negative (not detected) result in this assay.   Respiratory Panel by RT PCR (Flu A&B, Covid) - Nasopharyngeal Swab     Status: None   Collection Time: 06/05/19 10:35 PM   Specimen: Nasopharyngeal Swab  Result Value Ref Range Status   SARS Coronavirus 2 by RT PCR NEGATIVE NEGATIVE Final    Comment: (NOTE) SARS-CoV-2 target nucleic acids are NOT DETECTED. The SARS-CoV-2 RNA is generally detectable in upper respiratoy specimens during the acute phase of infection. The lowest concentration of SARS-CoV-2 viral copies this assay can detect is 131 copies/mL. A negative result does not preclude SARS-Cov-2 infection and should not be used as the sole basis for  treatment or other patient management decisions. A negative result may occur with  improper specimen collection/handling, submission of specimen other than nasopharyngeal swab, presence of viral mutation(s) within the areas targeted by this assay, and inadequate number of viral copies (<131 copies/mL). A negative result must be combined with clinical observations, patient history, and epidemiological information. The expected result is Negative. Fact Sheet for Patients:  PinkCheek.be Fact Sheet for Healthcare Providers:  GravelBags.it This test is not yet ap proved or cleared by the [place] FDA and  has been authorized for detection and/or diagnosis of SARS-CoV-2 by FDA under an Emergency Use Authorization (EUA). This EUA will remain  in effect (meaning this test can be used) for the duration of the COVID-19 declaration under Section 564(b)(1) of the Act, 21 U.S.C. section 360bbb-3(b)(1), unless the authorization is terminated or revoked sooner.    Influenza A by PCR NEGATIVE NEGATIVE Final   Influenza B by PCR NEGATIVE NEGATIVE Final    Comment: (NOTE) The Xpert Xpress SARS-CoV-2/FLU/RSV assay is intended as an aid in  the diagnosis of  influenza from Nasopharyngeal swab specimens and  should not be used as a sole basis for treatment. Nasal washings and  aspirates are unacceptable for Xpert Xpress SARS-CoV-2/FLU/RSV  testing. Fact Sheet for Patients: PinkCheek.be Fact Sheet for Healthcare Providers: GravelBags.it This test is not yet approved or cleared by the [place] FDA and  has been authorized for detection and/or diagnosis of SARS-CoV-2 by  FDA under an Emergency Use Authorization (EUA). This EUA will remain  in effect (meaning this test can be used) for the duration of the  Covid-19 declaration under Section 564(b)(1) of the Act, 21  U.S.C. section  360bbb-3(b)(1), unless the authorization is  terminated or revoked. Performed at [facility], [place] [address], [place], [place] [address]       Radiology Studies: DG Chest Portable 1 View  Result Date: 06/05/2019 CLINICAL DATA:  Shortness of breath. EXAM: PORTABLE CHEST 1 VIEW COMPARISON:  February 06, 2016. FINDINGS: Stable cardiomediastinal silhouette. Atherosclerosis of thoracic aorta is noted. Left-sided pacemaker is unchanged in position. No pneumothorax or pleural effusion is noted. Minimal bibasilar subsegmental atelectasis is noted. Bony thorax is unremarkable. IMPRESSION: Minimal bibasilar subsegmental atelectasis. Aortic Atherosclerosis (ICD10-I70.0). Electronically Signed   By: Marijo Conception M.D.   On: 06/05/2019 10:20   ECHOCARDIOGRAM COMPLETE  Result Date: 06/06/2019   ECHOCARDIOGRAM REPORT   Patient Name:   LASEAN BOWLEN [facility] Date of Exam: 06/06/2019 Medical Rec #:  [id number]       Height:       66.0 in Accession #:    [id number]      Weight:       174.4 lb Date of Birth:  09-19-1927        BSA:          1.89 m Patient Age:    32 years        BP:           153/88 mmHg Patient Gender: M               HR:           87 bpm. Exam Location:  Inpatient Procedure: 2D Echo, Cardiac Doppler and Color Doppler Indications:    I50.33 Acute on chronic diastolic (congestive) heart failure  History:        Patient has prior history of Echocardiogram examinations, most                 recent 06/10/2005. Abnormal ECG and Pacemaker, Arrythmias:Atrial                 Fibrillation; Signs/Symptoms:Altered Mental Status. AAA.  Sonographer:    Roseanna Rainbow RDCS Referring Phys: [id number] JENNIFER YATES  Sonographer Comments: Patient moved througout test. IMPRESSIONS  1. Left ventricular ejection fraction, by visual estimation, is 55 to 60%. The left ventricle has normal function. There is mildly increased left ventricular hypertrophy.  2. Moderate hypokinesis of the left ventricular, basal inferolateral wall.  3.  Left ventricular diastolic function could not be evaluated.  4. Mildly dilated left ventricular internal cavity size.  5. The left ventricle demonstrates regional wall motion abnormalities.  6. Global right ventricle has mildly reduced systolic function.The right ventricular size is normal. No increase in right ventricular wall thickness.  7. Left atrial size was moderately dilated.  8. Right atrial size was moderately dilated.  9. Mild to moderate mitral annular calcification. 10. The mitral valve is normal in structure. Mild mitral valve regurgitation. No evidence of mitral stenosis. 11.  The tricuspid valve is normal in structure. 12. The aortic valve is tricuspid. Aortic valve regurgitation is mild. Mild aortic valve sclerosis without stenosis. 13. The pulmonic valve was grossly normal. Pulmonic valve regurgitation is not visualized. 14. Aortic dilatation noted. 15. There is mild to moderate dilatation of the ascending aorta measuring 47 mm. 16. Mildly elevated pulmonary artery systolic pressure. 17. A pacer wire is visualized. FINDINGS  Left Ventricle: Left ventricular ejection fraction, by visual estimation, is 55 to 60%. The left ventricle has normal function. Moderate hypokinesis of the left ventricular, basal inferolateral wall. The left ventricle demonstrates regional wall motion abnormalities. The left ventricular internal cavity size was mildly dilated left ventricle. There is mildly increased left ventricular hypertrophy. Concentric left ventricular hypertrophy. The left ventricular diastology could not be evaluated due to atrial fibrillation. Left ventricular diastolic function could not be evaluated. Right Ventricle: The right ventricular size is normal. No increase in right ventricular wall thickness. Global RV systolic function is has mildly reduced systolic function. The tricuspid regurgitant velocity is 2.64 m/s, and with an assumed right atrial pressure of 8 mmHg, the estimated right ventricular  systolic pressure is mildly elevated at 35.9 mmHg. Left Atrium: Left atrial size was moderately dilated. Right Atrium: Right atrial size was moderately dilated Pericardium: There is no evidence of pericardial effusion. Mitral Valve: The mitral valve is normal in structure. Mild to moderate mitral annular calcification. Mild mitral valve regurgitation, with centrally-directed jet. No evidence of mitral valve stenosis by observation. MV peak gradient, 7.7 mmHg. Tricuspid Valve: The tricuspid valve is normal in structure. Tricuspid valve regurgitation mild-moderate. Aortic Valve: The aortic valve is tricuspid. Aortic valve regurgitation is mild. Mild aortic valve sclerosis is present, with no evidence of aortic valve stenosis. Aortic valve mean gradient measures 8.0 mmHg. Aortic valve peak gradient measures 15.5 mmHg. Aortic valve area, by VTI measures 2.26 cm. Pulmonic Valve: The pulmonic valve was grossly normal. Pulmonic valve regurgitation is not visualized. Pulmonic regurgitation is not visualized. Aorta: Aortic dilatation noted. There is mild to moderate dilatation of the ascending aorta measuring 47 mm. IAS/Shunts: No atrial level shunt detected by color flow Doppler. Additional Comments: A pacer wire is visualized.  LEFT VENTRICLE PLAX 2D LVIDd:         5.80 cm LVIDs:         3.88 cm LV PW:         1.20 cm LV IVS:        1.20 cm LVOT diam:     2.30 cm LV SV:         101 ml LV SV Index:   52.33 LVOT Area:     4.15 cm  LV Volumes (MOD) LV area d, A2C:    23.40 cm LV area d, A4C:    26.40 cm LV area s, A2C:    14.80 cm LV area s, A4C:    14.40 cm LV major d, A2C:   6.38 cm LV major d, A4C:   6.81 cm LV major s, A2C:   5.70 cm LV major s, A4C:   5.91 cm LV vol d, MOD A2C: 73.2 ml LV vol d, MOD A4C: 84.6 ml LV vol s, MOD A2C: 33.5 ml LV vol s, MOD A4C: 28.6 ml LV SV MOD A2C:     39.7 ml LV SV MOD A4C:     84.6 ml LV SV MOD BP:      49.4 ml RIGHT VENTRICLE  IVC TAPSE (M-mode): 1.1 cm  IVC diam: 3.30 cm  LEFT ATRIUM              Index       RIGHT ATRIUM           Index LA diam:        4.50 cm  2.39 cm/m  RA Area:     18.90 cm LA Vol (A2C):   106.0 ml 56.18 ml/m RA Volume:   50.00 ml  26.50 ml/m LA Vol (A4C):   60.6 ml  32.12 ml/m LA Biplane Vol: 82.1 ml  43.52 ml/m  AORTIC VALVE AV Area (Vmax):    2.95 cm AV Area (Vmean):   2.96 cm AV Area (VTI):     2.26 cm AV Vmax:           197.00 cm/s AV Vmean:          130.000 cm/s AV VTI:            0.388 m AV Peak Grad:      15.5 mmHg AV Mean Grad:      8.0 mmHg LVOT Vmax:         140.00 cm/s LVOT Vmean:        92.500 cm/s LVOT VTI:          0.211 m LVOT/AV VTI ratio: 0.54  AORTA Ao Root diam: 4.10 cm MITRAL VALVE                        TRICUSPID VALVE MV Area (PHT): 3.37 cm             TR Peak grad:   27.9 mmHg MV Peak grad:  7.7 mmHg             TR Vmax:        264.00 cm/s MV Mean grad:  3.0 mmHg MV Vmax:       1.39 m/s             SHUNTS MV Vmean:      76.9 cm/s            Systemic VTI:  0.21 m MV VTI:        0.33 m               Systemic Diam: 2.30 cm MV PHT:        65.35 msec MV Decel Time: 225 msec MV E velocity: 118.33 cm/s 103 cm/s  Mihai Croitoru MD Electronically signed by Sanda Klein MD Signature Date/Time: 06/06/2019/11:13:49 AM    Final         Scheduled Meds: . apixaban  5 mg Oral BID  . clotrimazole   Topical BID  . donepezil  10 mg Oral Daily  . finasteride  5 mg Oral Daily  . ipratropium-albuterol  3 mL Inhalation TID  . isosorbide mononitrate  30 mg Oral Daily  . loratadine  10 mg Oral Daily  . memantine  28 mg Oral Q supper  . methylPREDNISolone (SOLU-MEDROL) injection  40 mg Intravenous Q8H  . metoprolol tartrate  25 mg Oral BID  . rosuvastatin  5 mg Oral Q supper  . sodium chloride flush  3 mL Intravenous Q12H  . tamsulosin  0.8 mg Oral Q supper   Continuous Infusions: . sodium chloride      Assessment & Plan:   1. Acute Hypoxic resp failure: likely a combination of COPD/CHF exacerbation. COVID 19 screen PCR/ Flu  test from 1/16 -ve. Continue steroids-will taper and transition to prednisone, continue IV diuresis for another day, taper 02 to off as tolerated.  Currently requiring 2 L at rest.  Hopefully will not need home O2  2. LE edema, concern for subacute CHF-Negative CXR-BNP 368, continue IV diuresis for another day, on Lasix 20mg  IV BID, improving LE edema.Continue Lopressor, taper 02  3. COPD exacerbation: He was given inhalers but there is concern for how he was using them given dementia.He does not have fever or leukocytosis. Chest x-ray is not consistent with pneumonia.He is not wheezing at this time--can change MDI to nebs if needed  4. Paroxysmal Afib: rate controlled with pacemaker and  Lopressor.Continue Eliquis  5.Balanitis-Likely resulting from patient's poor hygienic practices (associated with dementia) -Clotrimazole was added by Dr. Lenoria Chime, will continue  6.HTN-Continue Lopressor  7.HLD-Continue Crestor  8.Dementia with hospital delirium-Continue Aricept and Namenda.Son reports prior sundowning, but patient responding well to conservative measures.Son was willing to come in and stay with him.  Overnight required a sitter, doing well this morning-will transition to video monitoring.  DVT prophylaxis:  Code Status: DNR Family / Patient Communication:  Disposition Plan: lives alone with lots of family nearby; [employer], [name] - [phone] His son reports rapidly progressive dementia .As such, there are significant concerns about him remaining at home alone.TOC consult requested. Seen by physical therapy who recommended home health with 24-hour supervision.     LOS: 1 day    Time spent: [place]    Guilford Shi, MD [facility] Pager [phone]  If 7PM-7AM, please contact night-coverage [email or web address] Password [facility] 06/07/2019, 8:04 AM

## 2019-06-07 NOTE — Progress Notes (Signed)
pts bed alarm not working , facilities notified and said theres no available low bed to switch him to,  [employer] in room will start at 2100 Staff currently sitting  Floor mats and low bed present Will continue to monitor

## 2019-06-07 NOTE — Plan of Care (Signed)
  Problem: Education: Goal: Knowledge of General Education information will improve Description: Including pain rating scale, medication(s)/side effects and non-pharmacologic comfort measures Outcome: Progressing   Problem: Health Behavior/Discharge Planning: Goal: Ability to manage health-related needs will improve Outcome: Progressing   Problem: Safety: Goal: Ability to remain free from injury will improve Outcome: Progressing   

## 2019-06-07 NOTE — Plan of Care (Signed)
  Problem: Health Behavior/Discharge Planning: Goal: Ability to manage health-related needs will improve Outcome: Progressing   Problem: Clinical Measurements: Goal: Ability to maintain clinical measurements within normal limits will improve Outcome: Progressing   

## 2019-06-07 NOTE — Progress Notes (Signed)
Initial Nutrition Assessment  RD working remotely.  DOCUMENTATION CODES:   Not applicable  INTERVENTION:   -MVI with minerals daily -Magic cup BID with meals, each supplement provides 290 kcal and 9 grams of protein -Ensure Enlive po daily, each supplement provides 350 kcal and 20 grams of protein  NUTRITION DIAGNOSIS:   Increased nutrient needs related to chronic illness(COPD) as evidenced by estimated needs.  GOAL:   Patient will meet greater than or equal to 90% of their needs  MONITOR:   PO intake, Supplement acceptance, Labs, Weight trends, Skin, I & O's  REASON FOR ASSESSMENT:   Consult Assessment of nutrition requirement/status  ASSESSMENT:   Brad [name] is a 86 y.o. male with medical history significant of pacemaker placement; s/p AAA repair; PAF on Eliquis; macular degeneration; HTN; HLD; CAD s/p stent; and COPD presenting with SOB and hypoxia into the 80s.  He had a televisit with his PCP on 1/13 and was sent for COVID testing ([facility]).  He has been having recent difficulty breathing.  He complained of pain with breathing - has been using inhaler.  This AM, he was having trouble with his penis - hasn't been changing his Depends as frequently.  Also with LE edema and vomiting.  He vomited a lot of phlegm this AM.  His son had COVID and has recovered - maybe last month.  They completed the quarantine without contact.  In the mornings he gets choked up sometimes and he has some difficulty with choking with breakfast and dinner.  No fevers  Pt admitted with acute respiratory failure with hypoxia secondary to COPD exacerbation.   1/17- s/p BSE- downgrade diet with dysphagia 3 diet with thin liquids  Reviewed I/O's: -2.4 L x 24 hours and -3.4 L since admission  UOP: 2.5 L x 24 hours   Attempted to speak with pt via phone, however, no answer.   Per doc flowsheets, pt with good appetite, noted meal completion 75-100%. Pt's respiratory status has improved per RN  notes, decreased from 3L to 2 L today wit sats WDL.  Reviewed wt hx; noted pt has experienced a 5.8% wt loss over the past 6 months. While this is not significant for time frame, it is concerning given advanced age and increased nutrient needs due to COPD. Pt would greatly benefit from addition of nutritional supplements.   Medications reviewed and include solu-medrol.   Labs reviewed.   Diet Order:   Diet Order            DIET DYS 3 Room service appropriate? Yes with Assist; Fluid consistency: Thin; Fluid restriction: 1800 mL Fluid  Diet effective now              EDUCATION NEEDS:   No education needs have been identified at this time  Skin:  Skin Assessment: Skin Integrity Issues: Skin Integrity Issues:: Other (Comment) Other: MASD distal penis, skin tear on rt and lt hands  Last BM:  06/05/19  Height:   Ht Readings from Last 1 Encounters:  06/06/19 5\' 6"  (1.676 m)    Weight:   Wt Readings from Last 1 Encounters:  06/07/19 78.3 kg    Ideal Body Weight:  64.5 kg  BMI:  Body mass index is 27.86 kg/m.  Estimated Nutritional Needs:   Kcal:  [id number]  Protein:  85-100 grams  Fluid:  1.8 L    Erinn Huskins A. Jimmye Norman, RD, LDN, [place] Registered Dietitian II Certified Diabetes Care and Education Specialist Pager: [phone] After hours  Pager: [phone]

## 2019-06-07 NOTE — Progress Notes (Signed)
Oxygen decreased from 3L to 2L, O2 sats WNL. Will continue to monitor.

## 2019-06-08 ENCOUNTER — Inpatient Hospital Stay (HOSPITAL_COMMUNITY): Payer: Medicare Other

## 2019-06-08 DIAGNOSIS — R1310 Dysphagia, unspecified: Secondary | ICD-10-CM

## 2019-06-08 DIAGNOSIS — I50811 Acute right heart failure: Secondary | ICD-10-CM

## 2019-06-08 DIAGNOSIS — R1312 Dysphagia, oropharyngeal phase: Secondary | ICD-10-CM

## 2019-06-08 LAB — BASIC METABOLIC PANEL
Anion gap: 8 (ref 5–15)
BUN: 19 mg/dL (ref 8–23)
CO2: 33 mmol/L — ABNORMAL HIGH (ref 22–32)
Calcium: 9.1 mg/dL (ref 8.9–10.3)
Chloride: 102 mmol/L (ref 98–111)
Creatinine, Ser: 0.86 mg/dL (ref 0.61–1.24)
GFR calc Af Amer: >60 mL/min (ref >60)
GFR calc non Af Amer: >60 mL/min (ref >60)
Glucose, Bld: 147 mg/dL — ABNORMAL HIGH (ref 70–99)
Potassium: 4.3 mmol/L (ref 3.5–5.1)
Sodium: 143 mmol/L (ref 135–145)

## 2019-06-08 MED ORDER — PREDNISONE 50 MG PO TABS: 50.0000 mg | ORAL_TABLET | Freq: Every day | ORAL | Status: DC

## 2019-06-08 MED ORDER — CLOTRIMAZOLE 1 % EX CREA: TOPICAL_CREAM | Freq: Two times a day (BID) | CUTANEOUS | 0 refills | Status: AC

## 2019-06-08 MED ORDER — COMBIVENT RESPIMAT 20-100 MCG/ACT IN AERS: 1.0000 | INHALATION_SPRAY | Freq: Four times a day (QID) | RESPIRATORY_TRACT | 2 refills | Status: AC | PRN

## 2019-06-08 MED ORDER — PREDNISONE 20 MG PO TABS: 40.0000 mg | ORAL_TABLET | Freq: Every day | ORAL | 0 refills | Status: DC

## 2019-06-08 MED ORDER — FOOD THICKENER (SIMPLYTHICK): 1.0000 | ORAL | 3 refills | Status: AC | PRN

## 2019-06-08 MED ORDER — FUROSEMIDE 20 MG PO TABS: 20.0000 mg | ORAL_TABLET | Freq: Every day | ORAL | 1 refills | Status: AC

## 2019-06-08 NOTE — Discharge Summary (Signed)
Physician Discharge Summary  KANIN LIA ZMO:[id number] DOB: 02/28/1928 DOA: 06/05/2019  PCP: [name], MD  Admit date: 06/05/2019 Discharge date: 06/08/2019 Consultations: none Admitted From: home Disposition: home  Discharge Diagnoses:  Principal Problem:   Acute on chronic respiratory failure with hypoxia ([place]) Active Problems:   COPD (chronic obstructive pulmonary disease) ([place])   Dysphagia   Acute right-sided CHF (congestive heart failure) ([place])   Essential hypertension   Hyperlipemia   Atrial fibrillation ([place])   Dementia due to Alzheimer's disease ([place])   Balanitis   DNR (do not resuscitate)  Hospital Course Summary: 86 y.o.malewith medical history significant ofpacemaker placement; s/p AAA repair; PAF on Eliquis; macular degeneration; HTN; HLD; CAD s/p stent; and COPD presenting withSOB,leg edema and hypoxia into the 80s. He had a televisit with his PCP on 1/13 and was sent for COVID testing ([facility]).His son recently had COVID and has recovered. Son also reported noting patient to have some difficulty swallowing/ choking with breakfast and dinner ED Course: Afebrile, noted to be hypoxic,wheezing and edema on PE. O2 requirement 1-2L at rest, back up to 4L on movement. BNP 368. Given nebs with some improvement. CXR with bibasilar subsegmental atelectasis.Also noted to have inflammation around glans penis, balanitis, given Nystatin cream Hospital course: Patient admitted to [facility] for further evaluation and management of possible COPD/CHF exacerbation with speech therapy consult to r/o aspiration.   1. Acute Hypoxic resp failure: initially felt to be a combination of COPD/CHF exacerbation and admitted with IV Lasix and steroids. COVID 19 screen PCR/ Flu test from 1/16 resulted negative.  Patient was continued on IV diuretics as well as IV Solu-Medrol and, tapered 02 to off as tolerated.  This morning he has been saturating well on room air.  Patient also noted to be  coughing with food intake while here, seen by speech therapy and recommended dysphagia 2 diet with thickened liquids, meds to be crushed and given in applesauce.  Retrospectively, patient likely had aspiration component as well on presentation contributing to hypoxia.  He is tolerating diet modifications well and ambulatory desat studies obtained by RN this afternoon-patient able to maintain sats greater than 92% at rest and on ambulation.  Does not need home O2.  Discussed with son and advised regarding diet modifications.  Will complete prednisone course with 2 more days of therapy as outpatient.  2. Leg edema, concern for right sided CHF-BNP 368 but chest x-ray did not show any pulmonary edema.  Patient did have lower extremity edema on presentation and echo shows normal LV function, mildly reduced RV systolic function.  Will transition IV diuretics to oral Lasix 20 daily to take at home for at least a week subsequent to which he can take as needed as explained to son.  PCP to follow-up and reassess fluid status and diuretic recommendations.  3. COPD exacerbation: He was given inhalers but there is concern for how he was using them given dementia.Apparently was prescribed steroid inhalers in the past but discontinued as he was using them as needed.  Currently only on albuterol as needed at home.  Will change it to Combivent MDI.  He does not have fever or leukocytosis. Chest x-ray is not consistent with pneumonia.Prednisone 40 mg for another 2 days upon discharge.  4. Paroxysmal Afib: rate controlled with pacemaker and  Lopressor.Continue Eliquis  5.Balanitis-Likely resulting from patient's poor hygienic practices (associated with dementia) -Clotrimazole was added on presentation, currently no complaints and improving.  6.HTN-Continue Lopressor  7.HLD-Continue Crestor  8.Dementia  with hospital delirium-patient noted to have confusion and impulsive behavior at night consistent with sundowning  and possibly a component of delirium in the setting of hospitalization, steroid use.  Continue Aricept and Namenda.Son reports prior sundowning, but patient responded well to conservative measures.he probably will do better at home in familiar surroundings.  Discussed with son who stated patient will be living with him for foreseeable future.  Home health services including home PT, speech therapy referrals given.    Discharge Exam:  Vitals:   06/08/19 1014 06/08/19 1107  BP: (!) 146/92 (!) 147/92  Pulse: 86 74  Resp:  20  Temp:  97.8 F (36.6 C)  SpO2: 98% 99%   Vitals:   06/08/19 0449 06/08/19 0526 06/08/19 1014 06/08/19 1107  BP:  (!) 155/90 (!) 146/92 (!) 147/92  Pulse:  63 86 74  Resp:  16  20  Temp:  98.4 F (36.9 C)  97.8 F (36.6 C)  TempSrc:  Oral  Oral  SpO2:  97% 98% 99%  Weight: 73 kg     Height:        General: Pt is alert, awake, not in acute distress Cardiovascular: RRR, S1/S2 +, no rubs, no gallops Respiratory: CTA bilaterally, no wheezing, no rhonchi Abdominal: Soft, NT, ND, bowel sounds + Extremities: Almost resolved edema, no cyanosis  Discharge Condition:Stable CODE STATUS: DNR Diet recommendation: Low-salt, dysphagia 2 diet with thickened liquids Recommendations for Outpatient Follow-up:  1. Follow up with PCP: 1 week 2. Follow up with consultants:  3. Please obtain follow up labs including: [place] services upon discharge: Yes Equipment/Devices upon discharge: None   Discharge Instructions:  Discharge Instructions    ([place]) Call MD:  Anytime you have any of the following symptoms: 1) 3 pound weight gain in 24 hours or 5 pounds in 1 week 2) shortness of breath, with or without a dry hacking cough 3) swelling in the hands, feet or stomach 4) if you have to sleep on extra pillows at night in order to breathe.   Complete by: As directed    Call MD for:  difficulty breathing, headache or visual disturbances   Complete by:  As directed    Call MD for:  persistant dizziness or light-headedness   Complete by: As directed    Call MD for:  persistant nausea and vomiting   Complete by: As directed    Call MD for:  temperature >100.4   Complete by: As directed    Diet - low sodium heart healthy   Complete by: As directed    Increase activity slowly   Complete by: As directed    Increase activity slowly   Complete by: As directed      Allergies as of 06/08/2019      Reactions   Aspirin Shortness Of Breath, Swelling   Atorvastatin Other (See Comments)   Leg pain, feet pain   Cephalexin Other (See Comments)   Unknown reaction      Medication List    STOP taking these medications   albuterol 108 (90 Base) MCG/ACT inhaler Commonly known as: VENTOLIN HFA     TAKE these medications   AeroChamber MV inhaler Use as instructed   clotrimazole 1 % cream Commonly known as: LOTRIMIN Apply topically 2 (two) times daily for 7 days.   Combivent Respimat 20-100 MCG/ACT Aers respimat Generic drug: Ipratropium-Albuterol Inhale 1 puff into the lungs every 6 (six) hours as needed for wheezing or shortness of breath.  donepezil 10 MG tablet Commonly known as: ARICEPT Take 1 tablet daily What changed:   how much to take  how to take this  when to take this  additional instructions   Eliquis 5 MG Tabs tablet Generic drug: apixaban TAKE 1 TABLET BY MOUTH TWICE A DAY What changed: how much to take   fexofenadine 180 MG tablet Commonly known as: ALLEGRA TAKE 1 TABLET (180 MG TOTAL) BY MOUTH DAILY. FOR ALLERGY SYMPTOMS   finasteride 5 MG tablet Commonly known as: PROSCAR TAKE 1 TABLET BY MOUTH EVERY DAY   fluticasone 50 MCG/ACT nasal spray Commonly known as: FLONASE SPRAY 2 SPRAYS INTO EACH NOSTRIL EVERY DAY What changed: See the new instructions.   food thickener Powd Commonly known as: SIMPLYTHICK Take 1 packet by mouth as needed.   furosemide 20 MG tablet Commonly known as: Lasix Take 1  tablet (20 mg total) by mouth daily.   isosorbide mononitrate 30 MG 24 hr tablet Commonly known as: IMDUR TAKE 1 TABLET (30 MG TOTAL) BY MOUTH DAILY. (NEEDS TO BE SEEN BEFORE NEXT REFILL)   Klor-Con M20 20 MEQ tablet Generic drug: potassium chloride SA TAKE 1 TABLET BY MOUTH EVERY DAY What changed: how much to take   memantine 28 MG Cp24 24 hr capsule Commonly known as: NAMENDA XR Take 1 capsule (28 mg total) by mouth daily. Needs to be seen for further refills. What changed: when to take this   Metamucil Smooth Texture 58.6 % powder Generic drug: psyllium Take 1 packet by mouth 3 (three) times daily. What changed:   when to take this  reasons to take this   metoprolol tartrate 25 MG tablet Commonly known as: LOPRESSOR TAKE 1 TABLET BY MOUTH TWICE A DAY   ondansetron 4 MG tablet Commonly known as: Zofran Take 1 tablet (4 mg total) by mouth every 8 (eight) hours as needed for nausea or vomiting.   predniSONE 20 MG tablet Commonly known as: DELTASONE Take 2 tablets (40 mg total) by mouth daily.   rosuvastatin 5 MG tablet Commonly known as: CRESTOR TAKE 1 TABLET BY MOUTH EVERY DAY What changed: when to take this   tamsulosin 0.4 MG Caps capsule Commonly known as: FLOMAX TAKE 2 CAPSULES (0.8 MG TOTAL) BY MOUTH AT BEDTIME. What changed: when to take this   Tylenol Arthritis Pain 650 MG CR tablet Generic drug: acetaminophen Take 1,300 mg by mouth 2 (two) times a day.       Allergies  Allergen Reactions  . Aspirin Shortness Of Breath and Swelling  . Atorvastatin Other (See Comments)    Leg pain, feet pain  . Cephalexin Other (See Comments)    Unknown reaction      The results of significant diagnostics from this hospitalization (including imaging, microbiology, ancillary and laboratory) are listed below for reference.    Labs: BNP (last 3 results) Recent Labs    06/05/19 0957 06/06/19 0302  BNP 300.3* 202.5*   Basic Metabolic Panel: Recent Labs   Lab 06/05/19 0957 06/06/19 0302 06/07/19 0502 06/08/19 0611  NA 145 143 141 143  K 4.1 4.0 4.3 4.3  CL 108 105 104 102  CO2 _0 33*  GLUCOSE 103* 162* 158* 147*  BUN _1 CREATININE 0.88 0.87 0.74 0.86  CALCIUM 9.1 9.2 9.1 9.1   Liver Function Tests: Recent Labs  Lab 06/05/19 0957  AST 24  ALT 29  ALKPHOS 53  BILITOT 0.9  PROT 6.6  ALBUMIN 3.5  Recent Labs  Lab 06/05/19 0957  LIPASE 17   No results for input(s): AMMONIA in the last 168 hours. CBC: Recent Labs  Lab 06/05/19 0957 06/06/19 0302  WBC 5.0 4.5  NEUTROABS  --  3.8  HGB 11.0* 11.4*  HCT 36.3* 35.7*  MCV 100.3* 96.7  PLT 157 152   Cardiac Enzymes: No results for input(s): CKTOTAL, CKMB, CKMBINDEX, TROPONINI in the last 168 hours. BNP: Invalid input(s): POCBNP CBG: No results for input(s): GLUCAP in the last 168 hours. D-Dimer No results for input(s): DDIMER in the last 72 hours. Hgb A1c No results for input(s): HGBA1C in the last 72 hours. Lipid Profile No results for input(s): CHOL, HDL, LDLCALC, TRIG, CHOLHDL, LDLDIRECT in the last 72 hours. Thyroid function studies No results for input(s): TSH, T4TOTAL, T3FREE, THYROIDAB in the last 72 hours.  Invalid input(s): FREET3 Anemia work up No results for input(s): VITAMINB12, FOLATE, FERRITIN, TIBC, IRON, RETICCTPCT in the last 72 hours. Urinalysis    Component Value Date/Time   COLORURINE YELLOW 06/05/2019 0957   APPEARANCEUR CLEAR 06/05/2019 0957   APPEARANCEUR Clear 04/29/2019 1401   LABSPEC 1.027 06/05/2019 0957   PHURINE 5.0 06/05/2019 0957   GLUCOSEU NEGATIVE 06/05/2019 0957   HGBUR NEGATIVE 06/05/2019 0957   BILIRUBINUR NEGATIVE 06/05/2019 0957   BILIRUBINUR Negative 04/29/2019 1401   KETONESUR 5 (A) 06/05/2019 0957   PROTEINUR 30 (A) 06/05/2019 0957   UROBILINOGEN negative 07/07/2015 1054   NITRITE NEGATIVE 06/05/2019 0957   LEUKOCYTESUR NEGATIVE 06/05/2019 0957   Sepsis Labs Invalid input(s): PROCALCITONIN,   WBC,  LACTICIDVEN Microbiology Recent Results (from the past 240 hour(s))  Novel Coronavirus, NAA ([facility])     Status: None   Collection Time: 06/02/19 11:56 AM   Specimen: Nasopharyngeal(NP) swabs in vial transport medium   NASOPHARYNGE  TESTING  Result Value Ref Range Status   SARS-CoV-2, NAA Not Detected Not Detected Final    Comment: This nucleic acid amplification test was developed and its performance characteristics determined by [employer]. Nucleic acid amplification tests include PCR and TMA. This test has not been FDA cleared or approved. This test has been authorized by FDA under an Emergency Use Authorization (EUA). This test is only authorized for the duration of time the declaration that circumstances exist justifying the authorization of the emergency use of in vitro diagnostic tests for detection of SARS-CoV-2 virus and/or diagnosis of COVID-19 infection under section 564(b)(1) of the Act, 21 U.S.C. 676PPJ-0(D) (1), unless the authorization is terminated or revoked sooner. When diagnostic testing is negative, the possibility of a false negative result should be considered in the context of a patient's recent exposures and the presence of clinical signs and symptoms consistent with COVID-19. An individual without symptoms of COVID-19 and who is not shedding SARS-CoV-2 virus would  expect to have a negative (not detected) result in this assay.   Respiratory Panel by RT PCR (Flu A&B, Covid) - Nasopharyngeal Swab     Status: None   Collection Time: 06/05/19 10:35 PM   Specimen: Nasopharyngeal Swab  Result Value Ref Range Status   SARS Coronavirus 2 by RT PCR NEGATIVE NEGATIVE Final    Comment: (NOTE) SARS-CoV-2 target nucleic acids are NOT DETECTED. The SARS-CoV-2 RNA is generally detectable in upper respiratoy specimens during the acute phase of infection. The lowest concentration of SARS-CoV-2 viral copies this assay can detect is 131 copies/mL. A  negative result does not preclude SARS-Cov-2 infection and should not be used as the sole basis for treatment  or other patient management decisions. A negative result may occur with  improper specimen collection/handling, submission of specimen other than nasopharyngeal swab, presence of viral mutation(s) within the areas targeted by this assay, and inadequate number of viral copies (<131 copies/mL). A negative result must be combined with clinical observations, patient history, and epidemiological information. The expected result is Negative. Fact Sheet for Patients:  PinkCheek.be Fact Sheet for Healthcare Providers:  GravelBags.it This test is not yet ap proved or cleared by the [place] FDA and  has been authorized for detection and/or diagnosis of SARS-CoV-2 by FDA under an Emergency Use Authorization (EUA). This EUA will remain  in effect (meaning this test can be used) for the duration of the COVID-19 declaration under Section 564(b)(1) of the Act, 21 U.S.C. section 360bbb-3(b)(1), unless the authorization is terminated or revoked sooner.    Influenza A by PCR NEGATIVE NEGATIVE Final   Influenza B by PCR NEGATIVE NEGATIVE Final    Comment: (NOTE) The Xpert Xpress SARS-CoV-2/FLU/RSV assay is intended as an aid in  the diagnosis of influenza from Nasopharyngeal swab specimens and  should not be used as a sole basis for treatment. Nasal washings and  aspirates are unacceptable for Xpert Xpress SARS-CoV-2/FLU/RSV  testing. Fact Sheet for Patients: PinkCheek.be Fact Sheet for Healthcare Providers: GravelBags.it This test is not yet approved or cleared by the [place] FDA and  has been authorized for detection and/or diagnosis of SARS-CoV-2 by  FDA under an Emergency Use Authorization (EUA). This EUA will remain  in effect (meaning this test can be used) for  the duration of the  Covid-19 declaration under Section 564(b)(1) of the Act, 21  U.S.C. section 360bbb-3(b)(1), unless the authorization is  terminated or revoked. Performed at [facility], [place] [address] [place], [place] [address]     Procedures/Studies: [place] CHEST PORT 1 VIEW  Result Date: 06/08/2019 CLINICAL DATA:  Hypoxia. EXAM: PORTABLE CHEST 1 VIEW COMPARISON:  06/05/2019 FINDINGS: Left chest wall pacer device noted with leads in the right atrial appendage and right ventricle. Mild cardiac enlargement. Aortic atherosclerosis. Decreased lung volumes. No interstitial edema, pleural effusion or airspace opacity. IMPRESSION: Low lung volumes.  No acute abnormality. Electronically Signed   By: Kerby Moors M.D.   On: 06/08/2019 12:12   DG Chest Portable 1 View  Result Date: 06/05/2019 CLINICAL DATA:  Shortness of breath. EXAM: PORTABLE CHEST 1 VIEW COMPARISON:  February 06, 2016. FINDINGS: Stable cardiomediastinal silhouette. Atherosclerosis of thoracic aorta is noted. Left-sided pacemaker is unchanged in position. No pneumothorax or pleural effusion is noted. Minimal bibasilar subsegmental atelectasis is noted. Bony thorax is unremarkable. IMPRESSION: Minimal bibasilar subsegmental atelectasis. Aortic Atherosclerosis (ICD10-I70.0). Electronically Signed   By: Marijo Conception M.D.   On: 06/05/2019 10:20   DG Swallowing Func-Speech Pathology  Result Date: 06/08/2019 Objective Swallowing Evaluation: Type of Study: MBS-Modified Barium Swallow Study  Patient Details Name: [place] GROSSER MRN: [id number] Date of Birth: October 22, 1927 Today's Date: 06/08/2019 Time: SLP Start Time (ACUTE ONLY): 0935 -SLP Stop Time (ACUTE ONLY): 1000 SLP Time Calculation (min) (ACUTE ONLY): 25 min Past Medical History: Past Medical History: Diagnosis Date . CAD (coronary artery disease)   s/p stent to OM2 in past;  Last LHC 3/05: EF 60%, left RA occluded, right RA ok, prox to mid LAD 60%, oD1 75-80%, oD2 small 75%,  pOM1 50%, OM2 stent ok with 50% before stent, mRCA 30%, dRCA 30%, PDA 70%.  Last dobutamine myoview 5/06: no scar or ischemia,  EF 60%. . Chronic lower back pain  . COPD (chronic obstructive pulmonary disease) ([place])  . Dementia ([place])  . DJD (degenerative joint disease)  . Enlarged prostate  . Ethanolism ([place])  . GERD (gastroesophageal reflux disease)  . Hemorrhoids  . Hernia, abdominal   "he has ~ 4; wears binder" (11/20/2015) . HLD (hyperlipidemia)  . HTN (hypertension)   Last echo 1/07:  Normal LVF, LV thickness upper limits of normal, mild aortic root dilatation, mild to mod MAC, mild LAE, borderline RVH, mild RAE. . Macular degeneration  . Pacemaker   DR Radford Pax; a. s/p MDT dual chamber PPM with His Bundle pacing . PAD (peripheral artery disease) ([place])  . Paroxysmal atrial fibrillation ([facility])  . Psoriasis  . S/P AAA repair   Dr. Roxan Hockey . Scoliosis  . Tobacco abuse  . Unilateral congenital absence of kidney  Past Surgical History: Past Surgical History: Procedure Laterality Date . APPENDECTOMY   . CARPAL TUNNEL RELEASE Right 12/26/2014  Procedure: RIGHT ENDOSCOPIC CARPAL TUNNEL SYNDROME RELEASE;  Surgeon: Milly Jakob, MD;  Location: [place];  Service: Orthopedics;  Laterality: Right; . CATARACT EXTRACTION, BILATERAL Bilateral  . CORONARY ANGIOPLASTY WITH STENT PLACEMENT    s/p stent to OM2 in past;  . EP IMPLANTABLE DEVICE N/A 11/20/2015  Procedure: Pacemaker Implant;  Surgeon: Evans Lance, MD;  Location: [facility];  Service: Cardiovascular;  Laterality: N/A; . HERNIA REPAIR   . INSERT / REPLACE / REMOVE PACEMAKER  11/20/2015 . RESECTION AND GRAFTING OF INFARENAL  ABDOMINAL  AORTIC ANEURYSM  05/03/2002 HPI: DUWANE GEWIRTZ is a 86 y.o. male with medical history significant of pacemaker placement; s/p AAA repair; PAF on Eliquis; macular degeneration; HTN; HLD; CAD s/p stent; and COPD presenting with SOB and hypoxia into the 80s.  He had a televisit with his PCP on 1/13 and was sent for  COVID testing ([facility]).  He has been having recent difficulty breathing.  He complained of pain with breathing - has been using inhaler.  Pt reported that he occasionally gets choked up during breakfast and dinner.  CXR on 06/05/19 reported: "Minimal bibasilar subsegmental atelectasis."   Subjective: Pt seen in radiology for MBS Assessment / Plan / Recommendation CHL IP CLINICAL IMPRESSIONS 06/08/2019 Clinical Impression Pt was seen for MBS to objectively assess swallow safety and function, and to identify least restrictive diet. Pt was alert, pleasant and cooperative. He did require cues to limit bolus size, however. Orally, pt exhibited decreased bolus formation of puree and solid textures, with extended prep of solids. Premature spillage over the tongue base was seen across consistencies. Pharyngeal swallow is characterized by swallow reflex trigger at the level of the vallecular sinus on cup sips of nectar thick, puree, and solid textures, and at the pyriform sinus on thin liquids and straw drinking with nectar thick liquids. This delay of swallow reflex trigger resulted in deep flash penetration to the level of the vocal folds before the swallow on thin liquids, and subepiglottic flash penetration of nectar thick lqiuids. Penetrate cleared spontaneously, and no aspiration was seen. Barium tablet was given with puree, and cleared the oropharynx without difficulty or delay. Esophageal sweep revealed the pill to pause in the mid-esophagus, but this cleared quickly with subsequent swallows. Recommend Dys 2 diet and nectar thick liquids via cup sip, meds one at a time with puree. Pt is able to self feed, however, 1:1 supervision/assist would be beneficial to maximize adherence to safe swallow precautions, given reduced short term  memory. SLP will follow up at bedside to assess diet tolerance and continue education. Safe swallow precautions were sent back to pt room with transport.  SLP Visit Diagnosis Dysphagia,  oropharyngeal phase (R13.12)     Impact on safety and function Mild aspiration risk   CHL IP TREATMENT RECOMMENDATION 06/08/2019 Treatment Recommendations Therapy as outlined in treatment plan below   Prognosis 06/08/2019 Prognosis for Safe Diet Advancement Fair Barriers to Reach Goals Cognitive deficits, advanced age   CHL IP DIET RECOMMENDATION 06/08/2019 SLP Diet Recommendations Dysphagia 2 (Fine chop) solids;Nectar thick liquid Liquid Administration via Cup;No straw Medication Administration Whole meds with puree Compensations Minimize environmental distractions;Slow rate;Small sips/bites Postural Changes Remain semi-upright after after feeds/meals (Comment);Seated upright at 90 degrees   CHL IP OTHER RECOMMENDATIONS 06/08/2019   Oral Care Recommendations Oral care BID Other Recommendations Order thickener from pharmacy   CHL IP FOLLOW UP RECOMMENDATIONS 06/08/2019 Follow up Recommendations 24 hour supervision/assistance   CHL IP FREQUENCY AND DURATION 06/08/2019 Speech Therapy Frequency (ACUTE ONLY) min 2x/week Treatment Duration 1 week;2 weeks      CHL IP ORAL PHASE 06/08/2019 Oral Phase Impaired   Oral - Nectar Cup, Straw Premature spillage Oral - Thin Cup, Straw Premature spillage Oral - Puree Delayed oral transit;Decreased bolus cohesion;Premature spillage Oral - Mech Soft Delayed oral transit;Decreased bolus cohesion;Premature spillage    CHL IP PHARYNGEAL PHASE 06/08/2019 Pharyngeal Phase Impaired   Pharyngeal- Nectar Cup, Straw Delayed swallow initiation-vallecula;Delayed swallow initiation-pyriform sinuses;Reduced airway/laryngeal closure;Reduced tongue base retraction;Penetration/Aspiration before swallow;Penetration/Apiration after swallow;Pharyngeal residue - valleculae  Pharyngeal Material does not enter airway; Material enters airway, remains ABOVE vocal cords then ejected out   Pharyngeal- Thin Cup Delayed swallow initiation-pyriform sinuses;Reduced airway/laryngeal closure;Reduced tongue base  retraction;Penetration/Aspiration before swallow;Pharyngeal residue - valleculae Pharyngeal Material does not enter airway;Material enters airway, remains ABOVE vocal cords then ejected out   Pharyngeal- Puree Delayed swallow initiation-vallecula   Pharyngeal- Mechanical Soft Delayed swallow initiation-vallecula   Pharyngeal- Pill WFL      CHL IP CERVICAL ESOPHAGEAL PHASE 06/08/2019 Cervical Esophageal Phase Impaired Pill Barium tablet was noted to pause at the mid-esophagus. Cleared with dry swallows.   Celia B. Quentin Ore, [facility], [place] Speech Language Pathologist Office: [phone] Pager: [phone] Shonna Chock 06/08/2019, 10:55 AM              ECHOCARDIOGRAM COMPLETE  Result Date: 06/06/2019   ECHOCARDIOGRAM REPORT   Patient Name:   NAKHI CHOI [facility] Date of Exam: 06/06/2019 Medical Rec #:  [id number]       Height:       66.0 in Accession #:    [id number]      Weight:       174.4 lb Date of Birth:  Nov 19, 1927        BSA:          1.89 m Patient Age:    86 years        BP:           153/88 mmHg Patient Gender: M               HR:           87 bpm. Exam Location:  Inpatient Procedure: 2D Echo, Cardiac Doppler and Color Doppler Indications:    I50.33 Acute on chronic diastolic (congestive) heart failure  History:        Patient has prior history of Echocardiogram examinations, most                 recent  06/10/2005. Abnormal ECG and Pacemaker, Arrythmias:Atrial                 Fibrillation; Signs/Symptoms:Altered Mental Status. AAA.  Sonographer:    Roseanna Rainbow RDCS Referring Phys: [id number] JENNIFER YATES  Sonographer Comments: Patient moved througout test. IMPRESSIONS  1. Left ventricular ejection fraction, by visual estimation, is 55 to 60%. The left ventricle has normal function. There is mildly increased left ventricular hypertrophy.  2. Moderate hypokinesis of the left ventricular, basal inferolateral wall.  3. Left ventricular diastolic function could not be evaluated.  4. Mildly dilated left ventricular  internal cavity size.  5. The left ventricle demonstrates regional wall motion abnormalities.  6. Global right ventricle has mildly reduced systolic function.The right ventricular size is normal. No increase in right ventricular wall thickness.  7. Left atrial size was moderately dilated.  8. Right atrial size was moderately dilated.  9. Mild to moderate mitral annular calcification. 10. The mitral valve is normal in structure. Mild mitral valve regurgitation. No evidence of mitral stenosis. 11. The tricuspid valve is normal in structure. 12. The aortic valve is tricuspid. Aortic valve regurgitation is mild. Mild aortic valve sclerosis without stenosis. 13. The pulmonic valve was grossly normal. Pulmonic valve regurgitation is not visualized. 14. Aortic dilatation noted. 15. There is mild to moderate dilatation of the ascending aorta measuring 47 mm. 16. Mildly elevated pulmonary artery systolic pressure. 17. A pacer wire is visualized. FINDINGS  Left Ventricle: Left ventricular ejection fraction, by visual estimation, is 55 to 60%. The left ventricle has normal function. Moderate hypokinesis of the left ventricular, basal inferolateral wall. The left ventricle demonstrates regional wall motion abnormalities. The left ventricular internal cavity size was mildly dilated left ventricle. There is mildly increased left ventricular hypertrophy. Concentric left ventricular hypertrophy. The left ventricular diastology could not be evaluated due to atrial fibrillation. Left ventricular diastolic function could not be evaluated. Right Ventricle: The right ventricular size is normal. No increase in right ventricular wall thickness. Global RV systolic function is has mildly reduced systolic function. The tricuspid regurgitant velocity is 2.64 m/s, and with an assumed right atrial pressure of 8 mmHg, the estimated right ventricular systolic pressure is mildly elevated at 35.9 mmHg. Left Atrium: Left atrial size was moderately  dilated. Right Atrium: Right atrial size was moderately dilated Pericardium: There is no evidence of pericardial effusion. Mitral Valve: The mitral valve is normal in structure. Mild to moderate mitral annular calcification. Mild mitral valve regurgitation, with centrally-directed jet. No evidence of mitral valve stenosis by observation. MV peak gradient, 7.7 mmHg. Tricuspid Valve: The tricuspid valve is normal in structure. Tricuspid valve regurgitation mild-moderate. Aortic Valve: The aortic valve is tricuspid. Aortic valve regurgitation is mild. Mild aortic valve sclerosis is present, with no evidence of aortic valve stenosis. Aortic valve mean gradient measures 8.0 mmHg. Aortic valve peak gradient measures 15.5 mmHg. Aortic valve area, by VTI measures 2.26 cm. Pulmonic Valve: The pulmonic valve was grossly normal. Pulmonic valve regurgitation is not visualized. Pulmonic regurgitation is not visualized. Aorta: Aortic dilatation noted. There is mild to moderate dilatation of the ascending aorta measuring 47 mm. IAS/Shunts: No atrial level shunt detected by color flow Doppler. Additional Comments: A pacer wire is visualized.  LEFT VENTRICLE PLAX 2D LVIDd:         5.80 cm LVIDs:         3.88 cm LV PW:         1.20 cm LV IVS:  1.20 cm LVOT diam:     2.30 cm LV SV:         101 ml LV SV Index:   52.33 LVOT Area:     4.15 cm  LV Volumes (MOD) LV area d, A2C:    23.40 cm LV area d, A4C:    26.40 cm LV area s, A2C:    14.80 cm LV area s, A4C:    14.40 cm LV major d, A2C:   6.38 cm LV major d, A4C:   6.81 cm LV major s, A2C:   5.70 cm LV major s, A4C:   5.91 cm LV vol d, MOD A2C: 73.2 ml LV vol d, MOD A4C: 84.6 ml LV vol s, MOD A2C: 33.5 ml LV vol s, MOD A4C: 28.6 ml LV SV MOD A2C:     39.7 ml LV SV MOD A4C:     84.6 ml LV SV MOD BP:      49.4 ml RIGHT VENTRICLE         IVC TAPSE (M-mode): 1.1 cm  IVC diam: 3.30 cm LEFT ATRIUM              Index       RIGHT ATRIUM           Index LA diam:        4.50 cm  2.39  cm/m  RA Area:     18.90 cm LA Vol (A2C):   106.0 ml 56.18 ml/m RA Volume:   50.00 ml  26.50 ml/m LA Vol (A4C):   60.6 ml  32.12 ml/m LA Biplane Vol: 82.1 ml  43.52 ml/m  AORTIC VALVE AV Area (Vmax):    2.95 cm AV Area (Vmean):   2.96 cm AV Area (VTI):     2.26 cm AV Vmax:           197.00 cm/s AV Vmean:          130.000 cm/s AV VTI:            0.388 m AV Peak Grad:      15.5 mmHg AV Mean Grad:      8.0 mmHg LVOT Vmax:         140.00 cm/s LVOT Vmean:        92.500 cm/s LVOT VTI:          0.211 m LVOT/AV VTI ratio: 0.54  AORTA Ao Root diam: 4.10 cm MITRAL VALVE                        TRICUSPID VALVE MV Area (PHT): 3.37 cm             TR Peak grad:   27.9 mmHg MV Peak grad:  7.7 mmHg             TR Vmax:        264.00 cm/s MV Mean grad:  3.0 mmHg MV Vmax:       1.39 m/s             SHUNTS MV Vmean:      76.9 cm/s            Systemic VTI:  0.21 m MV VTI:        0.33 m               Systemic Diam: 2.30 cm MV PHT:        65.35 msec MV Decel Time: 225 msec MV E velocity:  118.33 cm/s 103 cm/s  Sanda Klein MD Electronically signed by Sanda Klein MD Signature Date/Time: 06/06/2019/11:13:49 AM    Final    CUP PACEART REMOTE DEVICE CHECK  Result Date: 05/10/2019 Scheduled remote reviewed.  Normal device function.  AF burden 6.5%, 1,511 AT/AF episodes, longest 43 mins. On Eliquis. 38 NSVT episodes, EGM's appear AF w/ RVR 150-170's at times. Known AF undersensing at times. Next remote 91 days.  DG ESOPHAGUS W SINGLE CM (SOL OR THIN BA)  Result Date: 06/07/2019 CLINICAL DATA:  Inpatient. Dementia. Dysphagia. Patient admitted with dyspnea and COPD exacerbation. EXAM: ESOPHOGRAM/BARIUM SWALLOW TECHNIQUE: Single contrast examination was performed using  thin barium. FLUOROSCOPY TIME:  Fluoroscopy Time:  2 minutes 12 seconds Radiation Exposure Index (if provided by the fluoroscopic device): 20.6 mGy Number of Acquired Spot Images: 1 COMPARISON:  10/07/2018 chest CT angiogram. FINDINGS: Examination is  significantly limited by patient related factors including mobility limitations. Study performed with the patient in the slightly elevated supine position. No frank tracheobronchial aspiration observed. No hiatal hernia. Unable to assess for gastroesophageal reflux. No gross evidence of esophageal mass or ulcer. There is mild esophageal dysmotility, characterized predominantly by proximal escape of the barium bolus in the upper thoracic esophagus. There is focal mild puckering of the lower esophageal wall near the esophagogastric junction, at which location the 13 mm barium tablet became lodged despite multiple barium and water swallows. IMPRESSION: 1. No frank tracheobronchial aspiration observed. 2. Mild esophageal dysmotility, predominantly with a presbyesophagus pattern. 3. Focal mild puckering of the lower esophageal wall near the esophagogastric junction, at which location the 13 mm barium tablet became lodged, cannot exclude a mild peptic stricture in this location. No gross evidence of esophageal mass or ulcer. No hiatal hernia. Electronically Signed   By: Ilona Sorrel M.D.   On: 06/07/2019 08:46    Time coordinating discharge: Over 30 minutes  SIGNED:   [place] Shi, MD  [facility] 06/08/2019, 5:20 PM Pager : [phone]

## 2019-06-08 NOTE — Progress Notes (Signed)
Modified Barium Swallow Progress Note  Patient Details  Name: Brad [name] MRN: [id number] Date of Birth: 07-Jul-1927  Today's Date: 06/08/2019  Modified Barium Swallow completed.  Full report located under Chart Review in the Imaging Section.  Brief recommendations include the following:  Clinical Impression Pt was seen for MBS to objectively assess swallow safety and function, and to identify least restrictive diet. Pt was alert, pleasant and cooperative. He did require cues to limit bolus size, however.   Orally, pt exhibited decreased bolus formation of puree and solid textures, with extended prep of solids. Premature spillage over the tongue base was seen across consistencies.   Pharyngeal swallow is characterized by swallow reflex trigger at the level of the vallecular sinus on cup sips of nectar thick, puree, and solid textures, and at the pyriform sinus on thin liquids and straw drinking with nectar thick liquids. This delay of swallow reflex trigger resulted in deep flash penetration to the level of the vocal folds before the swallow on thin liquids, and subepiglottic flash penetration of nectar thick lqiuids. Penetrate cleared spontaneously, and no aspiration was seen.   Barium tablet was given with puree, and cleared the oropharynx without difficulty or delay. Esophageal sweep revealed the pill to pause in the mid-esophagus, but this cleared quickly with subsequent swallows.   Recommend Dys 2 diet and nectar thick liquids via cup sip, meds one at a time with puree. Pt is able to self feed, however, 1:1 supervision/assist would be beneficial to maximize adherence to safe swallow precautions, given reduced short term memory. SLP will follow up at bedside to assess diet tolerance and continue education. Safe swallow precautions were sent back to pt room with transport.    Swallow Evaluation Recommendations SLP Diet Recommendations: Dysphagia 2 (Fine chop) solids;Nectar thick liquid   Liquid Administration via: Cup;No straw   Medication Administration: Whole meds with puree   Supervision: Patient able to self feed;Staff to assist with self feeding;Full supervision/cueing for compensatory strategies   Compensations: Minimize environmental distractions;Slow rate;Small sips/bites   Postural Changes: Remain semi-upright after after feeds/meals (Comment);Seated upright at 90 degrees   Oral Care Recommendations: Oral care BID   Other Recommendations: Order thickener from [place]. Quentin Ore, [facility], [place] Speech Language Pathologist Office: [phone] Pager: [phone]   Shonna Chock 06/08/2019,11:06 AM

## 2019-06-08 NOTE — Progress Notes (Signed)
Nurse and nurse tech ambulated in hall with patient. At rest on room air, patient's oxygen saturation was 98%; ambulating on room air, oxygen saturation was 93%. Patient denied shortness of breath while ambulating 100 feet.

## 2019-06-08 NOTE — Progress Notes (Signed)
SATURATION QUALIFICATIONS: (This note is used to comply with regulatory documentation for home oxygen)  Patient Saturations on Room Air at Rest = 98%  Patient Saturations on Room Air while Ambulating = 93%  Patient Saturations on 2 Liters of oxygen while Ambulating = 99% 

## 2019-06-08 NOTE — Progress Notes (Signed)
Patient's IV oozing blood, no blood return, difficult to flush. IV was removed. Per Earnest Conroy, MD verbal order, okay to not reinsert IV at this time.

## 2019-06-08 NOTE — Progress Notes (Signed)
Physical Therapy Treatment Patient Details Name: Brad [name] MRN: [id number] DOB: 1927/12/27 Today's Date: 06/08/2019    History of Present Illness Pt is an 86 yo male who presents with SOB and hypoxia. Found to have COPD exacerbation with a CHF component. CXR- bibasilar segmental atelectasis. PMHx: dementia, CAD s/p stent, pacemaker placement, s/p AAA repair, paroxysmal A-fib on Eliquis, macular degeneration, HTN, HLD, COPD    PT Comments    Patient progressing well towards PT goals. Unable to find pt's personal cane in his room today (was present last session). Tolerated gait training with HHA and rail/furniture in hallway. Pt slightly more unsteady without support from cane. Sp02 dropped to 84% on RA with 2/4 DOE. Donned 2L/min 02 [place] once back in room. Pt trying to find cane in room requiring Min A-Min guard for dynamic balance (bending over, looking behind bed etc). Did not know he was in the hospital. Will continue to follow.    Follow Up Recommendations  Home health PT;Supervision for mobility/OOB     Equipment Recommendations  None recommended by PT    Recommendations for Other Services       Precautions / Restrictions Precautions Precautions: Fall Precaution Comments: watch 02 Restrictions Weight Bearing Restrictions: No    Mobility  Bed Mobility               General bed mobility comments: up in chair upon PT arrival.  Transfers Overall transfer level: Needs assistance Equipment used: None Transfers: Sit to/from Stand Sit to Stand: Min guard         General transfer comment: Min guard for safety. Stood from chair x2,r eaching for counter for support.  Ambulation/Gait Ambulation/Gait assistance: Min assist Gait Distance (Feet): 100 Feet Assistive device: 1 person hand held assist(and rail) Gait Pattern/deviations: Step-through pattern;Wide base of support;Trunk flexed;Decreased stride length Gait velocity: decreased   General Gait Details: Slow,  unsteady gait with HHA on 1 side and reaching for furniture/rail on other side. (cannot find pt's personal cane anywhere in room). Sp02 dropped to 84% on RA, donned 2L/min 02 [place]. 2/4 DOE.   Stairs             Wheelchair Mobility    Modified Rankin (Stroke Patients Only)       Balance Overall balance assessment: Needs assistance Sitting-balance support: Feet supported;No upper extremity supported Sitting balance-[name] Scale: Fair     Standing balance support: During functional activity Standing balance-[name] Scale: Poor Standing balance comment: Requires UE support for static standing and external support for dynamic tasks, bending down to try to find cane behind chair and behind bed with MinA.                            Cognition Arousal/Alertness: Awake/alert Behavior During Therapy: WFL for tasks assessed/performed Overall Cognitive Status: Impaired/Different from baseline Area of Impairment: Memory;Following commands;Safety/judgement                 Orientation Level: Disoriented to;Place;Situation;Time   Memory: Decreased short-term memory Following Commands: Follows one step commands with increased time Safety/Judgement: Decreased awareness of deficits;Decreased awareness of safety   Problem Solving: Slow processing;Requires verbal cues General Comments: "my memory is gone." asking if he can go home first and shower and get cleaned up. not aware he is in the hospital.      Exercises      General Comments General comments (skin integrity, edema, etc.): Sp02 84% on RA, donned 2L.min  02 [name] and recovered quickly. Unable to find pt's personal cane in room despite it being there last session? Rn made aware.      Pertinent Vitals/Pain Pain Assessment: No/denies pain    Home Living                      Prior Function            PT Goals (current goals can now be found in the care plan section) Progress towards PT goals: Progressing  toward goals    Frequency    Min 3X/week      PT Plan Current plan remains appropriate    Co-evaluation              AM-PAC PT "6 Clicks" Mobility   Outcome Measure  Help needed turning from your back to your side while in a flat bed without using bedrails?: A Little Help needed moving from lying on your back to sitting on the side of a flat bed without using bedrails?: A Little Help needed moving to and from a bed to a chair (including a wheelchair)?: A Little Help needed standing up from a chair using your arms (e.g., wheelchair or bedside chair)?: A Little Help needed to walk in hospital room?: A Little Help needed climbing 3-5 steps with a railing? : A Lot 6 Click Score: 17    End of Session Equipment Utilized During Treatment: Gait belt Activity Tolerance: Patient tolerated treatment well Patient left: in chair;with call bell/phone within reach;with chair alarm set Nurse Communication: Mobility status;Other (comment)(cane) PT Visit Diagnosis: Unsteadiness on feet (R26.81);Muscle weakness (generalized) (M62.81);Difficulty in walking, not elsewhere classified (R26.2)     Time: 1202-1216 PT Time Calculation (min) (ACUTE ONLY): 14 min  Charges:  $Gait Training: 8-22 mins                     Marisa Severin, PT, DPT Acute Rehabilitation Services Pager [phone] Office [phone]       [place] 06/08/2019, 1:43 PM

## 2019-06-08 NOTE — Progress Notes (Signed)
Nurse spoke with Jori Moll (patient's son) on the phone. Jori Moll was updated on plan of care for patient. All questions and concerns answered.

## 2019-06-08 NOTE — Progress Notes (Signed)
Discharge education and medication education given to patient and son Jori Moll)  with teach back. Education on dysphagia diet given.  All questions and concerns answered. Peripheral IV previously removed and telemetry leads removed. All patient belongings given to patient. Patient transported to main entrance by nurse tech via wheelchair.

## 2019-06-12 ENCOUNTER — Other Ambulatory Visit: Payer: Self-pay | Admitting: Family Medicine

## 2019-06-14 ENCOUNTER — Telehealth: Payer: Self-pay | Admitting: Family Medicine

## 2019-06-14 ENCOUNTER — Other Ambulatory Visit: Payer: Self-pay | Admitting: *Deleted

## 2019-06-14 ENCOUNTER — Other Ambulatory Visit: Payer: Self-pay | Admitting: Family Medicine

## 2019-06-14 DIAGNOSIS — R3989 Other symptoms and signs involving the genitourinary system: Secondary | ICD-10-CM

## 2019-06-14 NOTE — Patient Outreach (Signed)
[name]) Care Management  06/14/2019  Brad [name] 1928-02-06 [id number]   RED ON EMMI ALERT - General Discharge Day # 1 Date: 1/22 Red Alert Reason: Questions about discharge papers and no follow up scheduled   Outreach attempt #1, successful.  Referral received from hospital liaison as member was recently admitted to hospital for COPD exacerbation.  PCP office will complete transition of care assessment.  Per chart, he also has history of HTN, PVD, A-fib, CHF, COPD, GERD, Dementia, BPH, and HLD.  Noted that member also triggered red on EMMI dashboard for above noted reason.    Per chart, son Brad [name] is contact person.  Call placed to follow up on discharge, member's identity verified.  This care manager introduced self and stated purpose of call.  [facility] care management services explained.    Member's mailing address is his home, was living alone until his hospitalization, now living with son.  He report member is with either he or his wife at all times, also has support of his brother but is interested in resources for respite care.  He is also interested resources to keep the member interactive with others and the community.  Discussed [facility], open to referral and more information.  State member was slightly confused upon discharge but is now back at his baseline.  Noted that PT inpatient recommended home health PT but no order found.  Member has history of falls, uses walker for ambulation.    Son report they monitor member's weights and blood pressure daily as well as manage his medications.  State they were having a hard time teaching the member how to use his inhaler but he is now able to use correctly.  He crushes his other meds and administer in applesauce.  Report follow up appointment is scheduled with PCP for Wednesday, son will discuss recommendation for PT during visit.  Son verbalizes understanding of discharge instructions, denies questions.  Denies any  urgent concerns, will follow up within the next 2 weeks.  Fall Risk  06/14/2019 04/29/2019 11/26/2018 08/19/2018 07/20/2018  Falls in the past year? 1 1 1 1 1   Number falls in past yr: 1 1 1  0 0  Injury with Fall? 0 0 0 0 0  Risk Factor Category  - - - - -  Risk for fall due to : History of fall(s) History of fall(s);Impaired mobility Impaired balance/gait;History of fall(s) - -  Follow up Falls prevention discussed - - - -   Depression screen [facility] 2/9 11/26/2018 08/19/2018 07/20/2018 06/08/2018 05/23/2018  Decreased Interest 0 0 0 0 0  Down, Depressed, Hopeless 0 0 3 2 0  PHQ - 2 Score 0 0 3 2 0  Altered sleeping - - 0 2 -  Tired, decreased energy - - 3 3 -  Change in appetite - - 0 0 -  Feeling bad or failure about yourself  - - 3 1 -  Trouble concentrating - - 3 3 -  Moving slowly or fidgety/restless - - 0 0 -  Suicidal thoughts - - 0 0 -  PHQ-9 Score - - 12 11 -  Some recent data might be hidden   [facility] CM Care Plan Problem One     Most Recent Value  Care Plan Problem One  Risk for readmission to hospital related to COPD management as evidenced by recent admission  Role Documenting the Problem One  Care Management [place] for Problem One  Active  [place] Term Goal   Member will not be readmitted to hospital within the next 31 days  [facility] Long Term Goal Start Date  06/14/19  Interventions for Problem One Long Term Goal  Discharge instructions reviewed with son, discussed plan of care and importance of following in effort to decrease risk of readmission.   [facility] CM Short Term Goal #1   Family will report completion of follow up appointment with PCP within the next 2 weeks  [facility] CM Short Term Goal #1 Start Date  06/14/19  Interventions for Short Term Goal #1  Reviewed follow up appontment specifics with son, confirmed no transportation needs   [facility] CM Short Term Goal #2   Son will report plan for increased support within the next 3 weeks  [facility] CM Short Term Goal #2 Start Date  06/14/19   Interventions for Short Term Goal #2  Referral placed to social worker for resources for support, referral placed to [place], Therapist, sports, MSN [place] Manager [phone]

## 2019-06-14 NOTE — Telephone Encounter (Signed)
rc for nurse 

## 2019-06-14 NOTE — Telephone Encounter (Signed)
Appt made for this Wednesday per patients sons request

## 2019-06-15 ENCOUNTER — Other Ambulatory Visit: Payer: Self-pay

## 2019-06-16 ENCOUNTER — Ambulatory Visit (INDEPENDENT_AMBULATORY_CARE_PROVIDER_SITE_OTHER): Payer: Medicare Other | Admitting: Family Medicine

## 2019-06-16 ENCOUNTER — Other Ambulatory Visit: Payer: Self-pay | Admitting: *Deleted

## 2019-06-16 ENCOUNTER — Encounter: Payer: Self-pay | Admitting: Family Medicine

## 2019-06-16 VITALS — BP 110/66 | HR 73 | Temp 97.4°F | Ht 66.0 in | Wt 171.0 lb

## 2019-06-16 DIAGNOSIS — I10 Essential (primary) hypertension: Secondary | ICD-10-CM | POA: Diagnosis not present

## 2019-06-16 DIAGNOSIS — J410 Simple chronic bronchitis: Secondary | ICD-10-CM | POA: Diagnosis not present

## 2019-06-16 DIAGNOSIS — T17908A Unspecified foreign body in respiratory tract, part unspecified causing other injury, initial encounter: Secondary | ICD-10-CM | POA: Diagnosis not present

## 2019-06-16 DIAGNOSIS — I480 Paroxysmal atrial fibrillation: Secondary | ICD-10-CM | POA: Diagnosis not present

## 2019-06-16 DIAGNOSIS — F0390 Unspecified dementia without behavioral disturbance: Secondary | ICD-10-CM

## 2019-06-16 DIAGNOSIS — F03A0 Unspecified dementia, mild, without behavioral disturbance, psychotic disturbance, mood disturbance, and anxiety: Secondary | ICD-10-CM

## 2019-06-16 DIAGNOSIS — I25119 Atherosclerotic heart disease of native coronary artery with unspecified angina pectoris: Secondary | ICD-10-CM | POA: Diagnosis not present

## 2019-06-16 DIAGNOSIS — I50811 Acute right heart failure: Secondary | ICD-10-CM

## 2019-06-16 MED ORDER — BREO ELLIPTA 100-25 MCG/INH IN AEPB: 1.0000 | INHALATION_SPRAY | Freq: Every day | RESPIRATORY_TRACT | 11 refills | Status: AC

## 2019-06-16 NOTE — Patient Outreach (Signed)
[facility]) Care Management  06/16/2019  Brad [name] [birth year] [id number]   RED ON EMMI ALERT - General Discharge Day # 4 Date: 1/25 Red Alert Reason: No follow up scheduled    Notified that member triggered red on EMMI dashboard for above noted reason.  No call placed to member/son as call was placed on the day of alert and appointment has already been scheduled.  No needs at this time, will plan to follow up in the next 2 weeks as planned.  [name], [place], MSN [place] [phone]

## 2019-06-16 NOTE — Progress Notes (Addendum)
Subjective:  Patient ID: Brad [name], male    DOB: 08-Jan-1928  Age: 86 y.o. MRN: [id number]  CC: Hospitalization Follow-up [facility] )   HPI [name] presents for patient was recently hospitalized for COPD and CHF.  He was noted during the stay also to have aspiration.  After study the discharge summary there is no sign that he had pneumonia from the aspiration tendency.  He was prescribed thickeners and told he should not eat anything that he has to chew.  The patient is uncomfortable with this.  The thickener is not appetizing.  He does not want to use it.  He and his son question the severity of the need for pured diet along with thickeners  Patient has not been using a maintenance inhaler.  He is using the Combivent a couple times a day.  He still has mild shortness of breath but is comfortable.  He is sleeping well.  His son tells me that he has some progression of confusion but he is generally cognizant.  The patient himself relates that he feels a bit more confused at times as well.  Unfortunately he is on the maximum medical treatment available for dementia at this time.  His echocardiogram was reviewed showing that he has some right heart failure.  The right ventricle was dilated.  His left heart appeared normal with an ejection fraction of 55 to 60% on that test.  Patient is having mild edema of the legs.  Again only mild shortness of breath.  This seems to be more related to his COPD.  Patient is a former smoker.  During his hospital stay he was checked for heart attack and that came out negative.  Troponins were normal.  He is not suffering from any chest pain currently.   Patient in for follow-up of atrial fibrillation. Patient denies any recent bouts of chest pain or palpitations. Additionally, patient is taking anticoagulants. Patient denies any recent excessive bleeding episodes including epistaxis, bleeding from the gums, genitalia, rectal bleeding or hematuria.  Additionally there has been no excessive bruising.   Depression screen [facility] 2/9 06/16/2019 11/26/2018 08/19/2018  Decreased Interest 0 0 0  Down, Depressed, Hopeless 0 0 0  PHQ - 2 Score 0 0 0  Altered sleeping - - -  Tired, decreased energy - - -  Change in appetite - - -  Feeling bad or failure about yourself  - - -  Trouble concentrating - - -  Moving slowly or fidgety/restless - - -  Suicidal thoughts - - -  PHQ-9 Score - - -  Some recent data might be hidden    History [name] has a past medical history of Acute on chronic respiratory failure ([name]) (06/06/2019), CAD (coronary artery disease), Chronic lower back pain, COPD (chronic obstructive pulmonary disease) ([name]), Dementia ([name]), DJD (degenerative joint disease), Enlarged prostate, Ethanolism ([name]), GERD (gastroesophageal reflux disease), Hemorrhoids, Hernia, abdominal, HLD (hyperlipidemia), HTN (hypertension), Macular degeneration, Pacemaker, PAD (peripheral artery disease) ([name]), Paroxysmal atrial fibrillation ([name]), Psoriasis, S/P AAA repair, Scoliosis, Tobacco abuse, and Unilateral congenital absence of kidney.   He has a past surgical history that includes Appendectomy; RESECTION AND GRAFTING OF INFARENAL  ABDOMINAL  AORTIC ANEURYSM (05/03/2002); Carpal tunnel release (Right, 12/26/2014); Insert / replace / remove pacemaker (11/20/2015); Cataract extraction, bilateral (Bilateral); Coronary angioplasty with stent; Cardiac catheterization (N/A, 11/20/2015); and Hernia repair.   His family history includes AAA (abdominal aortic aneurysm) in his son; Arthritis in his son; COPD in his  brother and sister; Cancer in his brother, mother, sister, and son; Early death in his brother and sister; Hypertension in his son; Stroke in his father and mother.He reports that he quit smoking about 23 years ago. His smoking use included cigarettes. He has a 130.00 pack-year smoking history. He has never used smokeless tobacco. He reports current alcohol use. He  reports that he does not use drugs.    ROS Review of Systems  Constitutional: Negative.   HENT: Negative.   Eyes: Negative for visual disturbance.  Respiratory: Positive for shortness of breath. Negative for cough.   Cardiovascular: Positive for leg swelling (Mild evidence for right-sided heart failure.). Negative for chest pain.  Gastrointestinal: Negative for abdominal pain, diarrhea, nausea and vomiting.  Genitourinary: Negative for difficulty urinating.  Musculoskeletal: Negative for arthralgias and myalgias.  Skin: Negative for rash.  Neurological: Negative for headaches.  Psychiatric/Behavioral: Positive for confusion (This seems to be mild, see HPI). Negative for sleep disturbance.    Objective:  BP 110/66   Pulse 73   Temp (!) 97.4 F (36.3 C) (Temporal)   Ht _0  (1.676 m)   Wt 171 lb (77.6 kg)   SpO2 98%   BMI 27.60 kg/m   BP Readings from Last 3 Encounters:  06/16/19 110/66  06/08/19 (!) 147/92  04/29/19 (!) 180/81    Wt Readings from Last 3 Encounters:  06/16/19 171 lb (77.6 kg)  06/08/19 161 lb (73 kg)  04/29/19 186 lb (84.4 kg)     Physical Exam Vitals reviewed.  Constitutional:      Appearance: He is well-developed.  HENT:     Head: Normocephalic and atraumatic.     Right Ear: External ear normal.     Left Ear: External ear normal.     Mouth/Throat:     Pharynx: No oropharyngeal exudate or posterior oropharyngeal erythema.  Eyes:     Pupils: Pupils are equal, round, and reactive to light.  Cardiovascular:     Rate and Rhythm: Normal rate and regular rhythm.     Heart sounds: No murmur.  Pulmonary:     Effort: No respiratory distress.     Breath sounds: Normal breath sounds.  Musculoskeletal:     Cervical back: Normal range of motion and neck supple.  Neurological:     Mental Status: He is alert and oriented to person, place, and time.       Assessment & Plan:   Furman was seen today for hospitalization follow-up.  Diagnoses and  all orders for this visit:  Essential hypertension -     CBC with Differential/Platelet -     CMP14+EGFR  Atherosclerosis of native coronary artery of native heart with angina pectoris (HCC) -     CBC with Differential/Platelet -     CMP14+EGFR  Simple chronic bronchitis (HCC) -     CBC with Differential/Platelet -     CMP14+EGFR -     fluticasone furoate-vilanterol (BREO ELLIPTA) 100-25 MCG/INH AEPB; Inhale 1 puff into the lungs daily.  Paroxysmal atrial fibrillation (HCC) -     CBC with Differential/Platelet -     CMP14+EGFR  Mild dementia (HCC) -     CBC with Differential/Platelet -     CMP14+EGFR  Acute right-sided CHF (congestive heart failure) (HCC) -     CBC with Differential/Platelet -     CMP14+EGFR  Aspiration into airway, initial encounter -     Cancel: Ambulatory referral to Speech Therapy -     Ambulatory referral  to Speech Therapy   The patient would prefer quality to quantity of life overall.  He does have a DNR on file at the hospital apparently and that is his wish..  As result he is reluctant to change his diet so dramatically.  I suggested that speech therapy may be able to come up with a outpatient program to help him with that since it seems to be mild overall with regard to his aspiration syndrome.  The patient has multiple cardiac diagnoses from atrial  fibrillation CHF to ASCVD.  All of those actually seem to have stabilized since his hospitalization.  I will maintain him on his current regimen.  Although there was a trace edema I do think that there is a bit of the risk of dehydration if we over diurese him here.  He has been instructed to notify the office if there is a change of weight more than 3 pounds in 1 day or 5 pounds in a week.  He has moved in with his son Edd Arbour and Edd Arbour will help monitor the weight swelling and breathing.    I have discontinued Orey P. Padget's predniSONE. I am also having him start on Breo Ellipta. Additionally, I am  having him maintain his acetaminophen, fluticasone, Metamucil Smooth Texture, donepezil, fexofenadine, Eliquis, memantine, finasteride, Klor-Con M20, AeroChamber MV, ondansetron, Combivent Respimat, furosemide, food thickener, isosorbide mononitrate, rosuvastatin, metoprolol tartrate, and tamsulosin.  Allergies as of 06/16/2019      Reactions   Aspirin Shortness Of Breath, Swelling   Atorvastatin Other (See Comments)   Leg pain, feet pain   Cephalexin Other (See Comments)   Unknown reaction      Medication List       Accurate as of June 16, 2019 11:59 PM. If you have any questions, ask your nurse or doctor.        STOP taking these medications   predniSONE 20 MG tablet Commonly known as: DELTASONE Stopped by: [name], MD     TAKE these medications   AeroChamber MV inhaler Use as instructed   Breo Ellipta 100-25 MCG/INH Aepb Generic drug: fluticasone furoate-vilanterol Inhale 1 puff into the lungs daily. Started by: [name], MD   Combivent Respimat 20-100 MCG/ACT Aers respimat Generic drug: Ipratropium-Albuterol Inhale 1 puff into the lungs every 6 (six) hours as needed for wheezing or shortness of breath.   donepezil 10 MG tablet Commonly known as: ARICEPT Take 1 tablet daily What changed:   how much to take  how to take this  when to take this  additional instructions   Eliquis 5 MG Tabs tablet Generic drug: apixaban TAKE 1 TABLET BY MOUTH TWICE A DAY What changed: how much to take   fexofenadine 180 MG tablet Commonly known as: ALLEGRA TAKE 1 TABLET (180 MG TOTAL) BY MOUTH DAILY. FOR ALLERGY SYMPTOMS   finasteride 5 MG tablet Commonly known as: PROSCAR TAKE 1 TABLET BY MOUTH EVERY DAY   fluticasone 50 MCG/ACT nasal spray Commonly known as: FLONASE SPRAY 2 SPRAYS INTO EACH NOSTRIL EVERY DAY What changed: See the new instructions.   food thickener Powd Commonly known as: SIMPLYTHICK Take 1 packet by mouth as needed.   furosemide 20  MG tablet Commonly known as: Lasix Take 1 tablet (20 mg total) by mouth daily.   isosorbide mononitrate 30 MG 24 hr tablet Commonly known as: IMDUR TAKE 1 TABLET (30 MG TOTAL) BY MOUTH DAILY. (NEEDS TO BE SEEN BEFORE NEXT REFILL)   Klor-Con M20 20 MEQ tablet  Generic drug: potassium chloride SA TAKE 1 TABLET BY MOUTH EVERY DAY What changed: how much to take   memantine 28 MG Cp24 24 hr capsule Commonly known as: NAMENDA XR Take 1 capsule (28 mg total) by mouth daily. Needs to be seen for further refills. What changed: when to take this   Metamucil Smooth Texture 58.6 % powder Generic drug: psyllium Take 1 packet by mouth 3 (three) times daily. What changed:   when to take this  reasons to take this   metoprolol tartrate 25 MG tablet Commonly known as: LOPRESSOR TAKE 1 TABLET BY MOUTH TWICE A DAY   ondansetron 4 MG tablet Commonly known as: Zofran Take 1 tablet (4 mg total) by mouth every 8 (eight) hours as needed for nausea or vomiting.   rosuvastatin 5 MG tablet Commonly known as: CRESTOR Take 1 tablet (5 mg total) by mouth daily with supper.   tamsulosin 0.4 MG Caps capsule Commonly known as: FLOMAX TAKE 2 CAPSULES (0.8 MG TOTAL) BY MOUTH AT BEDTIME.   Tylenol Arthritis Pain 650 MG CR tablet Generic drug: acetaminophen Take 1,300 mg by mouth 2 (two) times a day.        Follow-up: Return in about 2 weeks (around 06/30/2019).  [name], M.D.

## 2019-06-17 LAB — CMP14+EGFR
ALT: 20 IU/L (ref 0–44)
AST: 18 IU/L (ref 0–40)
Albumin/Globulin Ratio: 1.8 (ref 1.2–2.2)
Albumin: 3.5 g/dL — ABNORMAL LOW (ref 3.6–4.6)
Alkaline Phosphatase: 51 IU/L (ref 39–117)
BUN/Creatinine Ratio: 9 — ABNORMAL LOW (ref 10–24)
BUN: 7 mg/dL — ABNORMAL LOW (ref 8–27)
Bilirubin Total: 0.4 mg/dL (ref 0.0–1.2)
CO2: 26 mmol/L (ref 20–29)
Calcium: 7 mg/dL — ABNORMAL LOW (ref 8.6–10.2)
Chloride: 104 mmol/L (ref 96–106)
Creatinine, Ser: 0.74 mg/dL — ABNORMAL LOW (ref 0.76–1.27)
GFR calc Af Amer: 95 mL/min/{1.73_m2} (ref >59)
GFR calc non Af Amer: 82 mL/min/{1.73_m2} (ref >59)
Globulin, Total: 1.9 g/dL (ref 1.5–4.5)
Glucose: 92 mg/dL (ref 65–99)
Potassium: 4.4 mmol/L (ref 3.5–5.2)
Sodium: 141 mmol/L (ref 134–144)
Total Protein: 5.4 g/dL — ABNORMAL LOW (ref 6.0–8.5)

## 2019-06-17 LAB — CBC WITH DIFFERENTIAL/PLATELET
Basophils Absolute: 0 10*3/uL (ref 0.0–0.2)
Basos: 0 %
EOS (ABSOLUTE): 0.1 10*3/uL (ref 0.0–0.4)
Eos: 3 %
Hematocrit: 35 % — ABNORMAL LOW (ref 37.5–51.0)
Hemoglobin: 11 g/dL — ABNORMAL LOW (ref 13.0–17.7)
Immature Grans (Abs): 0 10*3/uL (ref 0.0–0.1)
Immature Granulocytes: 0 %
Lymphocytes Absolute: 1 10*3/uL (ref 0.7–3.1)
Lymphs: 19 %
MCH: 30.2 pg (ref 26.6–33.0)
MCHC: 31.4 g/dL — ABNORMAL LOW (ref 31.5–35.7)
MCV: 96 fL (ref 79–97)
Monocytes Absolute: 0.7 10*3/uL (ref 0.1–0.9)
Monocytes: 13 %
Neutrophils Absolute: 3.4 10*3/uL (ref 1.4–7.0)
Neutrophils: 65 %
Platelets: 153 10*3/uL (ref 150–450)
RBC: 3.64 x10E6/uL — ABNORMAL LOW (ref 4.14–5.80)
RDW: 14.7 % (ref 11.6–15.4)
WBC: 5.2 10*3/uL (ref 3.4–10.8)

## 2019-06-18 ENCOUNTER — Other Ambulatory Visit: Payer: Self-pay

## 2019-06-18 NOTE — Patient Outreach (Signed)
[name] Care Management  06/18/2019  Brad [name] 02/11/1928 [id number]   Social Work referral received from [employer], [place], on 06/10/19.  Referral stated "Patient with dementia, now living with son, [name].  He is interested in resources for respite care when needed and also resources in the community to keep member active. " Successful outreach to son today.  Patient now living in [place].  Discussed various services/levels of care in the area.  After discussion, respite does seem to be most appropriate service to meet caregiver needs at this time. Also discussed [place] and [employer] [facility] [place] as options to keep patient active in the community.   Son requested that resources be emailed to him.   The following was sent: Software engineer with all services offered.   [employer] Caregiver Support Groups Link to [employer] [facility] [employer] [place]  Will follow up with son within the next two weeks.  Ronn Melena, BSW Social Worker [phone]

## 2019-06-23 ENCOUNTER — Other Ambulatory Visit: Payer: Self-pay

## 2019-06-23 ENCOUNTER — Telehealth: Payer: Self-pay | Admitting: Family Medicine

## 2019-06-23 ENCOUNTER — Ambulatory Visit (HOSPITAL_COMMUNITY): Payer: Medicare Other | Attending: Family Medicine | Admitting: Speech Pathology

## 2019-06-23 ENCOUNTER — Encounter (HOSPITAL_COMMUNITY): Payer: Self-pay | Admitting: Speech Pathology

## 2019-06-23 DIAGNOSIS — U071 COVID-19: Secondary | ICD-10-CM | POA: Diagnosis not present

## 2019-06-23 DIAGNOSIS — J9600 Acute respiratory failure, unspecified whether with hypoxia or hypercapnia: Secondary | ICD-10-CM | POA: Diagnosis not present

## 2019-06-23 DIAGNOSIS — J1282 Pneumonia due to coronavirus disease 2019: Secondary | ICD-10-CM | POA: Diagnosis not present

## 2019-06-23 DIAGNOSIS — J690 Pneumonitis due to inhalation of food and vomit: Secondary | ICD-10-CM | POA: Diagnosis not present

## 2019-06-23 DIAGNOSIS — H353111 Nonexudative age-related macular degeneration, right eye, early dry stage: Secondary | ICD-10-CM | POA: Diagnosis not present

## 2019-06-23 DIAGNOSIS — H353122 Nonexudative age-related macular degeneration, left eye, intermediate dry stage: Secondary | ICD-10-CM | POA: Diagnosis not present

## 2019-06-23 DIAGNOSIS — J9601 Acute respiratory failure with hypoxia: Secondary | ICD-10-CM | POA: Diagnosis not present

## 2019-06-23 DIAGNOSIS — H52223 Regular astigmatism, bilateral: Secondary | ICD-10-CM | POA: Diagnosis not present

## 2019-06-23 DIAGNOSIS — R0602 Shortness of breath: Secondary | ICD-10-CM | POA: Diagnosis not present

## 2019-06-23 DIAGNOSIS — J9621 Acute and chronic respiratory failure with hypoxia: Secondary | ICD-10-CM | POA: Diagnosis not present

## 2019-06-23 DIAGNOSIS — J440 Chronic obstructive pulmonary disease with acute lower respiratory infection: Secondary | ICD-10-CM | POA: Diagnosis not present

## 2019-06-23 DIAGNOSIS — Z961 Presence of intraocular lens: Secondary | ICD-10-CM | POA: Diagnosis not present

## 2019-06-23 DIAGNOSIS — R1312 Dysphagia, oropharyngeal phase: Secondary | ICD-10-CM

## 2019-06-23 DIAGNOSIS — H524 Presbyopia: Secondary | ICD-10-CM | POA: Diagnosis not present

## 2019-06-23 DIAGNOSIS — Z515 Encounter for palliative care: Secondary | ICD-10-CM | POA: Diagnosis not present

## 2019-06-23 DIAGNOSIS — H5203 Hypermetropia, bilateral: Secondary | ICD-10-CM | POA: Diagnosis not present

## 2019-06-23 DIAGNOSIS — A419 Sepsis, unspecified organism: Secondary | ICD-10-CM | POA: Diagnosis not present

## 2019-06-23 NOTE — Therapy (Signed)
[place], [place], [address] Phone: [phone]   Fax:  [phone]  Speech Language Pathology Evaluation  Patient Details  Name: Brad [name] MRN: [id number] Date of Birth: 05/21/1927 No data recorded  Encounter Date: 06/23/2019  End of Session - 06/23/19 1211    Visit Number  1    Number of Visits  1    Authorization Type  Medicare    SLP Start Time  0915    SLP Stop Time   1000    SLP Time Calculation (min)  45 min    Activity Tolerance  Patient tolerated treatment well       Past Medical History:  Diagnosis Date  . Acute on chronic respiratory failure ([place]) 06/06/2019  . CAD (coronary artery disease)    s/p stent to OM2 in past;  Last LHC 3/05: EF 60%, left RA occluded, right RA ok, prox to mid LAD 60%, oD1 75-80%, oD2 small 75%, pOM1 50%, OM2 stent ok with 50% before stent, mRCA 30%, dRCA 30%, PDA 70%.  Last dobutamine myoview 5/06: no scar or ischemia, EF 60%.  . Chronic lower back pain   . COPD (chronic obstructive pulmonary disease) ([place])   . Dementia ([place])   . DJD (degenerative joint disease)   . Enlarged prostate   . Ethanolism ([place])   . GERD (gastroesophageal reflux disease)   . Hemorrhoids   . Hernia, abdominal    "he has ~ 4; wears binder" (11/20/2015)  . HLD (hyperlipidemia)   . HTN (hypertension)    Last echo 1/07:  Normal LVF, LV thickness upper limits of normal, mild aortic root dilatation, mild to mod MAC, mild LAE, borderline RVH, mild RAE.  . Macular degeneration   . Pacemaker    DR Radford Pax; a. s/p MDT dual chamber PPM with His Bundle pacing  . PAD (peripheral artery disease) ([place])   . Paroxysmal atrial fibrillation (HCC)   . Psoriasis   . S/P AAA repair    Dr. Roxan Hockey  . Scoliosis   . Tobacco abuse   . Unilateral congenital absence of kidney     Past Surgical History:  Procedure Laterality Date  . APPENDECTOMY    . CARPAL TUNNEL RELEASE Right 12/26/2014   Procedure: RIGHT  ENDOSCOPIC CARPAL TUNNEL SYNDROME RELEASE;  Surgeon: Milly Jakob, MD;  Location: [facility];  Service: Orthopedics;  Laterality: Right;  . CATARACT EXTRACTION, BILATERAL Bilateral   . CORONARY ANGIOPLASTY WITH STENT PLACEMENT     s/p stent to OM2 in past;   . EP IMPLANTABLE DEVICE N/A 11/20/2015   Procedure: Pacemaker Implant;  Surgeon: Evans Lance, MD;  Location: [place] [facility] LAB;  Service: Cardiovascular;  Laterality: N/A;  . HERNIA REPAIR    . INSERT / REPLACE / REMOVE PACEMAKER  11/20/2015  . RESECTION AND GRAFTING OF INFARENAL  ABDOMINAL  AORTIC ANEURYSM  05/03/2002    There were no vitals filed for this visit.  Subjective Assessment - 06/23/19 1200    Subjective  "He would occasionally get choked when eating and drinking." -Son    Patient is accompained by:  Family member    Currently in Pain?  No/denies       Prior Functional Status - 06/23/19 1202      Prior Functional Status   Cognitive/Linguistic Baseline  Information not available    Type of Home  House     Lives With  Son    Available Help  at Discharge  Family;Available 24 hours/day      General - 06/23/19 1202      General Information   Date of Onset  06/06/19    HPI  Brad [name] is an 86 y.o. male with medical history significant of pacemaker placement; s/p AAA repair; PAF on Eliquis; macular degeneration; HTN; HLD; CAD s/p stent; and COPD presenting with SOB and hypoxia into the 80s.  He was recently admitted and discharged from [facility] in January and had MBSS completed during his stay with a recommendation for soft solids and NTL. Pt is now living with his son who accompanied him to today's evaluation and assisted in providing background information. He has had a discussion with his PCP [name]), regarding his dislike for a modified diet and thickened liquids. The patient would prefer quality to quantity of life overall.  He does have a DNR on file at the hospital apparently and that  is his wish..  As result he is reluctant to change his diet so dramatically. A clinical swallow evaluation was recommended.    Type of Study  Bedside Swallow Evaluation    Previous Swallow Assessment  MBSS at [facility] on 1/19//21: Dys 2 diet and nectar thick liquids via cup sip, meds one at a time with puree    Diet Prior to this Study  Dysphagia 3 (soft);Nectar-thick liquids    Temperature Spikes Noted  No    Respiratory Status  Room air    History of Recent Intubation  No    Behavior/Cognition  Alert;Cooperative;Pleasant mood;Requires cueing    Oral Cavity Assessment  Within Functional Limits    Oral Care Completed by SLP  No    Oral Cavity - Dentition  Dentures, top;Dentures, bottom    Vision  Functional for self-feeding    Self-Feeding Abilities  Able to feed self    Patient Positioning  Upright in chair    Baseline Vocal Quality  Normal    Volitional Cough  Strong    Volitional Swallow  Able to elicit       Oral Motor/Sensory Function - 06/23/19 1209      Oral Motor/Sensory Function   Overall Oral Motor/Sensory Function  Within functional limits      Ice Chips - 06/23/19 1209      Ice Chips   Ice chips  Not tested      Thin Liquid - 06/23/19 1209      Thin Liquid   Thin Liquid  Impaired    Presentation  Cup;Self Fed    Pharyngeal  Phase Impairments  Throat Clearing - Delayed;Cough - Delayed    Other Comments  ? esophageal component, seems to have regurgitated liquids today      Nectar thick liquid - 06/23/19 1210      Nectar Thick Liquid   Nectar Thick Liquid  Not tested       Puree - 06/23/19 1210      Puree   Puree  Within functional limits        SLP Education - 06/23/19 1200    Education Details  Education given regarding aspiration risks and precautions, soft diet, request for palliative care consult if son desires    Person(s) Educated  Patient;Child(ren)    Methods  Explanation;Handout    Comprehension  Verbalized understanding         Plan - 06/23/19 1212    Clinical Impression Statement  Clinical swallow evaluation and education completed as an outpatient this date  with Pt's son present. Pt presents with suspected pharyngeal esophageal dysphagia due to regurgitation experienced after liquid trials this date. Pt was seen for MBSS during his acute stay at [facility] in January and was discharged on a D2 diet with NTL which is reportedly unsatisfactory to the Pt understandably. Pt's son states that he would like for his father to be able to have what he asks for given that he is 86 years old. SLP reviewed the previous imaging and report of MBSS with the Pt and son, provided information regarding risks for aspiration (rehospitalization due to possible PNA/SOB), and a list of soft food recommendations for Pt. Gross aspiration of thin liquids was not observed during MBSS at [facility], however deep penetration observed a couple of times (pt also penetrated NTL). Pt would like to accept risks for aspiration and proceed with thin liquids. Recommend D3/mech soft (food ideas given) and thin liquids via small, cup sips with aspiration precautions (swallow 2x for each sip and clear throat/cough periodically). Of note, the Pt's son reports a decline in Pt's walking and balance over the past few days. During po trials, Pt appeared to regurgitate liquids after he appeared to swallow so this raises concern for esophageal component. Pt's son plans to call Pt's PCP if Pt declines further. He is also interested in help at home and/or palliative care consult to assist with establishing goals of care. Pt and son were given my contact information should they have further questions or concerns and they were encouraged to call me. Pt's son expressed appreciation for visit.    Treatment/Interventions  Patient/family education;SLP instruction and feedback;Compensatory strategies    Potential to Achieve Goals  Good    Potential Considerations  Ability to  learn/carryover information    Consulted and Agree with Plan of Care  Patient;Family member/caregiver       Patient will benefit from skilled therapeutic intervention in order to improve the following deficits and impairments:   Oropharyngeal dysphagia    Problem List Patient Active Problem List   Diagnosis Date Noted  . Dysphagia 06/08/2019  . Acute right-sided CHF (congestive heart failure) ([place]) 06/08/2019  . Balanitis 06/05/2019  . DNR (do not resuscitate) 06/05/2019  . Pacemaker 02/11/2019  . Lower GI bleeding 12/13/2018  . Dementia due to Alzheimer's disease ([place]) 10/02/2018  . Blood in urine 12/04/2016  . Mild dementia ([place]) 07/03/2016  . Atrial fibrillation ([place]) 11/21/2015  . Sinus node dysfunction ([place]) 11/20/2015  . BPH (benign prostatic hyperplasia) 07/07/2015  . Hyperlipemia 07/07/2015  . Ventral hernia 05/31/2013  . Carpal tunnel syndrome, bilateral 05/07/2011  . Ethanolism ([place])   . COPD (chronic obstructive pulmonary disease) ([place])   . ADENOMATOUS COLONIC POLYP 04/18/2008  . Essential hypertension 04/18/2008  . Coronary atherosclerosis 04/18/2008  . AAA 04/18/2008  . PVD 04/18/2008  . GERD 04/18/2008  . DIVERTICULOSIS, COLON 04/18/2008   Thank you,  Genene Churn, [place]  [facility] 06/23/2019, 12:13 PM  [place] [address] [place], [place], [address] Phone: [phone]   Fax:  [phone]  Name: Brad [name] MRN: [id number] Date of Birth: 1928/02/23

## 2019-06-23 NOTE — Telephone Encounter (Signed)
Wheelchair is great. Try to get him to use a walker as well. Would you like PT ordered for baclance and strengthening?

## 2019-06-23 NOTE — Telephone Encounter (Signed)
LMTCB

## 2019-06-23 NOTE — Telephone Encounter (Signed)
Son just wanted Dr. [name] to know about ambulation and that is getting a wheelchair today

## 2019-06-24 ENCOUNTER — Telehealth: Payer: Self-pay | Admitting: Family Medicine

## 2019-06-24 NOTE — Telephone Encounter (Signed)
Lmtcb.

## 2019-06-24 NOTE — Telephone Encounter (Signed)
Patient son states it is just getting a little more for him to handle can you order hospice on patient.

## 2019-06-25 ENCOUNTER — Encounter (HOSPITAL_COMMUNITY): Payer: Self-pay

## 2019-06-25 ENCOUNTER — Inpatient Hospital Stay (HOSPITAL_COMMUNITY)
Admission: EM | Admit: 2019-06-25 | Discharge: 2019-07-19 | DRG: 177 | Disposition: E | Payer: Medicare Other | Attending: Internal Medicine | Admitting: Internal Medicine

## 2019-06-25 ENCOUNTER — Other Ambulatory Visit: Payer: Self-pay | Admitting: Family Medicine

## 2019-06-25 ENCOUNTER — Telehealth: Payer: Self-pay | Admitting: Family Medicine

## 2019-06-25 ENCOUNTER — Other Ambulatory Visit: Payer: Self-pay

## 2019-06-25 ENCOUNTER — Emergency Department (HOSPITAL_COMMUNITY): Payer: Medicare Other

## 2019-06-25 DIAGNOSIS — Z20822 Contact with and (suspected) exposure to covid-19: Secondary | ICD-10-CM | POA: Diagnosis not present

## 2019-06-25 DIAGNOSIS — J431 Panlobular emphysema: Secondary | ICD-10-CM | POA: Diagnosis not present

## 2019-06-25 DIAGNOSIS — F05 Delirium due to known physiological condition: Secondary | ICD-10-CM | POA: Diagnosis present

## 2019-06-25 DIAGNOSIS — J449 Chronic obstructive pulmonary disease, unspecified: Secondary | ICD-10-CM

## 2019-06-25 DIAGNOSIS — Z808 Family history of malignant neoplasm of other organs or systems: Secondary | ICD-10-CM

## 2019-06-25 DIAGNOSIS — Z825 Family history of asthma and other chronic lower respiratory diseases: Secondary | ICD-10-CM

## 2019-06-25 DIAGNOSIS — Z515 Encounter for palliative care: Secondary | ICD-10-CM | POA: Diagnosis not present

## 2019-06-25 DIAGNOSIS — I4581 Long QT syndrome: Secondary | ICD-10-CM | POA: Diagnosis present

## 2019-06-25 DIAGNOSIS — Z66 Do not resuscitate: Secondary | ICD-10-CM | POA: Diagnosis present

## 2019-06-25 DIAGNOSIS — N179 Acute kidney failure, unspecified: Secondary | ICD-10-CM | POA: Diagnosis present

## 2019-06-25 DIAGNOSIS — F0390 Unspecified dementia without behavioral disturbance: Secondary | ICD-10-CM | POA: Diagnosis not present

## 2019-06-25 DIAGNOSIS — M545 Low back pain: Secondary | ICD-10-CM | POA: Diagnosis present

## 2019-06-25 DIAGNOSIS — I714 Abdominal aortic aneurysm, without rupture, unspecified: Secondary | ICD-10-CM | POA: Diagnosis present

## 2019-06-25 DIAGNOSIS — Z823 Family history of stroke: Secondary | ICD-10-CM

## 2019-06-25 DIAGNOSIS — R0602 Shortness of breath: Secondary | ICD-10-CM | POA: Diagnosis not present

## 2019-06-25 DIAGNOSIS — Z8049 Family history of malignant neoplasm of other genital organs: Secondary | ICD-10-CM

## 2019-06-25 DIAGNOSIS — J9601 Acute respiratory failure with hypoxia: Secondary | ICD-10-CM | POA: Diagnosis not present

## 2019-06-25 DIAGNOSIS — N401 Enlarged prostate with lower urinary tract symptoms: Secondary | ICD-10-CM

## 2019-06-25 DIAGNOSIS — I50812 Chronic right heart failure: Secondary | ICD-10-CM | POA: Diagnosis not present

## 2019-06-25 DIAGNOSIS — E785 Hyperlipidemia, unspecified: Secondary | ICD-10-CM | POA: Diagnosis present

## 2019-06-25 DIAGNOSIS — Z950 Presence of cardiac pacemaker: Secondary | ICD-10-CM | POA: Diagnosis not present

## 2019-06-25 DIAGNOSIS — I10 Essential (primary) hypertension: Secondary | ICD-10-CM | POA: Diagnosis present

## 2019-06-25 DIAGNOSIS — I480 Paroxysmal atrial fibrillation: Secondary | ICD-10-CM | POA: Diagnosis present

## 2019-06-25 DIAGNOSIS — I4891 Unspecified atrial fibrillation: Secondary | ICD-10-CM | POA: Diagnosis not present

## 2019-06-25 DIAGNOSIS — R195 Other fecal abnormalities: Secondary | ICD-10-CM | POA: Diagnosis present

## 2019-06-25 DIAGNOSIS — I420 Dilated cardiomyopathy: Secondary | ICD-10-CM | POA: Diagnosis present

## 2019-06-25 DIAGNOSIS — I739 Peripheral vascular disease, unspecified: Secondary | ICD-10-CM | POA: Diagnosis present

## 2019-06-25 DIAGNOSIS — J1282 Pneumonia due to coronavirus disease 2019: Secondary | ICD-10-CM | POA: Diagnosis present

## 2019-06-25 DIAGNOSIS — R0902 Hypoxemia: Secondary | ICD-10-CM | POA: Diagnosis not present

## 2019-06-25 DIAGNOSIS — R6520 Severe sepsis without septic shock: Secondary | ICD-10-CM

## 2019-06-25 DIAGNOSIS — I110 Hypertensive heart disease with heart failure: Secondary | ICD-10-CM | POA: Diagnosis present

## 2019-06-25 DIAGNOSIS — M419 Scoliosis, unspecified: Secondary | ICD-10-CM | POA: Diagnosis present

## 2019-06-25 DIAGNOSIS — Z7951 Long term (current) use of inhaled steroids: Secondary | ICD-10-CM

## 2019-06-25 DIAGNOSIS — K219 Gastro-esophageal reflux disease without esophagitis: Secondary | ICD-10-CM | POA: Diagnosis not present

## 2019-06-25 DIAGNOSIS — A419 Sepsis, unspecified organism: Secondary | ICD-10-CM

## 2019-06-25 DIAGNOSIS — IMO0002 Reserved for concepts with insufficient information to code with codable children: Secondary | ICD-10-CM

## 2019-06-25 DIAGNOSIS — R680 Hypothermia, not associated with low environmental temperature: Secondary | ICD-10-CM | POA: Diagnosis present

## 2019-06-25 DIAGNOSIS — J9621 Acute and chronic respiratory failure with hypoxia: Secondary | ICD-10-CM | POA: Diagnosis present

## 2019-06-25 DIAGNOSIS — D649 Anemia, unspecified: Secondary | ICD-10-CM | POA: Diagnosis present

## 2019-06-25 DIAGNOSIS — Y95 Nosocomial condition: Secondary | ICD-10-CM | POA: Diagnosis present

## 2019-06-25 DIAGNOSIS — Z7901 Long term (current) use of anticoagulants: Secondary | ICD-10-CM

## 2019-06-25 DIAGNOSIS — J189 Pneumonia, unspecified organism: Secondary | ICD-10-CM | POA: Diagnosis not present

## 2019-06-25 DIAGNOSIS — M1990 Unspecified osteoarthritis, unspecified site: Secondary | ICD-10-CM | POA: Diagnosis present

## 2019-06-25 DIAGNOSIS — K649 Unspecified hemorrhoids: Secondary | ICD-10-CM | POA: Diagnosis present

## 2019-06-25 DIAGNOSIS — F0280 Dementia in other diseases classified elsewhere without behavioral disturbance: Secondary | ICD-10-CM | POA: Diagnosis present

## 2019-06-25 DIAGNOSIS — E872 Acidosis: Secondary | ICD-10-CM | POA: Diagnosis present

## 2019-06-25 DIAGNOSIS — Q600 Renal agenesis, unilateral: Secondary | ICD-10-CM

## 2019-06-25 DIAGNOSIS — F03A0 Unspecified dementia, mild, without behavioral disturbance, psychotic disturbance, mood disturbance, and anxiety: Secondary | ICD-10-CM | POA: Diagnosis present

## 2019-06-25 DIAGNOSIS — Z888 Allergy status to other drugs, medicaments and biological substances status: Secondary | ICD-10-CM

## 2019-06-25 DIAGNOSIS — U071 COVID-19: Principal | ICD-10-CM

## 2019-06-25 DIAGNOSIS — Z955 Presence of coronary angioplasty implant and graft: Secondary | ICD-10-CM

## 2019-06-25 DIAGNOSIS — N400 Enlarged prostate without lower urinary tract symptoms: Secondary | ICD-10-CM | POA: Diagnosis present

## 2019-06-25 DIAGNOSIS — R404 Transient alteration of awareness: Secondary | ICD-10-CM | POA: Diagnosis not present

## 2019-06-25 DIAGNOSIS — R410 Disorientation, unspecified: Secondary | ICD-10-CM | POA: Diagnosis not present

## 2019-06-25 DIAGNOSIS — Z87891 Personal history of nicotine dependence: Secondary | ICD-10-CM

## 2019-06-25 DIAGNOSIS — R3914 Feeling of incomplete bladder emptying: Secondary | ICD-10-CM

## 2019-06-25 DIAGNOSIS — G309 Alzheimer's disease, unspecified: Secondary | ICD-10-CM | POA: Diagnosis present

## 2019-06-25 DIAGNOSIS — R231 Pallor: Secondary | ICD-10-CM | POA: Diagnosis not present

## 2019-06-25 DIAGNOSIS — J690 Pneumonitis due to inhalation of food and vomit: Secondary | ICD-10-CM | POA: Diagnosis present

## 2019-06-25 DIAGNOSIS — Z79899 Other long term (current) drug therapy: Secondary | ICD-10-CM

## 2019-06-25 DIAGNOSIS — Z8249 Family history of ischemic heart disease and other diseases of the circulatory system: Secondary | ICD-10-CM

## 2019-06-25 DIAGNOSIS — J439 Emphysema, unspecified: Secondary | ICD-10-CM | POA: Diagnosis not present

## 2019-06-25 DIAGNOSIS — J9600 Acute respiratory failure, unspecified whether with hypoxia or hypercapnia: Secondary | ICD-10-CM | POA: Diagnosis not present

## 2019-06-25 DIAGNOSIS — R451 Restlessness and agitation: Secondary | ICD-10-CM | POA: Diagnosis not present

## 2019-06-25 DIAGNOSIS — E46 Unspecified protein-calorie malnutrition: Secondary | ICD-10-CM | POA: Diagnosis present

## 2019-06-25 DIAGNOSIS — R1310 Dysphagia, unspecified: Secondary | ICD-10-CM | POA: Diagnosis present

## 2019-06-25 DIAGNOSIS — Z7189 Other specified counseling: Secondary | ICD-10-CM | POA: Diagnosis not present

## 2019-06-25 DIAGNOSIS — K5790 Diverticulosis of intestine, part unspecified, without perforation or abscess without bleeding: Secondary | ICD-10-CM | POA: Diagnosis present

## 2019-06-25 DIAGNOSIS — L409 Psoriasis, unspecified: Secondary | ICD-10-CM | POA: Diagnosis present

## 2019-06-25 DIAGNOSIS — H3530 Unspecified macular degeneration: Secondary | ICD-10-CM | POA: Diagnosis present

## 2019-06-25 DIAGNOSIS — Z9981 Dependence on supplemental oxygen: Secondary | ICD-10-CM

## 2019-06-25 DIAGNOSIS — I2510 Atherosclerotic heart disease of native coronary artery without angina pectoris: Secondary | ICD-10-CM | POA: Diagnosis present

## 2019-06-25 DIAGNOSIS — R739 Hyperglycemia, unspecified: Secondary | ICD-10-CM | POA: Diagnosis not present

## 2019-06-25 DIAGNOSIS — G8929 Other chronic pain: Secondary | ICD-10-CM | POA: Diagnosis present

## 2019-06-25 DIAGNOSIS — J440 Chronic obstructive pulmonary disease with acute lower respiratory infection: Secondary | ICD-10-CM | POA: Diagnosis present

## 2019-06-25 DIAGNOSIS — R0689 Other abnormalities of breathing: Secondary | ICD-10-CM | POA: Diagnosis not present

## 2019-06-25 LAB — POCT I-STAT EG7
Acid-base deficit: 5 mmol/L — ABNORMAL HIGH (ref 0.0–2.0)
Bicarbonate: 20.7 mmol/L (ref 20.0–28.0)
Calcium, Ion: 1.06 mmol/L — ABNORMAL LOW (ref 1.15–1.40)
HCT: 31 % — ABNORMAL LOW (ref 39.0–52.0)
Hemoglobin: 10.5 g/dL — ABNORMAL LOW (ref 13.0–17.0)
O2 Saturation: 72 %
Potassium: 4.5 mmol/L (ref 3.5–5.1)
Sodium: 143 mmol/L (ref 135–145)
TCO2: 22 mmol/L (ref 22–32)
pCO2, Ven: 41.1 mmHg — ABNORMAL LOW (ref 44.0–60.0)
pH, Ven: 7.309 (ref 7.250–7.430)
pO2, Ven: 42 mmHg (ref 32.0–45.0)

## 2019-06-25 LAB — CBC WITH DIFFERENTIAL/PLATELET
Abs Immature Granulocytes: 0.07 10*3/uL (ref 0.00–0.07)
Basophils Absolute: 0 10*3/uL (ref 0.0–0.1)
Basophils Relative: 0 %
Eosinophils Absolute: 0 10*3/uL (ref 0.0–0.5)
Eosinophils Relative: 0 %
HCT: 34 % — ABNORMAL LOW (ref 39.0–52.0)
Hemoglobin: 10.6 g/dL — ABNORMAL LOW (ref 13.0–17.0)
Immature Granulocytes: 1 %
Lymphocytes Relative: 9 %
Lymphs Abs: 0.5 10*3/uL — ABNORMAL LOW (ref 0.7–4.0)
MCH: 30.6 pg (ref 26.0–34.0)
MCHC: 31.2 g/dL (ref 30.0–36.0)
MCV: 98.3 fL (ref 80.0–100.0)
Monocytes Absolute: 0.3 10*3/uL (ref 0.1–1.0)
Monocytes Relative: 5 %
Neutro Abs: 4.7 10*3/uL (ref 1.7–7.7)
Neutrophils Relative %: 85 %
Platelets: 181 10*3/uL (ref 150–400)
RBC: 3.46 MIL/uL — ABNORMAL LOW (ref 4.22–5.81)
RDW: 16.5 % — ABNORMAL HIGH (ref 11.5–15.5)
WBC: 5.5 10*3/uL (ref 4.0–10.5)
nRBC: 0 % (ref 0.0–0.2)

## 2019-06-25 LAB — URINALYSIS, ROUTINE W REFLEX MICROSCOPIC
Bilirubin Urine: NEGATIVE
Glucose, UA: NEGATIVE mg/dL
Hgb urine dipstick: NEGATIVE
Ketones, ur: NEGATIVE mg/dL
Nitrite: NEGATIVE
Protein, ur: 100 mg/dL — AB
Specific Gravity, Urine: 1.027 (ref 1.005–1.030)
pH: 5 (ref 5.0–8.0)

## 2019-06-25 LAB — COMPREHENSIVE METABOLIC PANEL
ALT: 88 U/L — ABNORMAL HIGH (ref 0–44)
AST: 143 U/L — ABNORMAL HIGH (ref 15–41)
Albumin: 2.3 g/dL — ABNORMAL LOW (ref 3.5–5.0)
Alkaline Phosphatase: 170 U/L — ABNORMAL HIGH (ref 38–126)
Anion gap: 14 (ref 5–15)
BUN: 53 mg/dL — ABNORMAL HIGH (ref 8–23)
CO2: 20 mmol/L — ABNORMAL LOW (ref 22–32)
Calcium: 8.2 mg/dL — ABNORMAL LOW (ref 8.9–10.3)
Chloride: 109 mmol/L (ref 98–111)
Creatinine, Ser: 1.39 mg/dL — ABNORMAL HIGH (ref 0.61–1.24)
GFR calc Af Amer: 52 mL/min — ABNORMAL LOW (ref >60)
GFR calc non Af Amer: 45 mL/min — ABNORMAL LOW (ref >60)
Glucose, Bld: 123 mg/dL — ABNORMAL HIGH (ref 70–99)
Potassium: 5.7 mmol/L — ABNORMAL HIGH (ref 3.5–5.1)
Sodium: 143 mmol/L (ref 135–145)
Total Bilirubin: 1.7 mg/dL — ABNORMAL HIGH (ref 0.3–1.2)
Total Protein: 5.8 g/dL — ABNORMAL LOW (ref 6.5–8.1)

## 2019-06-25 LAB — APTT: aPTT: 42 seconds — ABNORMAL HIGH (ref 24–36)

## 2019-06-25 LAB — PROTIME-INR
INR: 2.6 — ABNORMAL HIGH (ref 0.8–1.2)
Prothrombin Time: 27.4 seconds — ABNORMAL HIGH (ref 11.4–15.2)

## 2019-06-25 LAB — POC OCCULT BLOOD, ED: Fecal Occult Bld: POSITIVE — AB

## 2019-06-25 LAB — RESPIRATORY PANEL BY RT PCR (FLU A&B, COVID)
Influenza A by PCR: NEGATIVE
Influenza B by PCR: NEGATIVE
SARS Coronavirus 2 by RT PCR: POSITIVE — AB

## 2019-06-25 LAB — D-DIMER, QUANTITATIVE: D-Dimer, Quant: 5.07 ug/mL-FEU — ABNORMAL HIGH (ref 0.00–0.50)

## 2019-06-25 LAB — TYPE AND SCREEN
ABO/RH(D): A POS
Antibody Screen: NEGATIVE

## 2019-06-25 LAB — LACTIC ACID, PLASMA
Lactic Acid, Venous: 3 mmol/L (ref 0.5–1.9)
Lactic Acid, Venous: 2.2 mmol/L (ref 0.5–1.9)

## 2019-06-25 LAB — BRAIN NATRIURETIC PEPTIDE: B Natriuretic Peptide: 341.9 pg/mL — ABNORMAL HIGH (ref 0.0–100.0)

## 2019-06-25 LAB — PROCALCITONIN: Procalcitonin: 0.69 ng/mL

## 2019-06-25 MED ORDER — FOOD THICKENER (SIMPLYTHICK)
1.0000 | ORAL | Status: DC | PRN
  Filled 2019-06-25: qty 1

## 2019-06-25 MED ORDER — SODIUM CHLORIDE 0.9 % IV SOLN: INTRAVENOUS | Status: DC

## 2019-06-25 MED ORDER — LACTATED RINGERS IV BOLUS
1000.0000 mL | Freq: Once | INTRAVENOUS | Status: AC
  Administered 2019-06-25: 1000 mL via INTRAVENOUS

## 2019-06-25 MED ORDER — LEVOFLOXACIN IN D5W 750 MG/150ML IV SOLN: 750.0000 mg | INTRAVENOUS | Status: DC

## 2019-06-25 MED ORDER — LEVOFLOXACIN IN D5W 750 MG/150ML IV SOLN
750.0000 mg | Freq: Once | INTRAVENOUS | Status: AC
  Administered 2019-06-25: 750 mg via INTRAVENOUS
  Filled 2019-06-25: qty 150

## 2019-06-25 MED ORDER — APIXABAN 5 MG PO TABS
5.0000 mg | ORAL_TABLET | Freq: Two times a day (BID) | ORAL | Status: DC
  Administered 2019-06-25 – 2019-06-27 (×4): 5 mg via ORAL
  Filled 2019-06-25 (×5): qty 1

## 2019-06-25 MED ORDER — SODIUM CHLORIDE 0.9 % IV SOLN
200.0000 mg | Freq: Once | INTRAVENOUS | Status: AC
  Administered 2019-06-25: 200 mg via INTRAVENOUS
  Filled 2019-06-25: qty 200

## 2019-06-25 MED ORDER — METOPROLOL TARTRATE 25 MG PO TABS
25.0000 mg | ORAL_TABLET | Freq: Two times a day (BID) | ORAL | Status: DC
  Administered 2019-06-25 – 2019-06-27 (×4): 25 mg via ORAL
  Filled 2019-06-25 (×4): qty 1

## 2019-06-25 MED ORDER — ISOSORBIDE MONONITRATE ER 30 MG PO TB24
30.0000 mg | ORAL_TABLET | Freq: Every day | ORAL | Status: DC
  Administered 2019-06-26 – 2019-06-27 (×2): 30 mg via ORAL
  Filled 2019-06-25 (×2): qty 1

## 2019-06-25 MED ORDER — GUAIFENESIN-DM 100-10 MG/5ML PO SYRP: 10.0000 mL | ORAL_SOLUTION | ORAL | Status: DC | PRN

## 2019-06-25 MED ORDER — TAMSULOSIN HCL 0.4 MG PO CAPS
0.8000 mg | ORAL_CAPSULE | Freq: Every day | ORAL | Status: DC
  Administered 2019-06-25 – 2019-06-26 (×2): 0.8 mg via ORAL
  Filled 2019-06-25 (×2): qty 2

## 2019-06-25 MED ORDER — IPRATROPIUM-ALBUTEROL 20-100 MCG/ACT IN AERS
1.0000 | INHALATION_SPRAY | RESPIRATORY_TRACT | Status: DC | PRN
  Administered 2019-06-27: 1 via RESPIRATORY_TRACT

## 2019-06-25 MED ORDER — ASCORBIC ACID 500 MG PO TABS
500.0000 mg | ORAL_TABLET | Freq: Every day | ORAL | Status: DC
  Administered 2019-06-26 – 2019-06-27 (×2): 500 mg via ORAL
  Filled 2019-06-25 (×2): qty 1

## 2019-06-25 MED ORDER — LORATADINE 10 MG PO TABS
10.0000 mg | ORAL_TABLET | Freq: Every day | ORAL | Status: DC
  Administered 2019-06-26 – 2019-06-27 (×2): 10 mg via ORAL
  Filled 2019-06-25 (×2): qty 1

## 2019-06-25 MED ORDER — POLYETHYLENE GLYCOL 3350 17 G PO PACK: 17.0000 g | PACK | Freq: Every day | ORAL | Status: DC | PRN

## 2019-06-25 MED ORDER — ROSUVASTATIN CALCIUM 5 MG PO TABS
5.0000 mg | ORAL_TABLET | Freq: Every day | ORAL | Status: DC
  Administered 2019-06-25 – 2019-06-26 (×2): 5 mg via ORAL
  Filled 2019-06-25 (×2): qty 1

## 2019-06-25 MED ORDER — FINASTERIDE 5 MG PO TABS
5.0000 mg | ORAL_TABLET | Freq: Every day | ORAL | Status: DC
  Administered 2019-06-26 – 2019-06-27 (×2): 5 mg via ORAL
  Filled 2019-06-25 (×3): qty 1

## 2019-06-25 MED ORDER — DONEPEZIL HCL 10 MG PO TABS
10.0000 mg | ORAL_TABLET | Freq: Every day | ORAL | Status: DC
  Administered 2019-06-26 – 2019-06-27 (×2): 10 mg via ORAL
  Filled 2019-06-25 (×2): qty 1

## 2019-06-25 MED ORDER — SODIUM CHLORIDE 0.9 % IV SOLN
100.0000 mg | Freq: Every day | INTRAVENOUS | Status: DC
  Administered 2019-06-26 – 2019-06-27 (×2): 100 mg via INTRAVENOUS
  Filled 2019-06-25 (×2): qty 20

## 2019-06-25 MED ORDER — FLUTICASONE PROPIONATE 50 MCG/ACT NA SUSP
2.0000 | Freq: Every day | NASAL | Status: DC | PRN
  Filled 2019-06-25: qty 16

## 2019-06-25 MED ORDER — SODIUM CHLORIDE 0.9 % IV SOLN: 1.5000 g | Freq: Four times a day (QID) | INTRAVENOUS | Status: DC

## 2019-06-25 MED ORDER — ZINC SULFATE 220 (50 ZN) MG PO CAPS
220.0000 mg | ORAL_CAPSULE | Freq: Every day | ORAL | Status: DC
  Administered 2019-06-25 – 2019-06-27 (×3): 220 mg via ORAL
  Filled 2019-06-25 (×3): qty 1

## 2019-06-25 MED ORDER — OLANZAPINE 5 MG PO TBDP
5.0000 mg | ORAL_TABLET | Freq: Two times a day (BID) | ORAL | Status: DC | PRN
  Administered 2019-06-25 – 2019-06-26 (×2): 5 mg via ORAL
  Filled 2019-06-25 (×4): qty 1

## 2019-06-25 MED ORDER — MEMANTINE HCL ER 28 MG PO CP24
28.0000 mg | ORAL_CAPSULE | Freq: Every day | ORAL | Status: DC
  Administered 2019-06-25 – 2019-06-26 (×2): 28 mg via ORAL
  Filled 2019-06-25 (×3): qty 1

## 2019-06-25 MED ORDER — SODIUM CHLORIDE 0.9 % IV SOLN: INTRAVENOUS | Status: AC

## 2019-06-25 MED ORDER — BISACODYL 5 MG PO TBEC: 5.0000 mg | DELAYED_RELEASE_TABLET | Freq: Every day | ORAL | Status: DC | PRN

## 2019-06-25 MED ORDER — IPRATROPIUM-ALBUTEROL 20-100 MCG/ACT IN AERS
1.0000 | INHALATION_SPRAY | Freq: Four times a day (QID) | RESPIRATORY_TRACT | Status: DC
  Administered 2019-06-25 – 2019-06-27 (×4): 1 via RESPIRATORY_TRACT
  Filled 2019-06-25: qty 4

## 2019-06-25 MED ORDER — FUROSEMIDE 20 MG PO TABS: 20.0000 mg | ORAL_TABLET | Freq: Every day | ORAL | Status: DC

## 2019-06-25 MED ORDER — ACETAMINOPHEN 325 MG PO TABS: 650.0000 mg | ORAL_TABLET | Freq: Four times a day (QID) | ORAL | Status: DC | PRN

## 2019-06-25 MED ORDER — ONDANSETRON HCL 4 MG PO TABS: 4.0000 mg | ORAL_TABLET | Freq: Three times a day (TID) | ORAL | Status: DC | PRN

## 2019-06-25 MED ORDER — RESOURCE THICKENUP CLEAR PO POWD
ORAL | Status: DC | PRN
  Filled 2019-06-25: qty 125

## 2019-06-25 MED ORDER — HYDROCOD POLST-CPM POLST ER 10-8 MG/5ML PO SUER: 5.0000 mL | Freq: Two times a day (BID) | ORAL | Status: DC | PRN

## 2019-06-25 MED ORDER — DEXAMETHASONE 4 MG PO TABS: 6.0000 mg | ORAL_TABLET | ORAL | Status: DC

## 2019-06-25 MED ORDER — FLUTICASONE FUROATE-VILANTEROL 100-25 MCG/INH IN AEPB
1.0000 | INHALATION_SPRAY | Freq: Every day | RESPIRATORY_TRACT | Status: DC
  Administered 2019-06-26: 1 via RESPIRATORY_TRACT
  Filled 2019-06-25: qty 28

## 2019-06-25 MED ORDER — IPRATROPIUM-ALBUTEROL 20-100 MCG/ACT IN AERS: 1.0000 | INHALATION_SPRAY | Freq: Four times a day (QID) | RESPIRATORY_TRACT | Status: DC | PRN

## 2019-06-25 MED ORDER — DEXAMETHASONE SODIUM PHOSPHATE 10 MG/ML IJ SOLN
6.0000 mg | Freq: Every day | INTRAMUSCULAR | Status: DC
  Administered 2019-06-25 – 2019-06-27 (×3): 6 mg via INTRAVENOUS
  Filled 2019-06-25 (×3): qty 1

## 2019-06-25 MED ORDER — POTASSIUM CHLORIDE CRYS ER 20 MEQ PO TBCR
20.0000 meq | EXTENDED_RELEASE_TABLET | Freq: Every day | ORAL | Status: DC
  Administered 2019-06-26 – 2019-06-27 (×2): 20 meq via ORAL
  Filled 2019-06-25 (×2): qty 1

## 2019-06-25 MED ORDER — CALCIUM GLUCONATE-NACL 1-0.675 GM/50ML-% IV SOLN
1.0000 g | Freq: Once | INTRAVENOUS | Status: AC
  Administered 2019-06-25: 1000 mg via INTRAVENOUS
  Filled 2019-06-25: qty 50

## 2019-06-25 NOTE — H&P (Addendum)
History and Physical    DOA: 07/17/2019  PCP: [name], MD  Patient coming from: home  Chief Complaint: Confusion  HPI: Brad [name] is a 86 y.o. male with history h/o PAF on Eliquis; macular degeneration; HTN; HLD,GERD; CAD s/p stent, s/ppacemaker placement; s/p AAA repair and COPD who was hospitalized last month 1/16-1/19 for acute hypoxic respiratory failure and treated for COPD/CHF exacerbation and discharged home without any home O2 needs, now presents in concern for cognitive/functional decline with worsening confusion.  Family states he is gotten weaker and somewhat dyspneic on walking.  This morning he was trying to walk with a walker and his legs gave away which prompted ED visit.  Per last discharge summary, son had reported noticing choking while patient having breakfast and dinner, speech therapy eval was obtained who recommended dysphagia diet upon discharge.  Hospital course was complicated by episodes of delirium/sundowning-responded well to conservative measures.  Patient has been living with his son, [name]. Patient tested negative for Covid in last admission but his other son, Jishnu, who had recently recovered from Covid was visiting him occasionally.  According to [name], patient followed up with PCP regarding dysphagia diet recommendations as he was not happy with using thickener in liquids.  He had speech therapy follow-up as outpatient who apparently advised regular diet consistency with small bites and small sips of thin liquids.  Patient however has been using meds and pured diet.   ED course: Afebrile, mildly hypothermic at 96.4 rectally.  Patient found to be hypoxic, tachypneic upon arrival here and even with 5 L nasal cannula only satting in the mid 80s.  Patient was placed on high flow 10 L nasal cannula with significant improvement with sats in the mid 90s.  Labs show WBC 5.5, hemoglobin 10.6, platelet 181, sodium 143, potassium 5.7 (repeat level 4.5) chloride 109, bicarb  20, BUN 53, creatinine 1.39, calcium 8.2, ionized calcium slightly low at 1.06, lactate at 3, glucose 123, T bili 1.7, ALP 170, albumin 2.3, BNP 341.  Chest x-ray suggestive of bilateral worsening pneumonia (RUL and LLL), code sepsis initiated in the ED.  He received Levaquin and 1 L of fluid bolus with LR.  UA pending.  Covid PCR pending.  Patient currently pleasantly confused but communicative and oriented to person only.   Review of Systems: As per HPI otherwise 10 point review of systems negative.    Past Medical History:  Diagnosis Date  . Acute on chronic respiratory failure ([place]) 06/06/2019  . CAD (coronary artery disease)    s/p stent to OM2 in past;  Last LHC 3/05: EF 60%, left RA occluded, right RA ok, prox to mid LAD 60%, oD1 75-80%, oD2 small 75%, pOM1 50%, OM2 stent ok with 50% before stent, mRCA 30%, dRCA 30%, PDA 70%.  Last dobutamine myoview 5/06: no scar or ischemia, EF 60%.  . Chronic lower back pain   . COPD (chronic obstructive pulmonary disease) ([place])   . Dementia ([place])   . DJD (degenerative joint disease)   . Enlarged prostate   . Ethanolism ([place])   . GERD (gastroesophageal reflux disease)   . Hemorrhoids   . Hernia, abdominal    "he has ~ 4; wears binder" (11/20/2015)  . HLD (hyperlipidemia)   . HTN (hypertension)    Last echo 1/07:  Normal LVF, LV thickness upper limits of normal, mild aortic root dilatation, mild to mod MAC, mild LAE, borderline RVH, mild RAE.  . Macular degeneration   . Pacemaker  DR Radford Pax; a. s/p MDT dual chamber PPM with His Bundle pacing  . PAD (peripheral artery disease) ([place])   . Paroxysmal atrial fibrillation (H[facility])   . Psoriasis   . S/P AAA repair    Dr. Roxan Hockey  . Scoliosis   . Tobacco abuse   . Unilateral congenital absence of kidney     Past Surgical History:  Procedure Laterality Date  . APPENDECTOMY    . CARPAL TUNNEL RELEASE Right 12/26/2014   Procedure: RIGHT ENDOSCOPIC CARPAL TUNNEL SYNDROME RELEASE;  Surgeon: Milly Jakob, MD;  Location: [facility];  Service: Orthopedics;  Laterality: Right;  . CATARACT EXTRACTION, BILATERAL Bilateral   . CORONARY ANGIOPLASTY WITH STENT PLACEMENT     s/p stent to OM2 in past;   . EP IMPLANTABLE DEVICE N/A 11/20/2015   Procedure: Pacemaker Implant;  Surgeon: Evans Lance, MD;  Location: [facility];  Service: Cardiovascular;  Laterality: N/A;  . HERNIA REPAIR    . INSERT / REPLACE / REMOVE PACEMAKER  11/20/2015  . RESECTION AND GRAFTING OF INFARENAL  ABDOMINAL  AORTIC ANEURYSM  05/03/2002    Social history:  reports that he quit smoking about 23 years ago. His smoking use included cigarettes. He has a 130.00 pack-year smoking history. He has never used smokeless tobacco. He reports current alcohol use. He reports that he does not use drugs.   Allergies  Allergen Reactions  . Aspirin Shortness Of Breath and Swelling  . Atorvastatin Other (See Comments)    Leg pain, feet pain  . Cephalexin Other (See Comments)    Unknown reaction    Family History  Problem Relation Age of Onset  . Cancer Mother        uterine  . Stroke Mother        hemi plagic  . Cancer Sister   . COPD Sister   . Early death Brother   . Cancer Brother        Lip  . Stroke Father   . AAA (abdominal aortic aneurysm) Son   . Arthritis Son        knee  . COPD Brother   . Cancer Son        testicular   . Hypertension Son   . Early death Sister       Prior to Admission medications   Medication Sig Start Date End Date Taking? Authorizing Provider  acetaminophen (TYLENOL ARTHRITIS PAIN) 650 MG CR tablet Take 1,300 mg by mouth 2 (two) times a day.    Yes [provider]  donepezil (ARICEPT) 10 MG tablet Take 1 tablet daily Patient taking differently: Take 10 mg by mouth daily.  02/12/19  Yes Cameron Sprang, MD  ELIQUIS 5 MG TABS tablet TAKE 1 TABLET BY MOUTH TWICE A DAY Patient taking differently: Take 5 mg by mouth 2 (two) times daily.  04/05/19  Yes  Stacks, Cletus Gash, MD  fexofenadine (ALLEGRA) 180 MG tablet TAKE 1 TABLET (180 MG TOTAL) BY MOUTH DAILY. FOR ALLERGY SYMPTOMS 03/23/19  Yes Stacks, Cletus Gash, MD  finasteride (PROSCAR) 5 MG tablet TAKE 1 TABLET BY MOUTH EVERY DAY Patient taking differently: Take 5 mg by mouth daily.  06/01/19  Yes Stacks, Cletus Gash, MD  fluticasone (FLONASE) 50 MCG/ACT nasal spray SPRAY 2 SPRAYS INTO EACH NOSTRIL EVERY DAY Patient taking differently: Place 2 sprays into both nostrils daily as needed for allergies.  11/10/17  Yes [name], MD  fluticasone furoate-vilanterol (BREO ELLIPTA) 100-25 MCG/INH AEPB Inhale 1  puff into the lungs daily. 06/16/19  Yes [name], MD  food thickener (SIMPLYTHICK) POWD Take 1 packet by mouth as needed. 06/08/19 07/08/19 Yes Guilford Shi, MD  furosemide (LASIX) 20 MG tablet Take 1 tablet (20 mg total) by mouth daily. 06/08/19 06/07/20 Yes Guilford Shi, MD  Ipratropium-Albuterol (COMBIVENT RESPIMAT) 20-100 MCG/ACT AERS respimat Inhale 1 puff into the lungs every 6 (six) hours as needed for wheezing or shortness of breath. 06/08/19 07/08/19 Yes Guilford Shi, MD  isosorbide mononitrate (IMDUR) 30 MG 24 hr tablet TAKE 1 TABLET (30 MG TOTAL) BY MOUTH DAILY. (NEEDS TO BE SEEN BEFORE NEXT REFILL) 06/14/19  Yes Stacks, Cletus Gash, MD  KLOR-CON M20 20 MEQ tablet TAKE 1 TABLET BY MOUTH EVERY DAY Patient taking differently: Take 20 mEq by mouth daily.  06/01/19  Yes [name], MD  memantine (NAMENDA XR) 28 MG CP24 24 hr capsule Take 1 capsule (28 mg total) by mouth daily. Needs to be seen for further refills. Patient taking differently: Take 28 mg by mouth daily with supper. Needs to be seen for further refills. 04/06/19  Yes Stacks, Cletus Gash, MD  metoprolol tartrate (LOPRESSOR) 25 MG tablet TAKE 1 TABLET BY MOUTH TWICE A DAY Patient taking differently: Take 25 mg by mouth 2 (two) times daily.  06/14/19  Yes [name], MD  ondansetron (ZOFRAN) 4 MG tablet Take 1 tablet (4 mg total) by  mouth every 8 (eight) hours as needed for nausea or vomiting. 06/02/19  Yes Loman Brooklyn, FNP  psyllium (METAMUCIL SMOOTH TEXTURE) 58.6 % powder Take 1 packet by mouth 3 (three) times daily. Patient taking differently: Take 1 packet by mouth daily as needed (constipation).  12/27/18  Yes Law, [place], PA-C  rosuvastatin (CRESTOR) 5 MG tablet Take 1 tablet (5 mg total) by mouth daily with supper. 06/14/19  Yes [name], MD  Spacer/Aero-Holding Chambers (AEROCHAMBER MV) inhaler Use as instructed 06/02/19  Yes Hendricks Limes F, FNP  tamsulosin (FLOMAX) 0.4 MG CAPS capsule TAKE 2 CAPSULES (0.8 MG TOTAL) BY MOUTH AT BEDTIME. 06/14/19  Yes [name], MD    Physical Exam: Vitals:   07/18/2019 1245 07/05/2019 1345 06/21/2019 1400 06/24/2019 1445  BP: 125/89 125/90 (!) 121/96 117/87  Pulse: 63 (!) 101 90 93  Resp: (!) 21 19 (!) 25 (!) 29  Temp:      TempSrc:      SpO2: 95% 92% 96% 94%  Weight:      Height:        Constitutional: Pleasantly confused, pulled out O2 and O2 sats less than 80 on my arrival.  Upon placing back on O2 high flow, O2 sats picked up to 92 percent Eyes: PERRL, lids and conjunctivae normal ENMT: Mucous membranes are moist. Posterior pharynx clear of any exudate or lesions.Normal dentition.  Neck: normal, supple, no masses, no thyromegaly Respiratory: Scattered rhonchi and left basal crepts.  Increased work of breathing on talking full sentences but improved after placing him back on O2 nasal cannula. Cardiovascular: Regular rate and rhythm, no murmurs / rubs / gallops. No extremity edema. 2+ pedal pulses. No carotid bruits.  Abdomen: no tenderness, no masses palpated. No hepatosplenomegaly. Bowel sounds positive.  Musculoskeletal: no clubbing / cyanosis. No joint deformity upper and lower extremities. Good ROM, no contractures. Normal muscle tone.  Neurologic: CN 2-12 grossly intact. Sensation intact, patient moving all extremities equally with good  strength. Psychiatric: Impaired judgment and insight due to baseline cognitive dysfunction, pleasantly confused but awake, communicative and oriented x1.  Normal mood.  SKIN/catheters: no rashes, lesions, ulcers. No induration  Labs on Admission: I have personally reviewed following labs and imaging studies  CBC: Recent Labs  Lab 06/22/2019 1240 06/22/2019 1246  WBC 5.5  --   NEUTROABS 4.7  --   HGB 10.6* 10.5*  HCT 34.0* 31.0*  MCV 98.3  --   PLT 181  --    Basic Metabolic Panel: Recent Labs  Lab 07/02/2019 1240 06/26/2019 1246  NA 143 143  K 5.7* 4.5  CL 109  --   CO2 20*  --   GLUCOSE 123*  --   BUN 53*  --   CREATININE 1.39*  --   CALCIUM 8.2*  --    GFR: Estimated Creatinine Clearance: 35.3 mL/min (A) (by C-G formula based on SCr of 1.39 mg/dL (H)). Recent Labs  Lab 06/22/2019 1240  WBC 5.5  LATICACIDVEN 3.0*   Liver Function Tests: Recent Labs  Lab 06/30/2019 1240  AST 143*  ALT 88*  ALKPHOS 170*  BILITOT 1.7*  PROT 5.8*  ALBUMIN 2.3*   No results for input(s): LIPASE, AMYLASE in the last 168 hours. No results for input(s): AMMONIA in the last 168 hours. Coagulation Profile: Recent Labs  Lab 06/23/2019 1345  INR 2.6*   Cardiac Enzymes: No results for input(s): CKTOTAL, CKMB, CKMBINDEX, TROPONINI in the last 168 hours. BNP (last 3 results) No results for input(s): PROBNP in the last 8760 hours. HbA1C: No results for input(s): HGBA1C in the last 72 hours. CBG: No results for input(s): GLUCAP in the last 168 hours. Lipid Profile: No results for input(s): CHOL, HDL, LDLCALC, TRIG, CHOLHDL, LDLDIRECT in the last 72 hours. Thyroid Function Tests: No results for input(s): TSH, T4TOTAL, FREET4, T3FREE, THYROIDAB in the last 72 hours. Anemia Panel: No results for input(s): VITAMINB12, FOLATE, FERRITIN, TIBC, IRON, RETICCTPCT in the last 72 hours. Urine analysis:    Component Value Date/Time   COLORURINE AMBER (A) 07/11/2019 1513   APPEARANCEUR CLOUDY (A)  07/02/2019 1513   APPEARANCEUR Clear 04/29/2019 1401   LABSPEC 1.027 07/04/2019 1513   PHURINE 5.0 07/01/2019 1513   GLUCOSEU NEGATIVE 07/10/2019 1513   HGBUR NEGATIVE 07/13/2019 1513   BILIRUBINUR NEGATIVE 06/30/2019 1513   BILIRUBINUR Negative 04/29/2019 1401   KETONESUR NEGATIVE 07/07/2019 1513   PROTEINUR 100 (A) 07/05/2019 1513   UROBILINOGEN negative 07/07/2015 1054   NITRITE NEGATIVE 07/18/2019 1513   LEUKOCYTESUR MODERATE (A) 07/15/2019 1513    Radiological Exams on Admission: Personally reviewed  DG Chest Port 1 View  Result Date: 07/03/2019 CLINICAL DATA:  Hypoxia. Shortness of breath. EXAM: PORTABLE CHEST 1 VIEW COMPARISON:  June 08, 2019. FINDINGS: Stable cardiomegaly. Atherosclerosis of thoracic aorta is noted. Left-sided pacemaker is unchanged in position. Increased bilateral right upper lobe and left lower lobe opacities are noted concerning for worsening pneumonia. No pneumothorax or pleural effusion is noted. Bony thorax is unremarkable. IMPRESSION: Worsening bilateral right upper lobe and left lower lobe opacities are noted concerning for worsening pneumonia. Aortic Atherosclerosis (ICD10-I70.0). Electronically Signed   By: Marijo Conception M.D.   On: 07/02/2019 12:26    EKG: Not done today, last EKG on 1/16 showed atrial fibrillation with RBBB and QTc 488 ms     Assessment and Plan:   Principal Problem:   Acute respiratory failure with hypoxia ([place]) Active Problems:   Sepsis ([place])   Bilateral pneumonia   AKI (acute kidney injury) ([place])   Essential hypertension   COPD (chronic obstructive pulmonary disease) ([place])  Aneurysm of abdominal vessel (H[facility])   PVD   GERD   BPH (benign prostatic hyperplasia)   Mild dementia ([facility])   Dementia due to Alzheimer's disease ([place])   Pacemaker   Chronic right-sided CHF (congestive heart failure) ([place])   Solitary kidney    1.  Acute hypoxic respiratory failure and sepsis syndrome secondary to bilateral pneumonia:  Differentials include COVID-19 pneumonia versus aspiration pneumonia versus CAP.  Recently hospitalized and treated for COPD exacerbation.  He was also advised aspiration precautions and dysphagia diet.  Patient appears to have been unhappy with modified diet and per PCP follow-up note, was set up for speech therapy follow-up for a better diet regimen.  Doubt CHF component given BNP less than 500 and clinical appearance.  Will hold home diuretics for now. Addendum: Covid screen resulted positive.  Will admit to [facility] with IV Decadron, remdesivir and oral vitamins..  Taper O2 as tolerated.  Currently on 10 L high flow O2 but patient pulling it off occasionally due to confusion.  Follow-up inflammatory markers including C-reactive protein, D-dimer, ferritin, LDH and procalcitonin.  Patient received Levaquin in the ED.  Will defer further antibiotics unless procalcitonin elevated-can consider Unasyn to cover aspiration organisms.  2. AKI: Patient has solitary right kidney but his creatinine historically has been stable and less than 1.  Currently creatinine at 1.3.  He could have been dehydrated in the setting of diuretic use and poor oral intake in terms of free water as advised to use thickens upon last discharge.  Patient received IV fluids in the ED.  Will hold diuretics and repeat labs in a.m with continuous ginger hydration at 50 mL/h.  3.  COPD/emphysema: Was sent home with prednisone taper upon last discharge.  4.   Leg edema/chronic right-sided CHF: In last admission,chest x-ray did not show any pulmonary edema.  Patient did have lower extremity edema on presentation and echo shows normal LV function, mildly reduced RV systolic function.  Patient was treated with IV diuretics and transitioned to oral Lasix 20 daily with instructions to take daily at home for a week subsequent to which as needed dosing was advised--PCP on follow-up felt he was close to euvolemic and recommended to continue as  needed dosing.  Obtain EKG.  5.   Paroxysmal A. fib: Resume home medications including Eliquis.  Electrolytes okay except for mild hypocalcemia.  Will replace IV.  6.  Dysphagia: Speech therapy to follow along while here.  Will resume dysphagia 2 diet for now.  7. Aortic aneurysms: Last CTA in May 2020 had revealed Stable aneurysmal dilatation of the ascending aorta measuring up to 5.1 cm. Stable small 6 mm penetrating ulcer at the transverse arch.Status post infrarenal abdominal aortic grafting without high-grade stenosis or occlusive disease. Aneurysmal dilatation of left internal iliac artery and bilateral common femoral arteries.Mild aneurysmal dilatation of the suprarenal abdominal aorta up to 3.2 cm.  Patient also had CT abdomen/pelvis in August 2020 showing stable findings.  8.  BPH: Resume home medications  9.  GERD: Resume home medications  10. Chronic normocytic anemia: Fecal occult blood positive in the ED. patient does have history of diverticulosis and lower GI bleed.  Currently hemoglobin stable on anticoagulation.  11.  Mild dementia, high risk for delirium: Per last PCP note patient on maximal therapy for dementia and does have baseline periods of disorientation.  He does tend to get confused during hospitalizations per discussion with son and does better in familiar surroundings.  Will have  Zyprexa as needed available.  Avoid benzodiazepines. Video sitter if possible  12.  Goals of care: Per son, family had been considering palliative care and possibly advancing to hospice if patient continues to decline.  Given current acute illness, they would like nonaggressive treatment trial.  If he does not progress well, they may consider withdrawal of care and comfort measures.  DVT prophylaxis: On Eliquis  COVID screen: Now resulted positive  Code Status: DNR    .Health care proxy would be his sons  Patient/Family Communication: Discussed with son, Annett Gula called: Palliative  care Admission status :I certify that at the point of admission it is my clinical judgment that the patient will require inpatient hospital care spanning beyond 2 midnights from the point of admission due to high intensity of service and high frequency of surveillance required.Inpatient status is judged to be reasonable and necessary in order to provide the required intensity of service to ensure the patient's safety. The patient's presenting symptoms, physical exam findings, and initial radiographic and laboratory data in the context of their chronic comorbidities is felt to place them at high risk for further clinical deterioration. The following factors support the patient status of inpatient : COVID-19 pneumonia with acute hypoxic respiratory failure and sepsis syndrome requiring high flow O2 via nasal cannula and IV antibiotics/antivirals.  Possible aspiration component as well.     Guilford Shi MD [facility] Pager in [place]  If 7PM-7AM, please contact night-coverage [email or web address]   07/08/2019, 3:59 PM

## 2019-06-25 NOTE — ED Provider Notes (Addendum)
[name] EMERGENCY DEPARTMENT Provider Note   CSN: [id number] Arrival date & time: 06/22/2019  1138     History No chief complaint on file.   Brad [name] is a 86 y.o. male.  Patient is an 86 year old male with a significant past medical history of COPD, dementia, hypertension, CAD status post stents, paroxysmal atrial fibrillation on Eliquis who is presenting today via EMS for worsening confusion, generalized weakness for the last 2 days.  Son is currently living at home with the patient and has noticed a significant decline in the last 2 days.  Patient denies any pain, swelling or shortness of breath however EMS noted that patient was hypoxic and tachypneic upon their arrival.  Patient had recently been admitted at the end of January with acute respiratory failure and hypoxia as well as some mild fluid overload.  Patient has been home since discharge from the hospital but is only been within the last 2 days that there is been a significant decline.  Patient is currently a DNR.  The history is provided by the patient, the EMS personnel and medical records.       Past Medical History:  Diagnosis Date  . Acute on chronic respiratory failure ([place]) 06/06/2019  . CAD (coronary artery disease)    s/p stent to OM2 in past;  Last LHC 3/05: EF 60%, left RA occluded, right RA ok, prox to mid LAD 60%, oD1 75-80%, oD2 small 75%, pOM1 50%, OM2 stent ok with 50% before stent, mRCA 30%, dRCA 30%, PDA 70%.  Last dobutamine myoview 5/06: no scar or ischemia, EF 60%.  . Chronic lower back pain   . COPD (chronic obstructive pulmonary disease) ([place])   . Dementia ([place])   . DJD (degenerative joint disease)   . Enlarged prostate   . 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 GERD (gastroesophageal reflux disease)   . Hemorrhoids   . Hernia, abdominal    "he has ~ 4; wears binder" (11/20/2015)  . HLD (hyperlipidemia)   . HTN (hypertension)    Last echo 1/07:  Normal LVF, LV thickness upper limits of  normal, mild aortic root dilatation, mild to mod MAC, mild LAE, borderline RVH, mild RAE.  . Macular degeneration   . Pacemaker    DR Radford Pax; a. s/p MDT dual chamber PPM with His Bundle pacing  . PAD (peripheral artery disease) ([place])   . Paroxysmal atrial fibrillation ([facility])   . Psoriasis   . S/P AAA repair    Dr. Roxan Hockey  . Scoliosis   . Tobacco abuse   . Unilateral congenital absence of kidney     Patient Active Problem List   Diagnosis Date Noted  . Dysphagia 06/08/2019  . Acute right-sided CHF (congestive heart failure) ([place]) 06/08/2019  . Balanitis 06/05/2019  . DNR (do not resuscitate) 06/05/2019  . Pacemaker 02/11/2019  . Lower GI bleeding 12/13/2018  . Dementia due to Alzheimer's disease ([place]) 10/02/2018  . Blood in urine 12/04/2016  . Mild dementia ([place]) 07/03/2016  . Atrial fibrillation ([place]) 11/21/2015  . Sinus node dysfunction ([place]) 11/20/2015  . BPH (benign prostatic hyperplasia) 07/07/2015  . Hyperlipemia 07/07/2015  . Ventral hernia 05/31/2013  . Carpal tunnel syndrome, bilateral 05/07/2011  . 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 COPD (chronic obstructive pulmonary disease) ([place])   . ADENOMATOUS COLONIC POLYP 04/18/2008  . Essential hypertension 04/18/2008  . Coronary atherosclerosis 04/18/2008  . AAA 04/18/2008  . PVD 04/18/2008  . GERD 04/18/2008  . DIVERTICULOSIS, COLON 04/18/2008  Past Surgical History:  Procedure Laterality Date  . APPENDECTOMY    . CARPAL TUNNEL RELEASE Right 12/26/2014   Procedure: RIGHT ENDOSCOPIC CARPAL TUNNEL SYNDROME RELEASE;  Surgeon: Milly Jakob, MD;  Location: [facility];  Service: Orthopedics;  Laterality: Right;  . CATARACT EXTRACTION, BILATERAL Bilateral   . CORONARY ANGIOPLASTY WITH STENT PLACEMENT     s/p stent to OM2 in past;   . EP IMPLANTABLE DEVICE N/A 11/20/2015   Procedure: Pacemaker Implant;  Surgeon: Evans Lance, MD;  Location: [facility];  Service: Cardiovascular;  Laterality: N/A;  . HERNIA  REPAIR    . INSERT / REPLACE / REMOVE PACEMAKER  11/20/2015  . RESECTION AND GRAFTING OF INFARENAL  ABDOMINAL  AORTIC ANEURYSM  05/03/2002       Family History  Problem Relation Age of Onset  . Cancer Mother        uterine  . Stroke Mother        hemi plagic  . Cancer Sister   . COPD Sister   . Early death Brother   . Cancer Brother        Lip  . Stroke Father   . AAA (abdominal aortic aneurysm) Son   . Arthritis Son        knee  . COPD Brother   . Cancer Son        testicular   . Hypertension Son   . Early death Sister     Social History   Tobacco Use  . Smoking status: Former Smoker    Packs/day: 2.50    Years: 52.00    Pack years: 130.00    Types: Cigarettes    Quit date: 05/06/1996    Years since quitting: 23.1  . Smokeless tobacco: Never Used  Substance Use Topics  . Alcohol use: Yes    Comment: 11/20/2015 "used to drink heavily; quit 05/06/1996"  . Drug use: No    Home Medications Prior to Admission medications   Medication Sig Start Date End Date Taking? Authorizing Provider  acetaminophen (TYLENOL ARTHRITIS PAIN) 650 MG CR tablet Take 1,300 mg by mouth 2 (two) times a day.     [provider]  donepezil (ARICEPT) 10 MG tablet Take 1 tablet daily Patient taking differently: Take 10 mg by mouth daily.  02/12/19   Cameron Sprang, MD  ELIQUIS 5 MG TABS tablet TAKE 1 TABLET BY MOUTH TWICE A DAY Patient taking differently: Take 5 mg by mouth 2 (two) times daily.  04/05/19   [name], MD  fexofenadine (ALLEGRA) 180 MG tablet TAKE 1 TABLET (180 MG TOTAL) BY MOUTH DAILY. FOR ALLERGY SYMPTOMS 03/23/19   [name], MD  finasteride (PROSCAR) 5 MG tablet TAKE 1 TABLET BY MOUTH EVERY DAY Patient taking differently: Take 5 mg by mouth daily.  06/01/19   [name], MD  fluticasone (FLONASE) 50 MCG/ACT nasal spray SPRAY 2 SPRAYS INTO EACH NOSTRIL EVERY DAY Patient taking differently: Place 2 sprays into both nostrils daily as needed for allergies.   11/10/17   [name], MD  fluticasone furoate-vilanterol (BREO ELLIPTA) 100-25 MCG/INH AEPB Inhale 1 puff into the lungs daily. 06/16/19   [name], MD  food thickener (SIMPLYTHICK) POWD Take 1 packet by mouth as needed. 06/08/19 07/08/19  Guilford Shi, MD  furosemide (LASIX) 20 MG tablet Take 1 tablet (20 mg total) by mouth daily. 06/08/19 06/07/20  Guilford Shi, MD  Ipratropium-Albuterol (COMBIVENT RESPIMAT) 20-100 MCG/ACT AERS respimat Inhale 1 puff into  the lungs every 6 (six) hours as needed for wheezing or shortness of breath. 06/08/19 07/08/19  Guilford Shi, MD  isosorbide mononitrate (IMDUR) 30 MG 24 hr tablet TAKE 1 TABLET (30 MG TOTAL) BY MOUTH DAILY. (NEEDS TO BE SEEN BEFORE NEXT REFILL) 06/14/19   [name], MD  KLOR-CON M20 20 MEQ tablet TAKE 1 TABLET BY MOUTH EVERY DAY Patient taking differently: Take 20 mEq by mouth daily.  06/01/19   [name], MD  memantine (NAMENDA XR) 28 MG CP24 24 hr capsule Take 1 capsule (28 mg total) by mouth daily. Needs to be seen for further refills. Patient taking differently: Take 28 mg by mouth daily with supper. Needs to be seen for further refills. 04/06/19   [name], MD  metoprolol tartrate (LOPRESSOR) 25 MG tablet TAKE 1 TABLET BY MOUTH TWICE A DAY 06/14/19   [name], MD  ondansetron (ZOFRAN) 4 MG tablet Take 1 tablet (4 mg total) by mouth every 8 (eight) hours as needed for nausea or vomiting. 06/02/19   Loman Brooklyn, FNP  psyllium (METAMUCIL SMOOTH TEXTURE) 58.6 % powder Take 1 packet by mouth 3 (three) times daily. Patient taking differently: Take 1 packet by mouth daily as needed (constipation).  12/27/18   Law, Bea Graff, PA-C  rosuvastatin (CRESTOR) 5 MG tablet Take 1 tablet (5 mg total) by mouth daily with supper. 06/14/19   [name], MD  Spacer/Aero-Holding Chambers (AEROCHAMBER MV) inhaler Use as instructed 06/02/19   Loman Brooklyn, FNP  tamsulosin (FLOMAX) 0.4 MG CAPS capsule TAKE 2 CAPSULES  (0.8 MG TOTAL) BY MOUTH AT BEDTIME. 06/14/19   [name], MD    Allergies    Aspirin, Atorvastatin, and Cephalexin  Review of Systems   Review of Systems  All other systems reviewed and are negative.   Physical Exam Updated Vital Signs BP 113/78   Pulse 74   Temp (!) 96.4 F (35.8 C) (Oral)   Resp (!) 22   Ht _0  (1.676 m)   Wt 77.6 kg   SpO2 97%   BMI 27.61 kg/m   Physical Exam Vitals and nursing note reviewed.  Constitutional:      General: He is not in acute distress.    Appearance: He is well-developed and normal weight. He is ill-appearing.  HENT:     Head: Normocephalic and atraumatic.     Mouth/Throat:     Mouth: Mucous membranes are dry.  Eyes:     Pupils: Pupils are equal, round, and reactive to light.     Comments: Pale conjunctive a  Cardiovascular:     Rate and Rhythm: Normal rate and regular rhythm.     Pulses: Normal pulses.     Heart sounds: No murmur.  Pulmonary:     Effort: Tachypnea and accessory muscle usage present. No respiratory distress.     Breath sounds: Rhonchi and rales present. No wheezing.  Abdominal:     General: There is no distension.     Palpations: Abdomen is soft.     Tenderness: There is no abdominal tenderness. There is no guarding or rebound.  Genitourinary:    Rectum: Guaiac result positive.     Comments: Small ulcer near the meatus but no significant evidence of balanitis.  Stool is brown on exam but heme positive Musculoskeletal:        General: No tenderness. Normal range of motion.     Cervical back: Normal range of motion and neck supple.  Skin:  General: Skin is warm and dry.     Coloration: Skin is pale.     Findings: No erythema or rash.  Neurological:     Mental Status: He is alert.     Comments: Oriented to person and place. Able to move all ext and can follow commands  Psychiatric:        Behavior: Behavior normal.     Comments: Calm and cooperative     ED Results / Procedures / Treatments     Labs (all labs ordered are listed, but only abnormal results are displayed) Labs Reviewed  CBC WITH DIFFERENTIAL/PLATELET - Abnormal; Notable for the following components:      Result Value   RBC 3.46 (*)    Hemoglobin 10.6 (*)    HCT 34.0 (*)    RDW 16.5 (*)    Lymphs Abs 0.5 (*)    All other components within normal limits  COMPREHENSIVE METABOLIC PANEL - Abnormal; Notable for the following components:   Potassium 5.7 (*)    CO2 20 (*)    Glucose, Bld 123 (*)    BUN 53 (*)    Creatinine, Ser 1.39 (*)    Calcium 8.2 (*)    Total Protein 5.8 (*)    Albumin 2.3 (*)    AST 143 (*)    ALT 88 (*)    Alkaline Phosphatase 170 (*)    Total Bilirubin 1.7 (*)    GFR calc non Af Amer 45 (*)    GFR calc Af Amer 52 (*)    All other components within normal limits  BRAIN NATRIURETIC PEPTIDE - Abnormal; Notable for the following components:   B Natriuretic Peptide 341.9 (*)    All other components within normal limits  LACTIC ACID, PLASMA - Abnormal; Notable for the following components:   Lactic Acid, Venous 3.0 (*)    All other components within normal limits  APTT - Abnormal; Notable for the following components:   aPTT 42 (*)    All other components within normal limits  PROTIME-INR - Abnormal; Notable for the following components:   Prothrombin Time 27.4 (*)    INR 2.6 (*)    All other components within normal limits  POC OCCULT BLOOD, ED - Abnormal; Notable for the following components:   Fecal Occult Bld POSITIVE (*)    All other components within normal limits  POCT I-STAT EG7 - Abnormal; Notable for the following components:   pCO2, Ven 41.1 (*)    Acid-base deficit 5.0 (*)    Calcium, Ion 1.06 (*)    HCT 31.0 (*)    Hemoglobin 10.5 (*)    All other components within normal limits  URINE CULTURE  RESPIRATORY PANEL BY RT PCR (FLU A&B, COVID)  CULTURE, BLOOD (ROUTINE X 2)  CULTURE, BLOOD (ROUTINE X 2)  URINALYSIS, ROUTINE W REFLEX MICROSCOPIC  I-STAT VENOUS BLOOD  GAS, ED  TYPE AND SCREEN    EKG None  Radiology DG Chest Port 1 View  Result Date: 07/03/2019 CLINICAL DATA:  Hypoxia. Shortness of breath. EXAM: PORTABLE CHEST 1 VIEW COMPARISON:  June 08, 2019. FINDINGS: Stable cardiomegaly. Atherosclerosis of thoracic aorta is noted. Left-sided pacemaker is unchanged in position. Increased bilateral right upper lobe and left lower lobe opacities are noted concerning for worsening pneumonia. No pneumothorax or pleural effusion is noted. Bony thorax is unremarkable. IMPRESSION: Worsening bilateral right upper lobe and left lower lobe opacities are noted concerning for worsening pneumonia. Aortic Atherosclerosis (ICD10-I70.0). Electronically Signed  By: Marijo Conception M.D.   On: 07/04/2019 12:26    Procedures Procedures (including critical care time)  Medications Ordered in ED Medications - No data to display  ED Course  I have reviewed the triage vital signs and the nursing notes.  Pertinent labs & imaging results that were available during my care of the patient were reviewed by me and considered in my medical decision making (see chart for details).    MDM Rules/Calculators/A&P                      Elderly male with multiple medical problems presenting today with decline in functional status over the last 2 days.  Patient found to be hypoxic, tachypneic upon arrival here and even with 5 L nasal cannula only satting in the mid 80s.  Patient was placed on high flow nasal cannula with significant improvement with sats in the mid 90s.  Patient has no focal areas of pain.  There was report of balanitis and infection on the glans of the penis that was using nystatin cream.  No significance of balanitis at this time just a small ulcer on the side of the meatus.  Concern for sepsis versus Covid versus anemia versus CHF versus COPD versus hypercarbia.  Patient is mildly hypothermic at 96.4 rectally.  Respirations significantly improved with high flow nasal  cannula.  Patient does not have significant edema but trace in his lower extremities.  Patient's last echo on 06/06/2019 showed an EF of 55 to 60%.  Patient's chest x-ray with evidence concerning for pneumonia and with hypoxia, hypothermia and declining function in the last 2 days we will treat for sepsis.  Code sepsis was initiated.  Patient received fluid in route but blood pressure has remained stable.  He was given Levaquin for pneumonia as he has a Keflex allergy.  Patient also appears pale and Hemoccult was positive although stool was normal in color.  Labs pending to evaluate for new found anemia.  2:59 PM  Lactate elevated at 3 and patient given 1 L of fluid.  BNP stable at 340.  Patient has mild AKI today with elevated creatinine of 1.39 with mild elevated LFTs of 143 and 88.  After IV fluids, antibiotics and high flow nasal cannula at 10 patient is more awake and alert and asking his current management.  Will admit for further care.  Pt is covid positive.  LESLIE LANGILLE was evaluated in Emergency Department on 07/11/2019 for the symptoms described in the history of present illness. He was evaluated in the context of the global COVID-19 pandemic, which necessitated consideration that the patient might be at risk for infection with the SARS-CoV-2 virus that causes COVID-19. Institutional protocols and algorithms that pertain to the evaluation of patients at risk for COVID-19 are in a state of rapid change based on information released by regulatory bodies including the CDC and federal and state organizations. These policies and algorithms were followed during the patient's care in the ED.  CRITICAL CARE Performed by: Trista Ciocca Total critical care time: 30 minutes Critical care time was exclusive of separately billable procedures and treating other patients. Critical care was necessary to treat or prevent imminent or life-threatening deterioration. Critical care was time spent personally  by me on the following activities: development of treatment plan with patient and/or surrogate as well as nursing, discussions with consultants, evaluation of patient's response to treatment, examination of patient, obtaining history from patient or surrogate, ordering  and performing treatments and interventions, ordering and review of laboratory studies, ordering and review of radiographic studies, pulse oximetry and re-evaluation of patient's condition.  Final Clinical Impression(s) / ED Diagnoses Final diagnoses:  Community acquired pneumonia, unspecified laterality  Sepsis with acute hypoxic respiratory failure without septic shock, due to unspecified organism [facility])  Pneumonia due to COVID-19 virus    Rx / DC Orders ED Discharge Orders    None       Blanchie Dessert, MD 06/29/2019 1503    Blanchie Dessert, MD 07/01/2019 1529    Blanchie Dessert, MD 06/21/2019 1529

## 2019-06-25 NOTE — Progress Notes (Signed)
Pharmacy Antibiotic Note  Brad [name] is a 86 y.o. male admitted on 07/08/2019 with pneumonia.  Pharmacy has been consulted for levaquin dosing due cephalexin allergy. Pt is afebrile and WBC is WNL. SCr is elevated above patients baseline at 1.39. Lactic acid is also elevated.   Plan: Levaquin 750mg  IV Q48H F/u renal fxn, C&S, clinical status  Height: 5\' 6"  (167.6 cm) Weight: 171 lb 1.2 oz (77.6 kg) IBW/kg (Calculated) : 63.8  Temp (24hrs), Avg:96.4 F (35.8 C), Min:96.4 F (35.8 C), Max:96.4 F (35.8 C)  Recent Labs  Lab 07/05/2019 1240  WBC 5.5  CREATININE 1.39*  LATICACIDVEN 3.0*    Estimated Creatinine Clearance: 35.3 mL/min (A) (by C-G formula based on SCr of 1.39 mg/dL (H)).    Allergies  Allergen Reactions  . Aspirin Shortness Of Breath and Swelling  . Atorvastatin Other (See Comments)    Leg pain, feet pain  . Cephalexin Other (See Comments)    Unknown reaction    Antimicrobials this admission: Levaquin 2/5>>  Dose adjustments this admission: N/A  Microbiology results: Pending  Thank you for allowing pharmacy to be a part of this patient's care.  Brad [name], Rande Lawman 06/21/2019 12:41 PM

## 2019-06-25 NOTE — Progress Notes (Signed)
Pt now on 15L NRB and 15 LHFNC with sats 88-89% Message sent to hospitalist about this change.

## 2019-06-25 NOTE — ED Notes (Signed)
Updated pt's son, Jori Moll, on pt's status and plan of care

## 2019-06-25 NOTE — ED Triage Notes (Signed)
Pt from home via ems; called out by family for poss stroke/tia; hx tia, no hx stroke; pt on thinner; passed stroke screen with ems; per family, pt has decreased coordination, and trouble moving L leg, slurred speech; decline mentally and pysically in health last 2 days per family; "infection on penis"; finished meds; rapid, shallow rr, not normally on o2; capnography 25, rr 40; given 950 ml ns PTA; 110/80; EMS could not get o2 sat reading above 80; maintaining work of breathing on 3L; exertional sob increased; COPD hx; no known sick contacts; afib on monitor; per family pt going into hospice care  110/70 p 108 cbg 191 RR 36

## 2019-06-25 NOTE — ED Notes (Signed)
Report called to [facility], RN at [facility]

## 2019-06-25 NOTE — ED Notes (Signed)
Pt removed supplemental O2. Safety mittens placed on pt. SpO2 increased to 88% on 10L HFNC. RN increased to 15 HFNC. SpO2 90. RT consulted. RN to call if pt unable to maintain O2

## 2019-06-25 NOTE — ED Notes (Signed)
Pt being transferred to [facility] via [facility] at this time.

## 2019-06-25 NOTE — Telephone Encounter (Signed)
Will need to send to [facility] since pt is in [employer]

## 2019-06-25 NOTE — Telephone Encounter (Signed)
For review. PCP not working today.

## 2019-06-26 ENCOUNTER — Other Ambulatory Visit: Payer: Self-pay

## 2019-06-26 DIAGNOSIS — J690 Pneumonitis due to inhalation of food and vomit: Secondary | ICD-10-CM

## 2019-06-26 DIAGNOSIS — Z7189 Other specified counseling: Secondary | ICD-10-CM

## 2019-06-26 DIAGNOSIS — J9601 Acute respiratory failure with hypoxia: Secondary | ICD-10-CM

## 2019-06-26 DIAGNOSIS — I420 Dilated cardiomyopathy: Secondary | ICD-10-CM | POA: Diagnosis present

## 2019-06-26 DIAGNOSIS — J431 Panlobular emphysema: Secondary | ICD-10-CM

## 2019-06-26 DIAGNOSIS — Q600 Renal agenesis, unilateral: Secondary | ICD-10-CM

## 2019-06-26 DIAGNOSIS — J189 Pneumonia, unspecified organism: Secondary | ICD-10-CM

## 2019-06-26 DIAGNOSIS — Z515 Encounter for palliative care: Secondary | ICD-10-CM

## 2019-06-26 DIAGNOSIS — Z20822 Contact with and (suspected) exposure to covid-19: Secondary | ICD-10-CM

## 2019-06-26 DIAGNOSIS — R0602 Shortness of breath: Secondary | ICD-10-CM

## 2019-06-26 DIAGNOSIS — N400 Enlarged prostate without lower urinary tract symptoms: Secondary | ICD-10-CM

## 2019-06-26 LAB — CBC WITH DIFFERENTIAL/PLATELET
Abs Immature Granulocytes: 0.32 10*3/uL — ABNORMAL HIGH (ref 0.00–0.07)
Basophils Absolute: 0 10*3/uL (ref 0.0–0.1)
Basophils Relative: 0 %
Eosinophils Absolute: 0 10*3/uL (ref 0.0–0.5)
Eosinophils Relative: 0 %
HCT: 36.7 % — ABNORMAL LOW (ref 39.0–52.0)
Hemoglobin: 10.8 g/dL — ABNORMAL LOW (ref 13.0–17.0)
Immature Granulocytes: 4 %
Lymphocytes Relative: 6 %
Lymphs Abs: 0.4 10*3/uL — ABNORMAL LOW (ref 0.7–4.0)
MCH: 29.2 pg (ref 26.0–34.0)
MCHC: 29.4 g/dL — ABNORMAL LOW (ref 30.0–36.0)
MCV: 99.2 fL (ref 80.0–100.0)
Monocytes Absolute: 0.2 10*3/uL (ref 0.1–1.0)
Monocytes Relative: 3 %
Neutro Abs: 6.3 10*3/uL (ref 1.7–7.7)
Neutrophils Relative %: 87 %
Platelets: 200 10*3/uL (ref 150–400)
RBC: 3.7 MIL/uL — ABNORMAL LOW (ref 4.22–5.81)
RDW: 16.5 % — ABNORMAL HIGH (ref 11.5–15.5)
WBC: 7.3 10*3/uL (ref 4.0–10.5)
nRBC: 0.5 % — ABNORMAL HIGH (ref 0.0–0.2)

## 2019-06-26 LAB — URINE CULTURE

## 2019-06-26 LAB — IRON AND TIBC
Iron: 54 ug/dL (ref 45–182)
Saturation Ratios: 19 % (ref 17.9–39.5)
TIBC: 292 ug/dL (ref 250–450)
UIBC: 238 ug/dL

## 2019-06-26 LAB — COMPREHENSIVE METABOLIC PANEL
ALT: 82 U/L — ABNORMAL HIGH (ref 0–44)
AST: 80 U/L — ABNORMAL HIGH (ref 15–41)
Albumin: 2.6 g/dL — ABNORMAL LOW (ref 3.5–5.0)
Alkaline Phosphatase: 210 U/L — ABNORMAL HIGH (ref 38–126)
Anion gap: 11 (ref 5–15)
BUN: 50 mg/dL — ABNORMAL HIGH (ref 8–23)
CO2: 22 mmol/L (ref 22–32)
Calcium: 8.3 mg/dL — ABNORMAL LOW (ref 8.9–10.3)
Chloride: 108 mmol/L (ref 98–111)
Creatinine, Ser: 1.12 mg/dL (ref 0.61–1.24)
GFR calc Af Amer: >60 mL/min (ref >60)
GFR calc non Af Amer: 58 mL/min — ABNORMAL LOW (ref >60)
Glucose, Bld: 154 mg/dL — ABNORMAL HIGH (ref 70–99)
Potassium: 4.8 mmol/L (ref 3.5–5.1)
Sodium: 141 mmol/L (ref 135–145)
Total Bilirubin: 0.7 mg/dL (ref 0.3–1.2)
Total Protein: 6.4 g/dL — ABNORMAL LOW (ref 6.5–8.1)

## 2019-06-26 LAB — TYPE AND SCREEN
ABO/RH(D): A POS
Antibody Screen: NEGATIVE

## 2019-06-26 LAB — ABO/RH: ABO/RH(D): A POS

## 2019-06-26 LAB — PROCALCITONIN: Procalcitonin: 0.4 ng/mL

## 2019-06-26 LAB — VITAMIN B12: Vitamin B-12: 975 pg/mL — ABNORMAL HIGH (ref 180–914)

## 2019-06-26 LAB — D-DIMER, QUANTITATIVE: D-Dimer, Quant: 8.9 ug/mL-FEU — ABNORMAL HIGH (ref 0.00–0.50)

## 2019-06-26 LAB — C-REACTIVE PROTEIN: CRP: 22.7 mg/dL — ABNORMAL HIGH (ref <1.0)

## 2019-06-26 LAB — RETICULOCYTES
Immature Retic Fract: 38.4 % — ABNORMAL HIGH (ref 2.3–15.9)
RBC.: 3.91 MIL/uL — ABNORMAL LOW (ref 4.22–5.81)
Retic Count, Absolute: 21.5 10*3/uL (ref 19.0–186.0)
Retic Ct Pct: 0.6 % (ref 0.4–3.1)

## 2019-06-26 LAB — PHOSPHORUS: Phosphorus: 4.2 mg/dL (ref 2.5–4.6)

## 2019-06-26 LAB — LACTIC ACID, PLASMA
Lactic Acid, Venous: 2.5 mmol/L (ref 0.5–1.9)
Lactic Acid, Venous: 1.7 mmol/L (ref 0.5–1.9)
Lactic Acid, Venous: 2 mmol/L (ref 0.5–1.9)

## 2019-06-26 LAB — FERRITIN
Ferritin: 504 ng/mL — ABNORMAL HIGH (ref 24–336)
Ferritin: 427 ng/mL — ABNORMAL HIGH (ref 24–336)

## 2019-06-26 LAB — MAGNESIUM: Magnesium: 2.3 mg/dL (ref 1.7–2.4)

## 2019-06-26 LAB — FOLATE: Folate: 15.7 ng/mL (ref >5.9)

## 2019-06-26 LAB — MRSA PCR SCREENING: MRSA by PCR: NEGATIVE

## 2019-06-26 MED ORDER — SODIUM CHLORIDE 0.9 % IV SOLN
2.0000 g | Freq: Two times a day (BID) | INTRAVENOUS | Status: DC
  Administered 2019-06-26 – 2019-06-27 (×2): 2 g via INTRAVENOUS
  Filled 2019-06-26 (×2): qty 2

## 2019-06-26 MED ORDER — DEXTROSE-NACL 5-0.9 % IV SOLN: INTRAVENOUS | Status: DC

## 2019-06-26 MED ORDER — VANCOMYCIN HCL IN DEXTROSE 1-5 GM/200ML-% IV SOLN
1000.0000 mg | INTRAVENOUS | Status: DC
  Administered 2019-06-26: 1000 mg via INTRAVENOUS
  Filled 2019-06-26 (×2): qty 200

## 2019-06-26 NOTE — Progress Notes (Signed)
Spoke with the patient's son [name] for an update this morning.   Patient was NPO during breakfast, gave bath and changed linen.   Patient was not interested in lunch. Tried to offer fluids and food, patient took a few bites and started coughing.   Attempted to feed patient dinner, he ate 25% of his meal. He started coughing, regurgitated everything he ate.

## 2019-06-26 NOTE — Progress Notes (Addendum)
  Speech Language Pathology  Patient Details Name: Brad [name] MRN: [id number] DOB: August 28, 1927 Today's Date: 06/26/2019 Time:  -     Discussed pt with MD, Dr. Sherral Hammers. Pt was seen for MBSS during recent acute stay at [facility]. He was then seen at [facility] 06/23/19 for bedside swallow assessment with the following results, "pt was discharged on a D2 diet with NTL which is reportedly unsatisfactory to the pt understandably. Pt's son states that he would like for his father to be able to have what he asks for given that he is 86 years old. SLP reviewed the previous imaging and report of MBSS with the Pt and son, provided information regarding risks for aspiration." MD will consult Palliative care and recommended pt be place on diet recommended after MBS (SLP will order puree vs Dys 2 minced-ground)  If situation is appropriate, he diet could be again liberalized based on pt's wishes (?). ST to sign off at present.       Houston Siren 06/26/2019, 9:42 AM   Orbie Pyo Colvin Caroli.Ed Risk analyst [phone] Office [phone]

## 2019-06-26 NOTE — Progress Notes (Signed)
Patient's son Hamlett called, gave him an update.

## 2019-06-26 NOTE — Progress Notes (Signed)
Pharmacy Antibiotic Note  Brad [name] is a 86 y.o. male admitted on 06/23/2019 with pneumonia.  Pharmacy has been consulted for vanc/cefepime dosing.  Pt with multiple medical problems who was admitted here for [place]. He was previously admitted in January. Due to his desaturations and CXR. He is being started on vanc/cefepime empirically to cover for PNA. We will get a MRSA PCR to optimize abx.   PCT 0.69>0.4 WBC wnl Scr 1.12 CrCl~44  Plan: Vanc 1g IV q24>>AUC 439, scr 1.12 Cefepime 2g IV q12 MRSA PCR Levels if needed  Height: 5\' 6"  (167.6 cm) Weight: 171 lb 1.2 oz (77.6 kg) IBW/kg (Calculated) : 63.8  Temp (24hrs), Avg:97.5 F (36.4 C), Min:96.6 F (35.9 C), Max:97.9 F (36.6 C)  Recent Labs  Lab 07/07/2019 1240 07/10/2019 1900 06/26/19 0015  WBC 5.5  --  7.3  CREATININE 1.39*  --  1.12  LATICACIDVEN 3.0* 2.2* 2.5*    Estimated Creatinine Clearance: 43.8 mL/min (by C-G formula based on SCr of 1.12 mg/dL).    Allergies  Allergen Reactions  . Aspirin Shortness Of Breath and Swelling  . Atorvastatin Other (See Comments)    Leg pain, feet pain  . Cephalexin Other (See Comments)    Unknown reaction    Antimicrobials this admission: 2/6 vanc>> 2/6 cefepime>>  Dose adjustments this admission:   Microbiology results: 2/5 blood>> 2/5 urine>>poor sample 2/6 MRSA PCR>>  Onnie Boer, PharmD, [facility], AAHIVP, CPP Infectious Disease Pharmacist 06/26/2019 3:13 PM

## 2019-06-26 NOTE — Discharge Instructions (Signed)

## 2019-06-26 NOTE — Consult Note (Signed)
Consultation Note Date: 06/26/2019   Patient Name: Brad [name]  DOB: [birth year]  MRN: [id number]  Age / Sex: 86 y.o., male  PCP: [name], MD Referring Physician: Allie Bossier, MD  Reason for Consultation: Establishing goals of care  HPI/Patient Profile: 86 y.o. male  with past medical history of  Brad [name] is a 86 y.o. male with history h/o PAF on Eliquis; macular degeneration; HTN; HLD,GERD; CAD s/p stent, s/ppacemaker placement; s/p AAA repair and COPD who was hospitalized last month 1/16-1/19 for acute hypoxic respiratory failure and treated for COPD/CHF exacerbation and discharged home without any home O2 needs admitted on 07/13/2019 with cognitive/functional decline with worsening confusion, dyspnea, PNA COVID+.   Clinical Assessment and Goals of Care:  A palliative consult has been requested for goals of care discussions, particularly with regards to SLP evaluation and safest PO diet discussions, risk of aspiration PNA discussions.   Chart reviewed in detail.   Call placed, I was able to reach son Skylan Lara at [phone]. Introduced myself, palliative care, goals of care, nature of my call as follows: Palliative medicine is specialized medical care for people living with serious illness. It focuses on providing relief from the symptoms and stress of a serious illness. The goal is to improve quality of life for both the patient and the family.  Goals of care: Broad aims of medical therapy in relation to the patient's values and preferences. Our aim is to provide medical care aimed at enabling patients to achieve the goals that matter most to them, given the circumstances of their particular medical situation and their constraints.   Goals, wishes and values important to the patient attempted to be explored in my discussions with son [name] on the phone. We talked about the patient's  upcoming birthday tomorrow, he turns 42. Son [name] states the types of things the patient enjoys eating, eggs, sausage, mashed potatoes etc. And wants the patient to have these favorite food items, son [name] wants to prioritize the patient's wishes and comfort. We talked about aspiration PNA in detail. We also talked about current mode of care as well as next steps as far as disposition.   Unfortunately, [name] has tested positive for COVID and is in isolation with mild symptoms. He states that the patient has dementia, sun downing and has altered sleep-wake cycles. [name] asks about facility versus hospice, in order to keep the patient safe and comfortable.   Please note additional discussions below. Thank you for the consult.   NEXT OF KIN  2 sons [name] and Johnny. Was living alone, moved in with [name] after his last hospitalization in January 2021. [name] has also been diagnosed as COVID positive and is in isolation currently. Patient will not be able to go back home with [name]. See below.   SUMMARY OF RECOMMENDATIONS    call placed and discussed with son Rosser [place], patient recently moved in with [name] a few weeks ago:   We talked about patient's current scope of hospitalization,  goals of care in relation to SLP evaluation and preferred PO. Aspiration etiology, risks and causes discussed in detail. Son prefers for the patient to have comfort feeds, list of patient's preferred foods noted, patient's son understands and is accepting of aspiration risk.  Disposition options of either SNF or more likely residential hospice towards the end of this hospitalization also discussed.  PMT will follow, monitor PO intake, hospital course and symptom burden to help guide appropriate disposition planning.   Thank you for the consult.   Code Status/Advance Care Planning:  DNR    Symptom Management:    continue current mode of care.   Palliative Prophylaxis:   Delirium Protocol  Psycho-social/Spiritual:   Desire  for further Chaplaincy support:yes  Additional Recommendations: Education on Hospice  Prognosis:   Unable to determine  Discharge Planning: To Be Determined,?SNF versus hospice house next week after appropriate COVID treatments completed.      Primary Diagnoses: Present on Admission: . PVD . Pacemaker . Mild dementia ([place]) . GERD . Essential hypertension . Dementia due to Alzheimer's disease ([place]) . COPD (chronic obstructive pulmonary disease) ([place]) . BPH (benign prostatic hyperplasia) . Aneurysm of abdominal vessel ([place]) . Acute respiratory failure with hypoxia ([place]) . AKI (acute kidney injury) ([place]) . Dilated cardiomyopathy ([place])   I have reviewed the medical record, interviewed the patient and family, and examined the patient. The following aspects are pertinent.  Past Medical History:  Diagnosis Date  . Acute on chronic respiratory failure ([place]) 06/06/2019  . CAD (coronary artery disease)    s/p stent to OM2 in past;  Last LHC 3/05: EF 60%, left RA occluded, right RA ok, prox to mid LAD 60%, oD1 75-80%, oD2 small 75%, pOM1 50%, OM2 stent ok with 50% before stent, mRCA 30%, dRCA 30%, PDA 70%.  Last dobutamine myoview 5/06: no scar or ischemia, EF 60%.  . Chronic lower back pain   . COPD (chronic obstructive pulmonary disease) ([place])   . Dementia ([place])   . DJD (degenerative joint disease)   . Enlarged prostate   . Ethanolism ([place])   . GERD (gastroesophageal reflux disease)   . Hemorrhoids   . Hernia, abdominal    "he has ~ 4; wears binder" (11/20/2015)  . HLD (hyperlipidemia)   . HTN (hypertension)    Last echo 1/07:  Normal LVF, LV thickness upper limits of normal, mild aortic root dilatation, mild to mod MAC, mild LAE, borderline RVH, mild RAE.  . Macular degeneration   . Pacemaker    DR Radford Pax; a. s/p MDT dual chamber PPM with His Bundle pacing  . PAD (peripheral artery disease) ([place])   . Paroxysmal atrial fibrillation ([facility])   . Psoriasis   . S/P AAA repair     Dr. Roxan Hockey  . Scoliosis   . Tobacco abuse   . Unilateral congenital absence of kidney    Social History   Socioeconomic History  . Marital status: Widowed    Spouse name: Not on file  . Number of children: 3  . Years of education: Not on file  . Highest education level: Not on file  Occupational History  . Occupation: retired    Comment: [employer]   Tobacco Use  . Smoking status: Former Smoker    Packs/day: 2.50    Years: 52.00    Pack years: 130.00    Types: Cigarettes    Quit date: 05/06/1996    Years since quitting: 23.1  . Smokeless tobacco: Never Used  Substance and  Sexual Activity  . Alcohol use: Yes    Comment: 11/20/2015 "used to drink heavily; quit 05/06/1996"  . Drug use: No  . Sexual activity: Never  Other Topics Concern  . Not on file  Social History Narrative   Right handed      GED      Lives alone. Son lives next door   Social Determinants of Health   Financial Resource Strain:   . Difficulty of Paying Living Expenses: Not on file  Food Insecurity:   . Worried About Charity fundraiser in the Last Year: Not on file  . Ran Out of Food in the Last Year: Not on file  Transportation Needs:   . Lack of Transportation (Medical): Not on file  . Lack of Transportation (Non-Medical): Not on file  Physical Activity:   . Days of Exercise per Week: Not on file  . Minutes of Exercise per Session: Not on file  Stress:   . Feeling of Stress : Not on file  Social Connections:   . Frequency of Communication with Friends and Family: Not on file  . Frequency of Social Gatherings with Friends and Family: Not on file  . Attends Religious Services: Not on file  . Active Member of Clubs or Organizations: Not on file  . Attends Archivist Meetings: Not on file  . Marital Status: Not on file   Family History  Problem Relation Age of Onset  . Cancer Mother        uterine  . Stroke Mother        hemi plagic  . Cancer Sister   . COPD Sister   .  Early death Brother   . Cancer Brother        Lip  . Stroke Father   . AAA (abdominal aortic aneurysm) Son   . Arthritis Son        knee  . COPD Brother   . Cancer Son        testicular   . Hypertension Son   . Early death Sister    Scheduled Meds: . apixaban  5 mg Oral BID  . vitamin C  500 mg Oral Daily  . dexamethasone (DECADRON) injection  6 mg Intravenous Daily  . donepezil  10 mg Oral Daily  . finasteride  5 mg Oral Daily  . fluticasone furoate-vilanterol  1 puff Inhalation Daily  . Ipratropium-Albuterol  1 puff Inhalation Q6H  . isosorbide mononitrate  30 mg Oral Daily  . loratadine  10 mg Oral Daily  . memantine  28 mg Oral Q supper  . metoprolol tartrate  25 mg Oral BID  . potassium chloride SA  20 mEq Oral Daily  . rosuvastatin  5 mg Oral Q supper  . tamsulosin  0.8 mg Oral QHS  . zinc sulfate  220 mg Oral Daily   Continuous Infusions: . dextrose 5 % and 0.9% NaCl    . remdesivir 100 mg in NS 100 mL     PRN Meds:.acetaminophen, bisacodyl, chlorpheniramine-HYDROcodone, fluticasone, guaiFENesin-dextromethorphan, Ipratropium-Albuterol, OLANZapine zydis, ondansetron, polyethylene glycol, Resource ThickenUp Clear Medications Prior to Admission:  Prior to Admission medications   Medication Sig Start Date End Date Taking? Authorizing Provider  acetaminophen (TYLENOL ARTHRITIS PAIN) 650 MG CR tablet Take 1,300 mg by mouth 2 (two) times a day.    Yes [provider]  donepezil (ARICEPT) 10 MG tablet Take 1 tablet daily Patient taking differently: Take 10 mg by mouth daily.  02/12/19  Yes Cameron Sprang, MD  ELIQUIS 5 MG TABS tablet TAKE 1 TABLET BY MOUTH TWICE A DAY Patient taking differently: Take 5 mg by mouth 2 (two) times daily.  04/05/19  Yes Stacks, Cletus Gash, MD  fexofenadine (ALLEGRA) 180 MG tablet TAKE 1 TABLET (180 MG TOTAL) BY MOUTH DAILY. FOR ALLERGY SYMPTOMS 03/23/19  Yes Stacks, Cletus Gash, MD  finasteride (PROSCAR) 5 MG tablet TAKE 1 TABLET BY MOUTH EVERY  DAY Patient taking differently: Take 5 mg by mouth daily.  06/01/19  Yes Stacks, Cletus Gash, MD  fluticasone (FLONASE) 50 MCG/ACT nasal spray SPRAY 2 SPRAYS INTO EACH NOSTRIL EVERY DAY Patient taking differently: Place 2 sprays into both nostrils daily as needed for allergies.  11/10/17  Yes Stacks, Cletus Gash, MD  fluticasone furoate-vilanterol (BREO ELLIPTA) 100-25 MCG/INH AEPB Inhale 1 puff into the lungs daily. 06/16/19  Yes [name], MD  food thickener (SIMPLYTHICK) POWD Take 1 packet by mouth as needed. 06/08/19 07/08/19 Yes Guilford Shi, MD  furosemide (LASIX) 20 MG tablet Take 1 tablet (20 mg total) by mouth daily. 06/08/19 06/07/20 Yes Guilford Shi, MD  Ipratropium-Albuterol (COMBIVENT RESPIMAT) 20-100 MCG/ACT AERS respimat Inhale 1 puff into the lungs every 6 (six) hours as needed for wheezing or shortness of breath. 06/08/19 07/08/19 Yes Guilford Shi, MD  isosorbide mononitrate (IMDUR) 30 MG 24 hr tablet TAKE 1 TABLET (30 MG TOTAL) BY MOUTH DAILY. (NEEDS TO BE SEEN BEFORE NEXT REFILL) 06/14/19  Yes Stacks, Cletus Gash, MD  KLOR-CON M20 20 MEQ tablet TAKE 1 TABLET BY MOUTH EVERY DAY Patient taking differently: Take 20 mEq by mouth daily.  06/01/19  Yes [name], MD  memantine (NAMENDA XR) 28 MG CP24 24 hr capsule Take 1 capsule (28 mg total) by mouth daily. Needs to be seen for further refills. Patient taking differently: Take 28 mg by mouth daily with supper. Needs to be seen for further refills. 04/06/19  Yes Stacks, Cletus Gash, MD  metoprolol tartrate (LOPRESSOR) 25 MG tablet TAKE 1 TABLET BY MOUTH TWICE A DAY Patient taking differently: Take 25 mg by mouth 2 (two) times daily.  06/14/19  Yes [name], MD  ondansetron (ZOFRAN) 4 MG tablet Take 1 tablet (4 mg total) by mouth every 8 (eight) hours as needed for nausea or vomiting. 06/02/19  Yes Loman Brooklyn, FNP  psyllium (METAMUCIL SMOOTH TEXTURE) 58.6 % powder Take 1 packet by mouth 3 (three) times daily. Patient taking  differently: Take 1 packet by mouth daily as needed (constipation).  12/27/18  Yes Law, [place], PA-C  rosuvastatin (CRESTOR) 5 MG tablet Take 1 tablet (5 mg total) by mouth daily with supper. 06/14/19  Yes [name], MD  Spacer/Aero-Holding Chambers (AEROCHAMBER MV) inhaler Use as instructed 06/02/19  Yes Hendricks Limes F, FNP  tamsulosin (FLOMAX) 0.4 MG CAPS capsule TAKE 2 CAPSULES (0.8 MG TOTAL) BY MOUTH AT BEDTIME. 06/14/19  Yes [name], MD   Allergies  Allergen Reactions  . Aspirin Shortness Of Breath and Swelling  . Atorvastatin Other (See Comments)    Leg pain, feet pain  . Cephalexin Other (See Comments)    Unknown reaction   Review of Systems Noted to have baseline confusion as per chart review.  Physical Exam The above conversation was completed via telephone due to the visitor restrictions during the COVID-19 pandemic. Thorough chart review and discussion with necessary members of the care team was completed as part of assessment. All issues were discussed and addressed but no physical exam was performed.  Vital Signs: BP Marland Kitchen)  143/94 (BP Location: Right Wrist)   Pulse 85   Temp 97.8 F (36.6 C) (Axillary)   Resp 18   Ht _0  (1.676 m)   Wt 77.6 kg   SpO2 91%   BMI 27.61 kg/m  Pain Scale: 0-10   Pain Score: 0-No pain   SpO2: SpO2: 91 % O2 Device:SpO2: 91 % O2 Flow Rate: .O2 Flow Rate (L/min): 15 L/min(both O2 devices on 15)  IO: Intake/output summary:   Intake/Output Summary (Last 24 hours) at 06/26/2019 1200 Last data filed at 06/26/2019 0300 Gross per 24 hour  Intake 1350.92 ml  Output 200 ml  Net 1150.92 ml    LBM:   Baseline Weight: Weight: 77.6 kg Most recent weight: Weight: 77.6 kg     Palliative Assessment/Data:   PPS 30%  Time In:  11 Time Out:  12 Time Total:   60  Greater than 50%  of this time was spent counseling and coordinating care related to the above assessment and plan.  Signed by: Loistine Chance, MD   Please contact  Palliative Medicine Team phone at [phone] for questions and concerns.  For individual provider: See Shea Evans

## 2019-06-26 NOTE — Plan of Care (Signed)
Pt remains on 15 [place] and 15L NRB with sats of 88-90%. Pt is easily agitated. A&O to self only. BLU mitts.    Problem: Education: Goal: Knowledge of risk factors and measures for prevention of condition will improve Outcome: Not Progressing   Problem: Coping: Goal: Psychosocial and spiritual needs will be supported Outcome: Not Progressing   Problem: Respiratory: Goal: Will maintain a patent airway Outcome: Not Progressing Goal: Complications related to the disease process, condition or treatment will be avoided or minimized Outcome: Not Progressing

## 2019-06-26 NOTE — Progress Notes (Signed)
MD notified of critical values,

## 2019-06-26 NOTE — Progress Notes (Addendum)
PROGRESS NOTE    Brad [name]  [id number] DOB: May 19, 1928 DOA: 06/30/2019 PCP: [name], MD   Brief Narrative:  86 y.o. WM PMHx Dilated Cardiomyopathy, PAF on Eliquis; HTN, CAD s/p stent, s/ppacemaker placement; s/p AAA repair, macular degeneration; HLD,GERD;  and COPD   who was hospitalized last month 1/16-1/19 for acute hypoxic respiratory failure and treated for COPD/CHF exacerbation and discharged home without any home O2 needs, now presents in concern for cognitive/functional decline with worsening confusion.  Family states he is gotten weaker and somewhat dyspneic on walking.  This morning he was trying to walk with a walker and his legs gave away which prompted ED visit.  Per last discharge summary, son had reported noticing choking while patient having breakfast and dinner, speech therapy eval was obtained who recommended dysphagia diet upon discharge.  Hospital course was complicated by episodes of delirium/sundowning-responded well to conservative measures.  Patient has been living with his son, [name]. Patient tested negative for Covid in last admission but his other son, Chez, who had recently recovered from Covid was visiting him occasionally.  According to [name], patient followed up with PCP regarding dysphagia diet recommendations as he was not happy with using thickener in liquids.  He had speech therapy follow-up as outpatient who apparently advised regular diet consistency with small bites and small sips of thin liquids.  Patient however has been using meds and pured diet.     Subjective: Afebrile overnight.  Patient mild withdrawal to painful stimuli otherwise unresponsive   Assessment & Plan:   Principal Problem:   Acute respiratory failure with hypoxia (HCC) Active Problems:   Essential hypertension   Aneurysm of abdominal vessel (HCC)   PVD   GERD   COPD (chronic obstructive pulmonary disease) (HCC)   BPH (benign prostatic hyperplasia)   Mild dementia ([place])  Dementia due to Alzheimer's disease ([place])   Pacemaker   Chronic right-sided CHF (congestive heart failure) ([place])   Sepsis ([place])   Bilateral pneumonia   AKI (acute kidney injury) ([place])   Solitary kidney   Dilated cardiomyopathy ([place])  Covid pneumonia/acute respiratory failure with hypoxia -On admission does not meet criteria for sepsis COVID-19 Labs  Recent Labs    07/01/2019 1900 06/26/19 0015  DDIMER 5.07* 8.90*  FERRITIN  --  504*  CRP  --  22.7*    Lab Results  Component Value Date   SARSCOV2NAA POSITIVE (A) 06/23/2019   SARSCOV2NAA NEGATIVE 06/05/2019   Singac Not Detected 06/02/2019   SARSCOV2NAA NEGATIVE 12/13/2018   -Decadron 6 mg daily -Remdesivir per pharmacy protocol -Combivent -Vitamins per Covid protocol -Flutter valve -Incentive spirometry -Titrate to maintain SPO2> 88% -Prone 16 hours/day; if cannot tolerate prone 2 to 3 hours per shift  HCAP/Aspiration pneumonia -Trend procalcitonin Results for TAVAREZ, AFONSO (MRN [id number]) as of 06/26/2019 14:37  Ref. Range 06/24/2019 17:06 06/26/2019 08:55  Procalcitonin Latest Units: ng/mL 0.69 0.40  -Initiate antibiotics.  Will complete 5-day course unless family decides to make patient comfort care.  COPD -See Covid pneumonia  Lactic acidosis -Trend  lactic acid -D5-0.9% saline 162ml/hr  Dilated Cardiomyopathy -S/p pacemaker left chest wall -Strict in and out -Daily weight -Continue Apixaban 5 mg BID -Metoprolol 25 mg BID -Imdur 30 mg daily  Paroxysmal atrial fibrillation -Currently NSR -See cardiomyopathy  Essential HTN -See cardiomyopathy  HLD -Rosuvastatin 5 mg daily   AKI/Solitary kidney Recent Labs  Lab 07/04/2019 1240 06/26/19 0015  CREATININE 1.39* 1.12    Alzheimer's dementia -Aricept 10 mg daily -  Namenda 28 mg daily  Dysphagia -2/6 swallow evaluation pending  Malnutrition -Patient currently unresponsive not taking a.m. food or fluids. -D5-0.9% saline 19ml/hr -We will  await recommendations from palliative care  Anemia -Anemia panel pending -Occult blood pending  Goals of care -2/6 palliative care consult; discussed with family hospice, as they want to continue comfort feeds even though patient aspirating.  Need to understand SHOULD NOT return patient to hospital/doctors when the inevitable pneumonia develops.    DVT prophylaxis: Apixaban Code Status: DNR Family Communication: 2/6 Dr Loistine Chance palliative care and I spoke with [name] (Son) counseled on plan of care, answered all questions.   Disposition Plan: TBD   Consultants:  Palliative care   Procedures/Significant Events:  2/4 PCXR-:Increased bilateral RUL/LLL opacities are noted concerning for worsening pneumonia.    I have personally reviewed and interpreted all radiology studies and my findings are as above.  VENTILATOR SETTINGS: HFNC + NRB 2/6 Flow; 15 L/min SPO2 96%   Cultures     Antimicrobials: Anti-infectives (From admission, onward)   Start     Dose/Rate Stop   2019-07-15 1300  levofloxacin (LEVAQUIN) IVPB 750 mg  Status:  Discontinued     750 mg 100 mL/hr over 90 Minutes 06/30/2019 1616   06/26/19 1000  remdesivir 100 mg in sodium chloride 0.9 % 100 mL IVPB     100 mg 200 mL/hr over 30 Minutes 06/30/19 0959   07/07/2019 1800  ampicillin-sulbactam (UNASYN) 1.5 g in sodium chloride 0.9 % 100 mL IVPB  Status:  Discontinued     1.5 g 200 mL/hr over 30 Minutes 07/01/2019 1753   07/16/2019 1715  remdesivir 200 mg in sodium chloride 0.9% 250 mL IVPB     200 mg 580 mL/hr over 30 Minutes 06/23/2019 1959   07/16/2019 1245  levofloxacin (LEVAQUIN) IVPB 750 mg     750 mg 100 mL/hr over 90 Minutes 07/04/2019 1455      Devices    LINES / TUBES:      Continuous Infusions: . remdesivir 100 mg in NS 100 mL       Objective: Vitals:   07/09/2019 2030 07/02/2019 2146 07/15/2019 2329 06/26/19 0400  BP: (!) 140/92 (!) 152/94 104/88 (!) 143/100  Pulse: (!) 112 100 (!) 105 98  Resp:  (!) 21 (!) 23 20 16   Temp:  (!) 96.6 F (35.9 C) 97.6 F (36.4 C) 97.9 F (36.6 C)  TempSrc:  Axillary Axillary Axillary  SpO2: 90% 90% 93% 96%  Weight:      Height:        Intake/Output Summary (Last 24 hours) at 06/26/2019 0804 Last data filed at 06/26/2019 0300 Gross per 24 hour  Intake 1350.92 ml  Output 200 ml  Net 1150.92 ml   Filed Weights   07/18/2019 1209  Weight: 77.6 kg    Examination:  General: A/O x0 (mild withdrawal to painful stimuli), positive acute respiratory distress Eyes: negative scleral hemorrhage, negative anisocoria, negative icterus ENT: Negative Runny nose, negative gingival bleeding, Neck:  Negative scars, masses, torticollis, lymphadenopathy, JVD Lungs: Clear to auscultation bilaterally without wheezes or crackles Cardiovascular: Regular rate and rhythm without murmur gallop or rub normal S1 and S2, pacer left chest wall. Abdomen: negative abdominal pain, nondistended, positive soft, bowel sounds, no rebound, no ascites, no appreciable mass Extremities: No significant cyanosis, clubbing, or edema bilateral lower extremities Skin: Negative rashes, lesions, ulcers Psychiatric: Unable to evaluate Central nervous system: Mild withdraw to painful stimuli otherwise unresponsive .  Data Reviewed: Care during the described time interval was provided by me .  I have reviewed this patient's available data, including medical history, events of note, physical examination, and all test results as part of my evaluation.   CBC: Recent Labs  Lab 06/24/2019 1240 07/04/2019 1246 06/26/19 0015  WBC 5.5  --  7.3  NEUTROABS 4.7  --  6.3  HGB 10.6* 10.5* 10.8*  HCT 34.0* 31.0* 36.7*  MCV 98.3  --  99.2  PLT 181  --  [id number]   Basic Metabolic Panel: Recent Labs  Lab 06/22/2019 1240 07/12/2019 1246 06/26/19 0015  NA 143 143 141  K 5.7* 4.5 4.8  CL 109  --  108  CO2 20*  --  22  GLUCOSE 123*  --  154*  BUN 53*  --  50*  CREATININE 1.39*  --  1.12  CALCIUM 8.2*   --  8.3*  MG  --   --  2.3   GFR: Estimated Creatinine Clearance: 43.8 mL/min (by C-G formula based on SCr of 1.12 mg/dL). Liver Function Tests: Recent Labs  Lab 07/02/2019 1240 06/26/19 0015  AST 143* 80*  ALT 88* 82*  ALKPHOS 170* 210*  BILITOT 1.7* 0.7  PROT 5.8* 6.4*  ALBUMIN 2.3* 2.6*   No results for input(s): LIPASE, AMYLASE in the last 168 hours. No results for input(s): AMMONIA in the last 168 hours. Coagulation Profile: Recent Labs  Lab 07/14/2019 1345  INR 2.6*   Cardiac Enzymes: No results for input(s): CKTOTAL, CKMB, CKMBINDEX, TROPONINI in the last 168 hours. BNP (last 3 results) No results for input(s): PROBNP in the last 8760 hours. HbA1C: No results for input(s): HGBA1C in the last 72 hours. CBG: No results for input(s): GLUCAP in the last 168 hours. Lipid Profile: No results for input(s): CHOL, HDL, LDLCALC, TRIG, CHOLHDL, LDLDIRECT in the last 72 hours. Thyroid Function Tests: No results for input(s): TSH, T4TOTAL, FREET4, T3FREE, THYROIDAB in the last 72 hours. Anemia Panel: Recent Labs    06/26/19 0015  FERRITIN 504*   Urine analysis:    Component Value Date/Time   COLORURINE AMBER (A) 06/26/2019 1513   APPEARANCEUR CLOUDY (A) 07/03/2019 1513   APPEARANCEUR Clear 04/29/2019 1401   LABSPEC 1.027 06/22/2019 1513   PHURINE 5.0 07/07/2019 1513   GLUCOSEU NEGATIVE 07/15/2019 1513   HGBUR NEGATIVE 06/21/2019 1513   BILIRUBINUR NEGATIVE 06/22/2019 1513   BILIRUBINUR Negative 04/29/2019 1401   KETONESUR NEGATIVE 06/22/2019 1513   PROTEINUR 100 (A) 06/23/2019 1513   UROBILINOGEN negative 07/07/2015 1054   NITRITE NEGATIVE 06/24/2019 1513   LEUKOCYTESUR MODERATE (A) 06/24/2019 1513   Sepsis Labs: @LABRCNTIP (procalcitonin:4,lacticidven:4)  ) Recent Results (from the past 240 hour(s))  Blood Culture (routine x 2)     Status: None (Preliminary result)   Collection Time: 07/17/2019 12:15 PM   Specimen: BLOOD LEFT FOREARM  Result Value Ref Range  Status   Specimen Description BLOOD LEFT FOREARM  Final   Special Requests   Final    BOTTLES DRAWN AEROBIC AND ANAEROBIC Blood Culture adequate volume   Culture   Final    NO GROWTH < 24 HOURS Performed at [facility], [place] [address] [place], [place] [address]    Report Status PENDING  Incomplete  Blood Culture (routine x 2)     Status: None (Preliminary result)   Collection Time: 06/28/2019 12:35 PM   Specimen: BLOOD RIGHT FOREARM  Result Value Ref Range Status   Specimen Description BLOOD RIGHT FOREARM  Final   Special Requests   Final    BOTTLES DRAWN AEROBIC AND ANAEROBIC Blood Culture results may not be optimal due to an inadequate volume of blood received in culture bottles   Culture   Final    NO GROWTH < 24 HOURS Performed at [facility] [address] [place], [place] [address]    Report Status PENDING  Incomplete  Respiratory Panel by RT PCR (Flu A&B, Covid) - Nasopharyngeal Swab     Status: Abnormal   Collection Time: 07/01/2019 12:40 PM   Specimen: Nasopharyngeal Swab  Result Value Ref Range Status   SARS Coronavirus 2 by RT PCR POSITIVE (A) NEGATIVE Final    Comment: RESULT CALLED TO, READ BACK BY AND VERIFIED WITH: Angelina Ok RN 15:20 07/08/2019 ([email or web address]) (NOTE) SARS-CoV-2 target nucleic acids are DETECTED. SARS-CoV-2 RNA is generally detectable in upper respiratory specimens  during the acute phase of infection. Positive results are indicative of the presence of the identified virus, but do not rule out bacterial infection or co-infection with other pathogens not detected by the test. Clinical correlation with patient history and other diagnostic information is necessary to determine patient infection status. The expected result is Negative. Fact Sheet for Patients:  PinkCheek.be Fact Sheet for Healthcare Providers: GravelBags.it This test is not yet approved or cleared by the [place] FDA and  has been authorized for detection and/or diagnosis of SARS-CoV-2 by FDA under an Emergency Use Authorization (EUA).  This EUA will remain in effect (meaning this test can be  used) for the duration of  the COVID-19 declaration under Section 564(b)(1) of the Act, 21 U.S.C. section 360bbb-3(b)(1), unless the authorization is terminated or revoked sooner.    Influenza A by PCR NEGATIVE NEGATIVE Final   Influenza B by PCR NEGATIVE NEGATIVE Final    Comment: (NOTE) The Xpert Xpress SARS-CoV-2/FLU/RSV assay is intended as an aid in  the diagnosis of influenza from Nasopharyngeal swab specimens and  should not be used as a sole basis for treatment. Nasal washings and  aspirates are unacceptable for Xpert Xpress SARS-CoV-2/FLU/RSV  testing. Fact Sheet for Patients: PinkCheek.be Fact Sheet for Healthcare Providers: GravelBags.it This test is not yet approved or cleared by the [place] FDA and  has been authorized for detection and/or diagnosis of SARS-CoV-2 by  FDA under an Emergency Use Authorization (EUA). This EUA will remain  in effect (meaning this test can be used) for the duration of the  Covid-19 declaration under Section 564(b)(1) of the Act, 21  U.S.C. section 360bbb-3(b)(1), unless the authorization is  terminated or revoked. Performed at [facility], [place] [address], [place], [place] [address]          Radiology Studies: DG Chest Port 1 View  Result Date: 07/07/2019 CLINICAL DATA:  Hypoxia. Shortness of breath. EXAM: PORTABLE CHEST 1 VIEW COMPARISON:  June 08, 2019. FINDINGS: Stable cardiomegaly. Atherosclerosis of thoracic aorta is noted. Left-sided pacemaker is unchanged in position. Increased bilateral right upper lobe and left lower lobe opacities are noted concerning for worsening pneumonia. No pneumothorax or pleural effusion is noted. Bony thorax is unremarkable. IMPRESSION:  Worsening bilateral right upper lobe and left lower lobe opacities are noted concerning for worsening pneumonia. Aortic Atherosclerosis (ICD10-I70.0). Electronically Signed   By: Marijo Conception M.D.   On: 06/21/2019 12:26        Scheduled Meds: . apixaban  5 mg Oral BID  . vitamin C  500 mg Oral Daily  .  dexamethasone (DECADRON) injection  6 mg Intravenous Daily  . donepezil  10 mg Oral Daily  . finasteride  5 mg Oral Daily  . fluticasone furoate-vilanterol  1 puff Inhalation Daily  . Ipratropium-Albuterol  1 puff Inhalation Q6H  . isosorbide mononitrate  30 mg Oral Daily  . loratadine  10 mg Oral Daily  . memantine  28 mg Oral Q supper  . metoprolol tartrate  25 mg Oral BID  . potassium chloride SA  20 mEq Oral Daily  . rosuvastatin  5 mg Oral Q supper  . tamsulosin  0.8 mg Oral QHS  . zinc sulfate  220 mg Oral Daily   Continuous Infusions: . remdesivir 100 mg in NS 100 mL       LOS: 1 day   The patient is critically ill with multiple organ systems failure and requires high complexity decision making for assessment and support, frequent evaluation and titration of therapies, application of advanced monitoring technologies and extensive interpretation of multiple databases. Critical Care Time devoted to patient care services described in this note  Time spent: 40 minutes     Ashwika Freels, Geraldo Docker, MD [facility] Pager [phone]  If 7PM-7AM, please contact night-coverage [email or web address] Password [email or web address] 06/26/2019, 8:04 AM

## 2019-06-27 ENCOUNTER — Other Ambulatory Visit: Payer: Self-pay | Admitting: Family Medicine

## 2019-06-27 ENCOUNTER — Inpatient Hospital Stay (HOSPITAL_COMMUNITY): Payer: Medicare Other

## 2019-06-27 DIAGNOSIS — Z66 Do not resuscitate: Secondary | ICD-10-CM

## 2019-06-27 DIAGNOSIS — R451 Restlessness and agitation: Secondary | ICD-10-CM

## 2019-06-27 DIAGNOSIS — I480 Paroxysmal atrial fibrillation: Secondary | ICD-10-CM

## 2019-06-27 DIAGNOSIS — F0280 Dementia in other diseases classified elsewhere without behavioral disturbance: Secondary | ICD-10-CM

## 2019-06-27 DIAGNOSIS — I50812 Chronic right heart failure: Secondary | ICD-10-CM

## 2019-06-27 DIAGNOSIS — J431 Panlobular emphysema: Secondary | ICD-10-CM

## 2019-06-27 DIAGNOSIS — I739 Peripheral vascular disease, unspecified: Secondary | ICD-10-CM

## 2019-06-27 LAB — CBC WITH DIFFERENTIAL/PLATELET
Abs Immature Granulocytes: 0.32 10*3/uL — ABNORMAL HIGH (ref 0.00–0.07)
Basophils Absolute: 0.1 10*3/uL (ref 0.0–0.1)
Basophils Relative: 1 %
Eosinophils Absolute: 0 10*3/uL (ref 0.0–0.5)
Eosinophils Relative: 0 %
HCT: 37.3 % — ABNORMAL LOW (ref 39.0–52.0)
Hemoglobin: 11.1 g/dL — ABNORMAL LOW (ref 13.0–17.0)
Immature Granulocytes: 5 %
Lymphocytes Relative: 6 %
Lymphs Abs: 0.4 10*3/uL — ABNORMAL LOW (ref 0.7–4.0)
MCH: 29.3 pg (ref 26.0–34.0)
MCHC: 29.8 g/dL — ABNORMAL LOW (ref 30.0–36.0)
MCV: 98.4 fL (ref 80.0–100.0)
Monocytes Absolute: 0.3 10*3/uL (ref 0.1–1.0)
Monocytes Relative: 5 %
Neutro Abs: 5.3 10*3/uL (ref 1.7–7.7)
Neutrophils Relative %: 83 %
Platelets: 230 10*3/uL (ref 150–400)
RBC: 3.79 MIL/uL — ABNORMAL LOW (ref 4.22–5.81)
RDW: 16.6 % — ABNORMAL HIGH (ref 11.5–15.5)
WBC: 6.3 10*3/uL (ref 4.0–10.5)
nRBC: 1.1 % — ABNORMAL HIGH (ref 0.0–0.2)

## 2019-06-27 LAB — COMPREHENSIVE METABOLIC PANEL
ALT: 60 U/L — ABNORMAL HIGH (ref 0–44)
AST: 34 U/L (ref 15–41)
Albumin: 2.5 g/dL — ABNORMAL LOW (ref 3.5–5.0)
Alkaline Phosphatase: 186 U/L — ABNORMAL HIGH (ref 38–126)
Anion gap: 9 (ref 5–15)
BUN: 42 mg/dL — ABNORMAL HIGH (ref 8–23)
CO2: 26 mmol/L (ref 22–32)
Calcium: 8.5 mg/dL — ABNORMAL LOW (ref 8.9–10.3)
Chloride: 111 mmol/L (ref 98–111)
Creatinine, Ser: 0.99 mg/dL (ref 0.61–1.24)
GFR calc Af Amer: >60 mL/min (ref >60)
GFR calc non Af Amer: >60 mL/min (ref >60)
Glucose, Bld: 268 mg/dL — ABNORMAL HIGH (ref 70–99)
Potassium: 4.6 mmol/L (ref 3.5–5.1)
Sodium: 146 mmol/L — ABNORMAL HIGH (ref 135–145)
Total Bilirubin: 0.9 mg/dL (ref 0.3–1.2)
Total Protein: 6 g/dL — ABNORMAL LOW (ref 6.5–8.1)

## 2019-06-27 LAB — HEMOGLOBIN A1C
Hgb A1c MFr Bld: 6.7 % — ABNORMAL HIGH (ref 4.8–5.6)
Mean Plasma Glucose: 145.59 mg/dL

## 2019-06-27 LAB — LACTIC ACID, PLASMA
Lactic Acid, Venous: 2.4 mmol/L (ref 0.5–1.9)
Lactic Acid, Venous: 2 mmol/L (ref 0.5–1.9)

## 2019-06-27 LAB — GLUCOSE, CAPILLARY
Glucose-Capillary: 272 mg/dL — ABNORMAL HIGH (ref 70–99)
Glucose-Capillary: 237 mg/dL — ABNORMAL HIGH (ref 70–99)

## 2019-06-27 LAB — PHOSPHORUS: Phosphorus: 3 mg/dL (ref 2.5–4.6)

## 2019-06-27 LAB — C-REACTIVE PROTEIN: CRP: 15 mg/dL — ABNORMAL HIGH (ref <1.0)

## 2019-06-27 LAB — MAGNESIUM: Magnesium: 2.5 mg/dL — ABNORMAL HIGH (ref 1.7–2.4)

## 2019-06-27 LAB — OCCULT BLOOD X 1 CARD TO LAB, STOOL: Fecal Occult Bld: POSITIVE — AB

## 2019-06-27 LAB — FERRITIN: Ferritin: 300 ng/mL (ref 24–336)

## 2019-06-27 LAB — PROCALCITONIN: Procalcitonin: 0.25 ng/mL

## 2019-06-27 LAB — D-DIMER, QUANTITATIVE: D-Dimer, Quant: >20 ug/mL-FEU — ABNORMAL HIGH (ref 0.00–0.50)

## 2019-06-27 MED ORDER — GLYCOPYRROLATE 0.2 MG/ML IJ SOLN: 0.2000 mg | INTRAMUSCULAR | Status: DC | PRN

## 2019-06-27 MED ORDER — ACETAMINOPHEN 325 MG PO TABS: 650.0000 mg | ORAL_TABLET | Freq: Four times a day (QID) | ORAL | Status: DC | PRN

## 2019-06-27 MED ORDER — LORAZEPAM 2 MG/ML IJ SOLN
0.5000 mg | INTRAMUSCULAR | Status: DC
  Administered 2019-06-27: 0.5 mg via INTRAVENOUS
  Filled 2019-06-27: qty 1

## 2019-06-27 MED ORDER — INSULIN ASPART 100 UNIT/ML ~~LOC~~ SOLN
0.0000 [IU] | SUBCUTANEOUS | Status: DC
  Administered 2019-06-27: 5 [IU] via SUBCUTANEOUS
  Administered 2019-06-27: 3 [IU] via SUBCUTANEOUS

## 2019-06-27 MED ORDER — HALOPERIDOL LACTATE 5 MG/ML IJ SOLN: 0.5000 mg | INTRAMUSCULAR | Status: DC | PRN

## 2019-06-27 MED ORDER — ONDANSETRON HCL 4 MG/2ML IJ SOLN: 4.0000 mg | Freq: Four times a day (QID) | INTRAMUSCULAR | Status: DC | PRN

## 2019-06-27 MED ORDER — OLANZAPINE 10 MG PO TBDP
10.0000 mg | ORAL_TABLET | Freq: Two times a day (BID) | ORAL | Status: DC | PRN
  Filled 2019-06-27: qty 1

## 2019-06-27 MED ORDER — GLYCOPYRROLATE 1 MG PO TABS
1.0000 mg | ORAL_TABLET | ORAL | Status: DC | PRN
  Filled 2019-06-27: qty 1

## 2019-06-27 MED ORDER — ONDANSETRON 4 MG PO TBDP: 4.0000 mg | ORAL_TABLET | Freq: Four times a day (QID) | ORAL | Status: DC | PRN

## 2019-06-27 MED ORDER — HALOPERIDOL 0.5 MG PO TABS
0.5000 mg | ORAL_TABLET | ORAL | Status: DC | PRN
  Filled 2019-06-27: qty 1

## 2019-06-27 MED ORDER — ACETAMINOPHEN 650 MG RE SUPP: 325.0000 mg | RECTAL | Status: DC | PRN

## 2019-06-27 MED ORDER — HALOPERIDOL LACTATE 2 MG/ML PO CONC
0.5000 mg | ORAL | Status: DC | PRN
  Filled 2019-06-27: qty 0.3

## 2019-06-27 MED ORDER — MORPHINE 100MG IN NS 100ML (1MG/ML) PREMIX INFUSION
1.0000 mg/h | INTRAVENOUS | Status: DC
  Administered 2019-06-27: 1 mg/h via INTRAVENOUS
  Filled 2019-06-27: qty 100

## 2019-06-27 MED ORDER — HYDRALAZINE HCL 20 MG/ML IJ SOLN
5.0000 mg | Freq: Three times a day (TID) | INTRAMUSCULAR | Status: DC
  Administered 2019-06-27: 5 mg via INTRAVENOUS
  Filled 2019-06-27: qty 1

## 2019-06-27 MED ORDER — SODIUM CHLORIDE 0.9 % IV SOLN
12.5000 mg | Freq: Four times a day (QID) | INTRAVENOUS | Status: DC | PRN
  Filled 2019-06-27: qty 0.5

## 2019-06-27 MED ORDER — POLYVINYL ALCOHOL 1.4 % OP SOLN
1.0000 [drp] | Freq: Four times a day (QID) | OPHTHALMIC | Status: DC | PRN
  Filled 2019-06-27: qty 15

## 2019-06-27 MED ORDER — BIOTENE DRY MOUTH MT LIQD: 15.0000 mL | OROMUCOSAL | Status: DC | PRN

## 2019-06-27 MED ORDER — ACETAMINOPHEN 650 MG RE SUPP: 650.0000 mg | Freq: Four times a day (QID) | RECTAL | Status: DC | PRN

## 2019-06-27 MED ORDER — HYDRALAZINE HCL 20 MG/ML IJ SOLN: 5.0000 mg | INTRAMUSCULAR | Status: DC | PRN

## 2019-06-27 MED ORDER — METOPROLOL TARTRATE 5 MG/5ML IV SOLN
5.0000 mg | Freq: Three times a day (TID) | INTRAVENOUS | Status: DC
  Administered 2019-06-27: 5 mg via INTRAVENOUS
  Filled 2019-06-27: qty 5

## 2019-06-27 MED ORDER — LABETALOL HCL 5 MG/ML IV SOLN
5.0000 mg | INTRAVENOUS | Status: DC | PRN
  Administered 2019-06-27: 5 mg via INTRAVENOUS
  Filled 2019-06-27: qty 4

## 2019-06-27 MED ORDER — LORAZEPAM 2 MG/ML IJ SOLN
0.5000 mg | Freq: Four times a day (QID) | INTRAMUSCULAR | Status: DC | PRN
  Administered 2019-06-27: 1 mg via INTRAVENOUS
  Filled 2019-06-27: qty 1

## 2019-06-28 ENCOUNTER — Ambulatory Visit: Payer: Self-pay | Admitting: *Deleted

## 2019-06-28 ENCOUNTER — Other Ambulatory Visit: Payer: Self-pay | Admitting: *Deleted

## 2019-06-28 DIAGNOSIS — J1282 Pneumonia due to coronavirus disease 2019: Secondary | ICD-10-CM

## 2019-06-28 NOTE — Patient Outreach (Signed)
[facility]) Care Management  06/28/2019  Brad [name] 07-14-1927 [id number]   Noted that member was admitted to hospital last week, unfortunately he passed away.  Will close case at this time.  [name], [place], MSN [place] [phone]

## 2019-06-30 ENCOUNTER — Ambulatory Visit: Payer: Medicare Other

## 2019-06-30 LAB — CULTURE, BLOOD (ROUTINE X 2)
Culture: NO GROWTH
Culture: NO GROWTH
Special Requests: ADEQUATE

## 2019-07-01 ENCOUNTER — Other Ambulatory Visit: Payer: Self-pay | Admitting: Family Medicine

## 2019-07-02 ENCOUNTER — Ambulatory Visit: Payer: Medicare Other

## 2019-07-06 ENCOUNTER — Telehealth: Payer: Self-pay | Admitting: Internal Medicine

## 2019-07-06 NOTE — Telephone Encounter (Signed)
Patient's son calling to find out what to do with the patient's pacemaker equipment now that he is deceased. Please advise.

## 2019-07-06 NOTE — Telephone Encounter (Signed)
Spoke with patient's son, [name]. Offered condolences. Confirmed mailing address with [name], ordered prepaid return kit for patient's monitor via [employer]. Should arrive in 14 business days.  [name] verbalized appreciation for the care provided by Dr. Harrington Challenger and Dr. Lovena Le over the years.

## 2019-07-07 NOTE — Telephone Encounter (Signed)
Contacted son.

## 2019-07-19 NOTE — Telephone Encounter (Signed)
Let Ronny know I agree. I just saw him recently and will submit the referral to [facility] ([place] care organization.)

## 2019-07-19 NOTE — Progress Notes (Signed)
Spoke with the patient's son Brad [name], offered to set up family visit. Brad [name] is under quarantine at this time and is not comfortable visiting but he will reach out to his siblings to see if they wish to visit.

## 2019-07-19 NOTE — Progress Notes (Signed)
Patient passed, no signs of distress and appeared comfortable. Time of death 18:02, verified by 2 RN's. No heart sounds auscultated. MD made aware. Family notified, they wish to pick up patient belongings on 2/8.  Belongings left at clean nurses station include: clothing, 2 rings, 1 metal medical bracelet, glasses, and dentures.   100 mLs of Morphine wasted with Loel Lofty, ,RN.

## 2019-07-19 NOTE — Progress Notes (Signed)
Patient has removed NRB and HFNC multiple times. MD paged, received verbal orders for wrist restraints

## 2019-07-19 NOTE — Death Summary Note (Signed)
Death Summary  Brad [name] [id number] DOB: 10-08-1927 DOA: 07-11-2019  PCP: [name], MD PCP/Office notified: no  Admit date: 11-Jul-2019 Date of Death: 07/14/19  Final Diagnoses:  Principal Problem:   Acute respiratory failure with hypoxia ([place]) Active Problems:   Essential hypertension   Aneurysm of abdominal vessel (HCC)   PVD   GERD   COPD (chronic obstructive pulmonary disease) ([place])   BPH (benign prostatic hyperplasia)   Mild dementia ([place])   Dementia due to Alzheimer's disease ([place])   Pacemaker   Chronic right-sided CHF (congestive heart failure) ([place])   Sepsis ([place])   Bilateral pneumonia   AKI (acute kidney injury) ([place])   Solitary kidney   Dilated cardiomyopathy ([place])  Covid pneumonia/acute respiratory failure with hypoxia -On admission does not meet criteria for sepsis COVID-19 Labs  Recent Labs    11-Jul-2019 1900 06/26/19 0015 06/26/19 0855 Jul 13, 2019 0331  DDIMER 5.07* 8.90*  --  >20.00*  FERRITIN  --  504* 427* 300  CRP  --  22.7*  --  15.0*    Lab Results  Component Value Date   SARSCOV2NAA POSITIVE (A) Jul 11, 2019   Brad [name] NEGATIVE 06/05/2019   Brad [name] Not Detected 06/02/2019   SARSCOV2NAA NEGATIVE 12/13/2018    -Decadron 6 mg daily -Remdesivir per pharmacy protocol -Combivent -Vitamins per Covid protocol -Flutter valve -Incentive spirometry -Titrate to maintain SPO2> 88% -Prone 16 hours/day; if cannot tolerate prone 2 to 3 hours per shift -Discussed case with Brad [name] palliative care, family has decided to make patient comfort care  HCAP/Aspiration pneumonia -Trend procalcitonin Results for Brad [name], Brad [name] (MRN [id number]) as of July 14, 2019 14:12  Ref. Range July 11, 2019 17:06 06/26/2019 08:55 07/13/19 03:31  Procalcitonin Latest Units: ng/mL 0.69 0.40 0.25  -Initiate antibiotics.  Will complete 5-day course.  Discontinue when family made patient comfort care. -comfort care   COPD -See Covid pneumonia  Lactic  acidosis -Trend  lactic acid -2/7 decrease D5-0.9% saline KVO  Dilated Cardiomyopathy -S/p pacemaker left chest wall -Strict in and out +1.2 L -Daily weight    Filed Weights   2019-07-11 1209  Weight: 77.6 kg  -Continue Apixaban 5 mg BID -Imdur 30 mg daily (hold) -Metoprolol 25 mg BID (hold) -Metoprolol IV 5 mg TID -Hydralazine 5 mg TID -2/7 hydralazine IV PRN  Paroxysmal atrial fibrillation -Currently NSR -See cardiomyopathy  Essential HTN -See cardiomyopathy  Prolonged QT interval -QT interval this a.m.> 500  HLD -Rosuvastatin 5 mg daily   AKI/Solitary kidney Recent Labs  Lab 07-11-19 1240 06/26/19 0015 07-13-19 0331  CREATININE 1.39* 1.12 0.99   Alzheimer's dementia -Aricept 10 mg daily -Namenda 28 mg daily  Agitation -Minimal use Ativan.   -Discussed case with Brad [name] palliative care, family has decided to make patient comfort care  Dysphagia -2/6 swallow evaluation pending  Malnutrition -Patient currently unresponsive not taking a.m. food or fluids. -2/7 decrease/D5-0.9% saline KVO; concern for fluid overload -Discussed case with Brad [name] palliative care, family has decided to make patient comfort care  Hyperglycemia -Moderate SSI  Anemia -Anemia panel pending  GI bleed -Fecal occult positive    Goals of care -2/6 palliative care consult; discussed with family hospice, as they want to continue comfort feeds even though patient aspirating.  Need to understand SHOULD NOT return patient to hospital/doctors when the inevitable pneumonia develops. -2/7 discussed case with Brad [name] Palliative Care, family has decided to make patient comfort care     History of present illness:  86 y.o.WM PMHx  Dilated Cardiomyopathy, PAF on Eliquis; HTN, CAD s/p stent, s/ppacemaker placement; s/p AAA repair, macular degeneration; HLD,GERD; and COPD   who was hospitalized last month 1/16-1/19 for acute hypoxic respiratory  failure and treated for COPD/CHF exacerbation and discharged home without any home O2 needs, now presents in concern for cognitive/functionaldecline with worseningconfusion.Family states he is gotten weaker and somewhat dyspneic on walking. This morning he was trying to walk with a walker and his legs gave away which prompted ED visit. Per last discharge summary, son had reported noticing choking whilepatienthaving breakfast and dinner, speech therapy eval was obtained who recommended dysphagia diet upon discharge. Hospital course was complicated by episodes of delirium/sundowning-responded well to conservative measures.Patient has been living with his son, Brad [name]. Patient tested negative for Covid in last admission buthis otherson, Brad [name], tooke recently recovered from [place] visiting him occasionally.According to Brad [name], patient followed up with PCP regarding dysphagia diet recommendations as he was not happy with using thickener in liquids. He had speech therapy follow-up as outpatient who apparently advised regular diet consistency with small bites and small sips of thin liquids. Patient however has been using meds and pured diet.   Hospital Course:  Despite aggressive treatment with Covid protocol patient showed no significant improvement in fact began to slowly decline.  After discussion with family decided to make patient comfort care.   Time: 1802 Signed:  Dia Crawford, MD [facility] [phone]

## 2019-07-19 NOTE — Progress Notes (Signed)
No family will be able to visit. Family is aware facetime is an option.

## 2019-07-19 NOTE — Progress Notes (Addendum)
PROGRESS NOTE    Brad [name]  [id number] DOB: 03-28-1928 DOA: 07/01/2019 PCP: [name], MD   Brief Narrative:  86 y.o. WM PMHx Dilated Cardiomyopathy, PAF on Eliquis; HTN, CAD s/p stent, s/ppacemaker placement; s/p AAA repair, macular degeneration; HLD,GERD;  and COPD   who was hospitalized last month 1/16-1/19 for acute hypoxic respiratory failure and treated for COPD/CHF exacerbation and discharged home without any home O2 needs, now presents in concern for cognitive/functional decline with worsening confusion.  Family states he is gotten weaker and somewhat dyspneic on walking.  This morning he was trying to walk with a walker and his legs gave away which prompted ED visit.  Per last discharge summary, son had reported noticing choking while patient having breakfast and dinner, speech therapy eval was obtained who recommended dysphagia diet upon discharge.  Hospital course was complicated by episodes of delirium/sundowning-responded well to conservative measures.  Patient has been living with his son, Brad [name]. Patient tested negative for Covid in last admission but his other son, Brad [name], who had recently recovered from Covid was visiting him occasionally.  According to Brad [name], patient followed up with PCP regarding dysphagia diet recommendations as he was not happy with using thickener in liquids.  He had speech therapy follow-up as outpatient who apparently advised regular diet consistency with small bites and small sips of thin liquids.  Patient however has been using meds and pured diet.     Subjective: 2/7 A/O x1 (does not know where, when, why).  Pleasantly confused.   Assessment & Plan:   Principal Problem:   Acute respiratory failure with hypoxia (HCC) Active Problems:   Essential hypertension   Aneurysm of abdominal vessel (HCC)   PVD   GERD   COPD (chronic obstructive pulmonary disease) (HCC)   BPH (benign prostatic hyperplasia)   Mild dementia ([place])   Dementia due to  Alzheimer's disease ([name])   Pacemaker   Chronic right-sided CHF (congestive heart failure) ([place])   Sepsis ([place])   Bilateral pneumonia   AKI (acute kidney injury) ([place])   Solitary kidney   Dilated cardiomyopathy ([place])  Covid pneumonia/acute respiratory failure with hypoxia -On admission does not meet criteria for sepsis COVID-19 Labs  Recent Labs    07/11/2019 1900 06/26/19 0015 06/26/19 0855 2019/07/07 0331  DDIMER 5.07* 8.90*  --  >20.00*  FERRITIN  --  504* 427* 300  CRP  --  22.7*  --  15.0*    Lab Results  Component Value Date   SARSCOV2NAA POSITIVE (A) 07/03/2019   SARSCOV2NAA NEGATIVE 06/05/2019   [place] Not Detected 06/02/2019   SARSCOV2NAA NEGATIVE 12/13/2018   -Decadron 6 mg daily -Remdesivir per pharmacy protocol -Combivent -Vitamins per Covid protocol -Flutter valve -Incentive spirometry -Titrate to maintain SPO2> 88% -Prone 16 hours/day; if cannot tolerate prone 2 to 3 hours per shift -Discussed case with Dr. Loistine Chance palliative care, family has decided to make patient comfort care  HCAP/Aspiration pneumonia -Trend procalcitonin Results for CAYSON, WEISENBERGER (MRN [id number]) as of 06/26/2019 14:37  Ref. Range 07/06/2019 17:06 06/26/2019 08:55  Procalcitonin Latest Units: ng/mL 0.69 0.40  -Initiate antibiotics.  Will complete 5-day course unless family decides to make patient comfort care. -comfort care  COPD -See Covid pneumonia  Lactic acidosis -Trend  lactic acid -2/7 decrease D5-0.9% saline KVO  Dilated Cardiomyopathy -S/p pacemaker left chest wall -Strict in and out +1.2 L -Daily weight Filed Weights   06/26/2019 1209  Weight: 77.6 kg  -Continue Apixaban 5 mg BID -Imdur 30  mg daily (hold) -Metoprolol 25 mg BID (hold) -Metoprolol IV 5 mg TID -Hydralazine 5 mg TID -2/7 hydralazine IV PRN  Paroxysmal atrial fibrillation -Currently NSR -See cardiomyopathy  Essential HTN -See cardiomyopathy  Prolonged QT interval -QT interval this  a.m.> 500  HLD -Rosuvastatin 5 mg daily   AKI/Solitary kidney Recent Labs  Lab 07/12/2019 1240 06/26/19 0015 07-22-19 0331  CREATININE 1.39* 1.12 0.99    Alzheimer's dementia -Aricept 10 mg daily -Namenda 28 mg daily  Agitation -Minimal use Ativan.   -Discussed case with Dr. Loistine Chance palliative care, family has decided to make patient comfort care  Dysphagia -2/6 swallow evaluation pending  Malnutrition -Patient currently unresponsive not taking a.m. food or fluids. -2/7 decrease/D5-0.9% saline KVO; concern for fluid overload -Discussed case with Dr. Loistine Chance palliative care, family has decided to make patient comfort care  Hyperglycemia -Moderate SSI  Anemia -Anemia panel pending Recent Labs  Lab 06/28/2019 1240 07/10/2019 1246 06/26/19 0015 07-22-19 0331  HGB 10.6* 10.5* 10.8* 11.1*  -Overall fecal occult positive hemoglobin stable  GI bleed -Fecal occult positive -    Goals of care -2/6 palliative care consult; discussed with family hospice, as they want to continue comfort feeds even though patient aspirating.  Need to understand SHOULD NOT return patient to hospital/doctors when the inevitable pneumonia develops. -2/7 discussed case with Dr. Loistine Chance Palliative Care, family has decided to make patient comfort care   DVT prophylaxis: Apixaban Code Status: DNR Family Communication: 2/7 Dr Loistine Chance palliative care and I spoke with Brad [name] (Son) counseled on plan of care, answered all questions.   Disposition Plan: TBD   Consultants:  Palliative care   Procedures/Significant Events:  2/4 PCXR-:Increased bilateral RUL/LLL opacities are noted concerning for worsening pneumonia.    I have personally reviewed and interpreted all radiology studies and my findings are as above.  VENTILATOR SETTINGS: HFNC + NRB 2/7 Flow; 15 L/min SPO2 85%   Cultures     Antimicrobials: Anti-infectives (From admission, onward)   Start     Dose/Rate Stop    07-22-2019 1300  levofloxacin (LEVAQUIN) IVPB 750 mg  Status:  Discontinued     750 mg 100 mL/hr over 90 Minutes 06/24/2019 1616   06/26/19 1000  remdesivir 100 mg in sodium chloride 0.9 % 100 mL IVPB     100 mg 200 mL/hr over 30 Minutes 06/30/19 0959   06/21/2019 1800  ampicillin-sulbactam (UNASYN) 1.5 g in sodium chloride 0.9 % 100 mL IVPB  Status:  Discontinued     1.5 g 200 mL/hr over 30 Minutes 07/10/2019 1753   06/26/2019 1715  remdesivir 200 mg in sodium chloride 0.9% 250 mL IVPB     200 mg 580 mL/hr over 30 Minutes 06/28/2019 1959   07/17/2019 1245  levofloxacin (LEVAQUIN) IVPB 750 mg     750 mg 100 mL/hr over 90 Minutes 07/09/2019 1455      Devices    LINES / TUBES:      Continuous Infusions: . ceFEPime (MAXIPIME) IV 2 g (Jul 22, 2019 0324)  . dextrose 5 % and 0.9% NaCl 100 mL/hr at 2019-07-22 0230  . remdesivir 100 mg in NS 100 mL 100 mg (06/26/19 1227)  . vancomycin 1,000 mg (06/26/19 1659)     Objective: Vitals:   2019-07-22 0611 22-Jul-2019 0630 07-22-2019 0649 2019/07/22 0750  BP: (!) 142/82 (!) 139/108 (!) 142/65 (!) 150/102  Pulse: 93 98 74 (!) 109  Resp: (!) 23 (!) 23 19 (!) 23  Temp:    Marland Kitchen)  93.1 F (33.9 C)  TempSrc:    Axillary  SpO2: (!) 89% 92% 91% 90%  Weight:      Height:        Intake/Output Summary (Last 24 hours) at Jul 15, 2019 0800 Last data filed at 06/26/2019 1800 Gross per 24 hour  Intake 748.39 ml  Output 600 ml  Net 148.39 ml   Filed Weights   06/26/2019 1209  Weight: 77.6 kg   Physical Exam:  General: A/O x1 (does not know where, when, why), pleasantly confused, positive acute respiratory distress Eyes: negative scleral hemorrhage, negative anisocoria, negative icterus ENT: Negative Runny nose, negative gingival bleeding, Neck:  Negative scars, masses, torticollis, lymphadenopathy, JVD Lungs: Clear to auscultation bilaterally without wheezes or crackles Cardiovascular: Regular rate and rhythm (paced) without murmur gallop or rub normal S1 and  S2 Abdomen: negative abdominal pain, nondistended, positive soft, bowel sounds, no rebound, no ascites, no appreciable mass Extremities: No significant cyanosis, clubbing, or edema bilateral lower extremities Skin: Negative rashes, lesions, ulcers Psychiatric: Pleasantly confused Central nervous system:  Cranial nerves II through XII intact, tongue/uvula midline, all extremities muscle strength 5/5, sensation intact throughout, negative dysarthria, negative expressive aphasia, negative receptive aphasia. .     Data Reviewed: Care during the described time interval was provided by me .  I have reviewed this patient's available data, including medical history, events of note, physical examination, and all test results as part of my evaluation.   CBC: Recent Labs  Lab 07/15/2019 1240 07/02/2019 1246 06/26/19 0015 07-15-19 0331  WBC 5.5  --  7.3 6.3  NEUTROABS 4.7  --  6.3 5.3  HGB 10.6* 10.5* 10.8* 11.1*  HCT 34.0* 31.0* 36.7* 37.3*  MCV 98.3  --  99.2 98.4  PLT 181  --  200 [id number]   Basic Metabolic Panel: Recent Labs  Lab 07/18/2019 1240 07/07/2019 1246 06/26/19 0015 06/26/19 0855 07/15/2019 0331  NA 143 143 141  --  146*  K 5.7* 4.5 4.8  --  4.6  CL 109  --  108  --  111  CO2 20*  --  22  --  26  GLUCOSE 123*  --  154*  --  268*  BUN 53*  --  50*  --  42*  CREATININE 1.39*  --  1.12  --  0.99  CALCIUM 8.2*  --  8.3*  --  8.5*  MG  --   --  2.3  --  2.5*  PHOS  --   --   --  4.2 3.0   GFR: Estimated Creatinine Clearance: 48.6 mL/min (by C-G formula based on SCr of 0.99 mg/dL). Liver Function Tests: Recent Labs  Lab 07/15/2019 1240 06/26/19 0015 15-Jul-2019 0331  AST 143* 80* 34  ALT 88* 82* 60*  ALKPHOS 170* 210* 186*  BILITOT 1.7* 0.7 0.9  PROT 5.8* 6.4* 6.0*  ALBUMIN 2.3* 2.6* 2.5*   No results for input(s): LIPASE, AMYLASE in the last 168 hours. No results for input(s): AMMONIA in the last 168 hours. Coagulation Profile: Recent Labs  Lab 07/04/2019 1345  INR 2.6*    Cardiac Enzymes: No results for input(s): CKTOTAL, CKMB, CKMBINDEX, TROPONINI in the last 168 hours. BNP (last 3 results) No results for input(s): PROBNP in the last 8760 hours. HbA1C: No results for input(s): HGBA1C in the last 72 hours. CBG: No results for input(s): GLUCAP in the last 168 hours. Lipid Profile: No results for input(s): CHOL, HDL, LDLCALC, TRIG, CHOLHDL, LDLDIRECT in the last 72 hours.  Thyroid Function Tests: No results for input(s): TSH, T4TOTAL, FREET4, T3FREE, THYROIDAB in the last 72 hours. Anemia Panel: Recent Labs    06/26/19 0015 06/26/19 0855  VITAMINB12  --  975*  FOLATE  --  15.7  FERRITIN 504* 427*  TIBC  --  292  IRON  --  54  RETICCTPCT  --  0.6   Urine analysis:    Component Value Date/Time   COLORURINE AMBER (A) 06/26/2019 1513   APPEARANCEUR CLOUDY (A) 06/24/2019 1513   APPEARANCEUR Clear 04/29/2019 1401   LABSPEC 1.027 07/18/2019 1513   PHURINE 5.0 07/08/2019 1513   GLUCOSEU NEGATIVE 06/30/2019 1513   HGBUR NEGATIVE 07/05/2019 1513   BILIRUBINUR NEGATIVE 06/22/2019 1513   BILIRUBINUR Negative 04/29/2019 1401   KETONESUR NEGATIVE 07/16/2019 1513   PROTEINUR 100 (A) 07/14/2019 1513   UROBILINOGEN negative 07/07/2015 1054   NITRITE NEGATIVE 07/11/2019 1513   LEUKOCYTESUR MODERATE (A) 07/02/2019 1513   Sepsis Labs: @LABRCNTIP (procalcitonin:4,lacticidven:4)  ) Recent Results (from the past 240 hour(s))  Blood Culture (routine x 2)     Status: None (Preliminary result)   Collection Time: 06/30/2019 12:15 PM   Specimen: BLOOD LEFT FOREARM  Result Value Ref Range Status   Specimen Description BLOOD LEFT FOREARM  Final   Special Requests   Final    BOTTLES DRAWN AEROBIC AND ANAEROBIC Blood Culture adequate volume   Culture   Final    NO GROWTH 2 DAYS Performed at [facility], [place] [address] [place], [place] [address]    Report Status PENDING  Incomplete  Blood Culture (routine x 2)     Status: None (Preliminary result)    Collection Time: 06/24/2019 12:35 PM   Specimen: BLOOD RIGHT FOREARM  Result Value Ref Range Status   Specimen Description BLOOD RIGHT FOREARM  Final   Special Requests   Final    BOTTLES DRAWN AEROBIC AND ANAEROBIC Blood Culture results may not be optimal due to an inadequate volume of blood received in culture bottles   Culture   Final    NO GROWTH 2 DAYS Performed at [facility], [place] [address], [place], [place] [address]    Report Status PENDING  Incomplete  Respiratory Panel by RT PCR (Flu A&B, Covid) - Nasopharyngeal Swab     Status: Abnormal   Collection Time: 07/09/2019 12:40 PM   Specimen: Nasopharyngeal Swab  Result Value Ref Range Status   SARS Coronavirus 2 by RT PCR POSITIVE (A) NEGATIVE Final    Comment: RESULT CALLED TO, READ BACK BY AND VERIFIED WITH: Angelina Ok RN 15:20 07/01/2019 ([email or web address]) (NOTE) SARS-CoV-2 target nucleic acids are DETECTED. SARS-CoV-2 RNA is generally detectable in upper respiratory specimens  during the acute phase of infection. Positive results are indicative of the presence of the identified virus, but do not rule out bacterial infection or co-infection with other pathogens not detected by the test. Clinical correlation with patient history and other diagnostic information is necessary to determine patient infection status. The expected result is Negative. Fact Sheet for Patients:  PinkCheek.be Fact Sheet for Healthcare Providers: GravelBags.it This test is not yet approved or cleared by the [place] FDA and  has been authorized for detection and/or diagnosis of SARS-CoV-2 by FDA under an Emergency Use Authorization (EUA).  This EUA will remain in effect (meaning this test can be  used) for the duration of  the COVID-19 declaration under Section 564(b)(1) of the Act, 21 U.S.C. section 360bbb-3(b)(1), unless the authorization is terminated  or revoked sooner.    Influenza A  by PCR NEGATIVE NEGATIVE Final   Influenza B by PCR NEGATIVE NEGATIVE Final    Comment: (NOTE) The Xpert Xpress SARS-CoV-2/FLU/RSV assay is intended as an aid in  the diagnosis of influenza from Nasopharyngeal swab specimens and  should not be used as a sole basis for treatment. Nasal washings and  aspirates are unacceptable for Xpert Xpress SARS-CoV-2/FLU/RSV  testing. Fact Sheet for Patients: PinkCheek.be Fact Sheet for Healthcare Providers: GravelBags.it This test is not yet approved or cleared by the [place] FDA and  has been authorized for detection and/or diagnosis of SARS-CoV-2 by  FDA under an Emergency Use Authorization (EUA). This EUA will remain  in effect (meaning this test can be used) for the duration of the  Covid-19 declaration under Section 564(b)(1) of the Act, 21  U.S.C. section 360bbb-3(b)(1), unless the authorization is  terminated or revoked. Performed at [facility], [place] [address] [place], [place] [address]   Urine Culture     Status: Abnormal   Collection Time: 07/05/2019  3:13 PM   Specimen: Urine, Random  Result Value Ref Range Status   Specimen Description URINE, RANDOM  Final   Special Requests   Final    NONE Performed at [facility], [place] [address], [place], [place] [address]    Culture MULTIPLE SPECIES PRESENT, SUGGEST RECOLLECTION (A)  Final   Report Status 06/26/2019 FINAL  Final  MRSA PCR Screening     Status: None   Collection Time: 06/26/19  5:21 PM   Specimen: Nasal Mucosa; Nasopharyngeal  Result Value Ref Range Status   MRSA by PCR NEGATIVE NEGATIVE Final    Comment:        The GeneXpert MRSA Assay (FDA approved for NASAL specimens only), is one component of a comprehensive MRSA colonization surveillance program. It is not intended to diagnose MRSA infection nor to guide or monitor treatment for MRSA infections. Performed at [facility],  [place] [address], [place],  [address]          Radiology Studies: [facility] Chest Port 1 View  Result Date: 07/11/2019 CLINICAL DATA:  Hypoxia. Shortness of breath. EXAM: PORTABLE CHEST 1 VIEW COMPARISON:  June 08, 2019. FINDINGS: Stable cardiomegaly. Atherosclerosis of thoracic aorta is noted. Left-sided pacemaker is unchanged in position. Increased bilateral right upper lobe and left lower lobe opacities are noted concerning for worsening pneumonia. No pneumothorax or pleural effusion is noted. Bony thorax is unremarkable. IMPRESSION: Worsening bilateral right upper lobe and left lower lobe opacities are noted concerning for worsening pneumonia. Aortic Atherosclerosis (ICD10-I70.0). Electronically Signed   By: Marijo Conception M.D.   On: 07/15/2019 12:26        Scheduled Meds: . apixaban  5 mg Oral BID  . vitamin C  500 mg Oral Daily  . dexamethasone (DECADRON) injection  6 mg Intravenous Daily  . donepezil  10 mg Oral Daily  . finasteride  5 mg Oral Daily  . fluticasone furoate-vilanterol  1 puff Inhalation Daily  . Ipratropium-Albuterol  1 puff Inhalation Q6H  . isosorbide mononitrate  30 mg Oral Daily  . loratadine  10 mg Oral Daily  . memantine  28 mg Oral Q supper  . metoprolol tartrate  25 mg Oral BID  . potassium chloride SA  20 mEq Oral Daily  . rosuvastatin  5 mg Oral Q supper  . tamsulosin  0.8 mg Oral QHS  . zinc sulfate  220 mg Oral Daily   Continuous Infusions: . ceFEPime (MAXIPIME) IV 2 g (07/06/19 0324)  . dextrose 5 % and 0.9% NaCl 100 mL/hr at 2019-07-06 0230  . remdesivir 100 mg in NS 100 mL 100 mg (06/26/19 1227)  . vancomycin 1,000 mg (06/26/19 1659)     LOS: 2 days   The patient is critically ill with multiple organ systems failure and requires high complexity decision making for assessment and support, frequent evaluation and titration of therapies, application of advanced monitoring technologies and extensive interpretation of multiple databases.  Critical Care Time devoted to patient care services described in this note  Time spent: 40 minutes     Jakayla Schweppe, Geraldo Docker, MD [facility] Pager [phone]  If 7PM-7AM, please contact night-coverage [email or web address] Password [email or web address] 06-Jul-2019, 8:00 AM

## 2019-07-19 NOTE — Progress Notes (Signed)
Daily Progress Note   Patient Name: Brad [name]       Date: 30-Jun-2019 DOB: 12-15-1927  Age: 86 y.o. MRN#: [id number] Attending Physician: Allie Bossier, MD Primary Care Physician: [name], MD Admit Date: 07/16/2019  Reason for Consultation/Follow-up: Establishing goals of care  Subjective: Chart reviewed, overnight events noted, call placed and discussed with bedside RN.  Appreciate her input and excellent care of the patient.  Call placed and discussed with son [name].  Also discussed with TRH MD, see below.  Length of Stay: 2  Current Medications: Scheduled Meds:   apixaban  5 mg Oral BID   vitamin C  500 mg Oral Daily   dexamethasone (DECADRON) injection  6 mg Intravenous Daily   donepezil  10 mg Oral Daily   finasteride  5 mg Oral Daily   fluticasone furoate-vilanterol  1 puff Inhalation Daily   hydrALAZINE  5 mg Intravenous Q8H   insulin aspart  0-15 Units Subcutaneous Q4H   Ipratropium-Albuterol  1 puff Inhalation Q6H   loratadine  10 mg Oral Daily   LORazepam  0.5 mg Intravenous Q4H   memantine  28 mg Oral Q supper   metoprolol tartrate  5 mg Intravenous Q8H   potassium chloride SA  20 mEq Oral Daily   rosuvastatin  5 mg Oral Q supper   tamsulosin  0.8 mg Oral QHS   zinc sulfate  220 mg Oral Daily    Continuous Infusions:  ceFEPime (MAXIPIME) IV 2 g (06/30/2019 0324)   dextrose 5 % and 0.9% NaCl 10 mL/hr at 06-30-19 0803   remdesivir 100 mg in NS 100 mL 100 mg (June 30, 2019 1102)   vancomycin 1,000 mg (06/26/19 1659)    PRN Meds: acetaminophen, bisacodyl, chlorpheniramine-HYDROcodone, fluticasone, guaiFENesin-dextromethorphan, hydrALAZINE, Ipratropium-Albuterol, labetalol, LORazepam, OLANZapine zydis, ondansetron, polyethylene glycol, Resource  ThickenUp Clear  Physical Exam         The above conversation was completed via telephone due to the visitor restrictions during the COVID-19 pandemic. Thorough chart review and discussion with necessary members of the care team was completed as part of assessment. All issues were discussed and addressed but no physical exam was performed.  At the time of my phone call to patient's bedside RN, she was in the patient's room.  Patient was heard in the background being agitated  and uncomfortable.  It was reported that he is trying to get up out of bed, trying to take his oxygen off.  Appears uncomfortable.  Vital Signs: BP (!) 138/92    Pulse (!) 36    Temp (!) 97.4 F (36.3 C) (Axillary)    Resp (!) 26    Ht 5\' 6"  (1.676 m)    Wt 77.6 kg    SpO2 (!) 88%    BMI 27.61 kg/m  SpO2: SpO2: (!) 88 % O2 Device: O2 Device: High Flow Nasal Cannula, NRB O2 Flow Rate: O2 Flow Rate (L/min): 15 L/min  Intake/output summary:   Intake/Output Summary (Last 24 hours) at 22-Jul-2019 1148 Last data filed at 06/26/2019 1800 Gross per 24 hour  Intake 748.39 ml  Output 600 ml  Net 148.39 ml   LBM:   Baseline Weight: Weight: 77.6 kg Most recent weight: Weight: 77.6 kg       Palliative Assessment/Data:      Patient Active Problem List   Diagnosis Date Noted   Dilated cardiomyopathy ([place]) 06/26/2019   Chronic right-sided CHF (congestive heart failure) ([place]) 07/08/2019   Acute respiratory failure with hypoxia ([facility]) 07/14/2019   Sepsis ([place]) 07/15/2019   Bilateral pneumonia 07/05/2019   AKI (acute kidney injury) ([place]) 07/14/2019   Solitary kidney 07/02/2019   Dysphagia 06/08/2019   Acute right-sided CHF (congestive heart failure) ([place]) 06/08/2019   Balanitis 06/05/2019   DNR (do not resuscitate) 06/05/2019   Pacemaker 02/11/2019   Lower GI bleeding 12/13/2018   Dementia due to Alzheimer's disease ([place]) 10/02/2018   Blood in urine 12/04/2016   Mild dementia ([place]) 07/03/2016   Atrial  fibrillation ([place]) 11/21/2015   Sinus node dysfunction (H[facility]) 11/20/2015   BPH (benign prostatic hyperplasia) 07/07/2015   Hyperlipemia 07/07/2015   Ventral hernia 05/31/2013   Carpal tunnel syndrome, bilateral 05/07/2011   Ethanolism ([place])    COPD (chronic obstructive pulmonary disease) ([place])    ADENOMATOUS COLONIC POLYP 04/18/2008   Essential hypertension 04/18/2008   Coronary atherosclerosis 04/18/2008   Aneurysm of abdominal vessel ([place]) 04/18/2008   PVD 04/18/2008   GERD 04/18/2008   DIVERTICULOSIS, COLON 04/18/2008    Palliative Care Assessment & Plan   Patient Profile:  Brad [name] a 86 y.o.malewith history h/oPAF on Eliquis; macular degeneration; HTN; HLD,GERD; CAD s/p stent, s/ppacemaker placement; s/p AAA repairand COPD who was hospitalized last month 1/16-1/19 for acute hypoxic respiratory failure and treated for COPD/CHF exacerbation and discharged home without any home O2 needs admitted on 06/24/2019 with cognitive/functionaldecline with worseningconfusion, dyspnea, PNA COVID+.   Patient has a pacemaker, patient has a history of dementia patient has a history of dilated cardiomyopathy and chronic right-sided heart failure.  Assessment: Generalized deconditioning Not able to tolerate any p.o. diet Agitation/confusion Healthcare associated pneumonia/aspiration pneumonia Covid pneumonia, acute hypoxic respiratory failure   Recommendations/Plan: Call placed and discussed again with [name].  We reviewed about the patient's current condition.  We discussed about aggressive symptom management, liberalizing use of benzodiazepines for pursuing comfort.  We discussed about residential hospice eligibility and guidelines in detail.  Son Brad [name] is agreeable. Add scheduled Ativan, increased dose of Zyprexa.  Patient also on as needed Ativan.  Continue to maximize comfort measures. We will request transitions of care consult for proceeding with residential hospice  facility transfer towards the end of this hospitalization.  Code Status:    Code Status Orders  (From admission, onward)         Start     Ordered  06/21/2019 1706  Do not attempt resuscitation (DNR)  Continuous    Question Answer Comment  In the event of cardiac or respiratory ARREST Do not call a code blue   In the event of cardiac or respiratory ARREST Do not perform Intubation, CPR, defibrillation or ACLS   In the event of cardiac or respiratory ARREST Use medication by any route, position, wound care, and other measures to relive pain and suffering. May use oxygen, suction and manual treatment of airway obstruction as needed for comfort.      07/14/2019 1708        Code Status History    Date Active Date Inactive Code Status Order ID Comments User Context   06/05/2019 1506 06/08/2019 2353 DNR [id number]  Karmen Bongo, MD ED   12/13/2018 1127 12/16/2018 1352 DNR [id number]  Karmen Bongo, MD ED   11/20/2015 1639 11/22/2015 1316 Full Code [id number]  Evans Lance, MD Inpatient   12/26/2014 1147 12/26/2014 1700 Full Code [id number]  Milly Jakob, MD Inpatient   Advance Care Planning Activity       Prognosis:   < 2 weeks  Discharge Planning:  Hospice facility  Care plan was discussed with patient's bedside RN, patient son Rica Mast, [facility] MD Dr. Sherral Hammers.   Thank you for allowing the Palliative Medicine Team to assist in the care of this patient.   Time In: 11 Time Out: 11.25 Total Time 25 Prolonged Time Billed  no       Greater than 50%  of this time was spent counseling and coordinating care related to the above assessment and plan.  Loistine Chance, MD  Please contact Palliative Medicine Team phone at [phone] for questions and concerns.

## 2019-07-19 NOTE — Progress Notes (Signed)
Contacted on-call physician about patients elevated blood pressure. New orders received for labetalol. Warrick Parisian, RN

## 2019-07-19 NOTE — Progress Notes (Signed)
Patient extremely restless overnight. Remains on 15 L HFNC and Non-rebreather, O2 saturations fluctuating from low 80's to mid 90's, heart rate fluctuating from 80's to 120's. Patient continues to wear protective mittens to keep from removing IV's and O2. Patient does not appear to be in distress and work of breathing increases at times when he is agitated. Patient pleasantly confused and hyperverbal. Warrick Parisian, RN

## 2019-07-19 NOTE — Progress Notes (Signed)
   Vital Signs MEWS/VS Documentation      07/21/2019 0630 21-Jul-2019 0649 07-21-2019 0657 2019/07/21 0750   MEWS Score:  4  1  4  5    MEWS Score Color:  Red  Green  Red  Red   Resp:  (!) 23  19  --  (!) 23   Pulse:  98  74  --  (!) 109   BP:  (!) 139/108  (!) 142/65  --  (!) 150/102   Temp:  --  --  --  (!) 93.1 F (33.9 C)   O2 Device:  HFNC;Non-rebreather Mask  HFNC;Non-rebreather Mask  --  --   O2 Flow Rate (L/min):  15 L/min  15 L/min  --  --   Level of Consciousness:  Alert  Alert  --  Alert       MD paged, orders to KVO fluids, PRN hydralazine added, bair hugger placed on patient. Will follow RED MEWS protocol    Brad [name] 07/21/19,8:25 AM

## 2019-07-19 DEATH — deceased

## 2019-08-26 ENCOUNTER — Ambulatory Visit: Payer: Medicare Other | Admitting: Neurology

## 2019-09-06 ENCOUNTER — Ambulatory Visit: Payer: Medicare Other | Admitting: Neurology

## 2021-04-29 IMAGING — DX DG CHEST 1V PORT
1 series · 1 of 1 positions shown · non-contrast
Comparison: February 06, 2016.

CLINICAL DATA: Shortness of breath.

EXAM:
PORTABLE CHEST 1 VIEW

[chest]
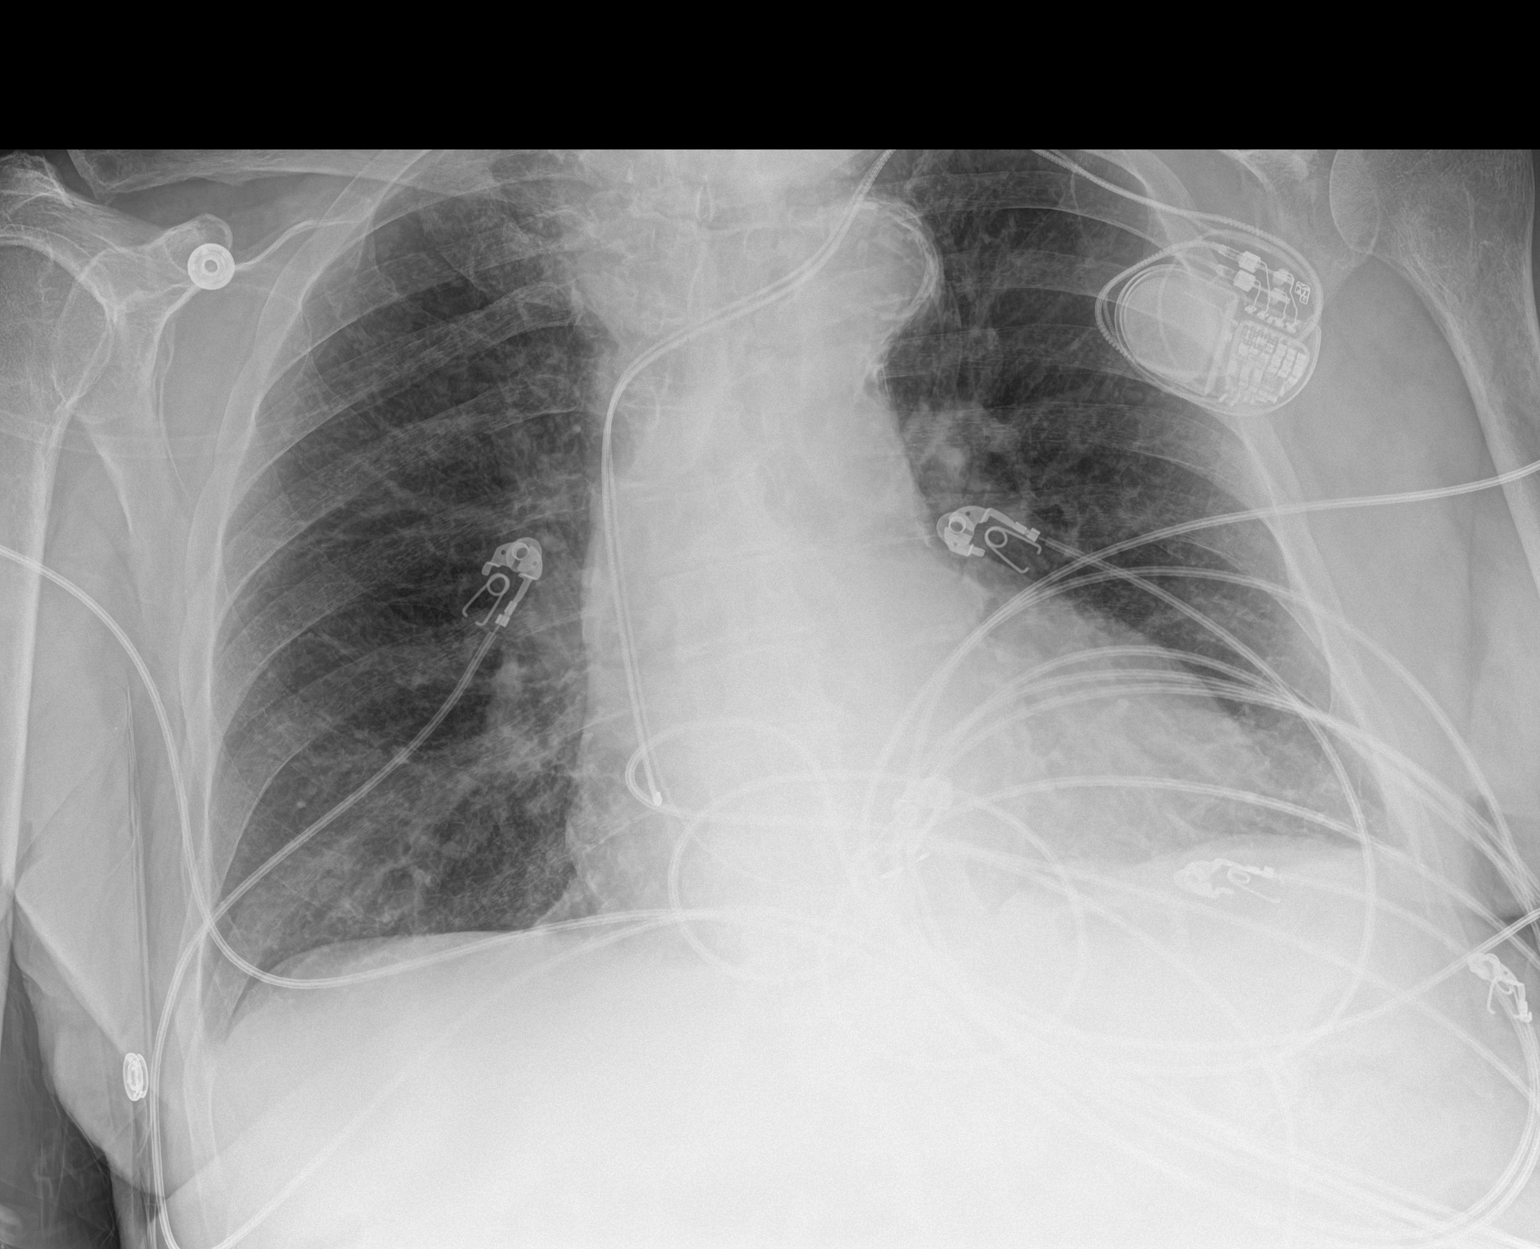

[1 of 1 positions shown; findings below may reference images not displayed]

FINDINGS: Stable cardiomediastinal silhouette. Atherosclerosis of thoracic
aorta is noted. Left-sided pacemaker is unchanged in position. No
pneumothorax or pleural effusion is noted. Minimal bibasilar
subsegmental atelectasis is noted. Bony thorax is unremarkable.
IMPRESSION: Minimal bibasilar subsegmental atelectasis.

Aortic Atherosclerosis ([id number]).

## 2021-05-02 IMAGING — DX DG CHEST 1V PORT
1 series · 1 of 1 positions shown · non-contrast
Comparison: 06/05/2019

CLINICAL DATA: Hypoxia.

EXAM:
PORTABLE CHEST 1 VIEW

[chest]
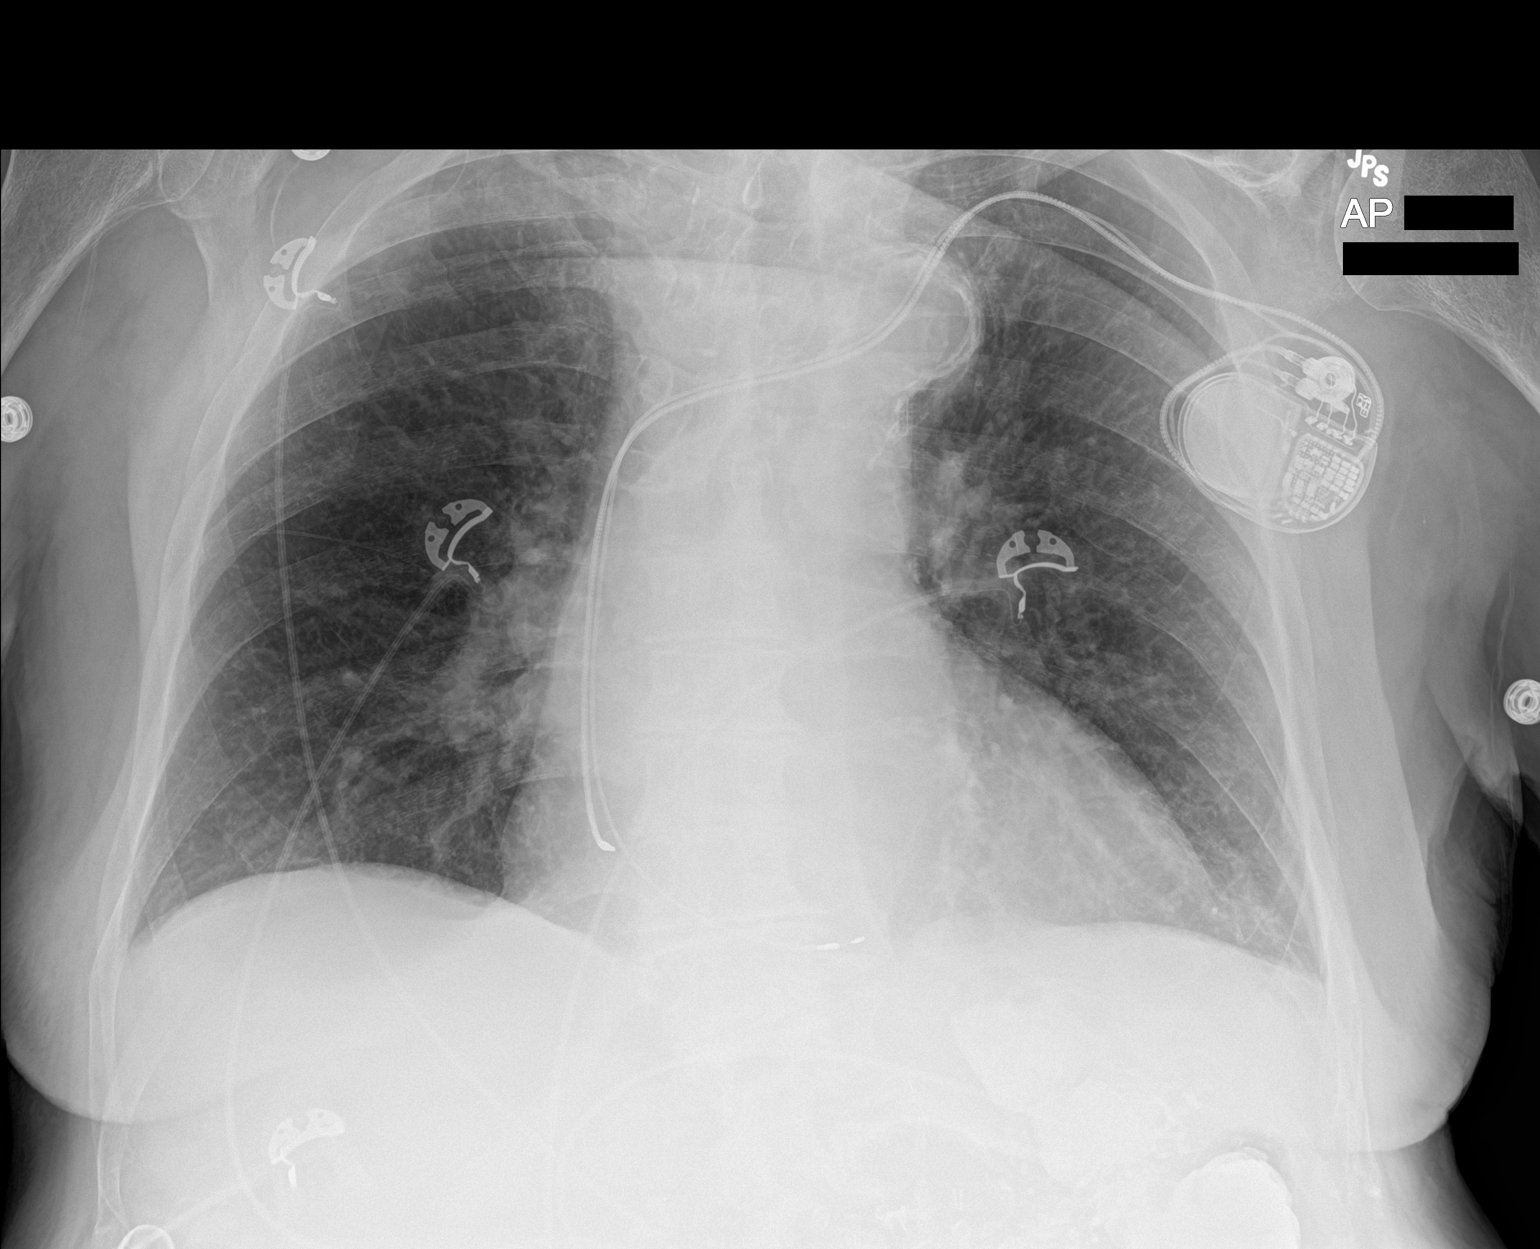

[1 of 1 positions shown; findings below may reference images not displayed]

FINDINGS: Left chest wall pacer device noted with leads in the right atrial
appendage and right ventricle. Mild cardiac enlargement. Aortic
atherosclerosis. Decreased lung volumes. No interstitial edema,
pleural effusion or airspace opacity.
IMPRESSION: Low lung volumes.  No acute abnormality.
